# Patient Record
Sex: Female | Born: 1935
Health system: Southern US, Community
[De-identification: ages and names within clinical notes are randomized; demographics above are authoritative.]

## PROBLEM LIST (undated history)

## (undated) DIAGNOSIS — I10 Essential (primary) hypertension: Secondary | ICD-10-CM

## (undated) DIAGNOSIS — R32 Unspecified urinary incontinence: Secondary | ICD-10-CM

## (undated) DIAGNOSIS — C9110 Chronic lymphocytic leukemia of B-cell type not having achieved remission: Secondary | ICD-10-CM

## (undated) DIAGNOSIS — N183 Chronic kidney disease, stage 3 unspecified: Secondary | ICD-10-CM

## (undated) DIAGNOSIS — IMO0002 Reserved for concepts with insufficient information to code with codable children: Secondary | ICD-10-CM

## (undated) DIAGNOSIS — K08109 Complete loss of teeth, unspecified cause, unspecified class: Secondary | ICD-10-CM

## (undated) DIAGNOSIS — Z7901 Long term (current) use of anticoagulants: Secondary | ICD-10-CM

## (undated) DIAGNOSIS — M1990 Unspecified osteoarthritis, unspecified site: Secondary | ICD-10-CM

## (undated) DIAGNOSIS — N816 Rectocele: Secondary | ICD-10-CM

## (undated) DIAGNOSIS — Z8711 Personal history of peptic ulcer disease: Secondary | ICD-10-CM

## (undated) DIAGNOSIS — N952 Postmenopausal atrophic vaginitis: Secondary | ICD-10-CM

## (undated) DIAGNOSIS — Z972 Presence of dental prosthetic device (complete) (partial): Secondary | ICD-10-CM

## (undated) HISTORY — DX: Unspecified osteoarthritis, unspecified site: M19.90

## (undated) HISTORY — DX: Unspecified urinary incontinence: R32

## (undated) HISTORY — DX: Chronic lymphocytic leukemia of B-cell type not having achieved remission: C91.10

## (undated) HISTORY — PX: APPENDECTOMY: SHX54

## (undated) HISTORY — PX: TONSILLECTOMY AND ADENOIDECTOMY: SUR1326

## (undated) HISTORY — DX: Essential (primary) hypertension: I10

## (undated) HISTORY — DX: Reserved for concepts with insufficient information to code with codable children: IMO0002

---

## 2007-09-22 DIAGNOSIS — D75839 Thrombocytosis, unspecified: Secondary | ICD-10-CM

## 2007-09-22 DIAGNOSIS — D72820 Lymphocytosis (symptomatic): Secondary | ICD-10-CM

## 2007-09-22 HISTORY — DX: Lymphocytosis (symptomatic): D72.820

## 2007-09-22 HISTORY — DX: Thrombocytosis, unspecified: D75.839

## 2011-10-12 DIAGNOSIS — C9110 Chronic lymphocytic leukemia of B-cell type not having achieved remission: Secondary | ICD-10-CM | POA: Diagnosis not present

## 2012-02-01 DIAGNOSIS — C9110 Chronic lymphocytic leukemia of B-cell type not having achieved remission: Secondary | ICD-10-CM | POA: Diagnosis not present

## 2012-07-28 DIAGNOSIS — Z23 Encounter for immunization: Secondary | ICD-10-CM | POA: Diagnosis not present

## 2012-10-21 ENCOUNTER — Ambulatory Visit: Payer: Self-pay | Admitting: Family

## 2012-11-07 ENCOUNTER — Ambulatory Visit (INDEPENDENT_AMBULATORY_CARE_PROVIDER_SITE_OTHER): Payer: Medicare Other | Admitting: Family

## 2012-11-07 ENCOUNTER — Encounter: Payer: Self-pay | Admitting: Family

## 2012-11-07 VITALS — BP 140/90 | HR 83 | Ht 62.0 in | Wt 193.0 lb

## 2012-11-07 DIAGNOSIS — I10 Essential (primary) hypertension: Secondary | ICD-10-CM | POA: Diagnosis not present

## 2012-11-07 DIAGNOSIS — E669 Obesity, unspecified: Secondary | ICD-10-CM

## 2012-11-07 LAB — HEPATIC FUNCTION PANEL
Bilirubin, Direct: 0.1 mg/dL (ref 0.0–0.3)
Total Bilirubin: 1 mg/dL (ref 0.3–1.2)
Total Protein: 6.9 g/dL (ref 6.0–8.3)

## 2012-11-07 LAB — BASIC METABOLIC PANEL
Calcium: 10 mg/dL (ref 8.4–10.5)
Creatinine, Ser: 1 mg/dL (ref 0.4–1.2)
GFR: 56.79 mL/min — ABNORMAL LOW (ref >60.00)
Sodium: 142 mEq/L (ref 135–145)

## 2012-11-07 LAB — CBC WITH DIFFERENTIAL/PLATELET
Basophils Absolute: 0 10*3/uL (ref 0.0–0.1)
Eosinophils Absolute: 0.1 10*3/uL (ref 0.0–0.7)
HCT: 42 % (ref 36.0–46.0)
Hemoglobin: 14 g/dL (ref 12.0–15.0)
Lymphs Abs: 20 10*3/uL — ABNORMAL HIGH (ref 0.7–4.0)
MCHC: 33.3 g/dL (ref 30.0–36.0)
Monocytes Absolute: 1 10*3/uL (ref 0.1–1.0)
Neutro Abs: 5.4 10*3/uL (ref 1.4–7.7)
Platelets: 109 10*3/uL — ABNORMAL LOW (ref 150.0–400.0)
RDW: 14.4 % (ref 11.5–14.6)

## 2012-11-07 MED ORDER — ATENOLOL 50 MG PO TABS: 50.0000 mg | ORAL_TABLET | Freq: Every day | ORAL | Status: DC

## 2012-11-07 MED ORDER — HYDROCHLOROTHIAZIDE 25 MG PO TABS: 25.0000 mg | ORAL_TABLET | Freq: Every day | ORAL | Status: DC

## 2012-11-07 MED ORDER — LOSARTAN POTASSIUM 100 MG PO TABS: 100.0000 mg | ORAL_TABLET | Freq: Every day | ORAL | Status: DC

## 2012-11-07 NOTE — Progress Notes (Signed)
  Subjective:    Patient ID: Maria Lowe, female    DOB: 04-Feb-1931, 79 y.o.   MRN: [id number]  HPI 79 year old, female, nonsmoker new patient to the practice is in to be established. She is originally from [place]. She's relocating from [place] to [place] to be with her family. She has a history of hypertension and is currently stable on medications. She has not had a mammogram in about 3 years and is declining a mammogram today. Last colonoscopy was 5 years ago and at this point it's been about 2-1/2 years. She walks daily for exercise. Denies any concerns.   Review of Systems  Constitutional: Negative.   HENT: Negative.   Respiratory: Negative.   Cardiovascular: Negative.   Gastrointestinal: Negative.   Endocrine: Negative.   Genitourinary: Negative.   Musculoskeletal: Negative.   Allergic/Immunologic: Negative.   Neurological: Negative.   Hematological: Negative.   Psychiatric/Behavioral: Negative.    Past Medical History  Diagnosis Date  . Ulcer   . Arthritis   . Hypertension     History   Social History  . Marital Status: Married    Spouse Name: N/A    Number of Children: N/A  . Years of Education: N/A   Occupational History  . Not on file.   Social History Main Topics  . Smoking status: Never Smoker   . Smokeless tobacco: Not on file  . Alcohol Use: Yes  . Drug Use: No  . Sexually Active: Not on file   Other Topics Concern  . Not on file   Social History Narrative  . No narrative on file    Past Surgical History  Procedure Laterality Date  . Tonsillectomy and adenoidectomy    . Appendectomy      No family history on file.  Allergies not on file  No current outpatient prescriptions on file prior to visit.   No current facility-administered medications on file prior to visit.    BP 140/90  Pulse 83  Ht 5\' 2"  (1.575 m)  Wt 193 lb (87.544 kg)  BMI 35.29 kg/m2  SpO2 98%chart    Objective:   Physical Exam  Constitutional: She is  oriented to person, place, and time. She appears well-developed and well-nourished.  HENT:  Right Ear: External ear normal.  Left Ear: External ear normal.  Nose: Nose normal.  Mouth/Throat: Oropharynx is clear and moist.  Neck: Normal range of motion.  Cardiovascular: Normal rate, regular rhythm and normal heart sounds.   Pulmonary/Chest: Effort normal and breath sounds normal.  Abdominal: Soft. Bowel sounds are normal.  Musculoskeletal: Normal range of motion.  Neurological: She is alert and oriented to person, place, and time. She has normal reflexes.  Skin: Skin is warm and dry.  Psychiatric: She has a normal mood and affect.          Assessment & Plan:  Assessment:  1. Hypertension-new problem workup 2. Obesity  Plan: Lab sent to include BMP, LFTs, CBC will notify patient pending results. Encouraged healthy diet and exercise. Low-sodium. Advised patient to call the office with any questions or concerns. Prescriptions renewed. Will recheck patient in 6 months and sooner as needed. Advised mammogram, patient declined.

## 2012-11-07 NOTE — Patient Instructions (Addendum)

## 2013-04-06 ENCOUNTER — Other Ambulatory Visit: Payer: Self-pay

## 2013-04-06 MED ORDER — ATENOLOL 50 MG PO TABS: 50.0000 mg | ORAL_TABLET | Freq: Every day | ORAL | Status: DC

## 2013-05-08 ENCOUNTER — Encounter: Payer: Self-pay | Admitting: Family

## 2013-05-08 ENCOUNTER — Ambulatory Visit (INDEPENDENT_AMBULATORY_CARE_PROVIDER_SITE_OTHER): Payer: Medicare Other | Admitting: Family

## 2013-05-08 ENCOUNTER — Ambulatory Visit: Payer: Medicare Other

## 2013-05-08 VITALS — BP 138/80 | HR 74 | Wt 199.0 lb

## 2013-05-08 DIAGNOSIS — D72829 Elevated white blood cell count, unspecified: Secondary | ICD-10-CM

## 2013-05-08 DIAGNOSIS — I10 Essential (primary) hypertension: Secondary | ICD-10-CM

## 2013-05-08 DIAGNOSIS — R7309 Other abnormal glucose: Secondary | ICD-10-CM | POA: Diagnosis not present

## 2013-05-08 DIAGNOSIS — E669 Obesity, unspecified: Secondary | ICD-10-CM | POA: Diagnosis not present

## 2013-05-08 DIAGNOSIS — R739 Hyperglycemia, unspecified: Secondary | ICD-10-CM | POA: Insufficient documentation

## 2013-05-08 LAB — BASIC METABOLIC PANEL
BUN: 25 mg/dL — ABNORMAL HIGH (ref 6–23)
CO2: 28 mEq/L (ref 19–32)
Chloride: 99 mEq/L (ref 96–112)
Glucose, Bld: 117 mg/dL — ABNORMAL HIGH (ref 70–99)
Potassium: 4.4 mEq/L (ref 3.5–5.1)

## 2013-05-08 LAB — CBC WITH DIFFERENTIAL/PLATELET
Eosinophils Absolute: 0.1 10*3/uL (ref 0.0–0.7)
MCHC: 33.1 g/dL (ref 30.0–36.0)
MCV: 90.7 fl (ref 78.0–100.0)
Monocytes Absolute: 0.7 10*3/uL (ref 0.1–1.0)
Neutrophils Relative %: 25.7 % — ABNORMAL LOW (ref 43.0–77.0)
Platelets: 101 10*3/uL — ABNORMAL LOW (ref 150.0–400.0)

## 2013-05-08 LAB — HEMOGLOBIN A1C: Hgb A1c MFr Bld: 6.1 % (ref 4.6–6.5)

## 2013-05-08 MED ORDER — LOSARTAN POTASSIUM-HCTZ 100-25 MG PO TABS: 1.0000 | ORAL_TABLET | Freq: Every day | ORAL | Status: DC

## 2013-05-08 NOTE — Patient Instructions (Signed)

## 2013-05-08 NOTE — Progress Notes (Signed)
  Subjective:    Patient ID: Maria Lowe, female    DOB: 12/07/1930, 79 y.o.   MRN: [id number]  HPI 79 year old female, nonsmoker is in for recheck of hypertension and obesity. She reports she is doing very well. Denies any concerns. Currently taking hydrochlorothiazide 25 mg once daily and Cozaar 100 mg once daily. She's not routinely exercising.   Review of Systems  Constitutional: Negative.   HENT: Negative.   Respiratory: Negative.   Cardiovascular: Negative.   Gastrointestinal: Negative.   Endocrine: Negative.   Genitourinary: Negative.   Musculoskeletal: Negative.   Skin: Negative.   Neurological: Negative.   Hematological: Negative.   Psychiatric/Behavioral: Negative.    Past Medical History  Diagnosis Date  . Ulcer   . Arthritis   . Hypertension     History   Social History  . Marital Status: Married    Spouse Name: N/A    Number of Children: N/A  . Years of Education: N/A   Occupational History  . Not on file.   Social History Main Topics  . Smoking status: Never Smoker   . Smokeless tobacco: Not on file  . Alcohol Use: Yes  . Drug Use: No  . Sexual Activity: Not on file   Other Topics Concern  . Not on file   Social History Narrative  . No narrative on file    Past Surgical History  Procedure Laterality Date  . Tonsillectomy and adenoidectomy    . Appendectomy      No family history on file.  No Known Allergies  Current Outpatient Prescriptions on File Prior to Visit  Medication Sig Dispense Refill  . atenolol (TENORMIN) 50 MG tablet Take 1 tablet (50 mg total) by mouth daily.  90 tablet  0   No current facility-administered medications on file prior to visit.    BP 138/80  Pulse 74  Wt 199 lb (90.266 kg)  BMI 36.39 kg/m2chart    Objective:   Physical Exam  Constitutional: She is oriented to person, place, and time. She appears well-developed and well-nourished.  HENT:  Right Ear: External ear normal.  Left Ear: External ear  normal.  Nose: Nose normal.  Mouth/Throat: Oropharynx is clear and moist.  Neck: Normal range of motion. Neck supple. No thyromegaly present.  Cardiovascular: Normal rate, regular rhythm and normal heart sounds.   Recheck blood pressure: 130/84  Pulmonary/Chest: Effort normal and breath sounds normal.  Abdominal: Soft. Bowel sounds are normal.  Musculoskeletal: Normal range of motion.  Neurological: She is alert and oriented to person, place, and time.  Skin: Skin is warm and dry.  Psychiatric: She has a normal mood and affect.          Assessment & Plan:  Assessment: 1. Hypertension 2. Obesity 3. Hyperglycemia  Plan: Pap sent to include BMP, LFTs and CBC Will notify patient of results. A1c, sent also bc blood glucose was elevated last OV. Encouraged healthy diet, exercise, monthly self breast exams. We'll follow the patient in 6 months and sooner as needed.

## 2013-05-09 ENCOUNTER — Telehealth: Payer: Self-pay | Admitting: Hematology and Oncology

## 2013-05-09 ENCOUNTER — Other Ambulatory Visit: Payer: Self-pay | Admitting: Family

## 2013-05-09 DIAGNOSIS — D72829 Elevated white blood cell count, unspecified: Secondary | ICD-10-CM

## 2013-05-09 LAB — PATHOLOGIST SMEAR REVIEW

## 2013-05-09 NOTE — Telephone Encounter (Signed)
S/W PT HUSBAND IN RE TO NP APPT 09/05 @ 3 W/DR. VICTOR REFERRING PADONDA CAMPBELL DX- LEUKOCYTOSIS WELCOME PACKET MAILED.

## 2013-05-10 ENCOUNTER — Telehealth: Payer: Self-pay

## 2013-05-10 NOTE — Telephone Encounter (Signed)
C/D 05/10/13 for appt. 05/26/13

## 2013-05-26 ENCOUNTER — Encounter: Payer: Self-pay | Admitting: Hematology and Oncology

## 2013-05-26 ENCOUNTER — Ambulatory Visit (HOSPITAL_BASED_OUTPATIENT_CLINIC_OR_DEPARTMENT_OTHER): Payer: Medicare Other | Admitting: Hematology and Oncology

## 2013-05-26 ENCOUNTER — Ambulatory Visit: Payer: Medicare Other

## 2013-05-26 VITALS — BP 185/71 | HR 71 | Temp 97.5°F | Resp 18 | Ht 62.0 in | Wt 193.8 lb

## 2013-05-26 DIAGNOSIS — D72829 Elevated white blood cell count, unspecified: Secondary | ICD-10-CM

## 2013-05-26 DIAGNOSIS — C9110 Chronic lymphocytic leukemia of B-cell type not having achieved remission: Secondary | ICD-10-CM

## 2013-05-26 NOTE — Progress Notes (Signed)
Checked in new pt with no financial concerns. °

## 2013-05-30 ENCOUNTER — Telehealth: Payer: Self-pay | Admitting: Hematology and Oncology

## 2013-06-04 ENCOUNTER — Other Ambulatory Visit: Payer: Self-pay | Admitting: Family

## 2013-06-06 NOTE — Progress Notes (Signed)
IDNaydeen Speirs Lundy OB: [birth year]  MR#: [id number]  WNU#:[id number]  [facility]  Telephone:([phone] [phone]   INITIAL HEMATOLOGY CONSULTATION  PCP: CAMPBELL, PADONDA BOYD, FNP   Reason for Referral: CLL for 6 years.   PAST THERAPY:  None  CURRENT THERAPY: Observation.  HISTORY OF PRESENT ILLNESS: The patient is a 79 y.o. female with CLL for 6 years was sent to hematology to establish regular follow up with [place].She recently relocated from [facility] to [facility]. She did not have history of treatment for CLL. She is on observation. She is doing well and has no complaints. Her appetite is good and weight is stable. The patient denied fever, chills, night sweats, change in appetite or weight. She denied headaches, double vision, blurry vision, nasal congestion, nasal discharge, hearing problems, odynophagia or dysphagia. No chest pain, palpitations, dyspnea, cough, abdominal pain, nausea, vomiting, diarrhea, constipation, hematochezia. The patient denied dysuria, nocturia, polyuria, hematuria, myalgia, numbness, tingling, psychiatric problems.  Review of Systems  Constitutional: Negative for fever, chills, weight loss, malaise/fatigue and diaphoresis.  HENT: Negative for hearing loss, ear pain, nosebleeds, congestion, sore throat, neck pain and tinnitus.   Eyes: Negative for blurred vision, double vision, photophobia and pain.  Respiratory: Negative for cough, hemoptysis, sputum production, shortness of breath and wheezing.   Cardiovascular: Negative for chest pain, palpitations, orthopnea, claudication, leg swelling and PND.  Gastrointestinal: Negative for heartburn, nausea, vomiting, abdominal pain, diarrhea, constipation, blood in stool and melena.  Genitourinary: Negative for dysuria, urgency, frequency, hematuria and flank pain.  Musculoskeletal: Negative for myalgias, back pain, joint pain and falls.  Skin: Negative for itching and rash.  Neurological: Negative for  dizziness, tingling, tremors, sensory change, speech change, focal weakness, seizures, loss of consciousness, weakness and headaches.  Endo/Heme/Allergies: Does not bruise/bleed easily.  Psychiatric/Behavioral: Negative.     PAST MEDICAL HISTORY: Past Medical History  Diagnosis Date  . Ulcer   . Arthritis   . Hypertension     PAST SURGICAL HISTORY: Past Surgical History  Procedure Laterality Date  . Tonsillectomy and adenoidectomy    . Appendectomy      FAMILY HISTORY No family history on file. HEALTH MAINTENANCE: History  Substance Use Topics  . Smoking status: Never Smoker   . Smokeless tobacco: Not on file  . Alcohol Use: Yes    No Known Allergies  Current Outpatient Prescriptions  Medication Sig Dispense Refill  . atenolol (TENORMIN) 50 MG tablet Take 1 tablet (50 mg total) by mouth daily.  90 tablet  0  . losartan-hydrochlorothiazide (HYZAAR) 100-25 MG per tablet Take 1 tablet by mouth daily.  90 tablet  3  . hydrochlorothiazide (HYDRODIURIL) 25 MG tablet Take one tablet by mouth one time daily  90 tablet  0  . losartan (COZAAR) 100 MG tablet Take one tablet by mouth one time daily  90 tablet  0   No current facility-administered medications for this visit.    OBJECTIVE: Filed Vitals:   05/26/13 1525  BP: 185/71  Pulse: 71  Temp: 97.5 F (36.4 C)  Resp: 18     Body mass index is 35.44 kg/(m^2).    ECOG FS:0  PHYSICAL EXAMINATION:  HEENT: Sclerae anicteric.  Conjunctivae were pink. Pupils round and reactive bilaterally. Oral mucosa is moist without ulceration or thrush. No occipital, submandibular, cervical, supraclavicular or axillar adenopathy. Lungs: clear to auscultation without wheezes. No rales or rhonchi. Heart: regular rate and rhythm. No murmur, gallop or rubs. Abdomen: soft, non tender. No guarding  or rebound tenderness. Bowel sounds are present. No palpable hepatosplenomegaly. MSK: no focal spinal tenderness. Extremities: No clubbing or  cyanosis.No calf tenderness to palpitation, no peripheral edema.  Skin exam was without ecchymosis, petechiae. Neuro: non-focal, alert and oriented to time, person and place, appropriate affect  LAB RESULTS:  CMP     Component Value Date/Time   NA 139 05/08/2013 1000   K 4.4 05/08/2013 1000   CL 99 05/08/2013 1000   CO2 28 05/08/2013 1000   GLUCOSE 117* 05/08/2013 1000   BUN 25* 05/08/2013 1000   CREATININE 1.0 05/08/2013 1000   CALCIUM 9.7 05/08/2013 1000   PROT 6.9 11/07/2012 0954   ALBUMIN 4.6 11/07/2012 0954   AST 22 11/07/2012 0954   ALT 18 11/07/2012 0954   ALKPHOS 65 11/07/2012 0954   BILITOT 1.0 11/07/2012 0954    I No results found for this basename: SPEP, UPEP,  kappa and lambda light chains    Lab Results  Component Value Date   WBC 25.2* 05/08/2013   NEUTROABS 6.5 05/08/2013   HGB 13.6 05/08/2013   HCT 41.3 05/08/2013   MCV 90.7 05/08/2013   PLT 101.0* 05/08/2013      Chemistry      Component Value Date/Time   NA 139 05/08/2013 1000   K 4.4 05/08/2013 1000   CL 99 05/08/2013 1000   CO2 28 05/08/2013 1000   BUN 25* 05/08/2013 1000   CREATININE 1.0 05/08/2013 1000      Component Value Date/Time   CALCIUM 9.7 05/08/2013 1000   ALKPHOS 65 11/07/2012 0954   AST 22 11/07/2012 0954   ALT 18 11/07/2012 0954   BILITOT 1.0 11/07/2012 0954       No results found for this basename: LABCA2    No components found with this basename: LABCA125    No results found for this basename: INR,  in the last 168 hours  Urinalysis No results found for this basename: colorurine, appearanceur, labspec, phurine, glucoseu, hgbur, bilirubinur, ketonesur, proteinur, urobilinogen, nitrite, leukocytesur    STUDIES: No results found.  ASSESSMENT AND PLAN: 1. . CLL  On observation. Patient does not have symptoms. No anemia.. Stable grade 1 Thrombocytopenia  No indications for treatment. We will continue to observe. CBC every 3 months. .Follow up in 6 months.      Myra Rude,  MD   06/06/2013 5:19 AM

## 2013-06-30 DIAGNOSIS — Z23 Encounter for immunization: Secondary | ICD-10-CM | POA: Diagnosis not present

## 2013-07-10 ENCOUNTER — Other Ambulatory Visit: Payer: Self-pay | Admitting: Family

## 2013-07-24 ENCOUNTER — Other Ambulatory Visit: Payer: Medicare Other | Admitting: Lab

## 2013-07-31 ENCOUNTER — Encounter: Payer: Self-pay | Admitting: Hematology and Oncology

## 2013-08-14 ENCOUNTER — Other Ambulatory Visit (INDEPENDENT_AMBULATORY_CARE_PROVIDER_SITE_OTHER): Payer: Medicare Other

## 2013-08-14 DIAGNOSIS — D72829 Elevated white blood cell count, unspecified: Secondary | ICD-10-CM

## 2013-08-14 LAB — CBC WITH DIFFERENTIAL/PLATELET
Basophils Absolute: 0 10*3/uL (ref 0.0–0.1)
Basophils Relative: 0.2 % (ref 0.0–3.0)
Eosinophils Absolute: 0.1 10*3/uL (ref 0.0–0.7)
Eosinophils Relative: 0.2 % (ref 0.0–5.0)
HCT: 39.5 % (ref 36.0–46.0)
Hemoglobin: 13.1 g/dL (ref 12.0–15.0)
Lymphocytes Relative: 72.4 % — ABNORMAL HIGH (ref 12.0–46.0)
Lymphs Abs: 16.2 10*3/uL — ABNORMAL HIGH (ref 0.7–4.0)
MCHC: 33 g/dL (ref 30.0–36.0)
MCV: 89 fl (ref 78.0–100.0)
Monocytes Relative: 2.9 % — ABNORMAL LOW (ref 3.0–12.0)
Platelets: 115 10*3/uL — ABNORMAL LOW (ref 150.0–400.0)
RDW: 15 % — ABNORMAL HIGH (ref 11.5–14.6)
WBC: 22.1 10*3/uL (ref 4.5–10.5)

## 2013-08-14 NOTE — Addendum Note (Signed)
Addended by: Rita Ohara R on: 08/14/2013 01:39 PM   Modules accepted: Orders

## 2013-09-25 ENCOUNTER — Other Ambulatory Visit: Payer: Medicare Other | Admitting: Lab

## 2013-09-30 ENCOUNTER — Other Ambulatory Visit: Payer: Self-pay | Admitting: Family

## 2013-11-27 ENCOUNTER — Other Ambulatory Visit (HOSPITAL_COMMUNITY)
Admission: RE | Admit: 2013-11-27 | Discharge: 2013-11-27 | Disposition: A | Discharge status: Home / Self Care | Payer: Medicare Other | Source: Ambulatory Visit | Attending: Hematology and Oncology | Admitting: Hematology and Oncology

## 2013-11-27 ENCOUNTER — Encounter: Payer: Self-pay | Admitting: Hematology and Oncology

## 2013-11-27 ENCOUNTER — Ambulatory Visit (HOSPITAL_BASED_OUTPATIENT_CLINIC_OR_DEPARTMENT_OTHER): Payer: Medicare Other | Admitting: Hematology and Oncology

## 2013-11-27 ENCOUNTER — Other Ambulatory Visit (HOSPITAL_BASED_OUTPATIENT_CLINIC_OR_DEPARTMENT_OTHER): Payer: Medicare Other

## 2013-11-27 ENCOUNTER — Other Ambulatory Visit: Payer: Self-pay | Admitting: Hematology and Oncology

## 2013-11-27 VITALS — BP 183/84 | HR 96 | Temp 97.6°F | Resp 18 | Ht 62.0 in | Wt 190.6 lb

## 2013-11-27 DIAGNOSIS — C9110 Chronic lymphocytic leukemia of B-cell type not having achieved remission: Secondary | ICD-10-CM | POA: Insufficient documentation

## 2013-11-27 DIAGNOSIS — D696 Thrombocytopenia, unspecified: Secondary | ICD-10-CM

## 2013-11-27 HISTORY — DX: Chronic lymphocytic leukemia of B-cell type not having achieved remission: C91.10

## 2013-11-27 LAB — CBC WITH DIFFERENTIAL/PLATELET
BASO%: 0.1 % (ref 0.0–2.0)
Basophils Absolute: 0 10*3/uL (ref 0.0–0.1)
EOS%: 0.4 % (ref 0.0–7.0)
Eosinophils Absolute: 0.1 10*3/uL (ref 0.0–0.5)
HCT: 41.6 % (ref 34.8–46.6)
HGB: 13.4 g/dL (ref 11.6–15.9)
LYMPH%: 77.3 % — AB (ref 14.0–49.7)
MCH: 29.3 pg (ref 25.1–34.0)
MCHC: 32.2 g/dL (ref 31.5–36.0)
MCV: 90.9 fL (ref 79.5–101.0)
MONO#: 0.9 10*3/uL (ref 0.1–0.9)
MONO%: 3.1 % (ref 0.0–14.0)
NEUT#: 5.6 10*3/uL (ref 1.5–6.5)
NEUT%: 19.1 % — ABNORMAL LOW (ref 38.4–76.8)
PLATELETS: 109 10*3/uL — AB (ref 145–400)
RBC: 4.58 10*6/uL (ref 3.70–5.45)
RDW: 14.8 % — ABNORMAL HIGH (ref 11.2–14.5)
WBC: 29.1 10*3/uL — AB (ref 3.9–10.3)
lymph#: 22.5 10*3/uL — ABNORMAL HIGH (ref 0.9–3.3)

## 2013-11-27 LAB — COMPREHENSIVE METABOLIC PANEL (CC13)
ALBUMIN: 4.2 g/dL (ref 3.5–5.0)
ALK PHOS: 88 U/L (ref 40–150)
ALT: 37 U/L (ref 0–55)
AST: 48 U/L — AB (ref 5–34)
Anion Gap: 10 mEq/L (ref 3–11)
BUN: 22.2 mg/dL (ref 7.0–26.0)
CO2: 30 mEq/L — ABNORMAL HIGH (ref 22–29)
Calcium: 10.1 mg/dL (ref 8.4–10.4)
Chloride: 104 mEq/L (ref 98–109)
Creatinine: 0.8 mg/dL (ref 0.6–1.1)
Glucose: 123 mg/dl (ref 70–140)
POTASSIUM: 3.8 meq/L (ref 3.5–5.1)
Sodium: 144 mEq/L (ref 136–145)
Total Bilirubin: 0.88 mg/dL (ref 0.20–1.20)
Total Protein: 6.6 g/dL (ref 6.4–8.3)

## 2013-11-27 LAB — TECHNOLOGIST REVIEW

## 2013-11-27 LAB — LACTATE DEHYDROGENASE (CC13): LDH: 179 U/L (ref 125–245)

## 2013-11-27 NOTE — Progress Notes (Signed)
[place] OFFICE PROGRESS NOTE  Patient Care Team: Timoteo Gaul, FNP as PCP - General (Family Medicine) Heath Lark, MD as Consulting Physician (Hematology and Oncology)  DIAGNOSIS: CLL, [place] stage 0 with thrombocytopenia  SUMMARY OF ONCOLOGIC HISTORY: This patient was diagnosed with CLL around 2009 from abnormal leukocytosis. She is asymptomatic. She is being observed.  INTERVAL HISTORY: Maria Lowe 80 y.o. female returns for further followup. She denies any recent fever, chills, night sweats or abnormal weight loss From thrombocytopenia, she has no new symptoms. The patient denies any recent signs or symptoms of bleeding such as spontaneous epistaxis, hematuria or hematochezia.  I have reviewed the past medical history, past surgical history, social history and family history with the patient and they are unchanged from previous note.  ALLERGIES:  has No Known Allergies.  MEDICATIONS:  Current Outpatient Prescriptions  Medication Sig Dispense Refill  . atenolol (TENORMIN) 50 MG tablet TAKE ONE TABLET BY MOUTH ONE TIME DAILY   90 tablet  0  . losartan-hydrochlorothiazide (HYZAAR) 100-25 MG per tablet Take 1 tablet by mouth daily.  90 tablet  3   No current facility-administered medications for this visit.    REVIEW OF SYSTEMS:   Constitutional: Denies fevers, chills or abnormal weight loss Eyes: Denies blurriness of vision Ears, nose, mouth, throat, and face: Denies mucositis or sore throat Respiratory: Denies cough, dyspnea or wheezes Cardiovascular: Denies palpitation, chest discomfort or lower extremity swelling Gastrointestinal:  Denies nausea, heartburn or change in bowel habits Skin: Denies abnormal skin rashes Lymphatics: Denies new lymphadenopathy Neurological:Denies numbness, tingling or new weaknesses Behavioral/Psych: Mood is stable, no new changes  All other systems were reviewed with the patient and are negative.  PHYSICAL  EXAMINATION: ECOG PERFORMANCE STATUS: 0 - Asymptomatic  Filed Vitals:   11/27/13 0906  BP: 183/84  Pulse: 96  Temp: 97.6 F (36.4 C)  Resp: 18   Filed Weights   11/27/13 0906  Weight: 190 lb 9.6 oz (86.456 kg)    GENERAL:alert, no distress and comfortable SKIN: skin color, texture, turgor are normal, no rashes or significant lesions EYES: normal, Conjunctiva are pink and non-injected, sclera clear OROPHARYNX:no exudate, no erythema and lips, buccal mucosa, and tongue normal  NECK: supple, thyroid normal size, non-tender, without nodularity LYMPH:  no palpable lymphadenopathy in the cervical, axillary or inguinal LUNGS: clear to auscultation and percussion with normal breathing effort HEART: regular rate & rhythm and no murmurs and no lower extremity edema ABDOMEN:abdomen soft, non-tender and normal bowel sounds Musculoskeletal:no cyanosis of digits and no clubbing  NEURO: alert & oriented x 3 with fluent speech, no focal motor/sensory deficits  LABORATORY DATA:  I have reviewed the data as listed    Component Value Date/Time   NA 144 11/27/2013 0836   NA 139 05/08/2013 1000   K 3.8 11/27/2013 0836   K 4.4 05/08/2013 1000   CL 99 05/08/2013 1000   CO2 30* 11/27/2013 0836   CO2 28 05/08/2013 1000   GLUCOSE 123 11/27/2013 0836   GLUCOSE 117* 05/08/2013 1000   BUN 22.2 11/27/2013 0836   BUN 25* 05/08/2013 1000   CREATININE 0.8 11/27/2013 0836   CREATININE 1.0 05/08/2013 1000   CALCIUM 10.1 11/27/2013 0836   CALCIUM 9.7 05/08/2013 1000   PROT 6.6 11/27/2013 0836   PROT 6.9 11/07/2012 0954   ALBUMIN 4.2 11/27/2013 0836   ALBUMIN 4.6 11/07/2012 0954   AST 48* 11/27/2013 0836   AST 22 11/07/2012 0954   ALT 37  11/27/2013 0836   ALT 18 11/07/2012 0954   ALKPHOS 88 11/27/2013 0836   ALKPHOS 65 11/07/2012 0954   BILITOT 0.88 11/27/2013 0836   BILITOT 1.0 11/07/2012 0954    No results found for this basename: SPEP, UPEP,  kappa and lambda light chains    Lab Results  Component Value Date   WBC 29.1*  11/27/2013   NEUTROABS 5.6 11/27/2013   HGB 13.4 11/27/2013   HCT 41.6 11/27/2013   MCV 90.9 11/27/2013   PLT 109* 11/27/2013      Chemistry      Component Value Date/Time   NA 144 11/27/2013 0836   NA 139 05/08/2013 1000   K 3.8 11/27/2013 0836   K 4.4 05/08/2013 1000   CL 99 05/08/2013 1000   CO2 30* 11/27/2013 0836   CO2 28 05/08/2013 1000   BUN 22.2 11/27/2013 0836   BUN 25* 05/08/2013 1000   CREATININE 0.8 11/27/2013 0836   CREATININE 1.0 05/08/2013 1000      Component Value Date/Time   CALCIUM 10.1 11/27/2013 0836   CALCIUM 9.7 05/08/2013 1000   ALKPHOS 88 11/27/2013 0836   ALKPHOS 65 11/07/2012 0954   AST 48* 11/27/2013 0836   AST 22 11/07/2012 0954   ALT 37 11/27/2013 0836   ALT 18 11/07/2012 0954   BILITOT 0.88 11/27/2013 0836   BILITOT 1.0 11/07/2012 0954     ASSESSMENT & PLAN:  #1 CLL FISH analysis is pending. The patient has no symptoms. Although she has mild thrombocytopenia, she does not have anemia or palpable lymphadenopathy. Clinically she is at [place] stage 0. I recommend yearly followup #2 mild thrombocytopenia This is likely due to ITP like picture. She has no symptoms. I recommend observation only #3 preventive care I recommend yearly influenza vaccination. She need to renew pneumonia vaccination every 5 years.  Orders Placed This Encounter  Procedures  . CBC with Differential    Standing Status: Future     Number of Occurrences:      Standing Expiration Date: 11/27/2014   All questions were answered. The patient knows to call the clinic with any problems, questions or concerns. No barriers to learning was detected. I spent 15 minutes counseling the patient face to face. The total time spent in the appointment was 20 minutes and more than 50% was on counseling and review of test results     [facility], [place], MD 11/27/2013 10:15 AM

## 2013-11-28 LAB — IGG, IGA, IGM
IGA: 36 mg/dL — AB (ref 69–380)
IGG (IMMUNOGLOBIN G), SERUM: 279 mg/dL — AB (ref 690–1700)
IGM, SERUM: 10 mg/dL — AB (ref 52–322)

## 2013-11-29 ENCOUNTER — Telehealth: Payer: Self-pay | Admitting: Hematology and Oncology

## 2013-11-29 NOTE — Telephone Encounter (Signed)
, °

## 2013-12-01 LAB — FLOW CYTOMETRY

## 2013-12-01 LAB — TISSUE HYBRIDIZATION TO NCBH

## 2013-12-05 LAB — FISH, PERIPHERAL BLOOD

## 2014-01-10 ENCOUNTER — Other Ambulatory Visit: Payer: Self-pay | Admitting: Family

## 2014-01-11 ENCOUNTER — Ambulatory Visit (INDEPENDENT_AMBULATORY_CARE_PROVIDER_SITE_OTHER): Payer: Medicare Other | Admitting: Family

## 2014-01-11 ENCOUNTER — Encounter: Payer: Self-pay | Admitting: Family

## 2014-01-11 VITALS — BP 154/90 | HR 102 | Temp 97.7°F | Ht 62.0 in | Wt 188.0 lb

## 2014-01-11 DIAGNOSIS — I10 Essential (primary) hypertension: Secondary | ICD-10-CM | POA: Diagnosis not present

## 2014-01-11 DIAGNOSIS — C9110 Chronic lymphocytic leukemia of B-cell type not having achieved remission: Secondary | ICD-10-CM | POA: Diagnosis not present

## 2014-01-11 DIAGNOSIS — IMO0001 Reserved for inherently not codable concepts without codable children: Secondary | ICD-10-CM

## 2014-01-11 MED ORDER — ATENOLOL 100 MG PO TABS: ORAL_TABLET | ORAL | Status: DC

## 2014-01-11 NOTE — Progress Notes (Signed)
Pre visit review using our clinic review tool, if applicable. No additional management support is needed unless otherwise documented below in the visit note. 

## 2014-01-11 NOTE — Progress Notes (Signed)
   Subjective:    Patient ID: Maria Lowe, female    DOB: 07-Sep-1931, 80 y.o.   MRN: [id number]  HPI 80 year old female with a history of CLL is in for a recheck of Hypertension. She reports her blood pressure has been running higher than normal.    Review of Systems  Constitutional: Negative.   HENT: Negative.   Respiratory: Negative.   Cardiovascular: Negative.   Gastrointestinal: Negative.   Endocrine: Negative.   Musculoskeletal: Negative.   Skin: Negative.   Allergic/Immunologic: Negative.   Neurological: Negative.   Psychiatric/Behavioral: Negative.    Past Medical History  Diagnosis Date  . Ulcer   . Arthritis   . Hypertension   . CLL (chronic lymphocytic leukemia) 11/27/2013    History   Social History  . Marital Status: Married    Spouse Name: N/A    Number of Children: N/A  . Years of Education: N/A   Occupational History  . Not on file.   Social History Main Topics  . Smoking status: Never Smoker   . Smokeless tobacco: Never Used  . Alcohol Use: Yes  . Drug Use: No  . Sexual Activity: Not on file   Other Topics Concern  . Not on file   Social History Narrative  . No narrative on file    Past Surgical History  Procedure Laterality Date  . Tonsillectomy and adenoidectomy    . Appendectomy      No family history on file.  No Known Allergies  Current Outpatient Prescriptions on File Prior to Visit  Medication Sig Dispense Refill  . losartan-hydrochlorothiazide (HYZAAR) 100-25 MG per tablet Take 1 tablet by mouth daily.  90 tablet  3   No current facility-administered medications on file prior to visit.    BP 154/90  Pulse 102  Temp(Src) 97.7 F (36.5 C) (Oral)  Ht 5\' 2"  (1.575 m)  Wt 188 lb (85.276 kg)  BMI 34.38 kg/m2  SpO2 96%chart    Objective:   Physical Exam  Constitutional: She is oriented to person, place, and time. She appears well-developed and well-nourished.  HENT:  Right Ear: External ear normal.  Left Ear:  External ear normal.  Nose: Nose normal.  Mouth/Throat: Oropharynx is clear and moist.  Neck: Normal range of motion. Neck supple. No thyromegaly present.  Cardiovascular: Normal rate, regular rhythm and normal heart sounds.   Pulmonary/Chest: Effort normal.  Abdominal: Soft. Bowel sounds are normal.  Musculoskeletal: Normal range of motion.  Neurological: She is alert and oriented to person, place, and time.  Skin: Skin is warm and dry.  Psychiatric: She has a normal mood and affect.          Assessment & Plan:  Rosy was seen today for follow-up.  Diagnoses and associated orders for this visit:  Unspecified essential hypertension  Chronic lymphoblastic leukemia  Other Orders - atenolol (TENORMIN) 100 MG tablet; TAKE ONE TABLET BY MOUTH ONE TIME DAILY   Recheck in 3 weeks to check BP with increased Atenolol dose.

## 2014-01-11 NOTE — Patient Instructions (Signed)
Sodium-Controlled Diet Sodium is a mineral. It is found in many foods. Sodium may be found naturally or added during the making of a food. The most common form of sodium is salt, which is made up of sodium and chloride. Reducing your sodium intake involves changing your eating habits. The following guidelines will help you reduce the sodium in your diet:  Stop using the salt shaker.  Use salt sparingly in cooking and baking.  Substitute with sodium-free seasonings and spices.  Do not use a salt substitute (potassium chloride) without your caregiver's permission.  Include a variety of fresh, unprocessed foods in your diet.  Limit the use of processed and convenience foods that are high in sodium. USE THE FOLLOWING FOODS SPARINGLY: Breads/Starches  Commercial bread stuffing, commercial pancake or waffle mixes, coating mixes. Waffles. Croutons. Prepared (boxed or frozen) potato, rice, or noodle mixes that contain salt or sodium. Salted French fries or hash browns. Salted popcorn, breads, crackers, chips, or snack foods. Vegetables  Vegetables canned with salt or prepared in cream, butter, or cheese sauces. Sauerkraut. Tomato or vegetable juices canned with salt.  Fresh vegetables are allowed if rinsed thoroughly. Fruit  Fruit is okay to eat. Meat and Meat Substitutes  Salted or smoked meats, such as bacon or Canadian bacon, chipped or corned beef, hot dogs, salt pork, luncheon meats, pastrami, ham, or sausage. Canned or smoked fish, poultry, or meat. Processed cheese or cheese spreads, blue or Roquefort cheese. Battered or frozen fish products. Prepared spaghetti sauce. Baked beans. Reuben sandwiches. Salted nuts. Caviar. Milk  Limit buttermilk to 1 cup per week. Soups and Combination Foods  Bouillon cubes, canned or dried soups, broth, consomm. Convenience (frozen or packaged) dinners with more than 600 mg sodium. Pot pies, pizza, Asian food, fast food cheeseburgers, and specialty  sandwiches. Desserts and Sweets  Regular (salted) desserts, pie, commercial fruit snack pies, commercial snack cakes, canned puddings.  Eat desserts and sweets in moderation. Fats and Oils  Gravy mixes or canned gravy. No more than 1 to 2 tbs of salad dressing. Chip dips.  Eat fats and oils in moderation. Beverages  See those listed under the vegetables and milk groups. Condiments  Ketchup, mustard, meat sauces, salsa, regular (salted) and lite soy sauce or mustard. Dill pickles, olives, meat tenderizer. Prepared horseradish or pickle relish. Dutch-processed cocoa. Baking powder or baking soda used medicinally. Worcestershire sauce. "Light" salt. Salt substitute, unless approved by your caregiver. Document Released: 02/27/2002 Document Revised: 11/30/2011 Document Reviewed: 09/30/2009 [employer] Patient Information 2014 [employer].  

## 2014-01-12 ENCOUNTER — Telehealth: Payer: Self-pay | Admitting: Family

## 2014-01-12 NOTE — Telephone Encounter (Signed)
Relevant patient education assigned to patient using Emmi. ° °

## 2014-04-01 ENCOUNTER — Other Ambulatory Visit: Payer: Self-pay | Admitting: Family

## 2014-04-13 ENCOUNTER — Encounter: Payer: Self-pay | Admitting: Family

## 2014-04-13 ENCOUNTER — Ambulatory Visit (INDEPENDENT_AMBULATORY_CARE_PROVIDER_SITE_OTHER): Payer: Medicare Other | Admitting: Family

## 2014-04-13 VITALS — BP 130/76 | HR 72 | Temp 97.9°F | Ht 62.0 in | Wt 185.0 lb

## 2014-04-13 DIAGNOSIS — E669 Obesity, unspecified: Secondary | ICD-10-CM | POA: Diagnosis not present

## 2014-04-13 DIAGNOSIS — I10 Essential (primary) hypertension: Secondary | ICD-10-CM | POA: Diagnosis not present

## 2014-04-13 LAB — BASIC METABOLIC PANEL
BUN: 22 mg/dL (ref 6–23)
CO2: 31 mEq/L (ref 19–32)
CREATININE: 0.9 mg/dL (ref 0.4–1.2)
Calcium: 9.4 mg/dL (ref 8.4–10.5)
Chloride: 102 mEq/L (ref 96–112)
GFR: 62.3 mL/min (ref >60.00)
GLUCOSE: 110 mg/dL — AB (ref 70–99)
Potassium: 4 mEq/L (ref 3.5–5.1)
Sodium: 140 mEq/L (ref 135–145)

## 2014-04-13 NOTE — Patient Instructions (Signed)

## 2014-04-13 NOTE — Progress Notes (Signed)
   Subjective:    Patient ID: Lilia Argue Stockert, female    DOB: [birth year], 80 y.o.   MRN: [id number]  HPI 80 year old [place] female, nonsmoker is in today for a recheck of Hypertension. Denies any concerns. Tolerates medication well. Does not exercise routinely.    Review of Systems  Constitutional: Negative.   Respiratory: Negative.   Cardiovascular: Negative.   Gastrointestinal: Negative.   Endocrine: Negative.   Genitourinary: Negative.   Musculoskeletal: Negative.   Skin: Negative.   Neurological: Negative.   Psychiatric/Behavioral: Negative.    Past Medical History  Diagnosis Date  . Ulcer   . Arthritis   . Hypertension   . CLL (chronic lymphocytic leukemia) 11/27/2013    History   Social History  . Marital Status: Married    Spouse Name: N/A    Number of Children: N/A  . Years of Education: N/A   Occupational History  . Not on file.   Social History Main Topics  . Smoking status: Never Smoker   . Smokeless tobacco: Never Used  . Alcohol Use: Yes  . Drug Use: No  . Sexual Activity: Not on file   Other Topics Concern  . Not on file   Social History Narrative  . No narrative on file    Past Surgical History  Procedure Laterality Date  . Tonsillectomy and adenoidectomy    . Appendectomy      No family history on file.  No Known Allergies  Current Outpatient Prescriptions on File Prior to Visit  Medication Sig Dispense Refill  . atenolol (TENORMIN) 100 MG tablet TAKE ONE TABLET BY MOUTH ONE TIME DAILY. NEED FOLLOW UP APPT.  30 tablet  1  . losartan-hydrochlorothiazide (HYZAAR) 100-25 MG per tablet Take 1 tablet by mouth daily.  90 tablet  3   No current facility-administered medications on file prior to visit.    BP 130/76  Pulse 72  Temp(Src) 97.9 F (36.6 C) (Oral)  Ht 5\' 2"  (1.575 m)  Wt 185 lb (83.915 kg)  BMI 33.83 kg/m2  SpO2 95%chart     Objective:   Physical Exam  Constitutional: She is oriented to person, place, and time. She  appears well-developed and well-nourished.  Neck: Normal range of motion. Neck supple.  Cardiovascular: Normal rate, regular rhythm and normal heart sounds.   Pulmonary/Chest: Effort normal and breath sounds normal.  Abdominal: Soft. Bowel sounds are normal.  Musculoskeletal: Normal range of motion.  Neurological: She is alert and oriented to person, place, and time.  Skin: Skin is warm and dry.  Psychiatric: She has a normal mood and affect.          Assessment & Plan:  Raygan was seen today for follow-up.  Diagnoses and associated orders for this visit:  Unspecified essential hypertension - Basic Metabolic Panel  Obesity, unspecified   Recheck in 6 months and sooner as needed.

## 2014-04-13 NOTE — Progress Notes (Signed)
Pre visit review using our clinic review tool, if applicable. No additional management support is needed unless otherwise documented below in the visit note. 

## 2014-05-31 ENCOUNTER — Other Ambulatory Visit: Payer: Self-pay | Admitting: Family

## 2014-06-03 ENCOUNTER — Other Ambulatory Visit: Payer: Self-pay | Admitting: Family

## 2014-06-13 ENCOUNTER — Ambulatory Visit (INDEPENDENT_AMBULATORY_CARE_PROVIDER_SITE_OTHER): Payer: Medicare Other

## 2014-06-13 DIAGNOSIS — Z23 Encounter for immunization: Secondary | ICD-10-CM

## 2014-09-30 ENCOUNTER — Other Ambulatory Visit: Payer: Self-pay | Admitting: Family

## 2014-10-17 ENCOUNTER — Ambulatory Visit (INDEPENDENT_AMBULATORY_CARE_PROVIDER_SITE_OTHER): Payer: PPO | Admitting: Family

## 2014-10-17 ENCOUNTER — Encounter: Payer: Self-pay | Admitting: Family

## 2014-10-17 VITALS — BP 152/88 | HR 79 | Temp 97.3°F | Ht 62.0 in | Wt 178.4 lb

## 2014-10-17 DIAGNOSIS — I10 Essential (primary) hypertension: Secondary | ICD-10-CM

## 2014-10-17 DIAGNOSIS — E669 Obesity, unspecified: Secondary | ICD-10-CM

## 2014-10-17 DIAGNOSIS — R739 Hyperglycemia, unspecified: Secondary | ICD-10-CM

## 2014-10-17 MED ORDER — AMLODIPINE BESY-BENAZEPRIL HCL 5-40 MG PO CAPS: 1.0000 | ORAL_CAPSULE | Freq: Every day | ORAL | Status: DC

## 2014-10-17 NOTE — Patient Instructions (Addendum)
1. Check A1C and BMP at the cancer center for hyperglycemia and hypertension.   Low-Sodium Eating Plan Sodium raises blood pressure and causes water to be held in the body. Getting less sodium from food will help lower your blood pressure, reduce any swelling, and protect your heart, liver, and kidneys. We get sodium by adding salt (sodium chloride) to food. Most of our sodium comes from canned, boxed, and frozen foods. Restaurant foods, fast foods, and pizza are also very high in sodium. Even if you take medicine to lower your blood pressure or to reduce fluid in your body, getting less sodium from your food is important. WHAT IS MY PLAN? Most people should limit their sodium intake to 2,300 mg a day. Your health care provider recommends that you limit your sodium intake to __________ a day.  WHAT DO I NEED TO KNOW ABOUT THIS EATING PLAN? For the low-sodium eating plan, you will follow these general guidelines:  Choose foods with a % Daily Value for sodium of less than 5% (as listed on the food label).   Use salt-free seasonings or herbs instead of table salt or sea salt.   Check with your health care provider or pharmacist before using salt substitutes.   Eat fresh foods.  Eat more vegetables and fruits.  Limit canned vegetables. If you do use them, rinse them well to decrease the sodium.   Limit cheese to 1 oz (28 g) per day.   Eat lower-sodium products, often labeled as "lower sodium" or "no salt added."  Avoid foods that contain monosodium glutamate (MSG). MSG is sometimes added to [place] food and some canned foods.  Check food labels (Nutrition Facts labels) on foods to learn how much sodium is in one serving.  Eat more home-cooked food and less restaurant, buffet, and fast food.  When eating at a restaurant, ask that your food be prepared with less salt or none, if possible.  HOW DO I READ FOOD LABELS FOR SODIUM INFORMATION? The Nutrition Facts label lists the  amount of sodium in one serving of the food. If you eat more than one serving, you must multiply the listed amount of sodium by the number of servings. Food labels may also identify foods as:  Sodium free--Less than 5 mg in a serving.  Very low sodium--35 mg or less in a serving.  Low sodium--140 mg or less in a serving.  Light in sodium--50% less sodium in a serving. For example, if a food that usually has 300 mg of sodium is changed to become light in sodium, it will have 150 mg of sodium.  Reduced sodium--25% less sodium in a serving. For example, if a food that usually has 400 mg of sodium is changed to reduced sodium, it will have 300 mg of sodium. WHAT FOODS CAN I EAT? Grains Low-sodium cereals, including oats, puffed wheat and rice, and shredded wheat cereals. Low-sodium crackers. Unsalted rice and pasta. Lower-sodium bread.  Vegetables Frozen or fresh vegetables. Low-sodium or reduced-sodium canned vegetables. Low-sodium or reduced-sodium tomato sauce and paste. Low-sodium or reduced-sodium tomato and vegetable juices.  Fruits Fresh, frozen, and canned fruit. Fruit juice.  Meat and Other Protein Products Low-sodium canned tuna and salmon. Fresh or frozen meat, poultry, seafood, and fish. Lamb. Unsalted nuts. Dried beans, peas, and lentils without added salt. Unsalted canned beans. Homemade soups without salt. Eggs.  Dairy Milk. Soy milk. Ricotta cheese. Low-sodium or reduced-sodium cheeses. Yogurt.  Condiments Fresh and dried herbs and spices. Salt-free seasonings. Onion  and garlic powders. Low-sodium varieties of mustard and ketchup. Lemon juice.  Fats and Oils Reduced-sodium salad dressings. Unsalted butter.  Other Unsalted popcorn and pretzels.  The items listed above may not be a complete list of recommended foods or beverages. Contact your dietitian for more options. WHAT FOODS ARE NOT RECOMMENDED? Grains Instant hot cereals. Bread stuffing, pancake, and  biscuit mixes. Croutons. Seasoned rice or pasta mixes. Noodle soup cups. Boxed or frozen macaroni and cheese. Self-rising flour. Regular salted crackers. Vegetables Regular canned vegetables. Regular canned tomato sauce and paste. Regular tomato and vegetable juices. Frozen vegetables in sauces. Salted french fries. Olives. Angie Fava. Relishes. Sauerkraut. Salsa. Meat and Other Protein Products Salted, canned, smoked, spiced, or pickled meats, seafood, or fish. Bacon, ham, sausage, hot dogs, corned beef, chipped beef, and packaged luncheon meats. Salt pork. Jerky. Pickled herring. Anchovies, regular canned tuna, and sardines. Salted nuts. Dairy Processed cheese and cheese spreads. Cheese curds. Blue cheese and cottage cheese. Buttermilk.  Condiments Onion and garlic salt, seasoned salt, table salt, and sea salt. Canned and packaged gravies. Worcestershire sauce. Tartar sauce. Barbecue sauce. Teriyaki sauce. Soy sauce, including reduced sodium. Steak sauce. Fish sauce. Oyster sauce. Cocktail sauce. Horseradish. Regular ketchup and mustard. Meat flavorings and tenderizers. Bouillon cubes. Hot sauce. Tabasco sauce. Marinades. Taco seasonings. Relishes. Fats and Oils Regular salad dressings. Salted butter. Margarine. Ghee. Bacon fat.  Other Potato and tortilla chips. Corn chips and puffs. Salted popcorn and pretzels. Canned or dried soups. Pizza. Frozen entrees and pot pies.  The items listed above may not be a complete list of foods and beverages to avoid. Contact your dietitian for more information. Document Released: 02/27/2002 Document Revised: 09/12/2013 Document Reviewed: 07/12/2013 [facility] Patient Information 2015 [place], [place]. This information is not intended to replace advice given to you by your health care provider. Make sure you discuss any questions you have with your health care provider.

## 2014-10-17 NOTE — Progress Notes (Signed)
Pre visit review using our clinic review tool, if applicable. No additional management support is needed unless otherwise documented below in the visit note. 

## 2014-10-17 NOTE — Progress Notes (Signed)
   Subjective:    Patient ID: Maria Lowe, female    DOB: 13-Sep-1931, 81 y.o.   MRN: [id number]  HPI 81 year old [place] female, nonsmoker with a history of hypertension and obesity is in today for recheck. Reports doing well. Tolerates blood pressure medication. Has noticed an increase in her blood pressure over the last several months typically in the 150s over 90s. Has been on the same blood pressure medication for many years. Reports a low-sodium diet. And exercises.   Review of Systems  Constitutional: Negative.   HENT: Negative.   Respiratory: Negative.   Cardiovascular: Negative.   Gastrointestinal: Negative.   Endocrine: Negative.   Genitourinary: Negative.   Musculoskeletal: Negative.   Skin: Negative.   Allergic/Immunologic: Negative.   Neurological: Negative.   Hematological: Negative.   Psychiatric/Behavioral: Negative.    Past Medical History  Diagnosis Date  . Ulcer   . Arthritis   . Hypertension   . CLL (chronic lymphocytic leukemia) 11/27/2013    History   Social History  . Marital Status: Married    Spouse Name: N/A    Number of Children: N/A  . Years of Education: N/A   Occupational History  . Not on file.   Social History Main Topics  . Smoking status: Never Smoker   . Smokeless tobacco: Never Used  . Alcohol Use: Yes  . Drug Use: No  . Sexual Activity: Not on file   Other Topics Concern  . Not on file   Social History Narrative    Past Surgical History  Procedure Laterality Date  . Tonsillectomy and adenoidectomy    . Appendectomy      History reviewed. No pertinent family history.  No Known Allergies  Current Outpatient Prescriptions on File Prior to Visit  Medication Sig Dispense Refill  . atenolol (TENORMIN) 100 MG tablet TAKE ONE TABLET BY MOUTH ONE TIME DAILY .Marland Kitchen...need an appointment 30 tablet 2   No current facility-administered medications on file prior to visit.    BP 152/88 mmHg  Pulse 79  Temp(Src) 97.3 F (36.3  C) (Oral)  Ht 5\' 2"  (1.575 m)  Wt 178 lb 6.4 oz (80.922 kg)  BMI 32.62 kg/m2chart    Objective:   Physical Exam  Constitutional: She is oriented to person, place, and time. She appears well-developed and well-nourished.  HENT:  Right Ear: External ear normal.  Left Ear: External ear normal.  Nose: Nose normal.  Mouth/Throat: Oropharynx is clear and moist.  Neck: Normal range of motion. Neck supple.  Cardiovascular: Normal rate, regular rhythm and normal heart sounds.   Pulmonary/Chest: Effort normal and breath sounds normal.  Abdominal: Soft. Bowel sounds are normal.  Musculoskeletal: Normal range of motion.  Neurological: She is alert and oriented to person, place, and time.  Skin: Skin is warm and dry.  Psychiatric: She has a normal mood and affect.          Assessment & Plan:  Maria Lowe was seen today for follow-up.  Diagnoses and associated orders for this visit:  Essential hypertension  Obesity  Hyperglycemia  Other Orders - amLODipine-benazepril (LOTREL) 5-40 MG per capsule; Take 1 capsule by mouth daily.    Discontinue Hyzaar and start Lotrel once daily. Recheck in one month. Encouraged healthy diet and exercise.

## 2014-10-30 ENCOUNTER — Telehealth: Payer: Self-pay

## 2014-10-30 NOTE — Telephone Encounter (Signed)
          MY CHART DOES NOT WORK SO I SEND THIS BY MY HUSBAND: I HAD THE FOLLOWING READINGS FOR MY BP DURING THE PAST DAYS WITH THE HEART RATE AS THE 3rd (THIRD) NUMBER: 139/42 70, 160/80 66, 148/62 72,  157/85 65, 157/85 65, 157/85 65, 139/83 64,  146/80 72,  153/74  62,  153/79 64  I HAVE HEADACHS MOST OF THE DAYS. Maria Lowe.  I called to speak with pt. Advised her that she cannot send messages about herself through her husbands MyChart acct as the messages are permanently attached to the chart. Also advise an appointment for possible adjustment of BP meds. pt is on new meds and having headaches and elevated readings.   Appointment scheduled

## 2014-11-02 ENCOUNTER — Ambulatory Visit (INDEPENDENT_AMBULATORY_CARE_PROVIDER_SITE_OTHER): Payer: PPO | Admitting: Family

## 2014-11-02 ENCOUNTER — Encounter: Payer: Self-pay | Admitting: Family

## 2014-11-02 VITALS — BP 160/80 | HR 78 | Temp 97.7°F | Ht 62.0 in | Wt 180.0 lb

## 2014-11-02 DIAGNOSIS — I10 Essential (primary) hypertension: Secondary | ICD-10-CM

## 2014-11-02 DIAGNOSIS — R51 Headache: Secondary | ICD-10-CM

## 2014-11-02 DIAGNOSIS — R519 Headache, unspecified: Secondary | ICD-10-CM

## 2014-11-02 MED ORDER — LOSARTAN POTASSIUM-HCTZ 100-12.5 MG PO TABS: 1.0000 | ORAL_TABLET | Freq: Every day | ORAL | Status: DC

## 2014-11-02 NOTE — Progress Notes (Signed)
Pre visit review using our clinic review tool, if applicable. No additional management support is needed unless otherwise documented below in the visit note. 

## 2014-11-02 NOTE — Progress Notes (Signed)
   Subjective:    Patient ID: Maria Lowe, female    DOB: 01/17/1931, 81 y.o.   MRN: [id number]  HPI 81 year old female, nonsmoker with a history of hypertension is in today after being switched from Hyzaar to Lotrel for better blood pressure control. Reports having a headache on the medication and had to discontinue it 2 days ago. Resumed taking Hyzaar and is tolerating it well. Blood pressure readings at home 812 to 751Z systolically over 00F. Will like to continue Hyzaar and atenolol that she is currently.   Review of Systems  Constitutional: Negative.   HENT: Negative.   Respiratory: Negative.   Cardiovascular: Negative.   Gastrointestinal: Negative.   Endocrine: Negative.   Genitourinary: Negative.   Musculoskeletal: Negative.   Skin: Negative.   Allergic/Immunologic: Negative.   Neurological: Negative.   Hematological: Negative.   Psychiatric/Behavioral: Negative.    Past Medical History  Diagnosis Date  . Ulcer   . Arthritis   . Hypertension   . CLL (chronic lymphocytic leukemia) 11/27/2013    History   Social History  . Marital Status: Married    Spouse Name: N/A  . Number of Children: N/A  . Years of Education: N/A   Occupational History  . Not on file.   Social History Main Topics  . Smoking status: Never Smoker   . Smokeless tobacco: Never Used  . Alcohol Use: Yes  . Drug Use: No  . Sexual Activity: Not on file   Other Topics Concern  . Not on file   Social History Narrative    Past Surgical History  Procedure Laterality Date  . Tonsillectomy and adenoidectomy    . Appendectomy      No family history on file.  No Known Allergies  Current Outpatient Prescriptions on File Prior to Visit  Medication Sig Dispense Refill  . atenolol (TENORMIN) 100 MG tablet TAKE ONE TABLET BY MOUTH ONE TIME DAILY .Marland Kitchen...need an appointment 30 tablet 2   No current facility-administered medications on file prior to visit.    BP 160/80 mmHg  Pulse 78   Temp(Src) 97.7 F (36.5 C) (Oral)  Ht 5\' 2"  (1.575 m)  Wt 180 lb (81.647 kg)  BMI 32.91 kg/m2chart    Objective:   Physical Exam  Constitutional: She is oriented to person, place, and time. She appears well-developed and well-nourished.  HENT:  Right Ear: External ear normal.  Left Ear: External ear normal.  Nose: Nose normal.  Mouth/Throat: Oropharynx is clear and moist.  Neck: Normal range of motion. Neck supple. No thyromegaly present.  Cardiovascular: Normal rate, regular rhythm and normal heart sounds.   Pulmonary/Chest: Effort normal and breath sounds normal.  Abdominal: Soft. Bowel sounds are normal.  Musculoskeletal: Normal range of motion.  Neurological: She is alert and oriented to person, place, and time.  Skin: Skin is warm and dry.  Psychiatric: She has a normal mood and affect.          Assessment & Plan:  Maria Lowe was seen today for medication problem.  Diagnoses and all orders for this visit:  Hypertension, uncontrolled  Acute nonintractable headache, unspecified headache type  Other orders -     losartan-hydrochlorothiazide (HYZAAR) 100-12.5 MG per tablet; Take 1 tablet by mouth daily.   Encouraged low-sodium diet. Cardiovascular exercise. Continue current medication. Discontinue Lotrel. Call the office with any questions or concerns. Recheck in 2 weeks and sooner as needed.

## 2014-11-02 NOTE — Patient Instructions (Signed)

## 2014-11-26 ENCOUNTER — Other Ambulatory Visit (HOSPITAL_BASED_OUTPATIENT_CLINIC_OR_DEPARTMENT_OTHER): Payer: PPO

## 2014-11-26 ENCOUNTER — Ambulatory Visit (HOSPITAL_BASED_OUTPATIENT_CLINIC_OR_DEPARTMENT_OTHER): Payer: PPO | Admitting: Hematology and Oncology

## 2014-11-26 ENCOUNTER — Encounter: Payer: Self-pay | Admitting: Hematology and Oncology

## 2014-11-26 ENCOUNTER — Telehealth: Payer: Self-pay | Admitting: Hematology and Oncology

## 2014-11-26 VITALS — BP 154/70 | HR 68 | Temp 97.6°F | Resp 18 | Ht 62.0 in | Wt 177.2 lb

## 2014-11-26 DIAGNOSIS — I10 Essential (primary) hypertension: Secondary | ICD-10-CM

## 2014-11-26 DIAGNOSIS — D696 Thrombocytopenia, unspecified: Secondary | ICD-10-CM | POA: Insufficient documentation

## 2014-11-26 DIAGNOSIS — C9110 Chronic lymphocytic leukemia of B-cell type not having achieved remission: Secondary | ICD-10-CM

## 2014-11-26 LAB — CBC WITH DIFFERENTIAL/PLATELET
BASO%: 0.2 % (ref 0.0–2.0)
BASOS ABS: 0.1 10*3/uL (ref 0.0–0.1)
EOS%: 0.3 % (ref 0.0–7.0)
Eosinophils Absolute: 0.1 10*3/uL (ref 0.0–0.5)
HCT: 40.1 % (ref 34.8–46.6)
HGB: 12.6 g/dL (ref 11.6–15.9)
LYMPH%: 74.2 % — ABNORMAL HIGH (ref 14.0–49.7)
MCH: 28.8 pg (ref 25.1–34.0)
MCHC: 31.5 g/dL (ref 31.5–36.0)
MCV: 91.6 fL (ref 79.5–101.0)
MONO#: 0.6 10*3/uL (ref 0.1–0.9)
MONO%: 2.6 % (ref 0.0–14.0)
NEUT#: 5.4 10*3/uL (ref 1.5–6.5)
NEUT%: 22.7 % — AB (ref 38.4–76.8)
Platelets: 115 10*3/uL — ABNORMAL LOW (ref 145–400)
RBC: 4.37 10*6/uL (ref 3.70–5.45)
RDW: 14.6 % — ABNORMAL HIGH (ref 11.2–14.5)
WBC: 23.8 10*3/uL — ABNORMAL HIGH (ref 3.9–10.3)
lymph#: 17.6 10*3/uL — ABNORMAL HIGH (ref 0.9–3.3)

## 2014-11-26 LAB — TECHNOLOGIST REVIEW

## 2014-11-26 NOTE — Assessment & Plan Note (Signed)
Clinically, she has no signs of disease progression. Continue yearly history, physical examination and blood work.   

## 2014-11-26 NOTE — Assessment & Plan Note (Signed)
She has chronic hypertension. Her blood pressure is mildly elevated. She will continue close follow-up with PCP for medication adjustment.

## 2014-11-26 NOTE — Telephone Encounter (Signed)
gv adn pritned appt sched and avs for pt for March 2017

## 2014-11-26 NOTE — Assessment & Plan Note (Signed)
This is related to CLL but she is not symptomatic. Continue observation only.

## 2014-11-26 NOTE — Progress Notes (Signed)
[facility] OFFICE PROGRESS NOTE  Patient Care Team: Timoteo Gaul, FNP as PCP - General (Family Medicine) Heath Lark, MD as Consulting Physician (Hematology and Oncology) DIAGNOSIS: CLL, Rai stage 0 with thrombocytopenia  SUMMARY OF ONCOLOGIC HISTORY: This patient was diagnosed with CLL around 2009 from abnormal leukocytosis. She is asymptomatic. She is being observed.  INTERVAL HISTORY: Please see below for problem oriented charting. She is doing well. Denies new lymphadenopathy. Denies any anorexia, weight loss or night sweats. No recent infection.  REVIEW OF SYSTEMS:   Constitutional: Denies fevers, chills or abnormal weight loss Eyes: Denies blurriness of vision Ears, nose, mouth, throat, and face: Denies mucositis or sore throat Respiratory: Denies cough, dyspnea or wheezes Cardiovascular: Denies palpitation, chest discomfort or lower extremity swelling Gastrointestinal:  Denies nausea, heartburn or change in bowel habits Skin: Denies abnormal skin rashes Lymphatics: Denies new lymphadenopathy or easy bruising Neurological:Denies numbness, tingling or new weaknesses Behavioral/Psych: Mood is stable, no new changes  All other systems were reviewed with the patient and are negative.  I have reviewed the past medical history, past surgical history, social history and family history with the patient and they are unchanged from previous note.  ALLERGIES:  has No Known Allergies.  MEDICATIONS:  Current Outpatient Prescriptions  Medication Sig Dispense Refill  . atenolol (TENORMIN) 100 MG tablet TAKE ONE TABLET BY MOUTH ONE TIME DAILY .Marland Kitchen...need an appointment 30 tablet 2  . losartan-hydrochlorothiazide (HYZAAR) 100-12.5 MG per tablet Take 1 tablet by mouth daily. 90 tablet 3   No current facility-administered medications for this visit.    PHYSICAL EXAMINATION: ECOG PERFORMANCE STATUS: 0 - Asymptomatic  Filed Vitals:   11/26/14 1441  BP: 154/70   Pulse: 68  Temp: 97.6 F (36.4 C)  Resp: 18   Filed Weights   11/26/14 1441  Weight: 177 lb 3.2 oz (80.377 kg)    GENERAL:alert, no distress and comfortable SKIN: skin color, texture, turgor are normal, no rashes or significant lesions EYES: normal, Conjunctiva are pink and non-injected, sclera clear OROPHARYNX:no exudate, no erythema and lips, buccal mucosa, and tongue normal  NECK: supple, thyroid normal size, non-tender, without nodularity LYMPH:  no palpable lymphadenopathy in the cervical, axillary or inguinal LUNGS: clear to auscultation and percussion with normal breathing effort HEART: regular rate & rhythm and no murmurs and no lower extremity edema ABDOMEN:abdomen soft, non-tender and normal bowel sounds Musculoskeletal:no cyanosis of digits and no clubbing  NEURO: alert & oriented x 3 with fluent speech, no focal motor/sensory deficits  LABORATORY DATA:  I have reviewed the data as listed    Component Value Date/Time   NA 140 04/13/2014 1452   NA 144 11/27/2013 0836   K 4.0 04/13/2014 1452   K 3.8 11/27/2013 0836   CL 102 04/13/2014 1452   CO2 31 04/13/2014 1452   CO2 30* 11/27/2013 0836   GLUCOSE 110* 04/13/2014 1452   GLUCOSE 123 11/27/2013 0836   BUN 22 04/13/2014 1452   BUN 22.2 11/27/2013 0836   CREATININE 0.9 04/13/2014 1452   CREATININE 0.8 11/27/2013 0836   CALCIUM 9.4 04/13/2014 1452   CALCIUM 10.1 11/27/2013 0836   PROT 6.6 11/27/2013 0836   PROT 6.9 11/07/2012 0954   ALBUMIN 4.2 11/27/2013 0836   ALBUMIN 4.6 11/07/2012 0954   AST 48* 11/27/2013 0836   AST 22 11/07/2012 0954   ALT 37 11/27/2013 0836   ALT 18 11/07/2012 0954   ALKPHOS 88 11/27/2013 0836   ALKPHOS 65 11/07/2012 0954  BILITOT 0.88 11/27/2013 0836   BILITOT 1.0 11/07/2012 0954    No results found for: SPEP, UPEP  Lab Results  Component Value Date   WBC 23.8* 11/26/2014   NEUTROABS 5.4 11/26/2014   HGB 12.6 11/26/2014   HCT 40.1 11/26/2014   MCV 91.6 11/26/2014   PLT  115* 11/26/2014      Chemistry      Component Value Date/Time   NA 140 04/13/2014 1452   NA 144 11/27/2013 0836   K 4.0 04/13/2014 1452   K 3.8 11/27/2013 0836   CL 102 04/13/2014 1452   CO2 31 04/13/2014 1452   CO2 30* 11/27/2013 0836   BUN 22 04/13/2014 1452   BUN 22.2 11/27/2013 0836   CREATININE 0.9 04/13/2014 1452   CREATININE 0.8 11/27/2013 0836      Component Value Date/Time   CALCIUM 9.4 04/13/2014 1452   CALCIUM 10.1 11/27/2013 0836   ALKPHOS 88 11/27/2013 0836   ALKPHOS 65 11/07/2012 0954   AST 48* 11/27/2013 0836   AST 22 11/07/2012 0954   ALT 37 11/27/2013 0836   ALT 18 11/07/2012 0954   BILITOT 0.88 11/27/2013 0836   BILITOT 1.0 11/07/2012 0954      ASSESSMENT & PLAN:  CLL (chronic lymphocytic leukemia) Clinically, she has no signs of disease progression. Continue yearly history, physical examination and blood work.   Thrombocytopenia This is related to CLL but she is not symptomatic. Continue observation only.   Essential hypertension, benign She has chronic hypertension. Her blood pressure is mildly elevated. She will continue close follow-up with PCP for medication adjustment.    Orders Placed This Encounter  Procedures  . Comprehensive metabolic panel    Standing Status: Future     Number of Occurrences:      Standing Expiration Date: 12/31/2015  . CBC with Differential/Platelet    Standing Status: Future     Number of Occurrences:      Standing Expiration Date: 12/31/2015  . Lactate dehydrogenase    Standing Status: Future     Number of Occurrences:      Standing Expiration Date: 12/31/2015   All questions were answered. The patient knows to call the clinic with any problems, questions or concerns. No barriers to learning was detected. I spent 15 minutes counseling the patient face to face. The total time spent in the appointment was 20 minutes and more than 50% was on counseling and review of test results     [facility], [place], MD 11/26/2014  4:02 PM

## 2014-12-26 ENCOUNTER — Other Ambulatory Visit: Payer: Self-pay | Admitting: Family

## 2015-01-30 ENCOUNTER — Other Ambulatory Visit: Payer: Self-pay | Admitting: Family

## 2015-03-29 ENCOUNTER — Encounter: Payer: Self-pay | Admitting: Family

## 2015-03-29 ENCOUNTER — Ambulatory Visit (INDEPENDENT_AMBULATORY_CARE_PROVIDER_SITE_OTHER): Payer: PPO | Admitting: Family

## 2015-03-29 VITALS — BP 110/80 | HR 68 | Temp 98.1°F | Wt 176.0 lb

## 2015-03-29 DIAGNOSIS — N814 Uterovaginal prolapse, unspecified: Secondary | ICD-10-CM | POA: Diagnosis not present

## 2015-03-29 DIAGNOSIS — N816 Rectocele: Secondary | ICD-10-CM | POA: Insufficient documentation

## 2015-03-29 NOTE — Progress Notes (Signed)
Pre visit review using our clinic review tool, if applicable. No additional management support is needed unless otherwise documented below in the visit note. 

## 2015-03-29 NOTE — Progress Notes (Signed)
   Subjective:    Patient ID: Maria Lowe, female    DOB: Feb 07, 1931, 81 y.o.   MRN: [id number]  HPI 81 year old white female, nonsmoker is in today with concerns of a bulge from her vagina 1 week. Denies any pain. No vaginal discharge or odor. She's not sexually active. Noticed a bulge and became concerned.   Review of Systems  Constitutional: Negative.   Respiratory: Negative.   Cardiovascular: Negative.   Gastrointestinal: Negative.   Genitourinary: Negative for dysuria, frequency, vaginal bleeding, vaginal discharge, vaginal pain and pelvic pain.  Musculoskeletal: Negative.   Skin: Negative.   Allergic/Immunologic: Negative.   Neurological: Negative.   Psychiatric/Behavioral: Negative.    Past Medical History  Diagnosis Date  . Ulcer   . Arthritis   . Hypertension   . CLL (chronic lymphocytic leukemia) 11/27/2013    History   Social History  . Marital Status: Married    Spouse Name: N/A  . Number of Children: N/A  . Years of Education: N/A   Occupational History  . Not on file.   Social History Main Topics  . Smoking status: Never Smoker   . Smokeless tobacco: Never Used  . Alcohol Use: Yes  . Drug Use: No  . Sexual Activity: Not on file   Other Topics Concern  . Not on file   Social History Narrative    Past Surgical History  Procedure Laterality Date  . Tonsillectomy and adenoidectomy    . Appendectomy      History reviewed. No pertinent family history.  No Known Allergies  Current Outpatient Prescriptions on File Prior to Visit  Medication Sig Dispense Refill  . atenolol (TENORMIN) 100 MG tablet TAKE ONE TABLET BY MOUTH ONE TIME DAILY  90 tablet 1  . losartan-hydrochlorothiazide (HYZAAR) 100-25 MG per tablet TAKE ONE TABLET BY MOUTH ONE TIME DAILY 90 tablet 2   No current facility-administered medications on file prior to visit.    BP 110/80 mmHg  Pulse 68  Temp(Src) 98.1 F (36.7 C) (Oral)  Wt 176 lb (79.833 kg)chart    Objective:     Physical Exam  Constitutional: She is oriented to person, place, and time. She appears well-developed and well-nourished.  Cardiovascular: Normal rate, regular rhythm and normal heart sounds.   Pulmonary/Chest: Effort normal.  Abdominal: Soft. Bowel sounds are normal.  Genitourinary:  Uterine prolapse noted. Nontender.  Musculoskeletal: Normal range of motion.  Neurological: She is alert and oriented to person, place, and time.  Skin: Skin is warm and dry.  Psychiatric: She has a normal mood and affect.          Assessment & Plan:  [name] was seen today for foreign body in vagina.  Diagnoses and all orders for this visit:  Uterine prolapse   Call the office with any questions or concerns. Information provided today regarding uterine prolapse. Patient verbalizes understanding.

## 2015-04-18 ENCOUNTER — Other Ambulatory Visit: Payer: Self-pay | Admitting: Family

## 2015-06-23 ENCOUNTER — Other Ambulatory Visit: Payer: Self-pay | Admitting: Family

## 2015-07-15 ENCOUNTER — Other Ambulatory Visit: Payer: Self-pay | Admitting: Family

## 2015-08-26 ENCOUNTER — Telehealth: Payer: Self-pay | Admitting: Family

## 2015-08-26 NOTE — Telephone Encounter (Signed)
ERROR

## 2015-09-30 ENCOUNTER — Encounter: Payer: Self-pay | Admitting: Family Medicine

## 2015-09-30 ENCOUNTER — Ambulatory Visit (INDEPENDENT_AMBULATORY_CARE_PROVIDER_SITE_OTHER): Payer: PPO | Admitting: Family Medicine

## 2015-09-30 VITALS — BP 130/84 | HR 68 | Ht 62.0 in | Wt 184.0 lb

## 2015-09-30 DIAGNOSIS — I10 Essential (primary) hypertension: Secondary | ICD-10-CM

## 2015-09-30 DIAGNOSIS — R739 Hyperglycemia, unspecified: Secondary | ICD-10-CM | POA: Diagnosis not present

## 2015-09-30 DIAGNOSIS — Z87891 Personal history of nicotine dependence: Secondary | ICD-10-CM | POA: Insufficient documentation

## 2015-09-30 NOTE — Assessment & Plan Note (Signed)
S: controlled. On Atenolol 100mg  daily, losartan-hctz 100-25mg   BP Readings from Last 3 Encounters:  09/30/15 130/84  03/29/15 110/80  11/26/14 154/70  A/P:Continue current medications

## 2015-09-30 NOTE — Progress Notes (Signed)
Maria Reddish, MD  Subjective:  Maria Lowe is a 82 y.o. year old very pleasant female patient who presents for/with See problem oriented charting. Establish visit from Dr. Megan Salon today.  ROS- No chest pain or shortness of breath. No headache or blurry vision.   Past Medical History-  Patient Active Problem List   Diagnosis Date Noted  . Thrombocytopenia ([place]) 11/26/2014    Priority: High  . CLL (chronic lymphocytic leukemia) ([place]) 11/27/2013    Priority: High  . Hyperglycemia 05/08/2013    Priority: Medium  . Essential hypertension, benign 11/07/2012    Priority: Medium  . Uterine prolapse 03/29/2015    Priority: Low  . Obesity, unspecified 11/07/2012    Priority: Low  . Former smoker 09/30/2015    Medications- reviewed and updated Current Outpatient Prescriptions  Medication Sig Dispense Refill  . atenolol (TENORMIN) 100 MG tablet TAKE ONE TABLET BY MOUTH ONE TIME DAILY 90 tablet 1  . losartan-hydrochlorothiazide (HYZAAR) 100-25 MG tablet TAKE ONE TABLET BY MOUTH ONE TIME DAILY 90 tablet 0   No current facility-administered medications for this visit.    Objective: BP 130/84 mmHg  Pulse 68  Ht 5\' 2"  (1.575 m)  Wt 184 lb (83.462 kg)  BMI 33.65 kg/m2 Gen: NAD, resting comfortably, appears younger than stated age. Gets onto table with 1 hand assist only CV: RRR no murmurs rubs or gallops Lungs: CTAB no crackles, wheeze, rhonchi Abdomen: soft/nontender/nondistended/normal bowel sounds. No rebound or guarding.  Ext: no edema Skin: warm, dry Neuro: grossly normal, moves all extremities  Assessment/Plan:   Essential hypertension, benign S: controlled. On Atenolol 100mg  daily, losartan-hctz 100-25mg   BP Readings from Last 3 Encounters:  09/30/15 130/84  03/29/15 110/80  11/26/14 154/70  A/P:Continue current medications   Hyperglycemia S: Patient weight formerly  Closer to 200 when a1c was 6.1. She had lost weight but bumped up over holidays Wt Readings  from Last 3 Encounters:  09/30/15 184 lb (83.462 kg)  03/29/15 176 lb (79.833 kg)  11/26/14 177 lb 3.2 oz (80.377 kg)  A/P: We discussed losing the holiday weight and considering a1c at follow up- may get fasting CBG with oncology soon as well though. Counseled on regular exercise, healthy eating- she is doing reasonably with these   Return in about 6 months (around 03/29/2016) for physical (healt[employer] covers one per year). Pneumovax 06/21/2013. 1st one before age 32. Abstract in 2nd immunization and give prevnar at that time.  Sees oncology in march- review note at time of visit  Return precautions advised.    >50% of 20 minute office visit was spent on counseling (healthy eating, regular exercise, risk for diabetes) and coordination of care

## 2015-09-30 NOTE — Patient Instructions (Signed)
Things look great! No changes today

## 2015-09-30 NOTE — Assessment & Plan Note (Signed)
S: Patient weight formerly  Closer to 200 when a1c was 6.1. She had lost weight but bumped up over holidays Wt Readings from Last 3 Encounters:  09/30/15 184 lb (83.462 kg)  03/29/15 176 lb (79.833 kg)  11/26/14 177 lb 3.2 oz (80.377 kg)  A/P: We discussed losing the holiday weight and considering a1c at follow up- may get fasting CBG with oncology soon as well though. Counseled on regular exercise, healthy eating- she is doing reasonably with these

## 2015-09-30 NOTE — Progress Notes (Signed)
Pre visit review using our clinic review tool, if applicable. No additional management support is needed unless otherwise documented below in the visit note. 

## 2015-10-07 ENCOUNTER — Other Ambulatory Visit: Payer: Self-pay | Admitting: Family

## 2015-11-27 ENCOUNTER — Telehealth: Payer: Self-pay | Admitting: Hematology and Oncology

## 2015-11-27 ENCOUNTER — Telehealth: Payer: Self-pay | Admitting: *Deleted

## 2015-11-27 NOTE — Telephone Encounter (Signed)
s.w. pt and advised on new d.t for 3.9 appt...pt ok and aware

## 2015-11-27 NOTE — Telephone Encounter (Signed)
Called pt regarding appt change tomorrow, but scheduler had already taken care of it.Marland Kitchen  Pt aware to come in a little later than originally scheduled.

## 2015-11-28 ENCOUNTER — Other Ambulatory Visit (HOSPITAL_BASED_OUTPATIENT_CLINIC_OR_DEPARTMENT_OTHER): Payer: PPO

## 2015-11-28 ENCOUNTER — Ambulatory Visit: Payer: PPO | Admitting: Hematology and Oncology

## 2015-11-28 ENCOUNTER — Ambulatory Visit (HOSPITAL_BASED_OUTPATIENT_CLINIC_OR_DEPARTMENT_OTHER): Payer: PPO | Admitting: Hematology and Oncology

## 2015-11-28 ENCOUNTER — Other Ambulatory Visit: Payer: PPO

## 2015-11-28 ENCOUNTER — Encounter: Payer: Self-pay | Admitting: Hematology and Oncology

## 2015-11-28 VITALS — BP 146/74 | HR 74 | Temp 98.0°F | Resp 17 | Ht 62.0 in | Wt 181.5 lb

## 2015-11-28 DIAGNOSIS — I10 Essential (primary) hypertension: Secondary | ICD-10-CM | POA: Diagnosis not present

## 2015-11-28 DIAGNOSIS — C9110 Chronic lymphocytic leukemia of B-cell type not having achieved remission: Secondary | ICD-10-CM | POA: Diagnosis not present

## 2015-11-28 DIAGNOSIS — D696 Thrombocytopenia, unspecified: Secondary | ICD-10-CM | POA: Diagnosis not present

## 2015-11-28 DIAGNOSIS — D72829 Elevated white blood cell count, unspecified: Secondary | ICD-10-CM

## 2015-11-28 LAB — CBC WITH DIFFERENTIAL/PLATELET
BASO%: 0.2 % (ref 0.0–2.0)
Basophils Absolute: 0 10*3/uL (ref 0.0–0.1)
EOS%: 0.4 % (ref 0.0–7.0)
Eosinophils Absolute: 0.1 10*3/uL (ref 0.0–0.5)
HCT: 43 % (ref 34.8–46.6)
HGB: 13.9 g/dL (ref 11.6–15.9)
LYMPH%: 71.8 % — AB (ref 14.0–49.7)
MCH: 29.3 pg (ref 25.1–34.0)
MCHC: 32.3 g/dL (ref 31.5–36.0)
MCV: 90.6 fL (ref 79.5–101.0)
MONO#: 0.6 10*3/uL (ref 0.1–0.9)
MONO%: 2.9 % (ref 0.0–14.0)
NEUT%: 24.7 % — AB (ref 38.4–76.8)
NEUTROS ABS: 5.2 10*3/uL (ref 1.5–6.5)
PLATELETS: 101 10*3/uL — AB (ref 145–400)
RBC: 4.74 10*6/uL (ref 3.70–5.45)
RDW: 14.3 % (ref 11.2–14.5)
WBC: 21.2 10*3/uL — AB (ref 3.9–10.3)
lymph#: 15.2 10*3/uL — ABNORMAL HIGH (ref 0.9–3.3)

## 2015-11-28 LAB — COMPREHENSIVE METABOLIC PANEL
ALT: 15 U/L (ref 0–55)
AST: 17 U/L (ref 5–34)
Albumin: 4.2 g/dL (ref 3.5–5.0)
Alkaline Phosphatase: 72 U/L (ref 40–150)
Anion Gap: 9 mEq/L (ref 3–11)
BUN: 23.6 mg/dL (ref 7.0–26.0)
CALCIUM: 9.5 mg/dL (ref 8.4–10.4)
CHLORIDE: 105 meq/L (ref 98–109)
CO2: 27 meq/L (ref 22–29)
CREATININE: 1 mg/dL (ref 0.6–1.1)
EGFR: 54 mL/min/{1.73_m2} — ABNORMAL LOW (ref >90)
GLUCOSE: 113 mg/dL (ref 70–140)
POTASSIUM: 4.3 meq/L (ref 3.5–5.1)
SODIUM: 141 meq/L (ref 136–145)
Total Bilirubin: 0.64 mg/dL (ref 0.20–1.20)
Total Protein: 6.6 g/dL (ref 6.4–8.3)

## 2015-11-28 LAB — TECHNOLOGIST REVIEW

## 2015-11-28 LAB — LACTATE DEHYDROGENASE: LDH: 173 U/L (ref 125–245)

## 2015-11-28 NOTE — Progress Notes (Signed)
[facility] OFFICE PROGRESS NOTE  Patient Care Team: [name], MD as PCP - General (Family Medicine) Heath Lark, MD as Consulting Physician (Hematology and Oncology)  SUMMARY OF ONCOLOGIC HISTORY:  This patient was diagnosed with CLL around 2009 from abnormal leukocytosis. She is asymptomatic. She is being observed.  INTERVAL HISTORY: Please see below for problem oriented charting. She is doing well. Denies new lymphadenopathy. Denies any anorexia, weight loss or night sweats. No recent infection. The patient denies any recent signs or symptoms of bleeding such as spontaneous epistaxis, hematuria or hematochezia.   REVIEW OF SYSTEMS:   Constitutional: Denies fevers, chills or abnormal weight loss Eyes: Denies blurriness of vision Ears, nose, mouth, throat, and face: Denies mucositis or sore throat Respiratory: Denies cough, dyspnea or wheezes Cardiovascular: Denies palpitation, chest discomfort or lower extremity swelling Gastrointestinal:  Denies nausea, heartburn or change in bowel habits Skin: Denies abnormal skin rashes Lymphatics: Denies new lymphadenopathy or easy bruising Neurological:Denies numbness, tingling or new weaknesses Behavioral/Psych: Mood is stable, no new changes  All other systems were reviewed with the patient and are negative.  I have reviewed the past medical history, past surgical history, social history and family history with the patient and they are unchanged from previous note.  ALLERGIES:  has No Known Allergies.  MEDICATIONS:  Current Outpatient Prescriptions  Medication Sig Dispense Refill  . atenolol (TENORMIN) 100 MG tablet TAKE ONE TABLET BY MOUTH ONE TIME DAILY 90 tablet 1  . losartan-hydrochlorothiazide (HYZAAR) 100-25 MG tablet TAKE ONE TABLET BY MOUTH ONE TIME DAILY 90 tablet 0   No current facility-administered medications for this visit.    PHYSICAL EXAMINATION: ECOG PERFORMANCE STATUS: 0 -  Asymptomatic  Filed Vitals:   11/28/15 1010  BP: 146/74  Pulse: 74  Temp: 98 F (36.7 C)  Resp: 17   Filed Weights   11/28/15 1010  Weight: 181 lb 8 oz (82.328 kg)    GENERAL:alert, no distress and comfortable SKIN: skin color, texture, turgor are normal, no rashes or significant lesions EYES: normal, Conjunctiva are pink and non-injected, sclera clear OROPHARYNX:no exudate, no erythema and lips, buccal mucosa, and tongue normal  NECK: supple, thyroid normal size, non-tender, without nodularity LYMPH:  no palpable lymphadenopathy in the cervical, axillary or inguinal LUNGS: clear to auscultation and percussion with normal breathing effort HEART: regular rate & rhythm and no murmurs and no lower extremity edema ABDOMEN:abdomen soft, non-tender and normal bowel sounds Musculoskeletal:no cyanosis of digits and no clubbing  NEURO: alert & oriented x 3 with fluent speech, no focal motor/sensory deficits  LABORATORY DATA:  I have reviewed the data as listed    Component Value Date/Time   NA 141 11/28/2015 0959   NA 140 04/13/2014 1452   K 4.3 11/28/2015 0959   K 4.0 04/13/2014 1452   CL 102 04/13/2014 1452   CO2 27 11/28/2015 0959   CO2 31 04/13/2014 1452   GLUCOSE 113 11/28/2015 0959   GLUCOSE 110* 04/13/2014 1452   BUN 23.6 11/28/2015 0959   BUN 22 04/13/2014 1452   CREATININE 1.0 11/28/2015 0959   CREATININE 0.9 04/13/2014 1452   CALCIUM 9.5 11/28/2015 0959   CALCIUM 9.4 04/13/2014 1452   PROT 6.6 11/28/2015 0959   PROT 6.9 11/07/2012 0954   ALBUMIN 4.2 11/28/2015 0959   ALBUMIN 4.6 11/07/2012 0954   AST 17 11/28/2015 0959   AST 22 11/07/2012 0954   ALT 15 11/28/2015 0959   ALT 18 11/07/2012 0954   ALKPHOS  72 11/28/2015 0959   ALKPHOS 65 11/07/2012 0954   BILITOT 0.64 11/28/2015 0959   BILITOT 1.0 11/07/2012 0954    No results found for: SPEP, UPEP  Lab Results  Component Value Date   WBC 21.2* 11/28/2015   NEUTROABS 5.2 11/28/2015   HGB 13.9 11/28/2015    HCT 43.0 11/28/2015   MCV 90.6 11/28/2015   PLT 101* 11/28/2015      Chemistry      Component Value Date/Time   NA 141 11/28/2015 0959   NA 140 04/13/2014 1452   K 4.3 11/28/2015 0959   K 4.0 04/13/2014 1452   CL 102 04/13/2014 1452   CO2 27 11/28/2015 0959   CO2 31 04/13/2014 1452   BUN 23.6 11/28/2015 0959   BUN 22 04/13/2014 1452   CREATININE 1.0 11/28/2015 0959   CREATININE 0.9 04/13/2014 1452      Component Value Date/Time   CALCIUM 9.5 11/28/2015 0959   CALCIUM 9.4 04/13/2014 1452   ALKPHOS 72 11/28/2015 0959   ALKPHOS 65 11/07/2012 0954   AST 17 11/28/2015 0959   AST 22 11/07/2012 0954   ALT 15 11/28/2015 0959   ALT 18 11/07/2012 0954   BILITOT 0.64 11/28/2015 0959   BILITOT 1.0 11/07/2012 0954      ASSESSMENT & PLAN:  CLL (chronic lymphocytic leukemia) (HCC) Clinically, she has no signs of disease progression. Continue yearly history, physical examination and blood work.    Thrombocytopenia ([place]) This is related to CLL but she is not symptomatic. Continue observation only.    Essential hypertension, benign She has chronic hypertension. Her blood pressure is mildly elevated. She will continue close follow-up with PCP for medication adjustment.     No orders of the defined types were placed in this encounter.   All questions were answered. The patient knows to call the clinic with any problems, questions or concerns. No barriers to learning was detected. I spent 15 minutes counseling the patient face to face. The total time spent in the appointment was 20 minutes and more than 50% was on counseling and review of test results     [facility], [place], MD 11/28/2015 11:52 AM

## 2015-11-28 NOTE — Assessment & Plan Note (Signed)
This is related to CLL but she is not symptomatic. Continue observation only. 

## 2015-11-28 NOTE — Assessment & Plan Note (Signed)
Clinically, she has no signs of disease progression. Continue yearly history, physical examination and blood work.   

## 2015-11-28 NOTE — Assessment & Plan Note (Signed)
She has chronic hypertension. Her blood pressure is mildly elevated. She will continue close follow-up with PCP for medication adjustment. 

## 2015-12-19 ENCOUNTER — Other Ambulatory Visit: Payer: Self-pay | Admitting: Family

## 2016-01-03 ENCOUNTER — Other Ambulatory Visit: Payer: Self-pay | Admitting: Family

## 2016-03-25 ENCOUNTER — Other Ambulatory Visit: Payer: Self-pay | Admitting: Family Medicine

## 2016-04-12 DIAGNOSIS — R21 Rash and other nonspecific skin eruption: Secondary | ICD-10-CM | POA: Diagnosis not present

## 2016-04-17 ENCOUNTER — Ambulatory Visit (INDEPENDENT_AMBULATORY_CARE_PROVIDER_SITE_OTHER): Payer: PPO | Admitting: Family Medicine

## 2016-04-17 ENCOUNTER — Encounter: Payer: Self-pay | Admitting: Family Medicine

## 2016-04-17 VITALS — BP 130/80 | HR 87 | Temp 98.3°F | Ht 62.0 in | Wt 176.1 lb

## 2016-04-17 DIAGNOSIS — R21 Rash and other nonspecific skin eruption: Secondary | ICD-10-CM

## 2016-04-17 MED ORDER — TRIAMCINOLONE ACETONIDE 0.025 % EX OINT: 1.0000 "application " | TOPICAL_OINTMENT | Freq: Two times a day (BID) | CUTANEOUS | 0 refills | Status: AC

## 2016-04-17 NOTE — Progress Notes (Signed)
Pre visit review using our clinic review tool, if applicable. No additional management support is needed unless otherwise documented below in the visit note. 

## 2016-04-17 NOTE — Progress Notes (Signed)
HPI:  ACUTE VISIT:  Chief Complaint  Patient presents with  . Follow-up    left foot, given to shots of anitbiotic and also given an oral abx, Bactrim-still taking. She noticed it on Sunday, never opened up and bled, just stayed red & dry.    [name] is a 82 y.o. female, who is here today with her daughter to follow on recent acute care visit.  At the time of the visit I don't have records from her visit.   According to patient and her daughter, she presented to the acute care facility on 04/12/2016 because worsening erythematosus rash on left lower extremity. She first noted rash about 10 days ago, sudden onset, no pruritic or tender. She denies any numbness, tingling, or burning. No associated arthralgia or history of trauma. No fever, chills, or myalgias. According to patient she received a IM abx and Rx for Bactrim was given. She is reporting improvement of skin lesion, no new associated symptoms. She has tolerating medication well and denies any side effect.  She denies any new medication (prior to onset of rash), detergent, soap, or body product. No known insect bite or outdoor exposures to plants. She has been in [place] recently. No sick contact. No Hx of eczema or similar rash in the past.  She denies oral lesions/edema,cough, wheezing, dyspnea, abdominal pain, nausea, or vomiting.  She has a cast but she doesn't recall any scratching in that area.  Hx of CLL, she follows with oncologist, she denies any unusual bruising, nose or gum bleeding, gross hematuria, or blood in the stool.   Review of Systems  Constitutional: Negative for appetite change, chills, fatigue and fever.  HENT: Negative for congestion, facial swelling, mouth sores, nosebleeds, sore throat and trouble swallowing.   Eyes: Negative for discharge and redness.  Respiratory: Negative for cough, shortness of breath and wheezing.   Cardiovascular: Negative for chest pain, palpitations and leg  swelling.  Gastrointestinal: Negative for abdominal pain, blood in stool, diarrhea, nausea and vomiting.  Genitourinary: Negative for decreased urine volume and hematuria.  Musculoskeletal: Negative for arthralgias, back pain, gait problem, joint swelling and myalgias.  Skin: Positive for rash. Negative for wound.  Neurological: Negative for dizziness, syncope, weakness, numbness and headaches.  Hematological: Negative for adenopathy. Does not bruise/bleed easily.      Current Outpatient Prescriptions on File Prior to Visit  Medication Sig Dispense Refill  . atenolol (TENORMIN) 100 MG tablet TAKE ONE TABLET BY MOUTH ONE TIME DAILY 90 tablet 1  . losartan-hydrochlorothiazide (HYZAAR) 100-25 MG tablet TAKE ONE TABLET BY MOUTH ONE TIME DAILY 90 tablet 3   No current facility-administered medications on file prior to visit.      Past Medical History:  Diagnosis Date  . Arthritis    knee  . CLL (chronic lymphocytic leukemia) ([place]) 11/27/2013  . Hypertension   . Ulcer    stomach ulcer when young   No Known Allergies  Social History   Social History  . Marital status: Married    Spouse name: N/A  . Number of children: N/A  . Years of education: N/A   Social History Main Topics  . Smoking status: Former Smoker    Packs/day: 0.20    Years: 20.00    Types: Cigarettes    Quit date: 09/21/1978  . Smokeless tobacco: Never Used  . Alcohol use 4.2 oz/week    7 Standard drinks or equivalent per week  . Drug use: No  .  Sexual activity: Not Asked   Other Topics Concern  . None   Social History Narrative   Widowed April 2016. 3 children. 7 grandkids. 3 greatgrandkids.   Husband worked as Chief Executive Officer.       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  O2 sat at RA  96%.    Physical Exam  Constitutional: She is oriented to person, place, and time. She appears well-developed. She does not appear ill. No distress.  HENT:  Head: Atraumatic.  Eyes: Conjunctivae are normal.  Cardiovascular: Normal rate and regular rhythm.   Murmur: SEM I-II/VI base--known Hx. Pulses:      Dorsalis pedis pulses are 2+ on the left side.       Posterior tibial pulses are 2+ on the left side.  Respiratory: Effort normal and breath sounds normal. No respiratory distress.  Musculoskeletal: She exhibits no edema.  Lymphadenopathy:    She has no cervical adenopathy.  Neurological: She is alert and oriented to person, place, and time. She has normal strength. Coordination and gait normal.  Skin: Skin is warm. Rash noted. There is erythema.     Medial aspect of left ankle: confluent, erythematous maculopapular rash. No tenderness, no local heat, no induration. I see one or 2 pustular lesions, scaly  Psychiatric: She has a normal mood and affect.  Well groomed, good eye contact.      ASSESSMENT AND PLAN:     Maria Lowe was seen today for follow-up.  Diagnoses and all orders for this visit:  Rash and nonspecific skin eruption -     triamcinolone (KENALOG) 0.025 % ointment; Apply 1 application topically 2 (two) times daily.   I'm not sure about the etiology: viral, bacterial, vasculitis, dermititis among some mentioned.  Improving, so complete antibiotic treatment. Topical steroid ointment may also help, recommended use small amount for up to 2 weeks. Monitor for new symptoms, clearly instructed about warning signs. Follow-up as needed.      -[name] advised to return or notify a doctor immediately if symptoms worsen or persist or new concerns arise.       Martavion Couper G. [place], MD  [facility]. [place] office.

## 2016-04-17 NOTE — Patient Instructions (Signed)
A few things to remember from today's visit:   Rash and nonspecific skin eruption - Plan: triamcinolone (KENALOG) 0.025 % ointment  Since it  seems to be doing better continue current medication.  - topical steroid cream sent to the pharmacy, small amount at the time for up to 2 weeks. I don't think you need to follow unless you notice a new symptom or rash started getting worse.  Please be sure medication list is accurate. If a new problem present, please set up appointment sooner than planned today.

## 2016-06-19 ENCOUNTER — Other Ambulatory Visit: Payer: Self-pay | Admitting: Family Medicine

## 2016-06-25 ENCOUNTER — Encounter: Payer: Self-pay | Admitting: Family Medicine

## 2016-06-25 ENCOUNTER — Ambulatory Visit (INDEPENDENT_AMBULATORY_CARE_PROVIDER_SITE_OTHER): Payer: PPO | Admitting: Family Medicine

## 2016-06-25 VITALS — BP 142/78 | HR 70 | Temp 97.8°F | Wt 177.2 lb

## 2016-06-25 DIAGNOSIS — I10 Essential (primary) hypertension: Secondary | ICD-10-CM

## 2016-06-25 DIAGNOSIS — R739 Hyperglycemia, unspecified: Secondary | ICD-10-CM

## 2016-06-25 DIAGNOSIS — Z23 Encounter for immunization: Secondary | ICD-10-CM | POA: Diagnosis not present

## 2016-06-25 NOTE — Patient Instructions (Addendum)
Final pneumonia shot today (prevnar 13). Thanks for getting flu shot.   Labs before you go  Blood pressure slightly high in office but glad it looks good at home- no changes today  Update blood sugar test (gives [place] an average over 3 months)  Let me know if the hip pain does not continue to improve

## 2016-06-25 NOTE — Assessment & Plan Note (Signed)
S: at risk for diabetes. Limited activity but has lost weight and is down 7 lbs from last visit in january Lab Results  Component Value Date   HGBA1C 6.1 05/08/2013   A/P: update a1c today

## 2016-06-25 NOTE — Assessment & Plan Note (Signed)
S: controlled on atenolol 100mg  daily, losartan-hctz 100-25mg . Home #s regularly 130/80 BP Readings from Last 3 Encounters:  06/25/16 (!) 142/78  04/17/16 130/80  11/28/15 (!) 146/74  A/P:Continue current meds:  Technically at [facility] goal even in office and more tightly controlled at home

## 2016-06-25 NOTE — Progress Notes (Signed)
Subjective:  Maria Lowe is a 82 y.o. year old very pleasant female patient who presents for/with See problem oriented charting ROS- Left hip pain down to calf over several weeks that is slowly improving. Skin cold on feet in sandals. No chest pain or shortness of breath. No headache or blurry vision. see any ROS included in HPI as well.   Past Medical History-  Patient Active Problem List   Diagnosis Date Noted  . Thrombocytopenia ([place]) 11/26/2014    Priority: High  . CLL (chronic lymphocytic leukemia) ([place]) 11/27/2013    Priority: High  . Hyperglycemia 05/08/2013    Priority: Medium  . Essential hypertension, benign 11/07/2012    Priority: Medium  . Uterine prolapse 03/29/2015    Priority: Low  . Obesity, unspecified 11/07/2012    Priority: Low  . Former smoker 09/30/2015    Medications- reviewed and updated Current Outpatient Prescriptions  Medication Sig Dispense Refill  . atenolol (TENORMIN) 100 MG tablet TAKE ONE TABLET BY MOUTH ONE TIME DAILY 90 tablet 1  . losartan-hydrochlorothiazide (HYZAAR) 100-25 MG tablet TAKE ONE TABLET BY MOUTH ONE TIME DAILY 90 tablet 3   No current facility-administered medications for this visit.     Objective: BP (!) 142/78 (BP Location: Left Arm, Patient Position: Sitting, Cuff Size: Large)   Pulse 70   Temp 97.8 F (36.6 C) (Oral)   Wt 177 lb 3.2 oz (80.4 kg)   SpO2 98%   BMI 32.41 kg/m  Gen: NAD, resting comfortably CV: RRR no murmurs rubs or gallops Lungs: CTAB no crackles, wheeze, rhonchi Abdomen: obese Ext: no edema, 2+ DP and PT pulses Skin: warm, dry  Assessment/Plan:  Essential hypertension, benign S: controlled on atenolol 100mg  daily, losartan-hctz 100-25mg . Home #s regularly 130/80 BP Readings from Last 3 Encounters:  06/25/16 (!) 142/78  04/17/16 130/80  11/28/15 (!) 146/74  A/P:Continue current meds:  Technically at [facility] goal even in office and more tightly controlled at home  Hyperglycemia S: at risk for  diabetes. Limited activity but has lost weight and is down 7 lbs from last visit in january Lab Results  Component Value Date   HGBA1C 6.1 05/08/2013   A/P: update a1c today  Return in about 6 months (around 12/24/2016) for physical. 06/21/13 in [place] had pneumonia shot and at that time it is likely she was given pneumovax. We will give prevnar 13 today as final immunization. Discussed CPE as next visit option  Orders Placed This Encounter  Procedures  . Flu vaccine HIGH DOSE PF  . Pneumococcal conjugate vaccine 13-valent IM  . CBC    [place]  . Comprehensive metabolic panel    [place]  . Hemoglobin A1c    [place]    Return precautions advised.  [name], MD

## 2016-06-25 NOTE — Progress Notes (Signed)
Pre visit review using our clinic review tool, if applicable. No additional management support is needed unless otherwise documented below in the visit note. 

## 2016-06-26 ENCOUNTER — Telehealth: Payer: Self-pay

## 2016-06-26 LAB — COMPREHENSIVE METABOLIC PANEL
ALBUMIN: 4.4 g/dL (ref 3.5–5.2)
ALT: 16 U/L (ref 0–35)
AST: 20 U/L (ref 0–37)
Alkaline Phosphatase: 75 U/L (ref 39–117)
BILIRUBIN TOTAL: 0.6 mg/dL (ref 0.2–1.2)
BUN: 29 mg/dL — ABNORMAL HIGH (ref 6–23)
CALCIUM: 9.8 mg/dL (ref 8.4–10.5)
CO2: 31 meq/L (ref 19–32)
CREATININE: 0.94 mg/dL (ref 0.40–1.20)
Chloride: 102 mEq/L (ref 96–112)
GFR: 60.44 mL/min (ref >60.00)
Glucose, Bld: 103 mg/dL — ABNORMAL HIGH (ref 70–99)
Potassium: 4.5 mEq/L (ref 3.5–5.1)
SODIUM: 143 meq/L (ref 135–145)
Total Protein: 6.3 g/dL (ref 6.0–8.3)

## 2016-06-26 LAB — CBC
HEMATOCRIT: 40.5 % (ref 36.0–46.0)
HEMOGLOBIN: 13.7 g/dL (ref 12.0–15.0)
MCHC: 33.8 g/dL (ref 30.0–36.0)
MCV: 86.7 fl (ref 78.0–100.0)
Platelets: 132 10*3/uL — ABNORMAL LOW (ref 150.0–400.0)
RBC: 4.67 Mil/uL (ref 3.87–5.11)
RDW: 15.3 % (ref 11.5–15.5)

## 2016-06-26 LAB — HEMOGLOBIN A1C: Hgb A1c MFr Bld: 6 % (ref 4.6–6.5)

## 2016-06-26 NOTE — Telephone Encounter (Signed)
Critical Lab called at 10:54am.  WBC 21.8  Dr. Yong Channel notified @10 :56am

## 2016-10-29 ENCOUNTER — Encounter: Payer: Self-pay | Admitting: Family Medicine

## 2016-10-29 ENCOUNTER — Ambulatory Visit (INDEPENDENT_AMBULATORY_CARE_PROVIDER_SITE_OTHER): Payer: PPO | Admitting: Family Medicine

## 2016-10-29 VITALS — BP 120/88 | HR 65 | Temp 97.4°F | Ht 62.0 in | Wt 182.2 lb

## 2016-10-29 DIAGNOSIS — R21 Rash and other nonspecific skin eruption: Secondary | ICD-10-CM | POA: Diagnosis not present

## 2016-10-29 MED ORDER — TRIAMCINOLONE ACETONIDE 0.1 % EX CREA: 1.0000 "application " | TOPICAL_CREAM | Freq: Two times a day (BID) | CUTANEOUS | 0 refills | Status: DC

## 2016-10-29 NOTE — Progress Notes (Signed)
Pre visit review using our clinic review tool, if applicable. No additional management support is needed unless otherwise documented below in the visit note. 

## 2016-10-29 NOTE — Progress Notes (Signed)
Subjective:  Maria Lowe is a 83 y.o. year old very pleasant female patient who presents for/with See problem oriented charting ROS-not ill appearing, no fever/chills. No new medications. Not immunocompromised. No mucus membrane involvement.   Past Medical History-  Patient Active Problem List   Diagnosis Date Noted  . Thrombocytopenia ([place]) 11/26/2014    Priority: High  . CLL (chronic lymphocytic leukemia) ([place]) 11/27/2013    Priority: High  . Hyperglycemia 05/08/2013    Priority: Medium  . Essential hypertension, benign 11/07/2012    Priority: Medium  . Uterine prolapse 03/29/2015    Priority: Low  . Obesity, unspecified 11/07/2012    Priority: Low  . Former smoker 09/30/2015    Medications- reviewed and updated Current Outpatient Prescriptions  Medication Sig Dispense Refill  . atenolol (TENORMIN) 100 MG tablet TAKE ONE TABLET BY MOUTH ONE TIME DAILY 90 tablet 1  . losartan-hydrochlorothiazide (HYZAAR) 100-25 MG tablet TAKE ONE TABLET BY MOUTH ONE TIME DAILY 90 tablet 3   No current facility-administered medications for this visit.     Objective: BP 120/88 (BP Location: Left Arm, Patient Position: Sitting, Cuff Size: Large)   Pulse 65   Temp 97.4 F (36.3 C) (Oral)   Ht 5\' 2"  (1.575 m)   Wt 182 lb 3.2 oz (82.6 kg)   SpO2 97%   BMI 33.32 kg/m  Gen: NAD, resting comfortably, needs assist to get on table CV: RRR no murmurs rubs or gallops Lungs: CTAB no crackles, wheeze, rhonchi Abdomen: soft/nontender/nondistended  Ext: no edema pretibially on either leg Skin: warm, dry, erythematous rash on back/side of left lower leg. One area as noted in picture on upper right is more erythematous and scaling> I showed patient picture and she states all areas had been red like that one area a few days ago (improving). Neuro: intact sensation at site of rash, no pain over rash      Assessment/Plan:  Rash S: Patient with similar rash last summer after traveling to [place]  but wrapped around her leg and was thought to be cellulitis- treated with antibiotics at urgent care. Followed up here and was given triamcinolone. Within 2 weeks- complete resolution  Patient states that about a week ago she noted similar rash. Is not epxanding. Had a little bit of left over triamcinolone and put it on there. She was not sure if getting better or not- had looked at it with a mirror prior- we compared today when I showed her picture and she stated much better.   She denies new contacts or fall/injury. Denies pruritis or pain  A/P: rash of unclear etiology. Doubt shingles. Doubt cutaneous herpes simplex. Could be contact dermatitis or eczematous type rash.. Improving with triamcinolone but ran out- will refill. Discussed return precautions also please see avs.   Meds ordered this encounter  Medications  . triamcinolone cream (KENALOG) 0.1 %    Sig: Apply 1 application topically 2 (two) times daily. For 7-10 days maximum    Dispense:  80 g    Refill:  0    Return precautions advised.  [name], MD

## 2016-10-29 NOTE — Patient Instructions (Signed)
I am not sure what caused this (once again)  Does not look like bacterial infection.  If you have fever or worsening or expanding redness would want to see you back.   I want you to trial triamcinolone cream twice a day for a week. As long as slowly getting better that's ok. If still present in 3-4 weeks would like to see you back

## 2016-12-12 ENCOUNTER — Other Ambulatory Visit: Payer: Self-pay | Admitting: Family Medicine

## 2016-12-27 ENCOUNTER — Other Ambulatory Visit: Payer: Self-pay | Admitting: Hematology and Oncology

## 2016-12-27 DIAGNOSIS — C9110 Chronic lymphocytic leukemia of B-cell type not having achieved remission: Secondary | ICD-10-CM

## 2016-12-28 ENCOUNTER — Encounter: Payer: Self-pay | Admitting: Hematology and Oncology

## 2016-12-28 ENCOUNTER — Telehealth: Payer: Self-pay | Admitting: Hematology and Oncology

## 2016-12-28 ENCOUNTER — Other Ambulatory Visit (HOSPITAL_BASED_OUTPATIENT_CLINIC_OR_DEPARTMENT_OTHER): Payer: PPO

## 2016-12-28 ENCOUNTER — Ambulatory Visit (HOSPITAL_BASED_OUTPATIENT_CLINIC_OR_DEPARTMENT_OTHER): Payer: PPO | Admitting: Hematology and Oncology

## 2016-12-28 DIAGNOSIS — I10 Essential (primary) hypertension: Secondary | ICD-10-CM | POA: Diagnosis not present

## 2016-12-28 DIAGNOSIS — D696 Thrombocytopenia, unspecified: Secondary | ICD-10-CM | POA: Diagnosis not present

## 2016-12-28 DIAGNOSIS — C9110 Chronic lymphocytic leukemia of B-cell type not having achieved remission: Secondary | ICD-10-CM

## 2016-12-28 LAB — CBC WITH DIFFERENTIAL/PLATELET
BASO%: 0.1 % (ref 0.0–2.0)
Basophils Absolute: 0 10*3/uL (ref 0.0–0.1)
EOS%: 0.5 % (ref 0.0–7.0)
Eosinophils Absolute: 0.1 10*3/uL (ref 0.0–0.5)
HCT: 41.3 % (ref 34.8–46.6)
HGB: 13.6 g/dL (ref 11.6–15.9)
LYMPH%: 72.6 % — AB (ref 14.0–49.7)
MCH: 29.9 pg (ref 25.1–34.0)
MCHC: 32.9 g/dL (ref 31.5–36.0)
MCV: 90.8 fL (ref 79.5–101.0)
MONO#: 0.8 10*3/uL (ref 0.1–0.9)
MONO%: 3.2 % (ref 0.0–14.0)
NEUT%: 23.6 % — ABNORMAL LOW (ref 38.4–76.8)
NEUTROS ABS: 5.6 10*3/uL (ref 1.5–6.5)
Platelets: 98 10*3/uL — ABNORMAL LOW (ref 145–400)
RBC: 4.55 10*6/uL (ref 3.70–5.45)
RDW: 15 % — ABNORMAL HIGH (ref 11.2–14.5)
WBC: 23.7 10*3/uL — AB (ref 3.9–10.3)
lymph#: 17.2 10*3/uL — ABNORMAL HIGH (ref 0.9–3.3)
nRBC: 0 % (ref 0–0)

## 2016-12-28 LAB — TECHNOLOGIST REVIEW

## 2016-12-28 NOTE — Progress Notes (Signed)
[facility] OFFICE PROGRESS NOTE  Patient Care Team: [name], MD as PCP - General (Family Medicine) Heath Lark, MD as Consulting Physician (Hematology and Oncology)  SUMMARY OF ONCOLOGIC HISTORY:  This patient was diagnosed with CLL around 2009 from abnormal leukocytosis and mild thrombocytopenia. She is asymptomatic. She is being observed. FISH analysis in 2015 showed deletion 13 q.  INTERVAL HISTORY: Please see below for problem oriented charting. She feels well. Denies new lymphadenopathy.  No recent anorexia or weight loss. The patient denies any recent signs or symptoms of bleeding such as spontaneous epistaxis, hematuria or hematochezia. Denies recent infection  REVIEW OF SYSTEMS:   Constitutional: Denies fevers, chills or abnormal weight loss Eyes: Denies blurriness of vision Ears, nose, mouth, throat, and face: Denies mucositis or sore throat Respiratory: Denies cough, dyspnea or wheezes Cardiovascular: Denies palpitation, chest discomfort or lower extremity swelling Gastrointestinal:  Denies nausea, heartburn or change in bowel habits Skin: Denies abnormal skin rashes Lymphatics: Denies new lymphadenopathy or easy bruising Neurological:Denies numbness, tingling or new weaknesses Behavioral/Psych: Mood is stable, no new changes  All other systems were reviewed with the patient and are negative.  I have reviewed the past medical history, past surgical history, social history and family history with the patient and they are unchanged from previous note.  ALLERGIES:  has No Known Allergies.  MEDICATIONS:  Current Outpatient Prescriptions  Medication Sig Dispense Refill  . atenolol (TENORMIN) 100 MG tablet TAKE ONE TABLET BY MOUTH ONE TIME DAILY 90 tablet 1  . losartan-hydrochlorothiazide (HYZAAR) 100-25 MG tablet TAKE ONE TABLET BY MOUTH ONE TIME DAILY 90 tablet 3   No current facility-administered medications for this visit.     PHYSICAL  EXAMINATION: ECOG PERFORMANCE STATUS: 0 - Asymptomatic  Vitals:   12/28/16 1028  BP: (!) 180/67  Pulse: 64  Resp: 18  Temp: 98 F (36.7 C)   Filed Weights   12/28/16 1028  Weight: 178 lb 3.2 oz (80.8 kg)    GENERAL:alert, no distress and comfortable SKIN: skin color, texture, turgor are normal, no rashes or significant lesions EYES: normal, Conjunctiva are pink and non-injected, sclera clear OROPHARYNX:no exudate, no erythema and lips, buccal mucosa, and tongue normal  NECK: supple, thyroid normal size, non-tender, without nodularity LYMPH:  no palpable lymphadenopathy in the cervical, axillary or inguinal LUNGS: clear to auscultation and percussion with normal breathing effort HEART: regular rate & rhythm and no murmurs and no lower extremity edema ABDOMEN:abdomen soft, non-tender and normal bowel sounds Musculoskeletal:no cyanosis of digits and no clubbing  NEURO: alert & oriented x 3 with fluent speech, no focal motor/sensory deficits  LABORATORY DATA:  I have reviewed the data as listed    Component Value Date/Time   NA 143 06/25/2016 1541   NA 141 11/28/2015 0959   K 4.5 06/25/2016 1541   K 4.3 11/28/2015 0959   CL 102 06/25/2016 1541   CO2 31 06/25/2016 1541   CO2 27 11/28/2015 0959   GLUCOSE 103 (H) 06/25/2016 1541   GLUCOSE 113 11/28/2015 0959   BUN 29 (H) 06/25/2016 1541   BUN 23.6 11/28/2015 0959   CREATININE 0.94 06/25/2016 1541   CREATININE 1.0 11/28/2015 0959   CALCIUM 9.8 06/25/2016 1541   CALCIUM 9.5 11/28/2015 0959   PROT 6.3 06/25/2016 1541   PROT 6.6 11/28/2015 0959   ALBUMIN 4.4 06/25/2016 1541   ALBUMIN 4.2 11/28/2015 0959   AST 20 06/25/2016 1541   AST 17 11/28/2015 0959   ALT 16  06/25/2016 1541   ALT 15 11/28/2015 0959   ALKPHOS 75 06/25/2016 1541   ALKPHOS 72 11/28/2015 0959   BILITOT 0.6 06/25/2016 1541   BILITOT 0.64 11/28/2015 0959    No results found for: SPEP, UPEP  Lab Results  Component Value Date   WBC 21.8 Repeated and  verified X2. (HH) 06/25/2016   NEUTROABS 5.2 11/28/2015   HGB 13.7 06/25/2016   HCT 40.5 06/25/2016   MCV 86.7 06/25/2016   PLT 132.0 (L) 06/25/2016      Chemistry      Component Value Date/Time   NA 143 06/25/2016 1541   NA 141 11/28/2015 0959   K 4.5 06/25/2016 1541   K 4.3 11/28/2015 0959   CL 102 06/25/2016 1541   CO2 31 06/25/2016 1541   CO2 27 11/28/2015 0959   BUN 29 (H) 06/25/2016 1541   BUN 23.6 11/28/2015 0959   CREATININE 0.94 06/25/2016 1541   CREATININE 1.0 11/28/2015 0959      Component Value Date/Time   CALCIUM 9.8 06/25/2016 1541   CALCIUM 9.5 11/28/2015 0959   ALKPHOS 75 06/25/2016 1541   ALKPHOS 72 11/28/2015 0959   AST 20 06/25/2016 1541   AST 17 11/28/2015 0959   ALT 16 06/25/2016 1541   ALT 15 11/28/2015 0959   BILITOT 0.6 06/25/2016 1541   BILITOT 0.64 11/28/2015 0959       ASSESSMENT & PLAN:  CLL (chronic lymphocytic leukemia) (HCC) Clinically, she has no signs of disease progression. Continue yearly history, physical examination and blood work.    Thrombocytopenia ([place]) This is related to CLL but she is not symptomatic.  Even though the platelet count is slightly under 100,000, she is not symptomatic.  She does not need treatment unless it for closer to 50,000 Continue observation only.    Essential hypertension, benign She has whitecoat hypertension.  Her blood pressure measured in her primary care doctor's office recently was satisfactory.  She will continue same blood pressure medication without dose adjustment   No orders of the defined types were placed in this encounter.  All questions were answered. The patient knows to call the clinic with any problems, questions or concerns. No barriers to learning was detected. I spent 15 minutes counseling the patient face to face. The total time spent in the appointment was 20 minutes and more than 50% was on counseling and review of test results     Heath Lark, MD 12/28/2016 10:40 AM

## 2016-12-28 NOTE — Assessment & Plan Note (Signed)
She has whitecoat hypertension.  Her blood pressure measured in her primary care doctor's office recently was satisfactory.  She will continue same blood pressure medication without dose adjustment

## 2016-12-28 NOTE — Assessment & Plan Note (Signed)
Clinically, she has no signs of disease progression. Continue yearly history, physical examination and blood work.

## 2016-12-28 NOTE — Assessment & Plan Note (Addendum)
This is related to CLL but she is not symptomatic.  Even though the platelet count is slightly under 100,000, she is not symptomatic.  She does not need treatment unless it for closer to 50,000 Continue observation only. 

## 2016-12-28 NOTE — Telephone Encounter (Signed)
Gave patient AVS and calender per 12/28/2016 los.  

## 2017-01-07 ENCOUNTER — Encounter: Payer: Self-pay | Admitting: Family Medicine

## 2017-01-07 ENCOUNTER — Ambulatory Visit (INDEPENDENT_AMBULATORY_CARE_PROVIDER_SITE_OTHER): Payer: PPO | Admitting: Family Medicine

## 2017-01-07 DIAGNOSIS — I10 Essential (primary) hypertension: Secondary | ICD-10-CM

## 2017-01-07 MED ORDER — AMLODIPINE BESYLATE 5 MG PO TABS: 5.0000 mg | ORAL_TABLET | Freq: Every day | ORAL | 5 refills | Status: DC

## 2017-01-07 NOTE — Progress Notes (Signed)
Subjective:  Maria Lowe is a 83 y.o. year old very pleasant female patient who presents for/with See problem oriented charting ROS- No chest pain or shortness of breath. No headache or blurry vision. Some low back pain   Past Medical History-  Patient Active Problem List   Diagnosis Date Noted  . Thrombocytopenia ([place]) 11/26/2014    Priority: High  . CLL (chronic lymphocytic leukemia) ([place]) 11/27/2013    Priority: High  . Hyperglycemia 05/08/2013    Priority: Medium  . Essential hypertension, benign 11/07/2012    Priority: Medium  . Former smoker 09/30/2015    Priority: Low  . Uterine prolapse 03/29/2015    Priority: Low  . Obesity, unspecified 11/07/2012    Priority: Low  . Seborrheic dermatitis 01/08/2017    Medications- reviewed and updated Current Outpatient Prescriptions  Medication Sig Dispense Refill  . atenolol (TENORMIN) 100 MG tablet TAKE ONE TABLET BY MOUTH ONE TIME DAILY 90 tablet 1  . losartan-hydrochlorothiazide (HYZAAR) 100-25 MG tablet TAKE ONE TABLET BY MOUTH ONE TIME DAILY 90 tablet 3  . amLODipine (NORVASC) 5 MG tablet Take 1 tablet (5 mg total) by mouth daily. 30 tablet 5   No current facility-administered medications for this visit.     Objective: BP (!) 142/84 (BP Location: Left Arm, Patient Position: Sitting, Cuff Size: Large)   Pulse 75   Temp 97.7 F (36.5 C) (Oral)   Ht 5\' 2"  (1.575 m)   Wt 174 lb 12.8 oz (79.3 kg)   SpO2 94%   BMI 31.97 kg/m  Gen: NAD, resting comfortably, appears stated age CV: RRR no murmurs rubs or gallops Lungs: CTAB no crackles, wheeze, rhonchi Abdomen: soft/nontender/nondistended/normal bowel sounds.  Ext: no edema Skin: warm, dry  Assessment/Plan:  Ortho. Some LBP into left leg. 2 weeks in. Worse in AM. We discussed if not better in 6 weeks consider further workup  Essential hypertension, benign S: controlled poorly on atenolol 100mg  daily and losartan 100-25mg . Blood pressure 162/86 on my  Repeat. BP  higher for 2 weeks- seemed to be when pill type of losartan hctz  BP Readings from Last 3 Encounters:  01/07/17 (!) 142/84  12/28/16 (!) 180/67  10/29/16 120/88  A/P:Continue current meds:  But we did add amlodipien 5mg  to regimen with plan for 3 week recheck   Seborrheic dermatitis S: some on forehead down to sides of nose. Some scaling and irritation/flaking A/P: discussed hydrocortisone cream trial 1% over the counter. She asks about a more expensive name brand topical she heartd on tv for eczema- advised against for now  3 weeks  Meds ordered this encounter  Medications  . amLODipine (NORVASC) 5 MG tablet    Sig: Take 1 tablet (5 mg total) by mouth daily.    Dispense:  30 tablet    Refill:  5    Return precautions advised.  [name], MD

## 2017-01-07 NOTE — Patient Instructions (Signed)
Keep taking current medicines. Add amlodipine 5mg .   Check blood pressure daily- bring log with you to next visit as well as the cuff  For the issues on the scalp and beside nose- can try hydrocortisone cream 1% over the counter- can ask pharmacist to help you. Use twice a day for 1 week only. Then take at least a week off.   See me in 3 weeks

## 2017-01-07 NOTE — Progress Notes (Signed)
Pre visit review using our clinic review tool, if applicable. No additional management support is needed unless otherwise documented below in the visit note. 

## 2017-01-08 DIAGNOSIS — L219 Seborrheic dermatitis, unspecified: Secondary | ICD-10-CM | POA: Insufficient documentation

## 2017-01-08 NOTE — Assessment & Plan Note (Signed)
S: some on forehead down to sides of nose. Some scaling and irritation/flaking A/P: discussed hydrocortisone cream trial 1% over the counter. She asks about a more expensive name brand topical she heartd on tv for eczema- advised against for now

## 2017-01-08 NOTE — Assessment & Plan Note (Signed)
S: controlled poorly on atenolol 100mg  daily and losartan 100-25mg . Blood pressure 162/86 on my  Repeat. BP higher for 2 weeks- seemed to be when pill type of losartan hctz  BP Readings from Last 3 Encounters:  01/07/17 (!) 142/84  12/28/16 (!) 180/67  10/29/16 120/88  A/P:Continue current meds:  But we did add amlodipien 5mg  to regimen with plan for 3 week recheck

## 2017-01-29 ENCOUNTER — Encounter: Payer: Self-pay | Admitting: Family Medicine

## 2017-01-29 ENCOUNTER — Ambulatory Visit (INDEPENDENT_AMBULATORY_CARE_PROVIDER_SITE_OTHER): Payer: PPO | Admitting: Family Medicine

## 2017-01-29 VITALS — BP 142/72 | HR 69 | Temp 98.0°F | Ht 62.0 in | Wt 174.6 lb

## 2017-01-29 DIAGNOSIS — I10 Essential (primary) hypertension: Secondary | ICD-10-CM | POA: Diagnosis not present

## 2017-01-29 MED ORDER — AMLODIPINE BESYLATE 5 MG PO TABS: 5.0000 mg | ORAL_TABLET | Freq: Every day | ORAL | 3 refills | Status: DC

## 2017-01-29 NOTE — Patient Instructions (Signed)
Blood pressure is much better. Continue losartan-hctz 100-25mg , atenolol 100mg , amlodipine 5mg .   Follow up in 6 months unless you need [place] sooner

## 2017-01-29 NOTE — Progress Notes (Signed)
Subjective:  Maria Lowe is a 83 y.o. year old very pleasant female patient who presents for/with See problem oriented charting ROS- No chest pain or shortness of breath. No headache or blurry vision.    Past Medical History-  Patient Active Problem List   Diagnosis Date Noted  . Thrombocytopenia ([place]) 11/26/2014    Priority: High  . CLL (chronic lymphocytic leukemia) ([place]) 11/27/2013    Priority: High  . Hyperglycemia 05/08/2013    Priority: Medium  . Essential hypertension, benign 11/07/2012    Priority: Medium  . Former smoker 09/30/2015    Priority: Low  . Uterine prolapse 03/29/2015    Priority: Low  . Obesity, unspecified 11/07/2012    Priority: Low  . Seborrheic dermatitis 01/08/2017    Medications- reviewed and updated Current Outpatient Prescriptions  Medication Sig Dispense Refill  . amLODipine (NORVASC) 5 MG tablet Take 1 tablet (5 mg total) by mouth daily. 90 tablet 3  . atenolol (TENORMIN) 100 MG tablet TAKE ONE TABLET BY MOUTH ONE TIME DAILY 90 tablet 1  . losartan-hydrochlorothiazide (HYZAAR) 100-25 MG tablet TAKE ONE TABLET BY MOUTH ONE TIME DAILY 90 tablet 3   No current facility-administered medications for this visit.     Objective: BP (!) 142/72   Pulse 69   Temp 98 F (36.7 C) (Oral)   Ht 5\' 2"  (1.575 m)   Wt 174 lb 9.6 oz (79.2 kg)   SpO2 94%   BMI 31.93 kg/m  Gen: NAD, resting comfortably CV: RRR no murmurs rubs or gallops Lungs: CTAB no crackles, wheeze, rhonchi Ext: no edema Skin: warm, dry  Assessment/Plan:  Essential hypertension, benign S: controlled on  losartan HCTZ 100-25mg , atenolol 100mg , amlodipine 5 mg.  BP Readings from Last 3 Encounters:  01/29/17 (!) 142/72  01/07/17 (!) 142/84  12/28/16 (!) 180/67  A/P:Continue current meds:  SBP not controlled well in office but she brings log from last week with home cuff and all readyings <140/80. She is at goal per jnc8 in office as well- opted to keep current meds and follow up  in 6 months.    Return in about 6 months (around 08/01/2017) for follow up- or sooner if needed.   Meds ordered this encounter  Medications  . amLODipine (NORVASC) 5 MG tablet    Sig: Take 1 tablet (5 mg total) by mouth daily.    Dispense:  90 tablet    Refill:  3    Return precautions advised.  [name], MD

## 2017-01-29 NOTE — Assessment & Plan Note (Signed)
S: controlled on  losartan HCTZ 100-25mg , atenolol 100mg , amlodipine 5 mg.  BP Readings from Last 3 Encounters:  01/29/17 (!) 142/72  01/07/17 (!) 142/84  12/28/16 (!) 180/67  A/P:Continue current meds:  SBP not controlled well in office but she brings log from last week with home cuff and all readyings <140/80. She is at goal per jnc8 in office as well- opted to keep current meds and follow up in 6 months.

## 2017-03-12 ENCOUNTER — Other Ambulatory Visit: Payer: Self-pay | Admitting: Family Medicine

## 2017-04-19 ENCOUNTER — Ambulatory Visit (INDEPENDENT_AMBULATORY_CARE_PROVIDER_SITE_OTHER): Payer: PPO | Admitting: Internal Medicine

## 2017-04-19 ENCOUNTER — Encounter: Payer: Self-pay | Admitting: Internal Medicine

## 2017-04-19 VITALS — BP 146/72 | HR 72 | Temp 97.6°F | Ht 62.0 in | Wt 180.6 lb

## 2017-04-19 DIAGNOSIS — I10 Essential (primary) hypertension: Secondary | ICD-10-CM

## 2017-04-19 DIAGNOSIS — C9110 Chronic lymphocytic leukemia of B-cell type not having achieved remission: Secondary | ICD-10-CM

## 2017-04-19 DIAGNOSIS — C9190 Lymphoid leukemia, unspecified not having achieved remission: Secondary | ICD-10-CM

## 2017-04-19 DIAGNOSIS — R42 Dizziness and giddiness: Secondary | ICD-10-CM | POA: Diagnosis not present

## 2017-04-19 MED ORDER — LOSARTAN POTASSIUM 100 MG PO TABS: 100.0000 mg | ORAL_TABLET | Freq: Every day | ORAL | 3 refills | Status: DC

## 2017-04-19 NOTE — Patient Instructions (Signed)
Substitute losartan 100  For losartan/hydrochlorothiazide combination  Please check your blood pressure on a regular basis.  If it is consistently greater than 150/90, please make an office appointment.  Return in 3 months for follow-up  Drink as much fluid as you  can tolerate over the next few days  Report any new or worsening symptoms

## 2017-04-19 NOTE — Progress Notes (Signed)
Subjective:    Patient ID: Maria Lowe, female    DOB: 1931-08-25, 83 y.o.   MRN: [id number]  HPI 83 year old patient who has a history of multi-drug-resistant hypertension.  She also has been followed for stage I CLL which has been stable. 2 days ago while walking outdoors, she became quite lightheaded.  Due to dizziness, she stated that she leaned against a car for support and had near syncope.  She states her symptoms cleared in 2 or 3 seconds and she did not fall to the ground.  She had a similar episode one week ago while she was outdoors. No frank syncope. Blood pressure regimen includes diuretic therapy and  Amlodipine.  Today she states that she feels well She is accompanied by her daughter who states that her fluid intake is very modest. Denies any true vertigo   Past Medical History:  Diagnosis Date  . Arthritis    knee  . CLL (chronic lymphocytic leukemia) ([place]) 11/27/2013  . Hypertension   . Ulcer    stomach ulcer when young     Social History   Social History  . Marital status: Married    Spouse name: N/A  . Number of children: N/A  . Years of education: N/A   Occupational History  . Not on file.   Social History Main Topics  . Smoking status: Former Smoker    Packs/day: 0.20    Years: 20.00    Types: Cigarettes    Quit date: 09/21/1978  . Smokeless tobacco: Never Used  . Alcohol use 4.2 oz/week    7 Standard drinks or equivalent per week  . Drug use: No  . Sexual activity: Not on file   Other Topics Concern  . Not on file   Social History Narrative   Widowed April 2016. 3 children. 7 grandkids. 3 greatgrandkids.   Husband worked as Chief Executive Officer.       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 APPENDECTOMY    . TONSILLECTOMY AND ADENOIDECTOMY      Family History  Problem Relation Age of Onset   . Healthy Mother        died age 57 (communist country could not get records)  . Other Father        died young-unclear cause    No Known Allergies  Current Outpatient Prescriptions on File Prior to Visit  Medication Sig Dispense Refill  . amLODipine (NORVASC) 5 MG tablet Take 1 tablet (5 mg total) by mouth daily. 90 tablet 3  . atenolol (TENORMIN) 100 MG tablet TAKE ONE TABLET BY MOUTH ONE TIME DAILY 90 tablet 1   No current facility-administered medications on file prior to visit.     BP (!) 146/72 (BP Location: Left Arm, Patient Position: Sitting, Cuff Size: Normal)   Pulse 72   Temp 97.6 F (36.4 C) (Oral)   Ht 5\' 2"  (1.575 m)   Wt 180 lb 9.6 oz (81.9 kg)   SpO2 98%   BMI 33.03 kg/m      Review of Systems  Constitutional: Positive for fatigue.  Neurological: Positive for dizziness and light-headedness.       Objective:   Physical Exam  Constitutional: She is oriented to person, place, and time. She appears well-developed and well-nourished.  Blood pressure 140/76  No orthostatic change.  Elicited  O2 saturation 98%  HENT:  Head: Normocephalic.  Right Ear: External ear normal.  Left Ear: External ear normal.  Mouth/Throat: Oropharynx is clear and moist.  Eyes: Pupils are equal, round, and reactive to light. Conjunctivae and EOM are normal.  Neck: Normal range of motion. Neck supple. No thyromegaly present.  Cardiovascular: Normal rate, regular rhythm, normal heart sounds and intact distal pulses.   Rhythm is regular without ectopics No murmur  Pulmonary/Chest: Effort normal and breath sounds normal.  Abdominal: Soft. Bowel sounds are normal. She exhibits no mass. There is no tenderness.  Musculoskeletal: Normal range of motion.  Lymphadenopathy:    She has no cervical adenopathy.  Neurological: She is alert and oriented to person, place, and time.  Skin: Skin is warm and dry. No rash noted.  Psychiatric: She has a normal mood and affect. Her behavior  is normal.          Assessment & Plan:   Nonspecific dizziness.  Possible orthostatic hypotension.  We'll discontinue hydrochlorothiazide and observe on triple therapy Home blood pressure monitoring.  Encouraged The patient will liberalize her fluid intake  Check updated lab  Follow-up 3 months or as needed if symptoms recur  Nyoka Cowden

## 2017-04-20 LAB — COMPREHENSIVE METABOLIC PANEL
ALT: 11 U/L (ref 0–35)
AST: 16 U/L (ref 0–37)
Albumin: 4.5 g/dL (ref 3.5–5.2)
Alkaline Phosphatase: 68 U/L (ref 39–117)
BUN: 25 mg/dL — ABNORMAL HIGH (ref 6–23)
CHLORIDE: 103 meq/L (ref 96–112)
CO2: 30 mEq/L (ref 19–32)
CREATININE: 0.95 mg/dL (ref 0.40–1.20)
Calcium: 9.6 mg/dL (ref 8.4–10.5)
GFR: 59.59 mL/min — ABNORMAL LOW (ref >60.00)
Glucose, Bld: 107 mg/dL — ABNORMAL HIGH (ref 70–99)
POTASSIUM: 4.5 meq/L (ref 3.5–5.1)
SODIUM: 142 meq/L (ref 135–145)
Total Bilirubin: 0.8 mg/dL (ref 0.2–1.2)
Total Protein: 6.4 g/dL (ref 6.0–8.3)

## 2017-04-20 LAB — CBC WITH DIFFERENTIAL/PLATELET
BASOS ABS: 0.1 10*3/uL (ref 0.0–0.1)
BASOS PCT: 0.5 % (ref 0.0–3.0)
EOS ABS: 0.1 10*3/uL (ref 0.0–0.7)
Eosinophils Relative: 0.4 % (ref 0.0–5.0)
HEMATOCRIT: 42.5 % (ref 36.0–46.0)
Hemoglobin: 13.8 g/dL (ref 12.0–15.0)
Lymphocytes Relative: 77.1 % — ABNORMAL HIGH (ref 12.0–46.0)
Lymphs Abs: 16.5 10*3/uL — ABNORMAL HIGH (ref 0.7–4.0)
MCHC: 32.4 g/dL (ref 30.0–36.0)
MCV: 90.6 fl (ref 78.0–100.0)
Monocytes Absolute: 0.5 10*3/uL (ref 0.1–1.0)
Monocytes Relative: 2.3 % — ABNORMAL LOW (ref 3.0–12.0)
Neutro Abs: 4.2 10*3/uL (ref 1.4–7.7)
Neutrophils Relative %: 19.7 % — ABNORMAL LOW (ref 43.0–77.0)
Platelets: 119 10*3/uL — ABNORMAL LOW (ref 150.0–400.0)
RBC: 4.69 Mil/uL (ref 3.87–5.11)
RDW: 14.9 % (ref 11.5–15.5)

## 2017-04-20 LAB — TSH: TSH: 1.31 u[IU]/mL (ref 0.35–4.50)

## 2017-04-26 ENCOUNTER — Telehealth: Payer: Self-pay | Admitting: Family Medicine

## 2017-04-26 NOTE — Telephone Encounter (Signed)
° ° ° ° °  Pt saw Dr Raliegh Ip last Monday 04/19/17 for dizzyness and he changed her bp medicine to losartan . She said she is still having dizzyness would like a call back or do she need an appt    [phone]

## 2017-04-27 NOTE — Telephone Encounter (Signed)
Spoke with patient and scheduled her for Monday morning. I offered earlier appointments but she is unable to get here for those.

## 2017-05-03 ENCOUNTER — Encounter: Payer: Self-pay | Admitting: Family Medicine

## 2017-05-03 ENCOUNTER — Ambulatory Visit (INDEPENDENT_AMBULATORY_CARE_PROVIDER_SITE_OTHER): Payer: PPO | Admitting: Family Medicine

## 2017-05-03 VITALS — BP 120/82 | HR 74 | Temp 98.3°F | Ht 62.0 in | Wt 180.4 lb

## 2017-05-03 DIAGNOSIS — R42 Dizziness and giddiness: Secondary | ICD-10-CM | POA: Diagnosis not present

## 2017-05-03 DIAGNOSIS — I10 Essential (primary) hypertension: Secondary | ICD-10-CM | POA: Diagnosis not present

## 2017-05-03 MED ORDER — MECLIZINE HCL 12.5 MG PO TABS: 12.5000 mg | ORAL_TABLET | Freq: Three times a day (TID) | ORAL | 0 refills | Status: DC | PRN

## 2017-05-03 NOTE — Progress Notes (Signed)
Subjective:  Maria Lowe is a 83 y.o. year old very pleasant female patient who presents for/with See problem oriented charting ROS- no fever, chills, nausea, vomiting. No chest pain. No hearing loss above baseline. No tinnitus above baseline.    Past Medical History-  Patient Active Problem List   Diagnosis Date Noted  . Thrombocytopenia ([place]) 11/26/2014    Priority: High  . CLL (chronic lymphocytic leukemia) ([place]) 11/27/2013    Priority: High  . Hyperglycemia 05/08/2013    Priority: Medium  . Essential hypertension, benign 11/07/2012    Priority: Medium  . Former smoker 09/30/2015    Priority: Low  . Uterine prolapse 03/29/2015    Priority: Low  . Obesity, unspecified 11/07/2012    Priority: Low  . Seborrheic dermatitis 01/08/2017    Medications- reviewed and updated Current Outpatient Prescriptions  Medication Sig Dispense Refill  . amLODipine (NORVASC) 5 MG tablet Take 1 tablet (5 mg total) by mouth daily. 90 tablet 3  . atenolol (TENORMIN) 100 MG tablet TAKE ONE TABLET BY MOUTH ONE TIME DAILY 90 tablet 1  . losartan (COZAAR) 100 MG tablet Take 1 tablet (100 mg total) by mouth daily. 90 tablet 3  . meclizine (ANTIVERT) 12.5 MG tablet Take 1 tablet (12.5 mg total) by mouth 3 (three) times daily as needed for dizziness. 30 tablet 0   No current facility-administered medications for this visit.     Objective: BP 120/82 (BP Location: Left Arm, Patient Position: Sitting, Cuff Size: Large)   Pulse 74   Temp 98.3 F (36.8 C) (Oral)   Ht 5\' 2"  (1.575 m)   Wt 180 lb 6.4 oz (81.8 kg)   SpO2 97%   BMI 33.00 kg/m  Gen: NAD,  Well appearing but when standing appears slightly off balance CV: RRR no murmurs rubs or gallops Lungs: CTAB no crackles, wheeze, rhonchi Abdomen: soft/nontender/nondistended/normal bowel sounds.  Ext: no edema Skin: warm, dry Neuro: CN II-XII intact, sensation and reflexes normal throughout, 5/5 muscle strength in bilateral upper and lower  extremities. Normal finger to nose. Normal rapid alternating movements. No pronator drift. Normal romberg. Normal gait.  Dix hallpike with short lived nystagmus to the right  EKG: sinus rhtyhm with rate 69, left axis, LVH noted, no st or t wave changes  Assessment/Plan:  Dizziness/vertigo? - Plan: EKG 12-Lead, Ambulatory referral to Physical Therapy, MR Brain W Wo Contrast, CBC with Differential/Platelet, Comprehensive metabolic panel S:  Dizzy vs. vertigo for at least 3 weeks. Stopping hctz has not helped. (was seen here on 04/19/17 and this was removed to see if it would help). Worse with head or eye movement but seems to be present at all times at low level as well. " Feels like everythign in my head is moving" some language barrier potentially- in regards to not clearly being able to tell if this is vertigo. Has some baseline hearing loss but not worsened. No tinnitus or ear fullness. Slight frontal headache at times.  A/P: Unclear etiology.   EKG done to look for rhythm issus (dizzy at time of exam) with no rhythm issues noted  Orthostatics show drop in BP laying to sitting from 160 to 140 but would not lower BP meds She wants to switch back to hctz from amlodipine but could worsen orthostatic symptoms  Dix hallpike produces dizziness and nystagmus (though slight). Will attempt vestibular rehab. Daughter wanted this at home but not clear they would have set up at home to help with this. Trial meclizine.  With her age and hypertension as well as persistent symptoms (will get brain MR to rule out CVA- given duration would not be TIA). Should seek care immediately for new or worsening symptoms.   Essential hypertension, benign S: controlled poorly on amlodipine 5mg , atenolol 100mg , losartan 100mg . HCTZ stopped last visit. bp at home up to 160  BP Readings from Last 3 Encounters:  05/03/17 120/82  04/19/17 (!) 146/72  01/29/17 (!) 142/72  A/P: We discussed blood pressure goal of <150/90  due to orthostatic vital signs. On some of orthostatic measures and home BP- has been above 150. Hesitant to add HCTZ back because of 20 point drop in systolic- encouraged hydration but she states she has worked hard to remain well hydrated.  We discussed follow up within 2-3 days of MR as well as having at least a session with vestibular rehab under her belt unless worsens.   Orders Placed This Encounter  Procedures  . MR Brain W Wo Contrast    Epic order Labs drawn 7/30 in epic Wt 180/ht 5'2/no claus/no metal in eyes/no bullets/shrapnel/no hx of brain sx/no heart, eyes, ears sx/no diabetes/no liv/kid da/htn/nkda to cm/no lupus or RA/no implants/no needs Ins - health team advantage bl/pt    Standing Status:   Future    Standing Expiration Date:   07/03/2018    Order Specific Question:   If indicated for the ordered procedure, I authorize the administration of contrast media per Radiology protocol    Answer:   Yes    Order Specific Question:   What is the patient's sedation requirement?    Answer:   No Sedation    Order Specific Question:   Does the patient have a pacemaker or implanted devices?    Answer:   No    Order Specific Question:   Radiology Contrast Protocol - do NOT remove file path    Answer:   [email or web address]    Order Specific Question:   Preferred imaging location?    Answer:   [facility] (table limit-350 lbs)  . CBC with Differential/Platelet  . Comprehensive metabolic panel    [place]  . Ambulatory referral to Physical Therapy    Referral Priority:   Routine    Referral Type:   Physical Medicine    Referral Reason:   Specialty Services Required    Requested Specialty:   Physical Therapy    Number of Visits Requested:   1  . EKG 12-Lead    Meds ordered this encounter  Medications  . DISCONTD: losartan-hydrochlorothiazide (HYZAAR) 100-25 MG tablet    Sig: Take 1 tablet by mouth daily.    Refill:  3  . meclizine (ANTIVERT)  12.5 MG tablet    Sig: Take 1 tablet (12.5 mg total) by mouth 3 (three) times daily as needed for dizziness.    Dispense:  30 tablet    Refill:  0    Return precautions advised.  [name], MD

## 2017-05-03 NOTE — Patient Instructions (Addendum)
Please stop by lab before you go  We will call you within a week or two about your referral to vestbular rehab. If you do not hear within 3 weeks, give [place] a call. This could be due to benign vertigo but we are trying rule out stroke as well with mri  No changes in BP meds for now  Trial meclizine for dizziness

## 2017-05-03 NOTE — Assessment & Plan Note (Signed)
S: controlled poorly on amlodipine 5mg , atenolol 100mg , losartan 100mg . HCTZ stopped last visit. bp at home up to 160  BP Readings from Last 3 Encounters:  05/03/17 120/82  04/19/17 (!) 146/72  01/29/17 (!) 142/72  A/P: We discussed blood pressure goal of <150/90 due to orthostatic vital signs. On some of orthostatic measures and home BP- has been above 150. Hesitant to add HCTZ back because of 20 point drop in systolic- encouraged hydration but she states she has worked hard to remain well hydrated.

## 2017-05-09 ENCOUNTER — Ambulatory Visit
Admission: RE | Admit: 2017-05-09 | Discharge: 2017-05-09 | Disposition: A | Discharge status: Home / Self Care | Payer: PPO | Source: Ambulatory Visit | Attending: Family Medicine | Admitting: Family Medicine

## 2017-05-09 DIAGNOSIS — R42 Dizziness and giddiness: Secondary | ICD-10-CM

## 2017-05-09 MED ORDER — GADOBENATE DIMEGLUMINE 529 MG/ML IV SOLN
17.0000 mL | Freq: Once | INTRAVENOUS | Status: AC | PRN
  Administered 2017-05-09: 17 mL via INTRAVENOUS

## 2017-05-11 ENCOUNTER — Telehealth: Payer: Self-pay

## 2017-05-11 LAB — COMPREHENSIVE METABOLIC PANEL
ALK PHOS: 64 U/L (ref 39–117)
ALT: 19 U/L (ref 0–35)
AST: 20 U/L (ref 0–37)
Albumin: 4.1 g/dL (ref 3.5–5.2)
BILIRUBIN TOTAL: 0.6 mg/dL (ref 0.2–1.2)
BUN: 20 mg/dL (ref 6–23)
CALCIUM: 9.1 mg/dL (ref 8.4–10.5)
CO2: 33 meq/L — AB (ref 19–32)
CREATININE: 1.23 mg/dL — AB (ref 0.40–1.20)
Chloride: 107 mEq/L (ref 96–112)
GFR: 44.22 mL/min — ABNORMAL LOW (ref >60.00)
Glucose, Bld: 122 mg/dL — ABNORMAL HIGH (ref 70–99)
Potassium: 4.5 mEq/L (ref 3.5–5.1)
SODIUM: 145 meq/L (ref 135–145)
Total Protein: 6.1 g/dL (ref 6.0–8.3)

## 2017-05-11 LAB — CBC WITH DIFFERENTIAL/PLATELET
BASOS ABS: 0 10*3/uL (ref 0.0–0.1)
Basophils Relative: 0.1 % (ref 0.0–3.0)
EOS ABS: 0.1 10*3/uL (ref 0.0–0.7)
Eosinophils Relative: 0.5 % (ref 0.0–5.0)
HEMATOCRIT: 40.9 % (ref 36.0–46.0)
Hemoglobin: 13.3 g/dL (ref 12.0–15.0)
LYMPHS PCT: 79.3 % — AB (ref 12.0–46.0)
Lymphs Abs: 17.7 10*3/uL — ABNORMAL HIGH (ref 0.7–4.0)
MCHC: 32.6 g/dL (ref 30.0–36.0)
MCV: 90.1 fl (ref 78.0–100.0)
Monocytes Absolute: 0.5 10*3/uL (ref 0.1–1.0)
Monocytes Relative: 2.2 % — ABNORMAL LOW (ref 3.0–12.0)
NEUTROS ABS: 4 10*3/uL (ref 1.4–7.7)
NEUTROS PCT: 17.9 % — AB (ref 43.0–77.0)
PLATELETS: 116 10*3/uL — AB (ref 150.0–400.0)
RBC: 4.54 Mil/uL (ref 3.87–5.11)
RDW: 14.8 % (ref 11.5–15.5)
WBC: 22.3 10*3/uL (ref 4.0–10.5)

## 2017-05-11 NOTE — Telephone Encounter (Signed)
Spoke with patient who is requesting a refill on her meclizine (ANTIVERT) 12.5 MG tablet. It has not been 10 days since it was prescribed. Please advise

## 2017-05-11 NOTE — Telephone Encounter (Signed)
Yes thanks may fill, also has she seen vestibular rehab yet?

## 2017-05-13 ENCOUNTER — Telehealth: Payer: Self-pay | Admitting: Family Medicine

## 2017-05-13 NOTE — Telephone Encounter (Signed)
Called patient to schedule physical therapy. She had asked to wait until after her MRI. She stated that she needs in home physical therapy. She does not drive and she has been experiencing dizziness. I have cancelled the referral to [facility] physical therapy.

## 2017-05-17 ENCOUNTER — Other Ambulatory Visit: Payer: Self-pay

## 2017-05-17 MED ORDER — MECLIZINE HCL 12.5 MG PO TABS: 12.5000 mg | ORAL_TABLET | Freq: Three times a day (TID) | ORAL | 0 refills | Status: DC | PRN

## 2017-05-17 NOTE — Telephone Encounter (Signed)
Refilled prescription as requested. I called patient to let her know. She has not seen Vestibular Therapy yet, they called but she does not have transportation and could not get to their office. She also states her leg is swelling up again and she was wondering about adding the diuretic back into her medication. Please advise

## 2017-05-17 NOTE — Telephone Encounter (Signed)
Yes thanks she can add diuretic back (HCTZ) but have her stop it if worsens symptoms of lightheadedness or BP gets < 110/60

## 2017-05-19 NOTE — Telephone Encounter (Signed)
Spoke to patient and let her know that she can add medication back in. She verbalized understanding

## 2017-06-08 ENCOUNTER — Other Ambulatory Visit: Payer: Self-pay | Admitting: Family Medicine

## 2017-11-19 ENCOUNTER — Other Ambulatory Visit: Payer: Self-pay

## 2017-11-19 MED ORDER — ATENOLOL 100 MG PO TABS: 100.0000 mg | ORAL_TABLET | Freq: Every day | ORAL | 1 refills | Status: DC

## 2017-12-27 ENCOUNTER — Other Ambulatory Visit: Payer: Self-pay | Admitting: Hematology and Oncology

## 2017-12-27 DIAGNOSIS — C9110 Chronic lymphocytic leukemia of B-cell type not having achieved remission: Secondary | ICD-10-CM

## 2017-12-28 ENCOUNTER — Encounter: Payer: Self-pay | Admitting: Hematology and Oncology

## 2017-12-28 ENCOUNTER — Inpatient Hospital Stay: Payer: PPO | Attending: Hematology and Oncology

## 2017-12-28 ENCOUNTER — Inpatient Hospital Stay (HOSPITAL_BASED_OUTPATIENT_CLINIC_OR_DEPARTMENT_OTHER): Payer: PPO | Admitting: Hematology and Oncology

## 2017-12-28 ENCOUNTER — Telehealth: Payer: Self-pay | Admitting: Hematology and Oncology

## 2017-12-28 DIAGNOSIS — D6959 Other secondary thrombocytopenia: Secondary | ICD-10-CM | POA: Diagnosis not present

## 2017-12-28 DIAGNOSIS — C9190 Lymphoid leukemia, unspecified not having achieved remission: Secondary | ICD-10-CM

## 2017-12-28 DIAGNOSIS — I10 Essential (primary) hypertension: Secondary | ICD-10-CM | POA: Insufficient documentation

## 2017-12-28 DIAGNOSIS — C9110 Chronic lymphocytic leukemia of B-cell type not having achieved remission: Secondary | ICD-10-CM

## 2017-12-28 DIAGNOSIS — Z79899 Other long term (current) drug therapy: Secondary | ICD-10-CM | POA: Diagnosis not present

## 2017-12-28 DIAGNOSIS — D696 Thrombocytopenia, unspecified: Secondary | ICD-10-CM | POA: Diagnosis not present

## 2017-12-28 LAB — CBC WITH DIFFERENTIAL/PLATELET
BASOS PCT: 0 %
Basophils Absolute: 0 10*3/uL (ref 0.0–0.1)
EOS ABS: 0.1 10*3/uL (ref 0.0–0.5)
Eosinophils Relative: 1 %
HCT: 42.3 % (ref 34.8–46.6)
HEMOGLOBIN: 13.5 g/dL (ref 11.6–15.9)
Lymphocytes Relative: 76 %
Lymphs Abs: 17.4 10*3/uL — ABNORMAL HIGH (ref 0.9–3.3)
MCH: 29.4 pg (ref 25.1–34.0)
MCHC: 31.9 g/dL (ref 31.5–36.0)
MCV: 92.2 fL (ref 79.5–101.0)
Monocytes Absolute: 0.7 10*3/uL (ref 0.1–0.9)
Monocytes Relative: 3 %
NEUTROS PCT: 20 %
Neutro Abs: 4.5 10*3/uL (ref 1.5–6.5)
Platelets: 96 10*3/uL — ABNORMAL LOW (ref 145–400)
RBC: 4.59 MIL/uL (ref 3.70–5.45)
RDW: 15.1 % — ABNORMAL HIGH (ref 11.2–14.5)
WBC: 22.8 10*3/uL — AB (ref 3.9–10.3)

## 2017-12-28 LAB — COMPREHENSIVE METABOLIC PANEL
ALK PHOS: 62 U/L (ref 40–150)
ALT: 16 U/L (ref 0–55)
ANION GAP: 10 (ref 3–11)
AST: 18 U/L (ref 5–34)
Albumin: 4.1 g/dL (ref 3.5–5.0)
BILIRUBIN TOTAL: 0.5 mg/dL (ref 0.2–1.2)
BUN: 20 mg/dL (ref 7–26)
CALCIUM: 9.6 mg/dL (ref 8.4–10.4)
CO2: 27 mmol/L (ref 22–29)
Chloride: 104 mmol/L (ref 98–109)
Creatinine, Ser: 1.03 mg/dL (ref 0.60–1.10)
GFR, EST AFRICAN AMERICAN: 56 mL/min — AB (ref >60)
GFR, EST NON AFRICAN AMERICAN: 49 mL/min — AB (ref >60)
GLUCOSE: 126 mg/dL (ref 70–140)
Potassium: 4 mmol/L (ref 3.5–5.1)
Sodium: 141 mmol/L (ref 136–145)
TOTAL PROTEIN: 6.1 g/dL — AB (ref 6.4–8.3)

## 2017-12-28 LAB — LACTATE DEHYDROGENASE: LDH: 154 U/L (ref 125–245)

## 2017-12-28 NOTE — Telephone Encounter (Signed)
Gave patient AVS and calendar of upcoming august appointments °

## 2017-12-28 NOTE — Assessment & Plan Note (Signed)
Clinically, she has no signs of disease progression except for mild progression of thrombocytopenia.  She is not symptomatic The patient is educated to watch out for signs and symptoms of progression I plan to see her back in 4 months for further blood count monitoring

## 2017-12-28 NOTE — Assessment & Plan Note (Signed)
This is related to CLL but she is not symptomatic.  Even though the platelet count is slightly under 100,000, she is not symptomatic.  She does not need treatment unless it for closer to 50,000 Continue observation only.

## 2017-12-28 NOTE — Assessment & Plan Note (Signed)
She has whitecoat hypertension.  Her blood pressure measured in her primary care doctor's office recently was satisfactory.  She will continue same blood pressure medication without dose adjustment

## 2017-12-28 NOTE — Progress Notes (Signed)
[facility] OFFICE PROGRESS NOTE  Patient Care Team: [name], MD as PCP - General (Family Medicine) Heath Lark, MD as Consulting Physician (Hematology and Oncology)  ASSESSMENT & PLAN:  CLL (chronic lymphocytic leukemia) ([place]) Clinically, she has no signs of disease progression except for mild progression of thrombocytopenia.  She is not symptomatic The patient is educated to watch out for signs and symptoms of progression I plan to see her back in 4 months for further blood count monitoring  Thrombocytopenia ([place]) This is related to CLL but she is not symptomatic.  Even though the platelet count is slightly under 100,000, she is not symptomatic.  She does not need treatment unless it for closer to 50,000 Continue observation only.  Essential hypertension, benign She has whitecoat hypertension.  Her blood pressure measured in her primary care doctor's office recently was satisfactory.  She will continue same blood pressure medication without dose adjustment   No orders of the defined types were placed in this encounter.   INTERVAL HISTORY: Please see below for problem oriented charting. She returns for further follow-up She denies recent infection No new lymphadenopathy The patient denies any recent signs or symptoms of bleeding such as spontaneous epistaxis, hematuria or hematochezia.  SUMMARY OF ONCOLOGIC HISTORY:  This patient was diagnosed with CLL around 2009 from abnormal leukocytosis and mild thrombocytopenia. She is asymptomatic. She is being observed. FISH analysis in 2015 showed deletion 13 q.  REVIEW OF SYSTEMS:   Constitutional: Denies fevers, chills or abnormal weight loss Eyes: Denies blurriness of vision Ears, nose, mouth, throat, and face: Denies mucositis or sore throat Respiratory: Denies cough, dyspnea or wheezes Cardiovascular: Denies palpitation, chest discomfort or lower extremity swelling Gastrointestinal:  Denies nausea,  heartburn or change in bowel habits Skin: Denies abnormal skin rashes Lymphatics: Denies new lymphadenopathy or easy bruising Neurological:Denies numbness, tingling or new weaknesses Behavioral/Psych: Mood is stable, no new changes  All other systems were reviewed with the patient and are negative.  I have reviewed the past medical history, past surgical history, social history and family history with the patient and they are unchanged from previous note.  ALLERGIES:  has No Known Allergies.  MEDICATIONS:  Current Outpatient Medications  Medication Sig Dispense Refill  . amLODipine (NORVASC) 5 MG tablet Take 1 tablet (5 mg total) by mouth daily. 90 tablet 3  . atenolol (TENORMIN) 100 MG tablet Take 1 tablet (100 mg total) by mouth daily. 90 tablet 1  . losartan (COZAAR) 100 MG tablet Take 1 tablet (100 mg total) by mouth daily. 90 tablet 3  . meclizine (ANTIVERT) 12.5 MG tablet Take 1 tablet (12.5 mg total) by mouth 3 (three) times daily as needed for dizziness. 30 tablet 0   No current facility-administered medications for this visit.     PHYSICAL EXAMINATION: ECOG PERFORMANCE STATUS: 0 - Asymptomatic  Vitals:   12/28/17 0849  BP: (!) 178/58  Pulse: 66  Resp: 18  Temp: 98.4 F (36.9 C)  SpO2: 96%   Filed Weights   12/28/17 0849  Weight: 178 lb 1.6 oz (80.8 kg)    GENERAL:alert, no distress and comfortable SKIN: skin color, texture, turgor are normal, no rashes or significant lesions EYES: normal, Conjunctiva are pink and non-injected, sclera clear OROPHARYNX:no exudate, no erythema and lips, buccal mucosa, and tongue normal  NECK: supple, thyroid normal size, non-tender, without nodularity LYMPH:  no palpable lymphadenopathy in the cervical, axillary or inguinal LUNGS: clear to auscultation and percussion with normal breathing  effort HEART: regular rate & rhythm and no murmurs and no lower extremity edema ABDOMEN:abdomen soft, non-tender and normal bowel  sounds Musculoskeletal:no cyanosis of digits and no clubbing  NEURO: alert & oriented x 3 with fluent speech, no focal motor/sensory deficits  LABORATORY DATA:  I have reviewed the data as listed    Component Value Date/Time   NA 145 05/11/2017 1429   NA 141 11/28/2015 0959   K 4.5 05/11/2017 1429   K 4.3 11/28/2015 0959   CL 107 05/11/2017 1429   CO2 33 (H) 05/11/2017 1429   CO2 27 11/28/2015 0959   GLUCOSE 122 (H) 05/11/2017 1429   GLUCOSE 113 11/28/2015 0959   BUN 20 05/11/2017 1429   BUN 23.6 11/28/2015 0959   CREATININE 1.23 (H) 05/11/2017 1429   CREATININE 1.0 11/28/2015 0959   CALCIUM 9.1 05/11/2017 1429   CALCIUM 9.5 11/28/2015 0959   PROT 6.1 05/11/2017 1429   PROT 6.6 11/28/2015 0959   ALBUMIN 4.1 05/11/2017 1429   ALBUMIN 4.2 11/28/2015 0959   AST 20 05/11/2017 1429   AST 17 11/28/2015 0959   ALT 19 05/11/2017 1429   ALT 15 11/28/2015 0959   ALKPHOS 64 05/11/2017 1429   ALKPHOS 72 11/28/2015 0959   BILITOT 0.6 05/11/2017 1429   BILITOT 0.64 11/28/2015 0959    No results found for: SPEP, UPEP  Lab Results  Component Value Date   WBC 22.8 (H) 12/28/2017   NEUTROABS 4.5 12/28/2017   HGB 13.5 12/28/2017   HCT 42.3 12/28/2017   MCV 92.2 12/28/2017   PLT 96 (L) 12/28/2017      Chemistry      Component Value Date/Time   NA 145 05/11/2017 1429   NA 141 11/28/2015 0959   K 4.5 05/11/2017 1429   K 4.3 11/28/2015 0959   CL 107 05/11/2017 1429   CO2 33 (H) 05/11/2017 1429   CO2 27 11/28/2015 0959   BUN 20 05/11/2017 1429   BUN 23.6 11/28/2015 0959   CREATININE 1.23 (H) 05/11/2017 1429   CREATININE 1.0 11/28/2015 0959      Component Value Date/Time   CALCIUM 9.1 05/11/2017 1429   CALCIUM 9.5 11/28/2015 0959   ALKPHOS 64 05/11/2017 1429   ALKPHOS 72 11/28/2015 0959   AST 20 05/11/2017 1429   AST 17 11/28/2015 0959   ALT 19 05/11/2017 1429   ALT 15 11/28/2015 0959   BILITOT 0.6 05/11/2017 1429   BILITOT 0.64 11/28/2015 0959       All  questions were answered. The patient knows to call the clinic with any problems, questions or concerns. No barriers to learning was detected.  I spent 10 minutes counseling the patient face to face. The total time spent in the appointment was 15 minutes and more than 50% was on counseling and review of test results  Heath Lark, MD 12/28/2017 9:05 AM

## 2018-01-20 ENCOUNTER — Ambulatory Visit: Payer: PPO | Admitting: Family Medicine

## 2018-01-20 ENCOUNTER — Other Ambulatory Visit: Payer: Self-pay | Admitting: Family Medicine

## 2018-01-20 MED ORDER — AMLODIPINE BESYLATE 5 MG PO TABS: 5.0000 mg | ORAL_TABLET | Freq: Every day | ORAL | 3 refills | Status: DC

## 2018-01-20 MED ORDER — ATENOLOL 100 MG PO TABS: 100.0000 mg | ORAL_TABLET | Freq: Every day | ORAL | 3 refills | Status: DC

## 2018-01-20 MED ORDER — LOSARTAN POTASSIUM 100 MG PO TABS: 100.0000 mg | ORAL_TABLET | Freq: Every day | ORAL | 3 refills | Status: DC

## 2018-01-20 NOTE — Progress Notes (Signed)
Refilled meds. Had med refill appt today and I was running an hour behind and she had to leave due to daughter needing to get to an appointment. We have fullow up in july

## 2018-02-03 ENCOUNTER — Ambulatory Visit: Payer: Self-pay

## 2018-02-03 ENCOUNTER — Encounter: Payer: Self-pay | Admitting: Family Medicine

## 2018-02-03 ENCOUNTER — Ambulatory Visit (INDEPENDENT_AMBULATORY_CARE_PROVIDER_SITE_OTHER): Payer: PPO | Admitting: Family Medicine

## 2018-02-03 ENCOUNTER — Telehealth: Payer: Self-pay

## 2018-02-03 VITALS — BP 154/92 | HR 68 | Temp 98.0°F | Ht 62.0 in | Wt 173.8 lb

## 2018-02-03 DIAGNOSIS — I10 Essential (primary) hypertension: Secondary | ICD-10-CM | POA: Diagnosis not present

## 2018-02-03 LAB — CBC WITH DIFFERENTIAL/PLATELET
Basophils Absolute: 0 10*3/uL (ref 0.0–0.1)
Basophils Relative: 0.1 % (ref 0.0–3.0)
EOS ABS: 0 10*3/uL (ref 0.0–0.7)
Eosinophils Relative: 0.2 % (ref 0.0–5.0)
HCT: 44.2 % (ref 36.0–46.0)
HEMOGLOBIN: 14.7 g/dL (ref 12.0–15.0)
LYMPHS ABS: 17.6 10*3/uL — AB (ref 0.7–4.0)
Lymphocytes Relative: 73.3 % — ABNORMAL HIGH (ref 12.0–46.0)
MCHC: 33.1 g/dL (ref 30.0–36.0)
MCV: 88.5 fl (ref 78.0–100.0)
MONO ABS: 0.6 10*3/uL (ref 0.1–1.0)
Monocytes Relative: 2.7 % — ABNORMAL LOW (ref 3.0–12.0)
NEUTROS PCT: 23.7 % — AB (ref 43.0–77.0)
Neutro Abs: 5.7 10*3/uL (ref 1.4–7.7)
Platelets: 115 10*3/uL — ABNORMAL LOW (ref 150.0–400.0)
RBC: 5 Mil/uL (ref 3.87–5.11)
RDW: 15.6 % — AB (ref 11.5–15.5)

## 2018-02-03 LAB — BASIC METABOLIC PANEL
BUN: 24 mg/dL — ABNORMAL HIGH (ref 6–23)
CO2: 30 meq/L (ref 19–32)
Calcium: 9.9 mg/dL (ref 8.4–10.5)
Chloride: 101 mEq/L (ref 96–112)
Creatinine, Ser: 0.99 mg/dL (ref 0.40–1.20)
GFR: 56.71 mL/min — AB (ref >60.00)
GLUCOSE: 128 mg/dL — AB (ref 70–99)
POTASSIUM: 4.3 meq/L (ref 3.5–5.1)
SODIUM: 141 meq/L (ref 135–145)

## 2018-02-03 MED ORDER — LOSARTAN POTASSIUM-HCTZ 100-25 MG PO TABS
1.0000 | ORAL_TABLET | Freq: Every day | ORAL | 3 refills | Status: DC
Start: 2018-02-03 — End: 2018-07-27

## 2018-02-03 MED ORDER — AMLODIPINE BESYLATE 10 MG PO TABS: 10.0000 mg | ORAL_TABLET | Freq: Every day | ORAL | 3 refills | Status: DC

## 2018-02-03 NOTE — Telephone Encounter (Signed)
Called patient to let her know I scheduled her AWV on July 18th after her physical. I got no answer but I left a message to call office back

## 2018-02-03 NOTE — Telephone Encounter (Signed)
Noted. Patient scheduled today at 1:00pm

## 2018-02-03 NOTE — Progress Notes (Signed)
Subjective:  Maria Lowe is a 84 y.o. year old very pleasant female patient who presents for/with See problem oriented charting ROS- No chest pain or shortness of breath. No headache (but noted some headache when BP was up to 190) or blurry vision.    Past Medical History-  Patient Active Problem List   Diagnosis Date Noted  . Thrombocytopenia ([place]) 11/26/2014    Priority: High  . CLL (chronic lymphocytic leukemia) ([place]) 11/27/2013    Priority: High  . Hyperglycemia 05/08/2013    Priority: Medium  . Essential hypertension, benign 11/07/2012    Priority: Medium  . Former smoker 09/30/2015    Priority: Low  . Uterine prolapse 03/29/2015    Priority: Low  . Obesity, unspecified 11/07/2012    Priority: Low  . Seborrheic dermatitis 01/08/2017    Medications- reviewed and updated Current Outpatient Medications  Medication Sig Dispense Refill  . amLODipine (NORVASC) 5 MG tablet Take 1 tablet (5 mg total) by mouth daily. 90 tablet 3  . atenolol (TENORMIN) 100 MG tablet Take 1 tablet (100 mg total) by mouth daily. 90 tablet 3  . losartan (COZAAR) 100 MG tablet Take 1 tablet (100 mg total) by mouth daily. 90 tablet 3   No current facility-administered medications for this visit.     Objective: BP (!) 154/92 (BP Location: Left Arm, Patient Position: Sitting, Cuff Size: Normal)   Pulse 68   Temp 98 F (36.7 C) (Oral)   Ht 5\' 2"  (1.575 m)   Wt 173 lb 12.8 oz (78.8 kg)   SpO2 98%   BMI 31.79 kg/m  Gen: NAD, resting comfortably CV: RRR no murmurs rubs or gallops Lungs: CTAB no crackles, wheeze, rhonchi Abdomen: soft/nontender/nondistended/normal bowel sounds. Ext: no edema Skin: warm, dry Neuro: normal speech, accent noted  Assessment/Plan: Essential hypertension, benign S: controlled on amlodipine 5mg , atenolol 100mg , losartan 100mg  at last visit. She ended up adding HCTZ back into her medication regimen on 05/11/17 due to edema.   Her arm cuff broke- has been using wrist  cuff. Home #s on Sunday were up to 190. Ranging from 120-190  Today 155/119 on first check with wrist down. With arm up 175/95. I actually got 175/100 when I rechecked- some anxiety with me in the room BP Readings from Last 3 Encounters:  02/03/18 (!) 154/92  12/28/17 (!) 178/58  05/03/17 120/82  A/P: We discussed blood pressure goal of <140/90 or at least <150/90. Continue current meds but-->  "Increase amlodipine to 10mg   Continue losartan-hctz 100-25mg   Continue atenolol 100mg   Lets see each other back next week. Have the front desk schedule you for 9 30 AM next week in same day slot. Lets follow this up closely.   I would prefer for you to get a arm cuff if possible. I like omron brand if possible but im really ok with any updated one- ask your pharmacist for what they recommend. "   Future Appointments  Date Time Provider [place]  04/07/2018  9:30 AM [name], MD [facility]  04/26/2018  8:45 AM [facility] LAB 6 [facility] None  04/26/2018  9:15 AM Heath Lark, MD [facility] None   Lab/Order associations: Essential hypertension, benign - Plan: CBC with Differential/Platelet, Basic metabolic panel  Meds ordered this encounter  Medications  . losartan-hydrochlorothiazide (HYZAAR) 100-25 MG tablet    Sig: Take 1 tablet by mouth daily.    Dispense:  90 tablet    Refill:  3    Please disregard  prior prescription for losartan 100mg  alone.  Marland Kitchen amLODipine (NORVASC) 10 MG tablet    Sig: Take 1 tablet (10 mg total) by mouth daily.    Dispense:  90 tablet    Refill:  3   Return precautions advised.  [name], MD

## 2018-02-03 NOTE — Progress Notes (Signed)
Your CBC was stable (blood counts, infection fighting cells, platelets) for you given known CLL Your CMET was stable (kidney, liver, and electrolytes, blood sugar)  with some loss of kidney function but not worsening.  We will continue to monitor every 6 months or so.

## 2018-02-03 NOTE — Telephone Encounter (Signed)
CRITICAL VALUE STICKER  CRITICAL VALUE:WBC  RECEIVER (on-site recipient of call):Eddie Dibbles  DATE & TIME NOTIFIED: 02/03/18 @ 04:15pm  MESSENGER (representative from lab):  MD NOTIFIED:Dr. [name]  TIME OF NOTIFICATION:04:15pm  RESPONSE: Dr. Yong Channel stated patient has CLL and this is not an unusual reading for her

## 2018-02-03 NOTE — Patient Instructions (Addendum)
Increase amlodipine to 10mg   Continue losartan-hctz 100-25mg   Continue atenolol 100mg   Lets see each other back next week. Have the front desk schedule you for 9 30 AM next week in same day slot. Lets follow this up closely.   I would prefer for you to get a arm cuff if possible. I like omron brand if possible but im really ok with any updated one- ask your pharmacist for what they recommend.   At some point, I would also like for you to sign up for an annual wellness visit with oneof our nurses, Cassie or Manuela Schwartz, who both specialize in the annual wellness visit. This is a free benefit under medicare that may help [place] findadditional ways to help you. Some highlights are reviewing medications, lifestyle, and doing a dementia screen.  Please stop by lab before you go

## 2018-02-03 NOTE — Assessment & Plan Note (Signed)
S: controlled on amlodipine 5mg , atenolol 100mg , losartan 100mg  at last visit. She ended up adding HCTZ back into her medication regimen on 05/11/17 due to edema.   Her arm cuff broke- has been using wrist cuff. Home #s on Sunday were up to 190. Ranging from 120-190  Today 155/119 on first check with wrist down. With arm up 175/95. I actually got 175/100 when I rechecked- some anxiety with me in the room BP Readings from Last 3 Encounters:  02/03/18 (!) 154/92  12/28/17 (!) 178/58  05/03/17 120/82  A/P: We discussed blood pressure goal of <140/90 or at least <150/90. Continue current meds but-->  "Increase amlodipine to 10mg   Continue losartan-hctz 100-25mg   Continue atenolol 100mg   Lets see each other back next week. Have the front desk schedule you for 9 30 AM next week in same day slot. Lets follow this up closely.   I would prefer for you to get a arm cuff if possible. I like omron brand if possible but im really ok with any updated one- ask your pharmacist for what they recommend. "

## 2018-02-03 NOTE — Telephone Encounter (Signed)
Pt. Reports she started having elevated BP readings at home Monday. Does have a new monitor. Monday - 190/85   Wednesday  187/85  This morning  177/95. Denies headache or blurred vision. Reports she does have a "noisy feeling" in her ears. Reports she is going to [place] the end of this month and wants to be sure "everything is ok." Appointment made for today with her provider.  Reason for Disposition . Systolic BP  >= 626 OR Diastolic >= 948  Answer Assessment - Initial Assessment Questions 1. BLOOD PRESSURE: "What is the blood pressure?" "Did you take at least two measurements 5 minutes apart?"     177/95 2. ONSET: "When did you take your blood pressure?"      0700 3. HOW: "How did you obtain the blood pressure?" (e.g., visiting nurse, automatic home BP monitor)     Home BP 4. HISTORY: "Do you have a history of high blood pressure?"     Yes 5. MEDICATIONS: "Are you taking any medications for blood pressure?" "Have you missed any doses recently?"     Yes - no missed doses 6. OTHER SYMPTOMS: "Do you have any symptoms?" (e.g., headache, chest pain, blurred vision, difficulty breathing, weakness)     Ears feel "noisy". 7. PREGNANCY: "Is there any chance you are pregnant?" "When was your last menstrual period?"     No  Protocols used: HIGH BLOOD PRESSURE-A-AH

## 2018-02-09 ENCOUNTER — Encounter: Payer: Self-pay | Admitting: Family Medicine

## 2018-02-09 ENCOUNTER — Ambulatory Visit (INDEPENDENT_AMBULATORY_CARE_PROVIDER_SITE_OTHER): Payer: PPO | Admitting: Family Medicine

## 2018-02-09 VITALS — BP 136/68 | HR 69 | Temp 97.8°F | Ht 62.0 in | Wt 176.4 lb

## 2018-02-09 DIAGNOSIS — N183 Chronic kidney disease, stage 3 unspecified: Secondary | ICD-10-CM | POA: Insufficient documentation

## 2018-02-09 DIAGNOSIS — R21 Rash and other nonspecific skin eruption: Secondary | ICD-10-CM | POA: Insufficient documentation

## 2018-02-09 DIAGNOSIS — I10 Essential (primary) hypertension: Secondary | ICD-10-CM

## 2018-02-09 MED ORDER — TRIAMCINOLONE ACETONIDE 0.1 % EX CREA: 1.0000 "application " | TOPICAL_CREAM | Freq: Two times a day (BID) | CUTANEOUS | 0 refills | Status: DC

## 2018-02-09 MED ORDER — DICLOFENAC SODIUM 1 % TD GEL: 2.0000 g | Freq: Four times a day (QID) | TRANSDERMAL | 3 refills | Status: DC

## 2018-02-09 NOTE — Assessment & Plan Note (Signed)
S: controlled on amlodipine 10 mg (increased last week), atenolol 100 mg, losartan-HCTZ 100-25 mg  I encouraged her last week to get a arm cuff to use instead of a wrist cuff. It was 137/75 on her wrist cuff before comin in today- she didn't get arm cuff yet. Her cuff last visit was actually pretty close to our reading BP Readings from Last 3 Encounters:  02/09/18 136/68  02/03/18 (!) 154/92  12/28/17 (!) 178/58  A/P: We discussed blood pressure goal of <140/90. Continue current meds. Cutting aleve may help as well

## 2018-02-09 NOTE — Assessment & Plan Note (Signed)
S: I informed patient of diagnosis.  I asked her about any NSAID use.  for right knee-Using aleve daily. Knee brace neoprene helps  A/P:We discussed GFR being below 60 on several occasions in the last few years and diagnosis of CKD stage III.  We discussed avoiding oral NSAIDs-can try Voltaren gel.  Also can trial Tylenol 8-hour-she seems to not like Tylenol for some reason.

## 2018-02-09 NOTE — Progress Notes (Signed)
Subjective:  Maria Lowe is a 84 y.o. year old very pleasant female patient who presents for/with See problem oriented charting ROS- No chest pain or shortness of breath. No headache or blurry vision. Admits to itchy bug bites at times or bad reactions to poision ivy   Past Medical History-  Patient Active Problem List   Diagnosis Date Noted  . Thrombocytopenia ([place]) 11/26/2014    Priority: High  . CLL (chronic lymphocytic leukemia) ([place]) 11/27/2013    Priority: High  . CKD (chronic kidney disease), stage III ([place]) 02/09/2018    Priority: Medium  . Hyperglycemia 05/08/2013    Priority: Medium  . Essential hypertension, benign 11/07/2012    Priority: Medium  . Rash 02/09/2018    Priority: Low  . Former smoker 09/30/2015    Priority: Low  . Uterine prolapse 03/29/2015    Priority: Low  . Obesity, unspecified 11/07/2012    Priority: Low  . Seborrheic dermatitis 01/08/2017    Medications- reviewed and updated Current Outpatient Medications  Medication Sig Dispense Refill  . amLODipine (NORVASC) 10 MG tablet Take 1 tablet (10 mg total) by mouth daily. 90 tablet 3  . atenolol (TENORMIN) 100 MG tablet Take 1 tablet (100 mg total) by mouth daily. 90 tablet 3  . losartan-hydrochlorothiazide (HYZAAR) 100-25 MG tablet Take 1 tablet by mouth daily. 90 tablet 3  . diclofenac sodium (VOLTAREN) 1 % GEL Apply 2 g topically 4 (four) times daily. 100 g 3  . triamcinolone cream (KENALOG) 0.1 % Apply 1 application topically 2 (two) times daily. For 7-10 days maximum 80 g 0   No current facility-administered medications for this visit.     Objective: BP 136/68   Pulse 69   Temp 97.8 F (36.6 C) (Oral)   Ht 5\' 2"  (1.575 m)   Wt 176 lb 6.4 oz (80 kg)   SpO2 97%   BMI 32.26 kg/m  Gen: NAD, resting comfortably CV: RRR no murmurs rubs or gallops Lungs: Nonlabored, normal respiratory rate Abdomen: soft/nontender/nondistended Ext: no edema Skin: warm, dry Neuro: grossly normal, moves  all extremities  Assessment/Plan:   Essential hypertension, benign S: controlled on amlodipine 10 mg (increased last week), atenolol 100 mg, losartan-HCTZ 100-25 mg  I encouraged her last week to get a arm cuff to use instead of a wrist cuff. It was 137/75 on her wrist cuff before comin in today- she didn't get arm cuff yet. Her cuff last visit was actually pretty close to our reading BP Readings from Last 3 Encounters:  02/09/18 136/68  02/03/18 (!) 154/92  12/28/17 (!) 178/58  A/P: We discussed blood pressure goal of <140/90. Continue current meds. Cutting aleve may help as well  CKD (chronic kidney disease), stage III ([place]) S: I informed patient of diagnosis.  I asked her about any NSAID use.  for right knee-Using aleve daily. Knee brace neoprene helps  A/P:We discussed GFR being below 60 on several occasions in the last few years and diagnosis of CKD stage III.  We discussed avoiding oral NSAIDs-can try Voltaren gel.  Also can trial Tylenol 8-hour-she seems to not like Tylenol for some reason.  Rash S: she occasionally gets in contact with poison ivy or has very itchy bug bite-reports at times can get severe local reactions. Asks for refill of triamcinolone- last a few years ago. No active spots today- [place] and wants to have on hand as will be doing A lot of outdoor activities A/P: I am willing to refill  this today-sent in   Future Appointments  Date Time Provider [place]  04/07/2018  9:30 AM [name], MD [facility]  04/07/2018 10:00 AM Williemae Area, RN [facility]  04/26/2018  8:45 AM [facility] None  04/26/2018  9:15 AM Heath Lark, MD [facility] None   Already has July follow-up planned-we will keep this appointment  Meds ordered this encounter  Medications  . triamcinolone cream (KENALOG) 0.1 %    Sig: Apply 1 application topically 2 (two) times daily. For 7-10 days maximum    Dispense:  80 g    Refill:  0  .  diclofenac sodium (VOLTAREN) 1 % GEL    Sig: Apply 2 g topically 4 (four) times daily.    Dispense:  100 g    Refill:  3    Return precautions advised.  [name], MD

## 2018-02-09 NOTE — Patient Instructions (Signed)
Blood pressure looks much better- continue current medicines- now I feel better about you leaving the country  Stop the aleve if possible. Try tylenol 8 hour instead- although this is not quite as good for arthritis.   im also going to send in a topical aleve basically- try this if not too expensive

## 2018-02-09 NOTE — Assessment & Plan Note (Signed)
S: she occasionally gets in contact with poison ivy or has very itchy bug bite-reports at times can get severe local reactions. Asks for refill of triamcinolone- last a few years ago. No active spots today- traveling [place] and wants to have on hand as will be doing A lot of outdoor activities A/P: I am willing to refill this today-sent in

## 2018-02-11 ENCOUNTER — Telehealth: Payer: Self-pay | Admitting: Hematology and Oncology

## 2018-02-11 NOTE — Telephone Encounter (Signed)
Patient called to reschedule  °

## 2018-04-06 NOTE — Progress Notes (Addendum)
Subjective:   Maria Lowe is a 84 y.o. female who presents for Medicare Annual (Subsequent) preventive examination.  Reports health as Seeing Dr. Yong Channel at 9:30 CLL 2015   Diet She cooks 2 meals a day dtr states she will occasionally fix a frozen meal  Exercise States [place] and [place] there most days unless it is very hot. Still keeps her apt up  One floor   Tobacco 1980 4 pack years  There are no preventive care reminders to display for this patient.  Dexa postponed until 2045 TD taken today due to right arm laceration  Mammogram declines but does self breast exam  Declines shingles vaccine as she has never has shingrix  She does need new glasses and the dtr will schedule an apt    Cardiac Risk Factors include: advanced age (>34men, >43 women);hypertension;obesity (BMI >30kg/m2)     Objective:     Vitals: BP (!) 144/68   Ht 5\' 2"  (1.575 m)   Wt 176 lb (79.8 kg)   BMI 32.19 kg/m   Body mass index is 32.19 kg/m.  Advanced Directives 04/07/2018 11/28/2015 11/26/2014  Does Patient Have a Medical Advance Directive? No No No  Would patient like information on creating a medical advance directive? No - Patient declined No - patient declined information No - patient declined information   Declines but states her dtr understands her wishes  Tobacco Social History   Tobacco Use  Smoking Status Former Smoker  . Packs/day: 0.20  . Years: 20.00  . Pack years: 4.00  . Types: Cigarettes  . Last attempt to quit: 09/21/1978  . Years since quitting: 39.5  Smokeless Tobacco Never Used     Counseling given: Yes   Clinical Intake:    Past Medical History:  Diagnosis Date  . Arthritis    knee  . CLL (chronic lymphocytic leukemia) ([place]) 11/27/2013  . Hypertension   . Ulcer    stomach ulcer when young   Past Surgical History:  Procedure Laterality Date  . APPENDECTOMY    . TONSILLECTOMY AND ADENOIDECTOMY     Family History  Problem Relation  Age of Onset  . Healthy Mother        died age 38 (communist country could not get records)  . Other Father        died young-unclear cause   Social History   Socioeconomic History  . Marital status: Married    Spouse name: Not on file  . Number of children: Not on file  . Years of education: Not on file  . Highest education level: Not on file  Occupational History  . Not on file  Social Needs  . Financial resource strain: Not on file  . Food insecurity:    Worry: Not on file    Inability: Not on file  . Transportation needs:    Medical: Not on file    Non-medical: Not on file  Tobacco Use  . Smoking status: Former Smoker    Packs/day: 0.20    Years: 20.00    Pack years: 4.00    Types: Cigarettes    Last attempt to quit: 09/21/1978    Years since quitting: 39.5  . Smokeless tobacco: Never Used  Substance and Sexual Activity  . Alcohol use: Yes    Alcohol/week: 4.2 oz    Types: 7 Standard drinks or equivalent per week  . Drug use: No  . Sexual activity: Not on file  Lifestyle  .  Physical activity:    Days per week: Not on file    Minutes per session: Not on file  . Stress: Not on file  Relationships  . Social connections:    Talks on phone: Not on file    Gets together: Not on file    Attends religious service: Not on file    Active member of club or organization: Not on file    Attends meetings of clubs or organizations: Not on file    Relationship status: Not on file  Other Topics Concern  . Not on file  Social History Narrative   Widowed April 2016. 3 children. 7 grandkids. 3 greatgrandkids.   Husband worked as Chief Executive Officer.       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 amLODipine (NORVASC) 10 MG tablet Take 1 tablet (10 mg total) by mouth daily.  Marland Kitchen atenolol (TENORMIN) 100 MG tablet Take 1  tablet (100 mg total) by mouth daily.  . diclofenac sodium (VOLTAREN) 1 % GEL Apply 2 g topically 4 (four) times daily.  Marland Kitchen losartan-hydrochlorothiazide (HYZAAR) 100-25 MG tablet Take 1 tablet by mouth daily.  Marland Kitchen triamcinolone cream (KENALOG) 0.1 % Apply 1 application topically 2 (two) times daily. For 7-10 days maximum   No facility-administered encounter medications on file as of 04/07/2018.     Activities of Daily Living In your present state of health, do you have any difficulty performing the following activities: 04/07/2018  Hearing? N  Vision? N  Walking or climbing stairs? N  Dressing or bathing? N  Doing errands, shopping? N  Preparing Food and eating ? N  Using the Toilet? N  In the past six months, have you accidently leaked urine? N  Do you have problems with loss of bowel control? N  Managing your Medications? N  Managing your Finances? N  Housekeeping or managing your Housekeeping? N  Some recent data might be hidden    Patient Care Team: [name], MD as PCP - General (Family Medicine) Heath Lark, MD as Consulting Physician (Hematology and Oncology)    Assessment:   This is a routine wellness examination for Maria Lowe.  Exercise Activities and Dietary recommendations Current Exercise Habits: Home exercise routine, Type of exercise: walking, Time (Minutes): 60, Frequency (Times/Week): 4, Weekly Exercise (Minutes/Week): 240, Intensity: Mild  Goals    . Patient Stated     To continue to stay independent  States she works puzzles        Fall Risk Fall Risk  04/07/2018 02/09/2018 06/25/2016 10/17/2014  Falls in the past year? No No No No     Depression Screen PHQ 2/9 Scores 04/07/2018 02/09/2018 06/25/2016 10/17/2014  PHQ - 2 Score 0 0 0 0     Cognitive Function MMSE - Mini Mental State Exam 04/07/2018  Not completed: (No Data)    Ad8 score reviewed for issues:  Issues making decisions:  Less interest in hobbies / activities:  Repeats questions,  stories (family complaining):  Trouble using ordinary gadgets (microwave, computer, phone):  Forgets the month or year:   Mismanaging finances:   Remembering appts:  Daily problems with thinking and/or memory: Ad8 score is=0 dtr states she is very independent. Maria Lowe states her dtr has more memory issues than she Still speaks her native language as well as [place] Loves to  work Museum/gallery exhibitions officer History  Administered Date(s) Administered  . Influenza Whole 06/30/2013  . Influenza, High Dose Seasonal PF 06/25/2016  . Influenza,inj,Quad PF,6+ Mos 06/13/2014  . Influenza-Unspecified 07/12/2015, 06/09/2017  . Pneumococcal Conjugate-13 06/25/2016  . Pneumococcal Polysaccharide-23 06/21/2013  . Td 04/07/2018      Screening Tests Health Maintenance  Topic Date Due  . DEXA SCAN  10/23/2043 (Originally 06/21/1998)  . TETANUS/TDAP  09/17/2098 (Originally 06/21/1950)  . INFLUENZA VACCINE  04/21/2018  . PNA vac Low Risk Adult  Completed       Plan:      PCP Notes   Health Maintenance Dexa postponed until 2045 TD taken today due to right arm laceration  Mammogram declines but does self breast exam  Declines shingles vaccine as she has never has shingrix /Corrected 04/14/18 the patient declined the vaccine because she has not had chickenpox. She was educated that she may have had a light case and was unaware but she can still take the shingrix, which is not a live virus.  The patient did voice understanding at the assessment. Wynetta Fines RN   She does need new glasses and the dtr will schedule an apt   Keeps her home and walks.  Very active; get up and go is wnl  Agreed to take her TD today due to right arm laceration  Abnormal Screens  None;   Referrals  None  Patient concerns; none  Nurse Concerns; As noted  Next PCP apt Was seen today      I have personally reviewed and noted the following in the patient's chart:    . Medical and social history . Use of alcohol, tobacco or illicit drugs  . Current medications and supplements . Functional ability and status . Nutritional status . Physical activity . Advanced directives . List of other physicians . Hospitalizations, surgeries, and ER visits in previous 12 months . Vitals . Screenings to include cognitive, depression, and falls . Referrals and appointments  In addition, I have reviewed and discussed with patient certain preventive protocols, quality metrics, and best practice recommendations. A written personalized care plan for preventive services as well as general preventive health recommendations were provided to patient.     Wynetta Fines, RN  04/07/2018

## 2018-04-07 ENCOUNTER — Telehealth: Payer: Self-pay

## 2018-04-07 ENCOUNTER — Ambulatory Visit (INDEPENDENT_AMBULATORY_CARE_PROVIDER_SITE_OTHER): Payer: PPO | Admitting: *Deleted

## 2018-04-07 ENCOUNTER — Encounter: Payer: Self-pay | Admitting: Family Medicine

## 2018-04-07 ENCOUNTER — Ambulatory Visit (INDEPENDENT_AMBULATORY_CARE_PROVIDER_SITE_OTHER): Payer: PPO | Admitting: Family Medicine

## 2018-04-07 VITALS — BP 144/68 | HR 65 | Temp 97.9°F | Ht 62.0 in | Wt 176.8 lb

## 2018-04-07 VITALS — BP 144/68 | Ht 62.0 in | Wt 176.0 lb

## 2018-04-07 DIAGNOSIS — I10 Essential (primary) hypertension: Secondary | ICD-10-CM

## 2018-04-07 DIAGNOSIS — S41111A Laceration without foreign body of right upper arm, initial encounter: Secondary | ICD-10-CM

## 2018-04-07 DIAGNOSIS — D696 Thrombocytopenia, unspecified: Secondary | ICD-10-CM | POA: Diagnosis not present

## 2018-04-07 DIAGNOSIS — Z23 Encounter for immunization: Secondary | ICD-10-CM | POA: Diagnosis not present

## 2018-04-07 DIAGNOSIS — N183 Chronic kidney disease, stage 3 unspecified: Secondary | ICD-10-CM

## 2018-04-07 DIAGNOSIS — Z0000 Encounter for general adult medical examination without abnormal findings: Secondary | ICD-10-CM | POA: Diagnosis not present

## 2018-04-07 DIAGNOSIS — E785 Hyperlipidemia, unspecified: Secondary | ICD-10-CM | POA: Insufficient documentation

## 2018-04-07 DIAGNOSIS — R739 Hyperglycemia, unspecified: Secondary | ICD-10-CM | POA: Diagnosis not present

## 2018-04-07 DIAGNOSIS — C9190 Lymphoid leukemia, unspecified not having achieved remission: Secondary | ICD-10-CM

## 2018-04-07 DIAGNOSIS — Z0001 Encounter for general adult medical examination with abnormal findings: Secondary | ICD-10-CM | POA: Diagnosis not present

## 2018-04-07 DIAGNOSIS — C9110 Chronic lymphocytic leukemia of B-cell type not having achieved remission: Secondary | ICD-10-CM

## 2018-04-07 LAB — CBC WITH DIFFERENTIAL/PLATELET
Basophils Absolute: 0.1 10*3/uL (ref 0.0–0.1)
Basophils Relative: 0.3 % (ref 0.0–3.0)
EOS PCT: 0.6 % (ref 0.0–5.0)
Eosinophils Absolute: 0.1 10*3/uL (ref 0.0–0.7)
HCT: 43.9 % (ref 36.0–46.0)
Hemoglobin: 14.6 g/dL (ref 12.0–15.0)
LYMPHS ABS: 16.6 10*3/uL — AB (ref 0.7–4.0)
Lymphocytes Relative: 74.9 % — ABNORMAL HIGH (ref 12.0–46.0)
MCHC: 33.3 g/dL (ref 30.0–36.0)
MCV: 89.7 fl (ref 78.0–100.0)
MONOS PCT: 2.7 % — AB (ref 3.0–12.0)
Monocytes Absolute: 0.6 10*3/uL (ref 0.1–1.0)
NEUTROS ABS: 4.8 10*3/uL (ref 1.4–7.7)
NEUTROS PCT: 21.5 % — AB (ref 43.0–77.0)
PLATELETS: 110 10*3/uL — AB (ref 150.0–400.0)
RBC: 4.89 Mil/uL (ref 3.87–5.11)
RDW: 15.6 % — ABNORMAL HIGH (ref 11.5–15.5)
WBC: 22.2 10*3/uL (ref 4.0–10.5)

## 2018-04-07 LAB — COMPREHENSIVE METABOLIC PANEL
ALBUMIN: 4.7 g/dL (ref 3.5–5.2)
ALK PHOS: 68 U/L (ref 39–117)
ALT: 13 U/L (ref 0–35)
AST: 17 U/L (ref 0–37)
BUN: 27 mg/dL — AB (ref 6–23)
CALCIUM: 10.1 mg/dL (ref 8.4–10.5)
CO2: 30 mEq/L (ref 19–32)
Chloride: 101 mEq/L (ref 96–112)
Creatinine, Ser: 0.97 mg/dL (ref 0.40–1.20)
GFR: 58.04 mL/min — AB (ref >60.00)
Glucose, Bld: 128 mg/dL — ABNORMAL HIGH (ref 70–99)
Potassium: 4.6 mEq/L (ref 3.5–5.1)
Sodium: 141 mEq/L (ref 135–145)
TOTAL PROTEIN: 6.7 g/dL (ref 6.0–8.3)
Total Bilirubin: 0.9 mg/dL (ref 0.2–1.2)

## 2018-04-07 LAB — LIPID PANEL
CHOLESTEROL: 220 mg/dL — AB (ref 0–200)
HDL: 75.8 mg/dL (ref >39.00)
LDL Cholesterol: 117 mg/dL — ABNORMAL HIGH (ref 0–99)
NonHDL: 143.7
Total CHOL/HDL Ratio: 3
Triglycerides: 133 mg/dL (ref 0.0–149.0)
VLDL: 26.6 mg/dL (ref 0.0–40.0)

## 2018-04-07 LAB — HEMOGLOBIN A1C: HEMOGLOBIN A1C: 5.9 % (ref 4.6–6.5)

## 2018-04-07 NOTE — Patient Instructions (Signed)
Maria Lowe , Thank you for taking time to come for your Medicare Wellness Visit. I appreciate your ongoing commitment to your health goals. Please review the following plan we discussed and let me know if I can assist you in the future.   Will make an apt for the eye doctor soon  You had your Tetanus today    These are the goals we discussed: to day independent and healthy!  Goals    None      This is a list of the screening recommended for you and due dates:  Health Maintenance  Topic Date Due  . DEXA scan (bone density measurement)  10/23/2043*  . Tetanus Vaccine  09/17/2098*  . Flu Shot  04/21/2018  . Pneumonia vaccines  Completed  *Topic was postponed. The date shown is not the original due date.     Health Maintenance for Postmenopausal Women Menopause is a normal process in which your reproductive ability comes to an end. This process happens gradually over a span of months to years, usually between the ages of 45 and 58. Menopause is complete when you have missed 12 consecutive menstrual periods. It is important to talk with your health care provider about some of the most common conditions that affect postmenopausal women, such as heart disease, cancer, and bone loss (osteoporosis). Adopting a healthy lifestyle and getting preventive care can help to promote your health and wellness. Those actions can also lower your chances of developing some of these common conditions. What should I know about menopause? During menopause, you may experience a number of symptoms, such as:  Moderate-to-severe hot flashes.  Night sweats.  Decrease in sex drive.  Mood swings.  Headaches.  Tiredness.  Irritability.  Memory problems.  Insomnia.  Choosing to treat or not to treat menopausal changes is an individual decision that you make with your health care provider. What should I know about hormone replacement therapy and supplements? Hormone therapy products are effective  for treating symptoms that are associated with menopause, such as hot flashes and night sweats. Hormone replacement carries certain risks, especially as you become older. If you are thinking about using estrogen or estrogen with progestin treatments, discuss the benefits and risks with your health care provider. What should I know about heart disease and stroke? Heart disease, heart attack, and stroke become more likely as you age. This may be due, in part, to the hormonal changes that your body experiences during menopause. These can affect how your body processes dietary fats, triglycerides, and cholesterol. Heart attack and stroke are both medical emergencies. There are many things that you can do to help prevent heart disease and stroke:  Have your blood pressure checked at least every 1-2 years. High blood pressure causes heart disease and increases the risk of stroke.  If you are 34-49 years old, ask your health care provider if you should take aspirin to prevent a heart attack or a stroke.  Do not use any tobacco products, including cigarettes, chewing tobacco, or electronic cigarettes. If you need help quitting, ask your health care provider.  It is important to eat a healthy diet and maintain a healthy weight. ? Be sure to include plenty of vegetables, fruits, low-fat dairy products, and lean protein. ? Avoid eating foods that are high in solid fats, added sugars, or salt (sodium).  Get regular exercise. This is one of the most important things that you can do for your health. ? Try to exercise for at least  150 minutes each week. The type of exercise that you do should increase your heart rate and make you sweat. This is known as moderate-intensity exercise. ? Try to do strengthening exercises at least twice each week. Do these in addition to the moderate-intensity exercise.  Know your numbers.Ask your health care provider to check your cholesterol and your blood glucose. Continue to  have your blood tested as directed by your health care provider.  What should I know about cancer screening? There are several types of cancer. Take the following steps to reduce your risk and to catch any cancer development as early as possible. Breast Cancer  Practice breast self-awareness. ? This means understanding how your breasts normally appear and feel. ? It also means doing regular breast self-exams. Let your health care provider know about any changes, no matter how small.  If you are 39 or older, have a clinician do a breast exam (clinical breast exam or CBE) every year. Depending on your age, family history, and medical history, it may be recommended that you also have a yearly breast X-ray (mammogram).  If you have a family history of breast cancer, talk with your health care provider about genetic screening.  If you are at high risk for breast cancer, talk with your health care provider about having an MRI and a mammogram every year.  Breast cancer (BRCA) gene test is recommended for women who have family members with BRCA-related cancers. Results of the assessment will determine the need for genetic counseling and BRCA1 and for BRCA2 testing. BRCA-related cancers include these types: ? Breast. This occurs in males or females. ? Ovarian. ? Tubal. This may also be called fallopian tube cancer. ? Cancer of the abdominal or pelvic lining (peritoneal cancer). ? Prostate. ? Pancreatic.  Cervical, Uterine, and Ovarian Cancer Your health care provider may recommend that you be screened regularly for cancer of the pelvic organs. These include your ovaries, uterus, and vagina. This screening involves a pelvic exam, which includes checking for microscopic changes to the surface of your cervix (Pap test).  For women ages 21-65, health care providers may recommend a pelvic exam and a Pap test every three years. For women ages 74-65, they may recommend the Pap test and pelvic exam,  combined with testing for human papilloma virus (HPV), every five years. Some types of HPV increase your risk of cervical cancer. Testing for HPV may also be done on women of any age who have unclear Pap test results.  Other health care providers may not recommend any screening for nonpregnant women who are considered low risk for pelvic cancer and have no symptoms. Ask your health care provider if a screening pelvic exam is right for you.  If you have had past treatment for cervical cancer or a condition that could lead to cancer, you need Pap tests and screening for cancer for at least 20 years after your treatment. If Pap tests have been discontinued for you, your risk factors (such as having a new sexual partner) need to be reassessed to determine if you should start having screenings again. Some women have medical problems that increase the chance of getting cervical cancer. In these cases, your health care provider may recommend that you have screening and Pap tests more often.  If you have a family history of uterine cancer or ovarian cancer, talk with your health care provider about genetic screening.  If you have vaginal bleeding after reaching menopause, tell your health care provider.  There are currently no reliable tests available to screen for ovarian cancer.  Lung Cancer Lung cancer screening is recommended for adults 79-58 years old who are at high risk for lung cancer because of a history of smoking. A yearly low-dose CT scan of the lungs is recommended if you:  Currently smoke.  Have a history of at least 30 pack-years of smoking and you currently smoke or have quit within the past 15 years. A pack-year is smoking an average of one pack of cigarettes per day for one year.  Yearly screening should:  Continue until it has been 15 years since you quit.  Stop if you develop a health problem that would prevent you from having lung cancer treatment.  Colorectal Cancer  This  type of cancer can be detected and can often be prevented.  Routine colorectal cancer screening usually begins at age 45 and continues through age 8.  If you have risk factors for colon cancer, your health care provider may recommend that you be screened at an earlier age.  If you have a family history of colorectal cancer, talk with your health care provider about genetic screening.  Your health care provider may also recommend using home test kits to check for hidden blood in your stool.  A small camera at the end of a tube can be used to examine your colon directly (sigmoidoscopy or colonoscopy). This is done to check for the earliest forms of colorectal cancer.  Direct examination of the colon should be repeated every 5-10 years until age 63. However, if early forms of precancerous polyps or small growths are found or if you have a family history or genetic risk for colorectal cancer, you may need to be screened more often.  Skin Cancer  Check your skin from head to toe regularly.  Monitor any moles. Be sure to tell your health care provider: ? About any new moles or changes in moles, especially if there is a change in a mole's shape or color. ? If you have a mole that is larger than the size of a pencil eraser.  If any of your family members has a history of skin cancer, especially at a young age, talk with your health care provider about genetic screening.  Always use sunscreen. Apply sunscreen liberally and repeatedly throughout the day.  Whenever you are outside, protect yourself by wearing long sleeves, pants, a wide-brimmed hat, and sunglasses.  What should I know about osteoporosis? Osteoporosis is a condition in which bone destruction happens more quickly than new bone creation. After menopause, you may be at an increased risk for osteoporosis. To help prevent osteoporosis or the bone fractures that can happen because of osteoporosis, the following is recommended:  If you  are 23-36 years old, get at least 1,000 mg of calcium and at least 600 mg of vitamin D per day.  If you are older than age 68 but younger than age 13, get at least 1,200 mg of calcium and at least 600 mg of vitamin D per day.  If you are older than age 100, get at least 1,200 mg of calcium and at least 800 mg of vitamin D per day.  Smoking and excessive alcohol intake increase the risk of osteoporosis. Eat foods that are rich in calcium and vitamin D, and do weight-bearing exercises several times each week as directed by your health care provider. What should I know about how menopause affects my mental health? Depression may occur at any  age, but it is more common as you become older. Common symptoms of depression include:  Low or sad mood.  Changes in sleep patterns.  Changes in appetite or eating patterns.  Feeling an overall lack of motivation or enjoyment of activities that you previously enjoyed.  Frequent crying spells.  Talk with your health care provider if you think that you are experiencing depression. What should I know about immunizations? It is important that you get and maintain your immunizations. These include:  Tetanus, diphtheria, and pertussis (Tdap) booster vaccine.  Influenza every year before the flu season begins.  Pneumonia vaccine.  Shingles vaccine.  Your health care provider may also recommend other immunizations. This information is not intended to replace advice given to you by your health care provider. Make sure you discuss any questions you have with your health care provider. Document Released: 10/30/2005 Document Revised: 03/27/2016 Document Reviewed: 06/11/2015 Elsevier Interactive Patient Education  2018 [place] in the Home Falls can cause injuries and can affect people from all age groups. There are many simple things that you can do to make your home safe and to help prevent falls. What can I do on the outside of  my home?  Regularly repair the edges of walkways and driveways and fix any cracks.  Remove high doorway thresholds.  Trim any shrubbery on the main path into your home.  Use bright outdoor lighting.  Clear walkways of debris and clutter, including tools and rocks.  Regularly check that handrails are securely fastened and in good repair. Both sides of any steps should have handrails.  Install guardrails along the edges of any raised decks or porches.  Have leaves, snow, and ice cleared regularly.  Use sand or salt on walkways during winter months.  In the garage, clean up any spills right away, including grease or oil spills. What can I do in the bathroom?  Use night lights.  Install grab bars by the toilet and in the tub and shower. Do not use towel bars as grab bars.  Use non-skid mats or decals on the floor of the tub or shower.  If you need to sit down while you are in the shower, use a plastic, non-slip stool.  Keep the floor dry. Immediately clean up any water that spills on the floor.  Remove soap buildup in the tub or shower on a regular basis.  Attach bath mats securely with double-sided non-slip rug tape.  Remove throw rugs and other tripping hazards from the floor. What can I do in the bedroom?  Use night lights.  Make sure that a bedside light is easy to reach.  Do not use oversized bedding that drapes onto the floor.  Have a firm chair that has side arms to use for getting dressed.  Remove throw rugs and other tripping hazards from the floor. What can I do in the kitchen?  Clean up any spills right away.  Avoid walking on wet floors.  Place frequently used items in easy-to-reach places.  If you need to reach for something above you, use a sturdy step stool that has a grab bar.  Keep electrical cables out of the way.  Do not use floor polish or wax that makes floors slippery. If you have to use wax, make sure that it is non-skid floor  wax.  Remove throw rugs and other tripping hazards from the floor. What can I do in the stairways?  Do not leave any items  on the stairs.  Make sure that there are handrails on both sides of the stairs. Fix handrails that are broken or loose. Make sure that handrails are as long as the stairways.  Check any carpeting to make sure that it is firmly attached to the stairs. Fix any carpet that is loose or worn.  Avoid having throw rugs at the top or bottom of stairways, or secure the rugs with carpet tape to prevent them from moving.  Make sure that you have a light switch at the top of the stairs and the bottom of the stairs. If you do not have them, have them installed. What are some other fall prevention tips?  Wear closed-toe shoes that fit well and support your feet. Wear shoes that have rubber soles or low heels.  When you use a stepladder, make sure that it is completely opened and that the sides are firmly locked. Have someone hold the ladder while you are using it. Do not climb a closed stepladder.  Add color or contrast paint or tape to grab bars and handrails in your home. Place contrasting color strips on the first and last steps.  Use mobility aids as needed, such as canes, walkers, scooters, and crutches.  Turn on lights if it is dark. Replace any light bulbs that burn out.  Set up furniture so that there are clear paths. Keep the furniture in the same spot.  Fix any uneven floor surfaces.  Choose a carpet design that does not hide the edge of steps of a stairway.  Be aware of any and all pets.  Review your medicines with your healthcare provider. Some medicines can cause dizziness or changes in blood pressure, which increase your risk of falling. Talk with your health care provider about other ways that you can decrease your risk of falls. This may include working with a physical therapist or trainer to improve your strength, balance, and endurance. This information is  not intended to replace advice given to you by your health care provider. Make sure you discuss any questions you have with your health care provider. Document Released: 08/28/2002 Document Revised: 02/04/2016 Document Reviewed: 10/12/2014 Elsevier Interactive Patient Education  2018 [employer].  '

## 2018-04-07 NOTE — Assessment & Plan Note (Signed)
CLL- follows with Dr. Alvy Bimler and stable. Stable thrombocytopenia as well. Update cbc

## 2018-04-07 NOTE — Telephone Encounter (Signed)
[facility] called with critical result on patient. WBC of 22.2

## 2018-04-07 NOTE — Progress Notes (Signed)
Phone: [phone]  Subjective:  Patient presents today for their annual physical. Chief complaint-noted.   See problem oriented charting- ROS- full  review of systems was completed and negative except for: some joint pain  The following were reviewed and entered/updated in epic: Past Medical History:  Diagnosis Date  . Arthritis    knee  . CLL (chronic lymphocytic leukemia) ([place]) 11/27/2013  . Hypertension   . Ulcer    stomach ulcer when young   Patient Active Problem List   Diagnosis Date Noted  . Thrombocytopenia ([place]) 11/26/2014    Priority: High  . CLL (chronic lymphocytic leukemia) ([place]) 11/27/2013    Priority: High  . CKD (chronic kidney disease), stage III ([place]) 02/09/2018    Priority: Medium  . Hyperglycemia 05/08/2013    Priority: Medium  . Essential hypertension, benign 11/07/2012    Priority: Medium  . Rash 02/09/2018    Priority: Low  . Seborrheic dermatitis 01/08/2017    Priority: Low  . Former smoker 09/30/2015    Priority: Low  . Uterine prolapse 03/29/2015    Priority: Low  . Obesity, unspecified 11/07/2012    Priority: Low   Past Surgical History:  Procedure Laterality Date  . APPENDECTOMY    . TONSILLECTOMY AND ADENOIDECTOMY      Family History  Problem Relation Age of Onset  . Healthy Mother        died age 104 (communist [place] could not get records)  . Other Father        died young-unclear cause    Medications- reviewed and updated Current Outpatient Medications  Medication Sig Dispense Refill  . amLODipine (NORVASC) 10 MG tablet Take 1 tablet (10 mg total) by mouth daily. 90 tablet 3  . atenolol (TENORMIN) 100 MG tablet Take 1 tablet (100 mg total) by mouth daily. 90 tablet 3  . diclofenac sodium (VOLTAREN) 1 % GEL Apply 2 g topically 4 (four) times daily. 100 g 3  . losartan-hydrochlorothiazide (HYZAAR) 100-25 MG tablet Take 1 tablet by mouth daily. 90 tablet 3  . triamcinolone cream (KENALOG) 0.1 % Apply 1 application topically  2 (two) times daily. For 7-10 days maximum 80 g 0   No current facility-administered medications for this visit.     Allergies-reviewed and updated No Known Allergies  Social History   Social History Narrative   Widowed April 2016. 3 children. 7 grandkids. 3 greatgrandkids.   Husband worked as Chief Executive Officer.       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 Oropharynx normal Neck: no thyromegaly CV: RRR no murmurs rubs or gallops Lungs: CTAB no crackles, wheeze, rhonchi Abdomen: soft/nontender/nondistended/normal bowel sounds. No rebound or guarding.  Ext: 1+ edema, mild edema in feet as well- very mild redness.  Skin: warm, dry, small laceration healing well on right forearm without surrounding erythema Neuro: grossly normal, moves all extremities, PERRLA  Assessment/Plan:  84 y.o. female presenting for annual physical.  Health Maintenance counseling: 1. Anticipatory guidance: Patient counseled regarding regular dental exams -wears dentures, eye exams - looking for eye  doctor, wearing seatbelts.  2. Risk factor reduction:  Advised patient of need for regular exercise and diet rich and fruits and vegetables to reduce risk of heart attack and stroke. Exercise- not exercising, but remains very active in the home. Diet-she is obese- we discussed modest weight loss and improved diet- she thinks being back from [place] will help- her weight is up 3 lbs since 2 months ago. .  Wt Readings from Last 3 Encounters:  04/07/18 176 lb 12.8 oz (80.2 kg)  02/09/18 176 lb 6.4 oz (80 kg)  02/03/18 173 lb 12.8 oz (78.8 kg)  3.  Immunizations/screenings/ancillary studies- declines Td unless cut/scrape. Puncture wound/laceration to right arm occurred at home in last few days Immunization History  Administered Date(s) Administered  . Influenza Whole 06/30/2013  . Influenza, High Dose Seasonal PF 06/25/2016  . Influenza,inj,Quad PF,6+ Mos 06/13/2014  . Influenza-Unspecified 07/12/2015, 06/09/2017  . Pneumococcal Conjugate-13 06/25/2016  . Pneumococcal Polysaccharide-23 06/21/2013  4. Cervical cancer screening-  passed age based screening, never had abnormal 5. Breast cancer screening-  Still does self exams- has not noted any lumps,  passed age based screening. No family history immediate.  6. Colon cancer screening -  passed age based screening. Reports colonoscopy years ago before age 56- reports no polyps 7. Osteoporosis screening at 34- declines bone density, does agree to it if she has a fracture  Status of chronic or acute concerns   Since return from [place] 2 weeks ago-Some bilateral edema in feet- if props legs up for a while goes away. Overall not worsening. No chest pain or shortness of breath.   Right arm laceration from cat's tooth- encouraged patient to be careful. Cat is up to date on immunizations. No signs of spreading infection  Essential hypertension, benign Hypertension- mild poorly controlled amlodipine 10mg , atenolol 100mg  (though HR already at 65 so likely cant titrate up) and losartan hctz 100-25 mg.   Home #s have been 130/70s. Using wrist cuff which has been close to our readings before BP Readings from Last 3 Encounters:  04/07/18 (!) 144/68  02/09/18 136/68  02/03/18 (!) 154/92  Home #s controlled- will not change therapy. She will bring cuff to next visit. Could add hydralazine if needed  CLL (chronic lymphocytic leukemia) ([facility]) CLL- follows with Dr. Alvy Bimler and stable. Stable thrombocytopenia as well. Update cbc   Hyperglycemia Hyperglycemia/insulin resistance- at risk for Dm in  past, update today. Encouraged at least 5 lbs weight loss by follow up Lab Results  Component Value Date   HGBA1C 6.0 06/25/2016    CKD (chronic kidney disease), stage III ([place]) CKD III- dont love hctz with her CKD but need it for BP control at this point. She has done a good job avoiding oral nsaids  Arthritis- has trialed voltaren gel- didn't really help. Has not used aleve thankfully.    Future Appointments  Date Time Provider [place]  05/16/2018 12:00 PM [facility] 1 [facility] None  05/16/2018 12:30 PM Heath Lark, MD [facility] None   No follow-ups on file.  Lab/Order associations: Preventative health care  Arm laceration, right, initial encounter  CKD (chronic kidney disease), stage III ([place]) - Plan: CBC with Differential/Platelet, Comprehensive metabolic panel  Essential hypertension, benign - Plan: CBC with Differential/Platelet, Comprehensive metabolic panel, Lipid panel  Hyperglycemia - Plan: Hemoglobin A1c  CLL (chronic lymphocytic leukemia) ([facility])  Thrombocytopenia ([facility])  Return precautions advised.  [name], MD

## 2018-04-07 NOTE — Patient Instructions (Addendum)
Could try [facility], [facility], [facility]  Td today under right arm laceration. Tell your cat to be nicer to you!   Lets check in 3-6 months from now so we can recheck that blood pressure. Id love to see you trim off 5 lbs by then as well

## 2018-04-07 NOTE — Assessment & Plan Note (Addendum)
CKD III- dont love hctz with her CKD but need it for BP control at this point. She has done a good job avoiding oral nsaids  Arthritis- has trialed voltaren gel- didn't really help. Has not used aleve thankfully.

## 2018-04-07 NOTE — Assessment & Plan Note (Signed)
Hypertension- mild poorly controlled amlodipine 10mg , atenolol 100mg  (though HR already at 65 so likely cant titrate up) and losartan hctz 100-25 mg.   Home #s have been 130/70s. Using wrist cuff which has been close to our readings before BP Readings from Last 3 Encounters:  04/07/18 (!) 144/68  02/09/18 136/68  02/03/18 (!) 154/92  Home #s controlled- will not change therapy. She will bring cuff to next visit. Could add hydralazine if needed

## 2018-04-07 NOTE — Telephone Encounter (Signed)
Noted- known CLL

## 2018-04-07 NOTE — Assessment & Plan Note (Signed)
Hyperglycemia/insulin resistance- at risk for Dm in past, update today. Encouraged at least 5 lbs weight loss by follow up Lab Results  Component Value Date   HGBA1C 6.0 06/25/2016

## 2018-04-07 NOTE — Progress Notes (Signed)
I have reviewed and agree with note, evaluation, plan.   Maria Lowe- please correct statement that "declines shingles vaccine as she has never has shingrix". LIkely should read as she has never had chicken pox. She can still get vaccine if she wants in future In case she had a small case and was unaware- just as an FYI but I understand her reasoning  [name], MD

## 2018-04-26 ENCOUNTER — Ambulatory Visit: Payer: PPO | Admitting: Hematology and Oncology

## 2018-04-26 ENCOUNTER — Other Ambulatory Visit: Payer: PPO

## 2018-05-16 ENCOUNTER — Other Ambulatory Visit: Payer: Self-pay | Admitting: *Deleted

## 2018-05-16 ENCOUNTER — Telehealth: Payer: Self-pay | Admitting: Hematology and Oncology

## 2018-05-16 ENCOUNTER — Inpatient Hospital Stay (HOSPITAL_BASED_OUTPATIENT_CLINIC_OR_DEPARTMENT_OTHER): Payer: PPO | Admitting: Hematology and Oncology

## 2018-05-16 ENCOUNTER — Inpatient Hospital Stay: Payer: PPO | Attending: Hematology and Oncology

## 2018-05-16 DIAGNOSIS — D696 Thrombocytopenia, unspecified: Secondary | ICD-10-CM | POA: Diagnosis not present

## 2018-05-16 DIAGNOSIS — I10 Essential (primary) hypertension: Secondary | ICD-10-CM

## 2018-05-16 DIAGNOSIS — C9110 Chronic lymphocytic leukemia of B-cell type not having achieved remission: Secondary | ICD-10-CM

## 2018-05-16 DIAGNOSIS — C9190 Lymphoid leukemia, unspecified not having achieved remission: Secondary | ICD-10-CM

## 2018-05-16 DIAGNOSIS — Z79899 Other long term (current) drug therapy: Secondary | ICD-10-CM | POA: Diagnosis not present

## 2018-05-16 LAB — CBC WITH DIFFERENTIAL (CANCER CENTER ONLY)
BASOS ABS: 0 10*3/uL (ref 0.0–0.1)
Basophils Relative: 0 %
Eosinophils Absolute: 0.1 10*3/uL (ref 0.0–0.5)
Eosinophils Relative: 0 %
HEMATOCRIT: 42.4 % (ref 34.8–46.6)
Hemoglobin: 13.8 g/dL (ref 11.6–15.9)
LYMPHS PCT: 72 %
Lymphs Abs: 14.8 10*3/uL — ABNORMAL HIGH (ref 0.9–3.3)
MCH: 29.1 pg (ref 25.1–34.0)
MCHC: 32.5 g/dL (ref 31.5–36.0)
MCV: 89.3 fL (ref 79.5–101.0)
Monocytes Absolute: 0.5 10*3/uL (ref 0.1–0.9)
Monocytes Relative: 3 %
NEUTROS ABS: 5.1 10*3/uL (ref 1.5–6.5)
NEUTROS PCT: 25 %
Platelet Count: 106 10*3/uL — ABNORMAL LOW (ref 145–400)
RBC: 4.74 MIL/uL (ref 3.70–5.45)
RDW: 14.5 % (ref 11.2–14.5)
WBC: 20.6 10*3/uL — AB (ref 3.9–10.3)

## 2018-05-16 NOTE — Telephone Encounter (Signed)
Per 8/26 los.  Gave patient avs and calendar.

## 2018-05-17 ENCOUNTER — Encounter: Payer: Self-pay | Admitting: Hematology and Oncology

## 2018-05-17 NOTE — Assessment & Plan Note (Signed)
She has whitecoat hypertension.  Her blood pressure measured in her primary care doctor's office recently was satisfactory.  She will continue same blood pressure medication without dose adjustment

## 2018-05-17 NOTE — Progress Notes (Signed)
[facility] OFFICE PROGRESS NOTE  Patient Care Team: [name], MD as PCP - General (Family Medicine) Heath Lark, MD as Consulting Physician (Hematology and Oncology)  ASSESSMENT & PLAN:  CLL (chronic lymphocytic leukemia) ([place]) Clinically, she has no signs of disease progression except for thrombocytopenia.  She is not symptomatic The patient is educated to watch out for signs and symptoms of progression I plan to see her back in 12 months for further blood count monitoring We discussed the importance of annual influenza vaccination  Essential hypertension, benign She has whitecoat hypertension.  Her blood pressure measured in her primary care doctor's office recently was satisfactory.  She will continue same blood pressure medication without dose adjustment  Thrombocytopenia ([place]) This is related to CLL but she is not symptomatic.  Even though the platelet count is slightly under 100,000, she is not symptomatic.  She does not need treatment unless it for closer to 50,000 Continue observation only. The patient is educated to watch out for signs and symptoms of bleeding   No orders of the defined types were placed in this encounter.   INTERVAL HISTORY: Please see below for problem oriented charting. She returns for further follow-up No new lymphadenopathy Denies any abnormal weight loss or night sweats No recent infection, fever or chills The patient denies any recent signs or symptoms of bleeding such as spontaneous epistaxis, hematuria or hematochezia.   SUMMARY OF ONCOLOGIC HISTORY:  This patient was diagnosed with CLL around 2009 from abnormal leukocytosis and mild thrombocytopenia. She is asymptomatic. She is being observed. FISH analysis in 2015 showed deletion 13 q. She was recommended observation only  REVIEW OF SYSTEMS:   Constitutional: Denies fevers, chills or abnormal weight loss Eyes: Denies blurriness of vision Ears, nose, mouth,  throat, and face: Denies mucositis or sore throat Respiratory: Denies cough, dyspnea or wheezes Cardiovascular: Denies palpitation, chest discomfort or lower extremity swelling Gastrointestinal:  Denies nausea, heartburn or change in bowel habits Skin: Denies abnormal skin rashes Lymphatics: Denies new lymphadenopathy or easy bruising Neurological:Denies numbness, tingling or new weaknesses Behavioral/Psych: Mood is stable, no new changes  All other systems were reviewed with the patient and are negative.  I have reviewed the past medical history, past surgical history, social history and family history with the patient and they are unchanged from previous note.  ALLERGIES:  has No Known Allergies.  MEDICATIONS:  Current Outpatient Medications  Medication Sig Dispense Refill  . amLODipine (NORVASC) 10 MG tablet Take 1 tablet (10 mg total) by mouth daily. 90 tablet 3  . atenolol (TENORMIN) 100 MG tablet Take 1 tablet (100 mg total) by mouth daily. 90 tablet 3  . diclofenac sodium (VOLTAREN) 1 % GEL Apply 2 g topically 4 (four) times daily. 100 g 3  . losartan-hydrochlorothiazide (HYZAAR) 100-25 MG tablet Take 1 tablet by mouth daily. 90 tablet 3  . triamcinolone cream (KENALOG) 0.1 % Apply 1 application topically 2 (two) times daily. For 7-10 days maximum 80 g 0   No current facility-administered medications for this visit.     PHYSICAL EXAMINATION: ECOG PERFORMANCE STATUS: 0 - Asymptomatic  Vitals:   05/16/18 1205  BP: (!) 167/55  Pulse: 62  Resp: 18  Temp: 97.7 F (36.5 C)  SpO2: 98%   Filed Weights   05/16/18 1205  Weight: 179 lb 3.2 oz (81.3 kg)    GENERAL:alert, no distress and comfortable SKIN: skin color, texture, turgor are normal, no rashes or significant lesions EYES: normal, Conjunctiva  are pink and non-injected, sclera clear OROPHARYNX:no exudate, no erythema and lips, buccal mucosa, and tongue normal  NECK: supple, thyroid normal size, non-tender, without  nodularity LYMPH:  no palpable lymphadenopathy in the cervical, axillary or inguinal LUNGS: clear to auscultation and percussion with normal breathing effort HEART: regular rate & rhythm and no murmurs and no lower extremity edema ABDOMEN:abdomen soft, non-tender and normal bowel sounds Musculoskeletal:no cyanosis of digits and no clubbing  NEURO: alert & oriented x 3 with fluent speech, no focal motor/sensory deficits  LABORATORY DATA:  I have reviewed the data as listed    Component Value Date/Time   NA 141 04/07/2018 1022   NA 141 11/28/2015 0959   K 4.6 04/07/2018 1022   K 4.3 11/28/2015 0959   CL 101 04/07/2018 1022   CO2 30 04/07/2018 1022   CO2 27 11/28/2015 0959   GLUCOSE 128 (H) 04/07/2018 1022   GLUCOSE 113 11/28/2015 0959   BUN 27 (H) 04/07/2018 1022   BUN 23.6 11/28/2015 0959   CREATININE 0.97 04/07/2018 1022   CREATININE 1.0 11/28/2015 0959   CALCIUM 10.1 04/07/2018 1022   CALCIUM 9.5 11/28/2015 0959   PROT 6.7 04/07/2018 1022   PROT 6.6 11/28/2015 0959   ALBUMIN 4.7 04/07/2018 1022   ALBUMIN 4.2 11/28/2015 0959   AST 17 04/07/2018 1022   AST 17 11/28/2015 0959   ALT 13 04/07/2018 1022   ALT 15 11/28/2015 0959   ALKPHOS 68 04/07/2018 1022   ALKPHOS 72 11/28/2015 0959   BILITOT 0.9 04/07/2018 1022   BILITOT 0.64 11/28/2015 0959   GFRNONAA 49 (L) 12/28/2017 0829   GFRAA 56 (L) 12/28/2017 0829    No results found for: SPEP, UPEP  Lab Results  Component Value Date   WBC 20.6 (H) 05/16/2018   NEUTROABS 5.1 05/16/2018   HGB 13.8 05/16/2018   HCT 42.4 05/16/2018   MCV 89.3 05/16/2018   PLT 106 (L) 05/16/2018      Chemistry      Component Value Date/Time   NA 141 04/07/2018 1022   NA 141 11/28/2015 0959   K 4.6 04/07/2018 1022   K 4.3 11/28/2015 0959   CL 101 04/07/2018 1022   CO2 30 04/07/2018 1022   CO2 27 11/28/2015 0959   BUN 27 (H) 04/07/2018 1022   BUN 23.6 11/28/2015 0959   CREATININE 0.97 04/07/2018 1022   CREATININE 1.0 11/28/2015 0959       Component Value Date/Time   CALCIUM 10.1 04/07/2018 1022   CALCIUM 9.5 11/28/2015 0959   ALKPHOS 68 04/07/2018 1022   ALKPHOS 72 11/28/2015 0959   AST 17 04/07/2018 1022   AST 17 11/28/2015 0959   ALT 13 04/07/2018 1022   ALT 15 11/28/2015 0959   BILITOT 0.9 04/07/2018 1022   BILITOT 0.64 11/28/2015 0959       All questions were answered. The patient knows to call the clinic with any problems, questions or concerns. No barriers to learning was detected.  I spent 10 minutes counseling the patient face to face. The total time spent in the appointment was 15 minutes and more than 50% was on counseling and review of test results  Heath Lark, MD 05/17/2018 3:41 PM

## 2018-05-17 NOTE — Assessment & Plan Note (Addendum)
Clinically, she has no signs of disease progression except for thrombocytopenia.  She is not symptomatic The patient is educated to watch out for signs and symptoms of progression I plan to see her back in 12 months for further blood count monitoring We discussed the importance of annual influenza vaccination 

## 2018-05-17 NOTE — Assessment & Plan Note (Signed)
This is related to CLL but she is not symptomatic.  Even though the platelet count is slightly under 100,000, she is not symptomatic.  She does not need treatment unless it for closer to 50,000 Continue observation only. The patient is educated to watch out for signs and symptoms of bleeding

## 2018-05-24 ENCOUNTER — Ambulatory Visit: Payer: Self-pay

## 2018-05-24 NOTE — Telephone Encounter (Signed)
Patient called in with c/o "dizziness." She says "I woke up this morning with my head making a swooshing sound and as the day went on I was dizzy. The room is not spinning. When I close my eyes, I'm not dizzy. I am able to walk normal. My blood pressure was 133/87." I asked about other symptoms, she denies. According to protocol, see PCP within 24 hours, appointment scheduled for tomorrow at 1130 with Dr. Yong Channel, care advice given, patient verbalized understanding.   Reason for Disposition . [1] MODERATE dizziness (e.g., interferes with normal activities) AND [2] has NOT been evaluated by physician for this  (Exception: dizziness caused by heat exposure, sudden standing, or poor fluid intake)  Answer Assessment - Initial Assessment Questions 1. DESCRIPTION: "Describe your dizziness."     Dizzy, head making swooshing sound 2. LIGHTHEADED: "Do you feel lightheaded?" (e.g., somewhat faint, woozy, weak upon standing)     Yes 3. VERTIGO: "Do you feel like either you or the room is spinning or tilting?" (i.e. vertigo)     No 4. SEVERITY: "How bad is it?"  "Do you feel like you are going to faint?" "Can you stand and walk?"   - MILD - walking normally   - MODERATE - interferes with normal activities (e.g., work, school)    - SEVERE - unable to stand, requires support to walk, feels like passing out now.      Mild 5. ONSET:  "When did the dizziness begin?"     This morning but got worse as the day went on 6. AGGRAVATING FACTORS: "Does anything make it worse?" (e.g., standing, change in head position)     Changing position of head 7. HEART RATE: "Can you tell me your heart rate?" "How many beats in 15 seconds?"  (Note: not all patients can do this)       No 8. CAUSE: "What do you think is causing the dizziness?"     I don't know 9. RECURRENT SYMPTOM: "Have you had dizziness before?" If so, ask: "When was the last time?" "What happened that time?"     Yes, couldn't figure out what happened last year  in August 10. OTHER SYMPTOMS: "Do you have any other symptoms?" (e.g., fever, chest pain, vomiting, diarrhea, bleeding)      No 11. PREGNANCY: "Is there any chance you are pregnant?" "When was your last menstrual period?"       No  Protocols used: DIZZINESS [facility]

## 2018-05-25 ENCOUNTER — Encounter: Payer: Self-pay | Admitting: Family Medicine

## 2018-05-25 ENCOUNTER — Ambulatory Visit (INDEPENDENT_AMBULATORY_CARE_PROVIDER_SITE_OTHER): Payer: PPO | Admitting: Family Medicine

## 2018-05-25 VITALS — BP 100/52 | HR 69 | Temp 97.7°F | Ht 62.0 in | Wt 179.4 lb

## 2018-05-25 DIAGNOSIS — R42 Dizziness and giddiness: Secondary | ICD-10-CM | POA: Diagnosis not present

## 2018-05-25 NOTE — Patient Instructions (Signed)
Return if recurrence of symptoms   Benign Positional Vertigo Vertigo is the feeling that you or your surroundings are moving when they are not. Benign positional vertigo is the most common form of vertigo. The cause of this condition is not serious (is benign). This condition is triggered by certain movements and positions (is positional). This condition can be dangerous if it occurs while you are doing something that could endanger you or others, such as driving. What are the causes? In many cases, the cause of this condition is not known. It may be caused by a disturbance in an area of the inner ear that helps your brain to sense movement and balance. This disturbance can be caused by a viral infection (labyrinthitis), head injury, or repetitive motion. What increases the risk? This condition is more likely to develop in:  Women.  People who are 82 years of age or older.  What are the signs or symptoms? Symptoms of this condition usually happen when you move your head or your eyes in different directions. Symptoms may start suddenly, and they usually last for less than a minute. Symptoms may include:  Loss of balance and falling.  Feeling like you are spinning or moving.  Feeling like your surroundings are spinning or moving.  Nausea and vomiting.  Blurred vision.  Dizziness.  Involuntary eye movement (nystagmus).  Symptoms can be mild and cause only slight annoyance, or they can be severe and interfere with daily life. Episodes of benign positional vertigo may return (recur) over time, and they may be triggered by certain movements. Symptoms may improve over time. How is this diagnosed? This condition is usually diagnosed by medical history and a physical exam of the head, neck, and ears. You may be referred to a health care provider who specializes in ear, nose, and throat (ENT) problems (otolaryngologist) or a provider who specializes in disorders of the nervous system  (neurologist). You may have additional testing, including:  MRI.  A CT scan.  Eye movement tests. Your health care provider may ask you to change positions quickly while he or she watches you for symptoms of benign positional vertigo, such as nystagmus. Eye movement may be tested with an electronystagmogram (ENG), caloric stimulation, the Dix-Hallpike test, or the roll test.  An electroencephalogram (EEG). This records electrical activity in your brain.  Hearing tests.  How is this treated? Usually, your health care provider will treat this by moving your head in specific positions to adjust your inner ear back to normal. Surgery may be needed in severe cases, but this is rare. In some cases, benign positional vertigo may resolve on its own in 2-4 weeks. Follow these instructions at home: Safety  Move slowly.Avoid sudden body or head movements.  Avoid driving.  Avoid operating heavy machinery.  Avoid doing any tasks that would be dangerous to you or others if a vertigo episode would occur.  If you have trouble walking or keeping your balance, try using a cane for stability. If you feel dizzy or unstable, sit down right away.  Return to your normal activities as told by your health care provider. Ask your health care provider what activities are safe for you. General instructions  Take over-the-counter and prescription medicines only as told by your health care provider.  Avoid certain positions or movements as told by your health care provider.  Drink enough fluid to keep your urine clear or pale yellow.  Keep all follow-up visits as told by your health care provider.  This is important. Contact a health care provider if:  You have a fever.  Your condition gets worse or you develop new symptoms.  Your family or friends notice any behavioral changes.  Your nausea or vomiting gets worse.  You have numbness or a "pins and needles" sensation. Get help right away if:  You  have difficulty speaking or moving.  You are always dizzy.  You faint.  You develop severe headaches.  You have weakness in your legs or arms.  You have changes in your hearing or vision.  You develop a stiff neck.  You develop sensitivity to light. This information is not intended to replace advice given to you by your health care provider. Make sure you discuss any questions you have with your health care provider. Document Released: 06/15/2006 Document Revised: 02/13/2016 Document Reviewed: 12/31/2014 Elsevier Interactive Patient Education  [employer].

## 2018-05-25 NOTE — Progress Notes (Signed)
Subjective:  Maria Lowe is a 84 y.o. year old very pleasant female patient who presents for/with See problem oriented charting ROS- no chest pain or shortness of breath. Did have some neck pain, dizziness. No palpitations or increased edema    Past Medical History-  Patient Active Problem List   Diagnosis Date Noted  . Thrombocytopenia ([place]) 11/26/2014    Priority: High  . CLL (chronic lymphocytic leukemia) ([place]) 11/27/2013    Priority: High  . CKD (chronic kidney disease), stage III ([place]) 02/09/2018    Priority: Medium  . Hyperglycemia 05/08/2013    Priority: Medium  . Essential hypertension, benign 11/07/2012    Priority: Medium  . Rash 02/09/2018    Priority: Low  . Seborrheic dermatitis 01/08/2017    Priority: Low  . Former smoker 09/30/2015    Priority: Low  . Uterine prolapse 03/29/2015    Priority: Low  . Obesity, unspecified 11/07/2012    Priority: Low  . Hyperlipidemia, unspecified 04/07/2018    Medications- reviewed and updated Current Outpatient Medications  Medication Sig Dispense Refill  . amLODipine (NORVASC) 10 MG tablet Take 1 tablet (10 mg total) by mouth daily. 90 tablet 3  . atenolol (TENORMIN) 100 MG tablet Take 1 tablet (100 mg total) by mouth daily. 90 tablet 3  . diclofenac sodium (VOLTAREN) 1 % GEL Apply 2 g topically 4 (four) times daily. 100 g 3  . losartan-hydrochlorothiazide (HYZAAR) 100-25 MG tablet Take 1 tablet by mouth daily. 90 tablet 3  . triamcinolone cream (KENALOG) 0.1 % Apply 1 application topically 2 (two) times daily. For 7-10 days maximum 80 g 0   No current facility-administered medications for this visit.     Objective: BP (!) 100/52 (BP Location: Left Arm, Patient Position: Sitting, Cuff Size: Large)   Pulse 69   Temp 97.7 F (36.5 C) (Oral)   Ht 5\' 2"  (1.575 m)   Wt 179 lb 6.4 oz (81.4 kg)   SpO2 95%   BMI 32.81 kg/m  Gen: NAD, resting comfortably CV: RRR no murmurs rubs or gallops Lungs: CTAB no crackles, wheeze,  rhonchi Abdomen: soft/nontender/nondistended/normal bowel sounds. No rebound or guarding.  Ext: trace edema Skin: warm, dry Neuro: CN II-XII intact, sensation and reflexes normal throughout, 5/5 muscle strength in bilateral upper and lower extremities. Normal finger to nose. Normal rapid alternating movements. No pronator drift. Normal romberg. Normal gait.   EKG: sinus rhythm with rate 74, left axis, normal intervals except QRS elongated at 122 msec, possible LV hypertrophy- , no st or t wave changes. I do not see true Q waves in v1 and v2- looks to be Rs complex- unchanged from prior EKG 2018.    Assessment/Plan:  Dizziness - Plan: EKG 12-Lead S: left ear was making a loud noise after she had a headache. Then she started to "feel dizzy in her eye". She states it felt like something was moving in her head- denies room movement. Sat down in chair closed eyes and everything stopped- when opened eyes back then would recur. . 1st sign started around 1 PM and then called family 3- 4 PM. By 8 PM symptoms resolved. When she woke up this AM she was fine.   She also had some chills with it- felt like she was shaking inside. Hurt into left neck. Checked BP and was 133/91. Denies clear trigger. Symptoms were worse with head movement. Symptoms didn't worsen with position change.  No chest pain or shortness of breath with episode.  No hearing loss today   Vestibular rehab at home did help.  A/P: positive diastolic orthostatics but no symptoms. Dix Hallpike negative but symptoms have resolved. MRI last year with similar symptoms but more prolonged reassuring and had positive Marye Round at that time. We opted to monitor and see each other back if recurrence given reassuring neuro exam and resolution of symptoms. Suspect recurrence of BPPV.   BP controlled but has had higher values in past- considered reducing dose but will maintain for now unless recurrence of symptoms and symptomatic with orthostatic BPs  or movement  Future Appointments  Date Time Provider [place]  07/04/2019  9:30 AM [facility] 6 [facility] None  07/04/2019 10:00 AM Heath Lark, MD [facility] None   Lab/Order associations: Dizziness - Plan: EKG 12-Lead  Return precautions advised.  [name], MD

## 2018-06-02 ENCOUNTER — Other Ambulatory Visit: Payer: Self-pay | Admitting: Family Medicine

## 2018-07-26 ENCOUNTER — Telehealth: Payer: Self-pay | Admitting: Family Medicine

## 2018-07-26 NOTE — Telephone Encounter (Signed)
Copied from [place] [id number]. Topic: Quick Communication - Rx Refill/Question >> Jul 26, 2018 10:38 AM Scherrie Gerlach wrote: Medication: losartan-hydrochlorothiazide (HYZAAR) 100-25 MG tablet Pharmacy calling to advise this med is on manufacturer backorder and they need 2 separate meds called in for the pt. Losartan 100 mg Hydrochlorothiazide 25 mg Pharmacist also states pt is out of medicine [facility], [place] [phone] (Phone) [phone] (Fax)

## 2018-07-27 MED ORDER — HYDROCHLOROTHIAZIDE 25 MG PO TABS: 25.0000 mg | ORAL_TABLET | Freq: Every day | ORAL | 1 refills | Status: DC

## 2018-07-27 MED ORDER — LOSARTAN POTASSIUM 100 MG PO TABS: 100.0000 mg | ORAL_TABLET | Freq: Every day | ORAL | 1 refills | Status: DC

## 2018-07-27 NOTE — Telephone Encounter (Signed)
New Rxs have been sent in, pt is aware.

## 2018-07-27 NOTE — Telephone Encounter (Signed)
Dr. Yong Channel, [place] to send in as 2 separate prescriptions?

## 2018-07-27 NOTE — Telephone Encounter (Signed)
Yes thanks-you may send in losartan 100 mg for daily use as well as hydrochlorothiazide 25 mg for daily use

## 2018-09-21 HISTORY — PX: CATARACT EXTRACTION W/ INTRAOCULAR LENS IMPLANT: SHX1309

## 2019-01-16 ENCOUNTER — Other Ambulatory Visit: Payer: Self-pay | Admitting: Family Medicine

## 2019-01-17 ENCOUNTER — Other Ambulatory Visit: Payer: Self-pay | Admitting: Family Medicine

## 2019-01-17 NOTE — Telephone Encounter (Signed)
Last OV 05/25/18 Last refill 07/27/18 #90/1 Next OV not scheduled

## 2019-01-18 NOTE — Telephone Encounter (Signed)
Spoke to pt and advise her to make a f/u virtual visit for HTN. Pt stated she does not have a smart phone. I advised pt that we could do a telephone visit. Pt stated she will wait for a while. I advised pt that we will refill the Rx for 30 days and she will have to make an appt. For further refills. Pt verbalized understanding.

## 2019-02-23 ENCOUNTER — Other Ambulatory Visit: Payer: Self-pay | Admitting: Family Medicine

## 2019-02-23 NOTE — Telephone Encounter (Signed)
Patient need to schedule an ov for more refills. 

## 2019-02-23 NOTE — Telephone Encounter (Signed)
Left message to return phone call.

## 2019-04-05 ENCOUNTER — Other Ambulatory Visit: Payer: Self-pay | Admitting: Family Medicine

## 2019-04-06 NOTE — Telephone Encounter (Signed)
Called pt and advised that rx has been refilled.

## 2019-04-06 NOTE — Telephone Encounter (Signed)
Patient was checking on the status of the refill.  She stated she only has four tablets left.

## 2019-04-06 NOTE — Telephone Encounter (Signed)
See note

## 2019-05-01 ENCOUNTER — Other Ambulatory Visit: Payer: Self-pay | Admitting: Family Medicine

## 2019-05-18 ENCOUNTER — Ambulatory Visit (INDEPENDENT_AMBULATORY_CARE_PROVIDER_SITE_OTHER): Payer: PPO

## 2019-05-18 ENCOUNTER — Other Ambulatory Visit: Payer: Self-pay

## 2019-05-18 ENCOUNTER — Encounter: Payer: Self-pay | Admitting: Family Medicine

## 2019-05-18 ENCOUNTER — Ambulatory Visit (INDEPENDENT_AMBULATORY_CARE_PROVIDER_SITE_OTHER): Payer: PPO | Admitting: Family Medicine

## 2019-05-18 VITALS — BP 126/84 | HR 71 | Temp 98.0°F | Ht 62.0 in | Wt 174.2 lb

## 2019-05-18 VITALS — BP 126/84 | Ht 62.0 in | Wt 174.2 lb

## 2019-05-18 DIAGNOSIS — R739 Hyperglycemia, unspecified: Secondary | ICD-10-CM | POA: Diagnosis not present

## 2019-05-18 DIAGNOSIS — E785 Hyperlipidemia, unspecified: Secondary | ICD-10-CM

## 2019-05-18 DIAGNOSIS — Z0000 Encounter for general adult medical examination without abnormal findings: Secondary | ICD-10-CM

## 2019-05-18 DIAGNOSIS — D696 Thrombocytopenia, unspecified: Secondary | ICD-10-CM | POA: Diagnosis not present

## 2019-05-18 DIAGNOSIS — C9110 Chronic lymphocytic leukemia of B-cell type not having achieved remission: Secondary | ICD-10-CM | POA: Diagnosis not present

## 2019-05-18 DIAGNOSIS — H538 Other visual disturbances: Secondary | ICD-10-CM

## 2019-05-18 DIAGNOSIS — E669 Obesity, unspecified: Secondary | ICD-10-CM

## 2019-05-18 DIAGNOSIS — I10 Essential (primary) hypertension: Secondary | ICD-10-CM

## 2019-05-18 DIAGNOSIS — N183 Chronic kidney disease, stage 3 unspecified: Secondary | ICD-10-CM

## 2019-05-18 LAB — LIPID PANEL
Cholesterol: 202 mg/dL — ABNORMAL HIGH (ref 0–200)
HDL: 68.5 mg/dL (ref >39.00)
LDL Cholesterol: 105 mg/dL — ABNORMAL HIGH (ref 0–99)
NonHDL: 133.08
Total CHOL/HDL Ratio: 3
Triglycerides: 142 mg/dL (ref 0.0–149.0)
VLDL: 28.4 mg/dL (ref 0.0–40.0)

## 2019-05-18 LAB — COMPREHENSIVE METABOLIC PANEL
ALT: 12 U/L (ref 0–35)
AST: 20 U/L (ref 0–37)
Albumin: 4.6 g/dL (ref 3.5–5.2)
Alkaline Phosphatase: 69 U/L (ref 39–117)
BUN: 29 mg/dL — ABNORMAL HIGH (ref 6–23)
CO2: 29 mEq/L (ref 19–32)
Calcium: 9.5 mg/dL (ref 8.4–10.5)
Chloride: 103 mEq/L (ref 96–112)
Creatinine, Ser: 0.99 mg/dL (ref 0.40–1.20)
GFR: 53.2 mL/min — ABNORMAL LOW (ref >60.00)
Glucose, Bld: 178 mg/dL — ABNORMAL HIGH (ref 70–99)
Potassium: 4.1 mEq/L (ref 3.5–5.1)
Sodium: 142 mEq/L (ref 135–145)
Total Bilirubin: 0.5 mg/dL (ref 0.2–1.2)
Total Protein: 6.4 g/dL (ref 6.0–8.3)

## 2019-05-18 LAB — CBC WITH DIFFERENTIAL/PLATELET
Basophils Absolute: 0.1 10*3/uL (ref 0.0–0.1)
Basophils Relative: 0.3 % (ref 0.0–3.0)
Eosinophils Absolute: 0.1 10*3/uL (ref 0.0–0.7)
Eosinophils Relative: 0.4 % (ref 0.0–5.0)
HCT: 43.9 % (ref 36.0–46.0)
Hemoglobin: 14.3 g/dL (ref 12.0–15.0)
Lymphocytes Relative: 77 % — ABNORMAL HIGH (ref 12.0–46.0)
Lymphs Abs: 18.6 10*3/uL — ABNORMAL HIGH (ref 0.7–4.0)
MCHC: 32.5 g/dL (ref 30.0–36.0)
MCV: 91.6 fl (ref 78.0–100.0)
Monocytes Absolute: 0.6 10*3/uL (ref 0.1–1.0)
Monocytes Relative: 2.4 % — ABNORMAL LOW (ref 3.0–12.0)
Neutro Abs: 4.8 10*3/uL (ref 1.4–7.7)
Neutrophils Relative %: 19.9 % — ABNORMAL LOW (ref 43.0–77.0)
Platelets: 110 10*3/uL — ABNORMAL LOW (ref 150.0–400.0)
RBC: 4.79 Mil/uL (ref 3.87–5.11)
RDW: 15.4 % (ref 11.5–15.5)
WBC: 24.1 10*3/uL (ref 4.0–10.5)

## 2019-05-18 LAB — HEMOGLOBIN A1C: Hgb A1c MFr Bld: 6 % (ref 4.6–6.5)

## 2019-05-18 MED ORDER — LOSARTAN POTASSIUM 100 MG PO TABS: 100.0000 mg | ORAL_TABLET | Freq: Every day | ORAL | 3 refills | Status: DC

## 2019-05-18 MED ORDER — HYDROCHLOROTHIAZIDE 25 MG PO TABS: 25.0000 mg | ORAL_TABLET | Freq: Every day | ORAL | 3 refills | Status: DC

## 2019-05-18 MED ORDER — ATENOLOL 100 MG PO TABS: 100.0000 mg | ORAL_TABLET | Freq: Every day | ORAL | 3 refills | Status: DC

## 2019-05-18 MED ORDER — AMLODIPINE BESYLATE 10 MG PO TABS: 10.0000 mg | ORAL_TABLET | Freq: Every day | ORAL | 3 refills | Status: DC

## 2019-05-18 NOTE — Progress Notes (Signed)
Subjective:   Maria Lowe is a 85 y.o. female who presents for Medicare Annual (Subsequent) preventive examination.  Review of Systems:   Cardiac Risk Factors include: advanced age (>83men, >84 women);dyslipidemia     Objective:     Vitals: BP 126/84   Ht 5\' 2"  (1.575 m)   Wt 174 lb 2.6 oz (79 kg)   BMI 31.85 kg/m   Body mass index is 31.85 kg/m.  Advanced Directives 05/18/2019 04/07/2018 11/28/2015 11/26/2014  Does Patient Have a Medical Advance Directive? Yes No No No  Type of Advance Directive Living will;Healthcare Power of Attorney - - -  Does patient want to make changes to medical advance directive? No - Patient declined - - -  Copy of [place] in Chart? No - copy requested - - -  Would patient like information on creating a medical advance directive? - No - Patient declined No - patient declined information No - patient declined information    Tobacco Social History   Tobacco Use  Smoking Status Former Smoker  . Packs/day: 0.20  . Years: 20.00  . Pack years: 4.00  . Types: Cigarettes  . Quit date: 09/21/1978  . Years since quitting: 40.6  Smokeless Tobacco Never Used       Clinical Intake:  Pre-visit preparation completed: Yes  Diabetes: No  How often do you need to have someone help you when you read instructions, pamphlets, or other written materials from your doctor or pharmacy?: 1 - Never  Interpreter Needed?: No  Information entered by :: Denman George  Past Medical History:  Diagnosis Date  . Arthritis    knee  . CLL (chronic lymphocytic leukemia) ([place]) 11/27/2013  . Hypertension   . Ulcer    stomach ulcer when young   Past Surgical History:  Procedure Laterality Date  . APPENDECTOMY    . TONSILLECTOMY AND ADENOIDECTOMY     Family History  Problem Relation Age of Onset  . Healthy Mother        died age 60 (communist country could not get records)  . Other Father        died young-unclear cause   Social  History   Socioeconomic History  . Marital status: Married    Spouse name: Not on file  . Number of children: Not on file  . Years of education: Not on file  . Highest education level: Not on file  Occupational History  . Not on file  Social Needs  . Financial resource strain: Not on file  . Food insecurity    Worry: Not on file    Inability: Not on file  . Transportation needs    Medical: Not on file    Non-medical: Not on file  Tobacco Use  . Smoking status: Former Smoker    Packs/day: 0.20    Years: 20.00    Pack years: 4.00    Types: Cigarettes    Quit date: 09/21/1978    Years since quitting: 40.6  . Smokeless tobacco: Never Used  Substance and Sexual Activity  . Alcohol use: Yes    Alcohol/week: 7.0 standard drinks    Types: 7 Standard drinks or equivalent per week  . Drug use: No  . Sexual activity: Not on file  Lifestyle  . Physical activity    Days per week: Not on file    Minutes per session: Not on file  . Stress: Not on file  Relationships  . Social connections  Talks on phone: Not on file    Gets together: Not on file    Attends religious service: Not on file    Active member of club or organization: Not on file    Attends meetings of clubs or organizations: Not on file    Relationship status: Not on file  Other Topics Concern  . Not on file  Social History Narrative   Widowed April 2016. 3 children. 7 grandkids. 3 greatgrandkids.   Husband worked as Chief Executive Officer.       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 amLODipine (NORVASC) 10 MG tablet Take 1 tablet (10 mg total) by mouth daily.  Marland Kitchen atenolol (TENORMIN) 100 MG tablet Take 1 tablet (100 mg total) by mouth daily.  . diclofenac sodium (VOLTAREN) 1 % GEL Apply 2 g topically 4 (four) times daily.  . hydrochlorothiazide  (HYDRODIURIL) 25 MG tablet Take 1 tablet (25 mg total) by mouth daily.  Marland Kitchen losartan (COZAAR) 100 MG tablet Take 1 tablet (100 mg total) by mouth daily.  Marland Kitchen triamcinolone cream (KENALOG) 0.1 % Apply 1 application topically 2 (two) times daily. For 7-10 days maximum (Patient not taking: Reported on 05/18/2019)   No facility-administered encounter medications on file as of 05/18/2019.     Activities of Daily Living In your present state of health, do you have any difficulty performing the following activities: 05/18/2019  Hearing? N  Vision? N  Difficulty concentrating or making decisions? N  Walking or climbing stairs? N  Dressing or bathing? N  Doing errands, shopping? N  Comment does not drive; transported by family  Preparing Food and eating ? N  Using the Toilet? N  In the past six months, have you accidently leaked urine? N  Do you have problems with loss of bowel control? N  Managing your Medications? N  Managing your Finances? N  Housekeeping or managing your Housekeeping? N  Some recent data might be hidden    Patient Care Team: [name], MD as PCP - General (Family Medicine) Heath Lark, MD as Consulting Physician (Hematology and Oncology)    Assessment:   This is a routine wellness examination for Maria Lowe.  Exercise Activities and Dietary recommendations Current Exercise Habits: The patient does not participate in regular exercise at present  Goals    . 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 0 0 No No No  Number falls in past yr: 0 0 - - -  Injury with Fall? 0 0 - - -  Follow up Education provided - - - -   Is the patient's home free of loose throw rugs in walkways, pet beds, electrical cords, etc?   Yes       Grab bars in the bathroom? Yes       Handrails on the stairs?   Yes      Adequate lighting?   Yes    Timed Get Up and Go performed: completed and within normal timeframe   Depression Screen PHQ 2/9 Scores 05/18/2019 05/18/2019 04/07/2018 02/09/2018  PHQ - 2 Score 0 0 0 0     Cognitive Function- no cognitive concerns  at this time  MMSE - Mini Mental State Exam 04/07/2018  Not completed: (No Data)     6CIT Screen 05/18/2019  What Year? 0 points  What month? 0 points  What time? 0 points  Count back from 20 0 points  Months in reverse 0 points  Repeat phrase 0 points  Total Score 0    Immunization History  Administered Date(s) Administered  . Influenza Whole 06/30/2013  . Influenza, High Dose Seasonal PF 06/25/2016  . Influenza,inj,Quad PF,6+ Mos 06/13/2014  . Influenza-Unspecified 07/12/2015, 06/09/2017  . Pneumococcal Conjugate-13 06/25/2016  . Pneumococcal Polysaccharide-23 06/21/2013  . Td 04/07/2018    Qualifies for Shingles Vaccine? Discussed and patient will check with pharmacy for coverage.  Patient education handout provided    Screening Tests Health Maintenance  Topic Date Due  . INFLUENZA VACCINE  04/22/2019  . DEXA SCAN  10/23/2043 (Originally 06/21/1998)  . TETANUS/TDAP  04/07/2028  . PNA vac Low Risk Adult  Completed    Cancer Screenings: Lung: Low Dose CT Chest recommended if Age 38-80 years, 30 pack-year currently smoking OR have quit w/in 15years. Patient does not qualify. Breast:  Up to date on Mammogram? Not indicated    Up to date of Bone Density/Dexa? Not indicated  Colorectal: Not indicated         Plan:    I have personally reviewed and addressed the Medicare Annual Wellness questionnaire and have noted the following in the patient's chart:  A. Medical and social history B. Use of alcohol, tobacco or illicit drugs  C. Current medications and supplements D. Functional ability and status E.  Nutritional status F.  Physical activity G. Advance directives H. List of other physicians I.  Hospitalizations, surgeries, and ER visits in previous  12 months J.  [place] such as hearing and vision if needed, cognitive and depression L. Referrals, records requested, and appointments- none   In addition, I have reviewed and discussed with patient certain preventive protocols, quality metrics, and best practice recommendations. A written personalized care plan for preventive services as well as general preventive health recommendations were provided to patient.   Signed,  Denman George, LPN  Nurse Health Advisor   Nurse Notes: no additions

## 2019-05-18 NOTE — Progress Notes (Signed)
I have reviewed and agree with note, evaluation, plan.  Please see my separate note from today- patient seems to be doing very well.  [name], MD

## 2019-05-18 NOTE — Patient Instructions (Addendum)
Health Maintenance Due  Topic Date Due  . INFLUENZA VACCINE - please let us know when you get your flu shot so we can log it in the computer (if you dont get it here) 04/22/2019   We will call you within two weeks about your referral to the eye doctor. If you do not hear within 3 weeks, give [place] a call.   Please stop by lab before you go If you do not have mychart- we will call you about results within 5 business days of [place] receiving them.  If you have mychart- we will send your results within 3 business days of [place] receiving them.  If abnormal or we want to clarify a result, we will call or mychart you to make sure you receive the message.  If you have questions or concerns or don't hear within 5-7 days, please send [place] a message or call us.

## 2019-05-18 NOTE — Patient Instructions (Signed)
[name] , Thank you for taking time to come for your Medicare Wellness Visit. I appreciate your ongoing commitment to your health goals. Please review the following plan we discussed and let me know if I can assist you in the future.   Screening recommendations/referrals: Colorectal Screening: not indicated  Mammogram: not indicated  Bone Density: not indicated   Vision and Dental Exams: Recommended annual ophthalmology exams for early detection of glaucoma and other disorders of the eye Recommended annual dental exams for proper oral hygiene   Vaccinations: Influenza vaccine:  recommended this fall either at PCP office or through your local pharmacy  Pneumococcal vaccine: completed 06/25/16 Tdap vaccine: up to date last 04/07/18   Shingles vaccine: Please call your insurance company to determine your out of pocket expense for the Shingrix vaccine. You may receive this vaccine at your local pharmacy.  Advanced directives: Please bring a copy of your POA (Power of Attorney) and/or Living Will to your next appointment.  Goals: Recommend to drink at least 6-8 8oz glasses of water per day.  Next appointment: Please schedule your Annual Wellness Visit with your Nurse Health Advisor in one year.  Preventive Care 19 Years and Older, Female Preventive care refers to lifestyle choices and visits with your health care provider that can promote health and wellness. What does preventive care include?  A yearly physical exam. This is also called an annual well check.  Dental exams once or twice a year.  Routine eye exams. Ask your health care provider how often you should have your eyes checked.  Personal lifestyle choices, including:  Daily care of your teeth and gums.  Regular physical activity.  Eating a healthy diet.  Avoiding tobacco and drug use.  Limiting alcohol use.  Practicing safe sex.  Taking low-dose aspirin every day if recommended by your health care provider.   Taking vitamin and mineral supplements as recommended by your health care provider. What happens during an annual well check? The services and screenings done by your health care provider during your annual well check will depend on your age, overall health, lifestyle risk factors, and family history of disease. Counseling  Your health care provider may ask you questions about your:  Alcohol use.  Tobacco use.  Drug use.  Emotional well-being.  Home and relationship well-being.  Sexual activity.  Eating habits.  History of falls.  Memory and ability to understand (cognition).  Work and work Statistician.  Reproductive health. Screening  You may have the following tests or measurements:  Height, weight, and BMI.  Blood pressure.  Lipid and cholesterol levels. These may be checked every 5 years, or more frequently if you are over 75 years old.  Skin check.  Lung cancer screening. You may have this screening every year starting at age 75 if you have a 30-pack-year history of smoking and currently smoke or have quit within the past 15 years.  Fecal occult blood test (FOBT) of the stool. You may have this test every year starting at age 35.  Flexible sigmoidoscopy or colonoscopy. You may have a sigmoidoscopy every 5 years or a colonoscopy every 10 years starting at age 30.  Hepatitis C blood test.  Hepatitis B blood test.  Sexually transmitted disease (STD) testing.  Diabetes screening. This is done by checking your blood sugar (glucose) after you have not eaten for a while (fasting). You may have this done every 1-3 years.  Bone density scan. This is done to screen for osteoporosis. You  may have this done starting at age 45.  Mammogram. This may be done every 1-2 years. Talk to your health care provider about how often you should have regular mammograms. Talk with your health care provider about your test results, treatment options, and if necessary, the need for  more tests. Vaccines  Your health care provider may recommend certain vaccines, such as:  Influenza vaccine. This is recommended every year.  Tetanus, diphtheria, and acellular pertussis (Tdap, Td) vaccine. You may need a Td booster every 10 years.  Zoster vaccine. You may need this after age 12.  Pneumococcal 13-valent conjugate (PCV13) vaccine. One dose is recommended after age 86.  Pneumococcal polysaccharide (PPSV23) vaccine. One dose is recommended after age 75. Talk to your health care provider about which screenings and vaccines you need and how often you need them. This information is not intended to replace advice given to you by your health care provider. Make sure you discuss any questions you have with your health care provider. Document Released: 10/04/2015 Document Revised: 05/27/2016 Document Reviewed: 07/09/2015 Elsevier Interactive Patient Education  2017 [place] Prevention in the Home Falls can cause injuries. They can happen to people of all ages. There are many things you can do to make your home safe and to help prevent falls. What can I do on the outside of my home?  Regularly fix the edges of walkways and driveways and fix any cracks.  Remove anything that might make you trip as you walk through a door, such as a raised step or threshold.  Trim any bushes or trees on the path to your home.  Use bright outdoor lighting.  Clear any walking paths of anything that might make someone trip, such as rocks or tools.  Regularly check to see if handrails are loose or broken. Make sure that both sides of any steps have handrails.  Any raised decks and porches should have guardrails on the edges.  Have any leaves, snow, or ice cleared regularly.  Use sand or salt on walking paths during winter.  Clean up any spills in your garage right away. This includes oil or grease spills. What can I do in the bathroom?  Use night lights.  Install grab bars by  the toilet and in the tub and shower. Do not use towel bars as grab bars.  Use non-skid mats or decals in the tub or shower.  If you need to sit down in the shower, use a plastic, non-slip stool.  Keep the floor dry. Clean up any water that spills on the floor as soon as it happens.  Remove soap buildup in the tub or shower regularly.  Attach bath mats securely with double-sided non-slip rug tape.  Do not have throw rugs and other things on the floor that can make you trip. What can I do in the bedroom?  Use night lights.  Make sure that you have a light by your bed that is easy to reach.  Do not use any sheets or blankets that are too big for your bed. They should not hang down onto the floor.  Have a firm chair that has side arms. You can use this for support while you get dressed.  Do not have throw rugs and other things on the floor that can make you trip. What can I do in the kitchen?  Clean up any spills right away.  Avoid walking on wet floors.  Keep items that you use a lot  in easy-to-reach places.  If you need to reach something above you, use a strong step stool that has a grab bar.  Keep electrical cords out of the way.  Do not use floor polish or wax that makes floors slippery. If you must use wax, use non-skid floor wax.  Do not have throw rugs and other things on the floor that can make you trip. What can I do with my stairs?  Do not leave any items on the stairs.  Make sure that there are handrails on both sides of the stairs and use them. Fix handrails that are broken or loose. Make sure that handrails are as long as the stairways.  Check any carpeting to make sure that it is firmly attached to the stairs. Fix any carpet that is loose or worn.  Avoid having throw rugs at the top or bottom of the stairs. If you do have throw rugs, attach them to the floor with carpet tape.  Make sure that you have a light switch at the top of the stairs and the bottom  of the stairs. If you do not have them, ask someone to add them for you. What else can I do to help prevent falls?  Wear shoes that:  Do not have high heels.  Have rubber bottoms.  Are comfortable and fit you well.  Are closed at the toe. Do not wear sandals.  If you use a stepladder:  Make sure that it is fully opened. Do not climb a closed stepladder.  Make sure that both sides of the stepladder are locked into place.  Ask someone to hold it for you, if possible.  Clearly mark and make sure that you can see:  Any grab bars or handrails.  First and last steps.  Where the edge of each step is.  Use tools that help you move around (mobility aids) if they are needed. These include:  Canes.  Walkers.  Scooters.  Crutches.  Turn on the lights when you go into a dark area. Replace any light bulbs as soon as they burn out.  Set up your furniture so you have a clear path. Avoid moving your furniture around.  If any of your floors are uneven, fix them.  If there are any pets around you, be aware of where they are.  Review your medicines with your doctor. Some medicines can make you feel dizzy. This can increase your chance of falling. Ask your doctor what other things that you can do to help prevent falls. This information is not intended to replace advice given to you by your health care provider. Make sure you discuss any questions you have with your health care provider. Document Released: 07/04/2009 Document Revised: 02/13/2016 Document Reviewed: 10/12/2014 Elsevier Interactive Patient Education  2017 [employer].

## 2019-05-18 NOTE — Progress Notes (Signed)
Phone: [phone]   Subjective:  Patient presents today for their annual physical. Chief complaint-noted.   See problem oriented charting- ROS- full  review of systems was completed and negative except for: visual problems, dizziness at times (history of BPPV and vestibular rehab)  The following were reviewed and entered/updated in epic: Past Medical History:  Diagnosis Date  . Arthritis    knee  . CLL (chronic lymphocytic leukemia) ([place]) 11/27/2013  . Hypertension   . Ulcer    stomach ulcer when young   Patient Active Problem List   Diagnosis Date Noted  . Thrombocytopenia ([place]) 11/26/2014    Priority: High  . CLL (chronic lymphocytic leukemia) ([place]) 11/27/2013    Priority: High  . CKD (chronic kidney disease), stage III ([place]) 02/09/2018    Priority: Medium  . Hyperglycemia 05/08/2013    Priority: Medium  . Essential hypertension, benign 11/07/2012    Priority: Medium  . Rash 02/09/2018    Priority: Low  . Seborrheic dermatitis 01/08/2017    Priority: Low  . Former smoker 09/30/2015    Priority: Low  . Uterine prolapse 03/29/2015    Priority: Low  . Obesity, unspecified 11/07/2012    Priority: Low  . Hyperlipidemia, unspecified 04/07/2018   Past Surgical History:  Procedure Laterality Date  . APPENDECTOMY    . TONSILLECTOMY AND ADENOIDECTOMY      Family History  Problem Relation Age of Onset  . Healthy Mother        died age 83 (communist country could not get records)  . Other Father        died young-unclear cause    Medications- reviewed and updated Current Outpatient Medications  Medication Sig Dispense Refill  . amLODipine (NORVASC) 10 MG tablet Take 1 tablet (10 mg total) by mouth daily. 90 tablet 3  . atenolol (TENORMIN) 100 MG tablet Take 1 tablet (100 mg total) by mouth daily. 90 tablet 3  . diclofenac sodium (VOLTAREN) 1 % GEL Apply 2 g topically 4 (four) times daily. 100 g 3  . hydrochlorothiazide (HYDRODIURIL) 25 MG tablet Take 1 tablet (25  mg total) by mouth daily. 90 tablet 3  . losartan (COZAAR) 100 MG tablet Take 1 tablet (100 mg total) by mouth daily. 90 tablet 3  . triamcinolone cream (KENALOG) 0.1 % Apply 1 application topically 2 (two) times daily. For 7-10 days maximum 80 g 0   No current facility-administered medications for this visit.     Allergies-reviewed and updated No Known Allergies  Social History   Social History Narrative   Widowed April 2016. 3 children. 7 grandkids. 3 greatgrandkids.   Husband worked as Chief Executive Officer.       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 Oropharynx normal Neck: no thyromegaly CV: RRR no murmurs rubs or gallops Lungs: CTAB no crackles, wheeze, rhonchi Abdomen: soft/nontender/nondistended/normal bowel sounds. No rebound or guarding.  Ext: no edema Skin: warm, dry Neuro: grossly normal, moves all extremities, PERRLA    Assessment and Plan    85 y.o.  female presenting for annual physical.  Health Maintenance counseling: 1. Anticipatory guidance: Patient counseled regarding regular dental exams - dentures, eye exams - thinks may be getting cataract- would like referral to optho (done today),  avoiding smoking and second hand smoke , limiting alcohol to 1 beverage per day- wine usually .   2. Risk factor reduction:  Advised patient of need for regular exercise and diet rich and fruits and vegetables to reduce risk of heart attack and stroke. Exercise- still walking when heat allows. Diet-reasonably healthy- could work on portion size.  Wt Readings from Last 3 Encounters:  05/18/19 174 lb  3.2 oz (79 kg)  05/25/18 179 lb 6.4 oz (81.4 kg)  05/16/18 179 lb 3.2 oz (81.3 kg)  3. Immunizations/screenings/ancillary studies-wants to do influenza vaccine later in the season.  Discussed Shingrix-she declines for now-she strongly believes she never had chickenpox but I did tell her she still technically could give vaccine  Immunization History  Administered Date(s) Administered  . Influenza Whole 06/30/2013  . Influenza, High Dose Seasonal PF 06/25/2016  . Influenza,inj,Quad PF,6+ Mos 06/13/2014  . Influenza-Unspecified 07/12/2015, 06/09/2017  . Pneumococcal Conjugate-13 06/25/2016  . Pneumococcal Polysaccharide-23 06/21/2013  . Td 04/07/2018  4. Cervical cancer screening- past age based screening- never had a normal exam 5. Breast cancer screening-  breast exam-prefers to do self exams and has no lumps-and mammogram - past age based screening recommendations per [place] PTF. 6. Colon cancer screening - past age based screening recommendations.  Reports never had polyps.  No blood in stool.  No melena. 7. Skin cancer screening- no dermatologist. advised regular sunscreen use. Denies worrisome, changing, or new skin lesions.  8. Birth control/STD check-widowed.  Not sexually active 9. Osteoporosis screening at 74- declines bone density- prefers to avoid anything that may lead to meds if possible -Former smoker but quit in 1980s  Status of chronic or acute concerns  Hypertension - Taking Atenolol 100 mg daily, Amlodipine 10 mg daily, Losartan 100 mg daily, and HCTZ 25 mg daily. Checking BP at home, ranges from 120s/70s to 150s/80s but averaging less than 140/90. Denies HA, dizziness, CP, SOB. Reports visual changes. Salt in moderation.   CKD III -hopefully stable.  Knows to avoid NSAIDs.  Update CMP   Hyperglycemia -elevated A1c in the past-will repeat today.  Weight stable within 3 pounds of last year Lab Results  Component Value Date   HGBA1C 5.9 04/07/2018    Chronic Lymphocytic  Leukemia -follows with Dr. Alvy Bimler and has been stable.  Get CBC with differential today  Thrombocytopenia -related to CLL-continue to monitor.  Suspect stable  Continue Voltaren gel as needed for arthritis- not very helpful   Hyperlipidemia- mild high cholesterol but no history of heart attack or stroke- continue off meds for primary prevention above age 86 Lab Results  Component Value Date   CHOL 220 (H) 04/07/2018   HDL 75.80 04/07/2018   LDLCALC 117 (H) 04/07/2018   TRIG 133.0 04/07/2018   CHOLHDL 3 04/07/2018    Recommended follow up: 89-month follow-up or sooner if needed Future Appointments  Date Time Provider [place]  05/18/2019  1:45 PM Bernita Raisin, LPN [facility]  [id number]  9:30 AM [facility] LAB 6 [facility] None  07/04/2019 10:00 AM Heath Lark, MD [facility] None   Lab/Order associations: NOT fasting    ICD-10-CM   1. Preventative health care  Z00.00   2. Essential hypertension, benign  I10 CBC with Differential/Platelet    Comprehensive metabolic panel  Lipid panel  3. CKD (chronic kidney disease), stage III (HCC)  N18.3   4. CLL (chronic lymphocytic leukemia) (HCC)  C91.10   5. Thrombocytopenia ([place])  D69.6   6. Obesity (BMI 30-39.9)  E66.9   7. Hyperlipidemia, unspecified hyperlipidemia type  E78.5   8. Hyperglycemia  R73.9 Hemoglobin A1c  9. Blurry vision  H53.8 Ambulatory referral to Ophthalmology    Meds ordered this encounter  Medications  . amLODipine (NORVASC) 10 MG tablet    Sig: Take 1 tablet (10 mg total) by mouth daily.    Dispense:  90 tablet    Refill:  3  . atenolol (TENORMIN) 100 MG tablet    Sig: Take 1 tablet (100 mg total) by mouth daily.    Dispense:  90 tablet    Refill:  3  . hydrochlorothiazide (HYDRODIURIL) 25 MG tablet    Sig: Take 1 tablet (25 mg total) by mouth daily.    Dispense:  90 tablet    Refill:  3  . losartan (COZAAR) 100 MG tablet    Sig: Take 1 tablet (100 mg total) by mouth daily.     Dispense:  90 tablet    Refill:  3   Return precautions advised.  [name], MD

## 2019-05-23 ENCOUNTER — Other Ambulatory Visit: Payer: Self-pay | Admitting: Family Medicine

## 2019-05-26 ENCOUNTER — Other Ambulatory Visit: Payer: Self-pay | Admitting: Family Medicine

## 2019-06-15 DIAGNOSIS — H40023 Open angle with borderline findings, high risk, bilateral: Secondary | ICD-10-CM | POA: Diagnosis not present

## 2019-06-15 DIAGNOSIS — H2513 Age-related nuclear cataract, bilateral: Secondary | ICD-10-CM | POA: Diagnosis not present

## 2019-06-26 ENCOUNTER — Telehealth: Payer: Self-pay | Admitting: Hematology and Oncology

## 2019-06-26 NOTE — Telephone Encounter (Signed)
Returned patient's phone call regarding cancelling 10/13 appointment, per patient's request appointment has been cancelled. Patient will call back when ready to reschedule.

## 2019-07-04 ENCOUNTER — Other Ambulatory Visit: Payer: PPO

## 2019-07-04 ENCOUNTER — Ambulatory Visit: Payer: PPO | Admitting: Hematology and Oncology

## 2019-07-21 DIAGNOSIS — H25811 Combined forms of age-related cataract, right eye: Secondary | ICD-10-CM | POA: Diagnosis not present

## 2019-07-23 HISTORY — PX: OTHER SURGICAL HISTORY: SHX169

## 2019-08-30 DIAGNOSIS — H2512 Age-related nuclear cataract, left eye: Secondary | ICD-10-CM | POA: Diagnosis not present

## 2019-08-31 HISTORY — PX: OTHER SURGICAL HISTORY: SHX169

## 2019-09-01 DIAGNOSIS — H25812 Combined forms of age-related cataract, left eye: Secondary | ICD-10-CM | POA: Diagnosis not present

## 2019-09-01 DIAGNOSIS — H268 Other specified cataract: Secondary | ICD-10-CM | POA: Diagnosis not present

## 2019-11-15 DIAGNOSIS — Z20822 Contact with and (suspected) exposure to covid-19: Secondary | ICD-10-CM | POA: Diagnosis not present

## 2019-11-15 DIAGNOSIS — Z20828 Contact with and (suspected) exposure to other viral communicable diseases: Secondary | ICD-10-CM | POA: Diagnosis not present

## 2019-11-22 ENCOUNTER — Other Ambulatory Visit: Payer: Self-pay

## 2019-11-23 ENCOUNTER — Encounter: Payer: Self-pay | Admitting: Family Medicine

## 2019-11-23 ENCOUNTER — Ambulatory Visit (INDEPENDENT_AMBULATORY_CARE_PROVIDER_SITE_OTHER): Payer: PPO | Admitting: Family Medicine

## 2019-11-23 VITALS — BP 122/78 | HR 69 | Temp 98.3°F | Ht 62.0 in | Wt 174.0 lb

## 2019-11-23 DIAGNOSIS — D696 Thrombocytopenia, unspecified: Secondary | ICD-10-CM

## 2019-11-23 DIAGNOSIS — R739 Hyperglycemia, unspecified: Secondary | ICD-10-CM

## 2019-11-23 DIAGNOSIS — C9110 Chronic lymphocytic leukemia of B-cell type not having achieved remission: Secondary | ICD-10-CM

## 2019-11-23 DIAGNOSIS — N183 Chronic kidney disease, stage 3 unspecified: Secondary | ICD-10-CM

## 2019-11-23 DIAGNOSIS — I10 Essential (primary) hypertension: Secondary | ICD-10-CM | POA: Diagnosis not present

## 2019-11-23 DIAGNOSIS — E785 Hyperlipidemia, unspecified: Secondary | ICD-10-CM | POA: Diagnosis not present

## 2019-11-23 LAB — CBC WITH DIFFERENTIAL/PLATELET
Basophils Absolute: 0 10*3/uL (ref 0.0–0.1)
Basophils Relative: 0.2 % (ref 0.0–3.0)
Eosinophils Absolute: 0.1 10*3/uL (ref 0.0–0.7)
Eosinophils Relative: 0.3 % (ref 0.0–5.0)
HCT: 44 % (ref 36.0–46.0)
Hemoglobin: 14.5 g/dL (ref 12.0–15.0)
Lymphocytes Relative: 75 % — ABNORMAL HIGH (ref 12.0–46.0)
Lymphs Abs: 17.4 10*3/uL — ABNORMAL HIGH (ref 0.7–4.0)
MCHC: 32.8 g/dL (ref 30.0–36.0)
MCV: 91.2 fl (ref 78.0–100.0)
Monocytes Absolute: 0.6 10*3/uL (ref 0.1–1.0)
Monocytes Relative: 2.5 % — ABNORMAL LOW (ref 3.0–12.0)
Neutro Abs: 5.1 10*3/uL (ref 1.4–7.7)
Neutrophils Relative %: 22 % — ABNORMAL LOW (ref 43.0–77.0)
Platelets: 109 10*3/uL — ABNORMAL LOW (ref 150.0–400.0)
RBC: 4.83 Mil/uL (ref 3.87–5.11)
RDW: 14.7 % (ref 11.5–15.5)
WBC: 23.2 10*3/uL (ref 4.0–10.5)

## 2019-11-23 LAB — BASIC METABOLIC PANEL
BUN: 23 mg/dL (ref 6–23)
CO2: 31 mEq/L (ref 19–32)
Calcium: 9.9 mg/dL (ref 8.4–10.5)
Chloride: 101 mEq/L (ref 96–112)
Creatinine, Ser: 0.93 mg/dL (ref 0.40–1.20)
GFR: 57.11 mL/min — ABNORMAL LOW (ref >60.00)
Glucose, Bld: 129 mg/dL — ABNORMAL HIGH (ref 70–99)
Potassium: 4.4 mEq/L (ref 3.5–5.1)
Sodium: 141 mEq/L (ref 135–145)

## 2019-11-23 LAB — HEMOGLOBIN A1C: Hgb A1c MFr Bld: 5.8 % (ref 4.6–6.5)

## 2019-11-23 NOTE — Patient Instructions (Addendum)
Please stop by lab before you go If you do not have mychart- we will call you about results within 5 business days of [place] receiving them.  If you have mychart- we will send your results within 3 business days of [place] receiving them.  If abnormal or we want to clarify a result, we will call or mychart you to make sure you receive the message.  If you have questions or concerns or don't hear within 5 business days, please send [place] a message or call us.   Great job taking good care of yourself. Hope you get the vaccine soon    Recommended follow up: Return in about 6 months (around 05/25/2020) for physical or sooner if needed.

## 2019-11-23 NOTE — Progress Notes (Signed)
Phone [phone] In person visit   Subjective:   Maria Lowe is a 86 y.o. year old very pleasant female patient who presents for/with See problem oriented charting Chief Complaint  Patient presents with  . Hypertension  . Hyperlipidemia    This visit occurred during the SARS-CoV-2 public health emergency.  Safety protocols were in place, including screening questions prior to the visit, additional usage of staff PPE, and extensive cleaning of exam room while observing appropriate contact time as indicated for disinfecting solutions.   Past Medical History-  Patient Active Problem List   Diagnosis Date Noted  . Thrombocytopenia ([place]) 11/26/2014    Priority: High  . CLL (chronic lymphocytic leukemia) ([place]) 11/27/2013    Priority: High  . CKD (chronic kidney disease), stage III 02/09/2018    Priority: Medium  . Hyperglycemia 05/08/2013    Priority: Medium  . Essential hypertension, benign 11/07/2012    Priority: Medium  . Rash 02/09/2018    Priority: Low  . Seborrheic dermatitis 01/08/2017    Priority: Low  . Former smoker 09/30/2015    Priority: Low  . Uterine prolapse 03/29/2015    Priority: Low  . Obesity, unspecified 11/07/2012    Priority: Low  . Hyperlipidemia, unspecified 04/07/2018    Medications- reviewed and updated Current Outpatient Medications  Medication Sig Dispense Refill  . amLODipine (NORVASC) 10 MG tablet Take 1 tablet (10 mg total) by mouth daily. 90 tablet 3  . atenolol (TENORMIN) 100 MG tablet TAKE 1 TABLET BY MOUTH EVERY DAY 90 tablet 1  . diclofenac sodium (VOLTAREN) 1 % GEL Apply 2 g topically 4 (four) times daily. 100 g 3  . hydrochlorothiazide (HYDRODIURIL) 25 MG tablet Take 1 tablet (25 mg total) by mouth daily. 90 tablet 3  . losartan (COZAAR) 100 MG tablet TAKE 1 TABLET BY MOUTH EVERY DAY 30 tablet 0  . triamcinolone cream (KENALOG) 0.1 % Apply 1 application topically 2 (two) times daily. For 7-10 days maximum 80 g 0   No current  facility-administered medications for this visit.     Objective:  BP 122/78   Pulse 69   Temp 98.3 F (36.8 C) (Temporal)   Ht 5\' 2"  (1.575 m)   Wt 174 lb (78.9 kg)   SpO2 94%   BMI 31.83 kg/m  Gen: NAD, resting comfortably CV: RRR no murmurs rubs or gallops Lungs: CTAB no crackles, wheeze, rhonchi  Ext: no edema Skin: warm, dry Neuro: grossly normal, moves all extremities    Assessment and Plan   # Hypertension S:Taking Atenolol 100 mg daily, Amlodipine 10 mg daily, Losartan 100 mg daily, and HCTZ 25 mg daily. A/P:  Stable. Continue current medications.     # CKD III  S:  Knows to avoid NSAIDs.  GFR has been stable in the high 50s.  Does have a high BUN to creatinine ratio so likely dehydration contributes A/P: Hopefully stable-update BMP today.  Encouraged patient to stay well-hydrated and continue to avoid NSAIDs  # Hyperglycemia  S:last A1c increased slightly. Not checking glucose at home. Keeping  a good diet no sugar in her diet.  Exercise-tries to do some exercises with body movements at home- twice a dya for 10 minutes   Lab Results  Component Value Date   HGBA1C 6.0 05/18/2019   HGBA1C 5.9 04/07/2018   HGBA1C 6.0 06/25/2016  A/P: Hopefully controlled-we discussed healthy eating/regular exercise to help prevent progression of diabetes today  #CLL/thrombocytopenia-patient canceled visit in October with oncology-we will update  CBC today and encouraged follow-up with oncology after she has been vaccinated for COVID-19. She prefers to wait for johnson and johnson vaccine. Son did get covid 34 recently- luckily even though she was around him tested negative and has not had symptoms - check cbc today and continue to monitor  # wanting to go to [place] this year  Recommended follow up: Return in about 6 months (around 05/25/2020) for physical or sooner if needed.  Lab/Order associations:   ICD-10-CM   1. Essential hypertension, benign  I10   2. Stage 3 chronic kidney  disease, unspecified whether stage 3a or 3b CKD  [id number] Basic metabolic panel  3. CLL (chronic lymphocytic leukemia) (HCC)  C91.10 CBC with Differential/Platelet  4. Thrombocytopenia (HCC)  D69.6 CBC with Differential/Platelet  5. Hyperglycemia  R73.9 Hemoglobin A1c  6. Hyperlipidemia, unspecified hyperlipidemia type  E78.5    Return precautions advised.  [name], MD

## 2020-02-07 ENCOUNTER — Telehealth: Payer: Self-pay | Admitting: Family Medicine

## 2020-02-07 NOTE — Telephone Encounter (Signed)
Chief Complaint Headache Reason for Call Symptomatic / Request for Health Information Initial Comment Caller states she is nauseas and has a headache and her skin hurts. Translation No Nurse Assessment Nurse: Thad Ranger, RN, Denise Date/Time (Eastern Time): 02/07/2020 8:34:59 AM Confirm and document reason for call. If symptomatic, describe symptoms. ---Caller states she has nausea, headache, and her skin hurts. Has the patient had close contact with a person known or suspected to have the novel coronavirus illness OR traveled / lives in area with major community spread (including international travel) in the last 14 days from the onset of symptoms? * If Asymptomatic, screen for exposure and travel within the last 14 days. ---No Does the patient have any new or worsening symptoms? ---Yes Will a triage be completed? ---Yes Related visit to physician within the last 2 weeks? ---No Does the PT have any chronic conditions? (i.e. diabetes, asthma, this includes High risk factors for pregnancy, etc.) ---No Is this a behavioral health or substance abuse call? ---No Guidelines Guideline Title Affirmed Question Affirmed Notes Nurse Date/Time (Eastern Time) Headache [1] New headache AND [2] age > 22 Carmon, RN, Langley Gauss 02/07/2020 8:39:27 AM Disp. Time Eilene Ghazi Time) Disposition Final User 02/07/2020 8:43:43 AM See PCP within 24 Hours Yes Carmon, RN, DenisePLEASE NOTE: All timestamps contained within this report are represented as [place] Standard Time. CONFIDENTIALTY NOTICE: This fax transmission is intended only for the addressee. It contains information that is legally privileged, confidential or otherwise protected from use or disclosure. If you are not the intended recipient, you are strictly prohibited from reviewing, disclosing, copying using or disseminating any of this information or taking any action in reliance on or regarding this information. If you have received this fax in error, please  notify us immediately by telephone so that we can arrange for its return to [place]. Phone: [phone], Toll-Free: [phone], Fax: [phone] Page: 2 of 2 Call Id: [id number] [place] Disagree/Comply Comply Caller Understands Yes PreDisposition Call Doctor Care Advice Given Per Guideline SEE PCP WITHIN 24 HOURS: PAIN MEDICINES: * ACETAMINOPHEN - EXTRA STRENGTH TYLENOL: Take 1,000 mg (two 500 mg pills) every 8 hours as needed. Each Extra Strength Tylenol pill has 500 mg of acetaminophen. The most you should take each day is 3,000 mg (6 pills a day). REST: * Lie down in a dark quiet place and relax until feeling better. CALL BACK IF: CARE ADVICE given per Headache (Adult) guideline. * You become worse. Referrals REFERRED TO PCP OFFICE

## 2020-02-07 NOTE — Telephone Encounter (Signed)
thanks

## 2020-02-07 NOTE — Patient Instructions (Addendum)
I am concerned you may have a nerve root in your neck that is irritated.  I think this is causing your pain and weakness in the left arm.  We will get x-rays today  I want you to start prednisone today and continue for 7 days-if not making any improvement by Monday or Tuesday please let me know as I would like to refer you to sports medicine.  If you have worsening symptoms over the weekend I want you to seek care-particularly if the weakness in the arm worsens or if you have other new or worsening symptoms  My hope is that you are at least 50% better within 7 to 14 days  Please stop by x-ray before you go If you have mychart- we will send your results within 3 business days of [place] receiving them.  If you do not have mychart- we will call you about results within 5 business days of [place] receiving them.

## 2020-02-07 NOTE — Progress Notes (Signed)
Phone [phone] In person visit   Subjective:   Maria Lowe is a 86 y.o. year old very pleasant female patient who presents for/with See problem oriented charting Chief Complaint  Patient presents with  . Neck Pain    left side x 5 days    This visit occurred during the SARS-CoV-2 public health emergency.  Safety protocols were in place, including screening questions prior to the visit, additional usage of staff PPE, and extensive cleaning of exam room while observing appropriate contact time as indicated for disinfecting solutions.   Past Medical History-  Patient Active Problem List   Diagnosis Date Noted  . Thrombocytopenia ([place]) 11/26/2014    Priority: High  . CLL (chronic lymphocytic leukemia) ([place]) 11/27/2013    Priority: High  . Hyperlipidemia, unspecified 04/07/2018    Priority: Medium  . CKD (chronic kidney disease), stage III 02/09/2018    Priority: Medium  . Hyperglycemia 05/08/2013    Priority: Medium  . Essential hypertension, benign 11/07/2012    Priority: Medium  . Rash 02/09/2018    Priority: Low  . Seborrheic dermatitis 01/08/2017    Priority: Low  . Former smoker 09/30/2015    Priority: Low  . Uterine prolapse 03/29/2015    Priority: Low  . Obesity, unspecified 11/07/2012    Priority: Low    Medications- reviewed and updated Current Outpatient Medications  Medication Sig Dispense Refill  . amLODipine (NORVASC) 10 MG tablet Take 1 tablet (10 mg total) by mouth daily. 90 tablet 3  . atenolol (TENORMIN) 100 MG tablet TAKE 1 TABLET BY MOUTH EVERY DAY 90 tablet 1  . hydrochlorothiazide (HYDRODIURIL) 25 MG tablet Take 1 tablet (25 mg total) by mouth daily. 90 tablet 3  . losartan (COZAAR) 100 MG tablet TAKE 1 TABLET BY MOUTH EVERY DAY 30 tablet 0  . triamcinolone cream (KENALOG) 0.1 % Apply 1 application topically 2 (two) times daily. For 7-10 days maximum 80 g 0  . predniSONE (DELTASONE) 50 MG tablet Take 1 tablet (50 mg total) by mouth daily with  breakfast. 7 tablet 0   No current facility-administered medications for this visit.     Objective:  BP 128/74   Pulse 71   Temp 98.7 F (37.1 C) (Temporal)   Ht 5\' 2"  (1.575 m)   Wt 172 lb (78 kg)   SpO2 96%   BMI 31.46 kg/m  Gen: NAD, resting comfortably CV: RRR no murmurs rubs or gallops Lungs: CTAB no crackles, wheeze, rhonchi Abdomen: soft/nontender/nondistended/normal bowel sounds.   Ext: no edema Skin: warm, dry Neuro: Patient with normal strength at biceps and triceps-4- out of 5 strength with abduction of the shoulder or forward flexion. MSK: Spurling test positive for pain and paresthesias on the left    Assessment and Plan  # Neck pain  S: Left side neck pain that radiates into shoulder (feels like started at the shoulder and moved up to her neck around c1). Started about five days ago. 8/10 pain even moved her to tears at times.  She has tried tylenol but has not had any improvement. Sensation of burning skin sensation on back of her neck and tingling area and down into her shoulder area.   Did have temporary improvement last night but is increasing today and thinks will reach 8/10 later this afternoon  Feels like harder to lift her left arm.   covid vaccine over a month ago- unlikely to be related.   A/P: Concern for left cervical radiculopathy.  Patient without  pain with palpation and without muscle spasm but with significant pain as well as weakness in the left arm.  Positive Spurling test to the left  From AVS "  Patient Instructions  I am concerned you may have a nerve root in your neck that is irritated.  I think this is causing your pain and weakness in the left arm.  We will get x-rays today  I want you to start prednisone today and continue for 7 days-if not making any improvement by Monday or Tuesday please let me know as I would like to refer you to sports medicine.  If you have worsening symptoms over the weekend I want you to seek  care-particularly if the weakness in the arm worsens or if you have other new or worsening symptoms  My hope is that you are at least 50% better within 7 to 14 days "    # Hyperglycemia/insulin resistance/prediabetes S:  Medication: None Lab Results  Component Value Date   HGBA1C 5.8 11/23/2019   HGBA1C 6.0 05/18/2019   HGBA1C 5.9 04/07/2018   A/P: Thankfully last A1c was well controlled-we discussed prednisone may raise blood sugar short-term   #hypertension S: medication: Amlodipine 10 mg, and atenolol 100 mg, hydrochlorothiazide 25 mg, losartan 100 mg BP Readings from Last 3 Encounters:  02/09/20 128/74  11/23/19 122/78  05/18/19 126/84  A/P: Good control of blood pressure-I believe blood pressure can tolerate prednisone.  #CLL/thrombocytopenia -she is overdue for yearly visit.  Needs referral back to oncology-she has had a hard time through the phone system.  Referral was placed today   Recommended follow up: Return for as needed for new, worsening, persistent symptoms. Future Appointments  Date Time Provider [place]  05/30/2020  1:20 PM [name], MD [facility]   Lab/Order associations:   ICD-10-CM   1. Left cervical radiculopathy  M54.12 DG Cervical Spine Complete  2. Essential hypertension, benign  I10   3. Hyperglycemia  R73.9   4. CLL (chronic lymphocytic leukemia) ([place])  C91.10 Ambulatory referral to Oncology  5. Thrombocytopenia ([place])  D69.6 Ambulatory referral to Oncology  6. Neck pain on left side  M54.2 DG Cervical Spine Complete    Meds ordered this encounter  Medications  . predniSONE (DELTASONE) 50 MG tablet    Sig: Take 1 tablet (50 mg total) by mouth daily with breakfast.    Dispense:  7 tablet    Refill:  0    Return precautions advised.  [name], MD

## 2020-02-07 NOTE — Telephone Encounter (Signed)
Called patient wanted to only see you. Made app for Friday Reviewed red words with her and when she should go to ED or urgent care.

## 2020-02-09 ENCOUNTER — Ambulatory Visit (INDEPENDENT_AMBULATORY_CARE_PROVIDER_SITE_OTHER): Payer: PPO

## 2020-02-09 ENCOUNTER — Other Ambulatory Visit: Payer: Self-pay

## 2020-02-09 ENCOUNTER — Encounter: Payer: Self-pay | Admitting: Family Medicine

## 2020-02-09 ENCOUNTER — Ambulatory Visit (INDEPENDENT_AMBULATORY_CARE_PROVIDER_SITE_OTHER): Payer: PPO | Admitting: Family Medicine

## 2020-02-09 VITALS — BP 128/74 | HR 71 | Temp 98.7°F | Ht 62.0 in | Wt 172.0 lb

## 2020-02-09 DIAGNOSIS — D696 Thrombocytopenia, unspecified: Secondary | ICD-10-CM | POA: Diagnosis not present

## 2020-02-09 DIAGNOSIS — I10 Essential (primary) hypertension: Secondary | ICD-10-CM | POA: Diagnosis not present

## 2020-02-09 DIAGNOSIS — M542 Cervicalgia: Secondary | ICD-10-CM

## 2020-02-09 DIAGNOSIS — C9110 Chronic lymphocytic leukemia of B-cell type not having achieved remission: Secondary | ICD-10-CM | POA: Diagnosis not present

## 2020-02-09 DIAGNOSIS — M5412 Radiculopathy, cervical region: Secondary | ICD-10-CM

## 2020-02-09 DIAGNOSIS — R739 Hyperglycemia, unspecified: Secondary | ICD-10-CM | POA: Diagnosis not present

## 2020-02-09 DIAGNOSIS — M47812 Spondylosis without myelopathy or radiculopathy, cervical region: Secondary | ICD-10-CM | POA: Diagnosis not present

## 2020-02-09 MED ORDER — PREDNISONE 50 MG PO TABS: 50.0000 mg | ORAL_TABLET | Freq: Every day | ORAL | 0 refills | Status: DC

## 2020-02-13 ENCOUNTER — Telehealth: Payer: Self-pay | Admitting: Hematology and Oncology

## 2020-02-13 NOTE — Telephone Encounter (Signed)
Scheduled per 5/25 sch message. Pt aware of appts 6/8.

## 2020-02-26 ENCOUNTER — Other Ambulatory Visit: Payer: Self-pay | Admitting: Hematology and Oncology

## 2020-02-26 DIAGNOSIS — C9110 Chronic lymphocytic leukemia of B-cell type not having achieved remission: Secondary | ICD-10-CM

## 2020-02-27 ENCOUNTER — Telehealth: Payer: Self-pay | Admitting: Hematology and Oncology

## 2020-02-27 ENCOUNTER — Inpatient Hospital Stay: Payer: PPO | Attending: Hematology and Oncology | Admitting: Hematology and Oncology

## 2020-02-27 ENCOUNTER — Other Ambulatory Visit: Payer: Self-pay

## 2020-02-27 ENCOUNTER — Inpatient Hospital Stay: Payer: PPO

## 2020-02-27 ENCOUNTER — Encounter: Payer: Self-pay | Admitting: Hematology and Oncology

## 2020-02-27 DIAGNOSIS — C9110 Chronic lymphocytic leukemia of B-cell type not having achieved remission: Secondary | ICD-10-CM | POA: Insufficient documentation

## 2020-02-27 DIAGNOSIS — Z7952 Long term (current) use of systemic steroids: Secondary | ICD-10-CM | POA: Diagnosis not present

## 2020-02-27 DIAGNOSIS — D696 Thrombocytopenia, unspecified: Secondary | ICD-10-CM

## 2020-02-27 DIAGNOSIS — Z79899 Other long term (current) drug therapy: Secondary | ICD-10-CM | POA: Insufficient documentation

## 2020-02-27 LAB — CBC WITH DIFFERENTIAL/PLATELET
Abs Immature Granulocytes: 0.06 10*3/uL (ref 0.00–0.07)
Basophils Absolute: 0.1 10*3/uL (ref 0.0–0.1)
Basophils Relative: 0 %
Eosinophils Absolute: 0.1 10*3/uL (ref 0.0–0.5)
Eosinophils Relative: 0 %
HCT: 43.1 % (ref 36.0–46.0)
Hemoglobin: 14.1 g/dL (ref 12.0–15.0)
Immature Granulocytes: 0 %
Lymphocytes Relative: 77 %
Lymphs Abs: 20.1 10*3/uL — ABNORMAL HIGH (ref 0.7–4.0)
MCH: 29.3 pg (ref 26.0–34.0)
MCHC: 32.7 g/dL (ref 30.0–36.0)
MCV: 89.6 fL (ref 80.0–100.0)
Monocytes Absolute: 1 10*3/uL (ref 0.1–1.0)
Monocytes Relative: 4 %
Neutro Abs: 4.9 10*3/uL (ref 1.7–7.7)
Neutrophils Relative %: 19 %
Platelets: 107 10*3/uL — ABNORMAL LOW (ref 150–400)
RBC: 4.81 MIL/uL (ref 3.87–5.11)
RDW: 14 % (ref 11.5–15.5)
WBC: 26.1 10*3/uL — ABNORMAL HIGH (ref 4.0–10.5)
nRBC: 0 % (ref 0.0–0.2)

## 2020-02-27 NOTE — Assessment & Plan Note (Signed)
Clinically, she has no signs of disease progression except for thrombocytopenia.  She is not symptomatic The patient is educated to watch out for signs and symptoms of progression I plan to see her back in 12 months for further blood count monitoring We discussed the importance of annual influenza vaccination

## 2020-02-27 NOTE — Assessment & Plan Note (Signed)
This is related to CLL but she is not symptomatic.  Continue observation only. The patient is educated to watch out for signs and symptoms of bleeding

## 2020-02-27 NOTE — Progress Notes (Signed)
[place] OFFICE PROGRESS NOTE  Patient Care Team: [name], MD as PCP - General (Family Medicine) Heath Lark, MD as Consulting Physician (Hematology and Oncology)  ASSESSMENT & PLAN:  CLL (chronic lymphocytic leukemia) ([place]) Clinically, she has no signs of disease progression except for thrombocytopenia.  She is not symptomatic The patient is educated to watch out for signs and symptoms of progression I plan to see her back in 12 months for further blood count monitoring We discussed the importance of annual influenza vaccination  Thrombocytopenia ([place]) This is related to CLL but she is not symptomatic.  Continue observation only. The patient is educated to watch out for signs and symptoms of bleeding   Orders Placed This Encounter  Procedures  . CBC with Differential/Platelet    Standing Status:   Future    Standing Expiration Date:   02/26/2021    All questions were answered. The patient knows to call the clinic with any problems, questions or concerns. The total time spent in the appointment was 15 minutes encounter with patients including review of chart and various tests results, discussions about plan of care and coordination of care plan   Heath Lark, MD 02/27/2020 10:05 AM  INTERVAL HISTORY: Please see below for problem oriented charting. She returns for further follow-up She is doing well No new lymphadenopathy She is doing well Appetite is fair, without any recent weight loss No recent infection The patient denies any recent signs or symptoms of bleeding such as spontaneous epistaxis, hematuria or hematochezia.  SUMMARY OF ONCOLOGIC HISTORY:  This patient was diagnosed with CLL around 2009 from abnormal leukocytosis and mild thrombocytopenia. She is asymptomatic. She is being observed. FISH analysis in 2015 showed deletion 13 q. She was recommended observation only  REVIEW OF SYSTEMS:   Constitutional: Denies fevers, chills or  abnormal weight loss Eyes: Denies blurriness of vision Ears, nose, mouth, throat, and face: Denies mucositis or sore throat Respiratory: Denies cough, dyspnea or wheezes Cardiovascular: Denies palpitation, chest discomfort or lower extremity swelling Gastrointestinal:  Denies nausea, heartburn or change in bowel habits Skin: Denies abnormal skin rashes Lymphatics: Denies new lymphadenopathy or easy bruising Neurological:Denies numbness, tingling or new weaknesses Behavioral/Psych: Mood is stable, no new changes  All other systems were reviewed with the patient and are negative.  I have reviewed the past medical history, past surgical history, social history and family history with the patient and they are unchanged from previous note.  ALLERGIES:  has No Known Allergies.  MEDICATIONS:  Current Outpatient Medications  Medication Sig Dispense Refill  . amLODipine (NORVASC) 10 MG tablet Take 1 tablet (10 mg total) by mouth daily. 90 tablet 3  . atenolol (TENORMIN) 100 MG tablet TAKE 1 TABLET BY MOUTH EVERY DAY 90 tablet 1  . hydrochlorothiazide (HYDRODIURIL) 25 MG tablet Take 1 tablet (25 mg total) by mouth daily. 90 tablet 3  . losartan (COZAAR) 100 MG tablet TAKE 1 TABLET BY MOUTH EVERY DAY 30 tablet 0  . predniSONE (DELTASONE) 50 MG tablet Take 1 tablet (50 mg total) by mouth daily with breakfast. 7 tablet 0  . triamcinolone cream (KENALOG) 0.1 % Apply 1 application topically 2 (two) times daily. For 7-10 days maximum 80 g 0   No current facility-administered medications for this visit.    PHYSICAL EXAMINATION: ECOG PERFORMANCE STATUS: 1 - Symptomatic but completely ambulatory  Vitals:   02/27/20 0953  BP: 140/61  Pulse: 66  Resp: 18  Temp: 98.2 F (36.8 C)  SpO2: 98%   Filed Weights   02/27/20 0953  Weight: 175 lb 12.8 oz (79.7 kg)    GENERAL:alert, no distress and comfortable SKIN: skin color, texture, turgor are normal, no rashes or significant lesions EYES: normal,  Conjunctiva are pink and non-injected, sclera clear OROPHARYNX:no exudate, no erythema and lips, buccal mucosa, and tongue normal  NECK: supple, thyroid normal size, non-tender, without nodularity LYMPH:  no palpable lymphadenopathy in the cervical, axillary or inguinal LUNGS: clear to auscultation and percussion with normal breathing effort HEART: regular rate & rhythm and no murmurs and no lower extremity edema ABDOMEN:abdomen soft, non-tender and normal bowel sounds Musculoskeletal:no cyanosis of digits and no clubbing  NEURO: alert & oriented x 3 with fluent speech, no focal motor/sensory deficits  LABORATORY DATA:  I have reviewed the data as listed    Component Value Date/Time   NA 141 11/23/2019 1311   NA 141 11/28/2015 0959   K 4.4 11/23/2019 1311   K 4.3 11/28/2015 0959   CL 101 11/23/2019 1311   CO2 31 11/23/2019 1311   CO2 27 11/28/2015 0959   GLUCOSE 129 (H) 11/23/2019 1311   GLUCOSE 113 11/28/2015 0959   BUN 23 11/23/2019 1311   BUN 23.6 11/28/2015 0959   CREATININE 0.93 11/23/2019 1311   CREATININE 1.0 11/28/2015 0959   CALCIUM 9.9 11/23/2019 1311   CALCIUM 9.5 11/28/2015 0959   PROT 6.4 05/18/2019 1329   PROT 6.6 11/28/2015 0959   ALBUMIN 4.6 05/18/2019 1329   ALBUMIN 4.2 11/28/2015 0959   AST 20 05/18/2019 1329   AST 17 11/28/2015 0959   ALT 12 05/18/2019 1329   ALT 15 11/28/2015 0959   ALKPHOS 69 05/18/2019 1329   ALKPHOS 72 11/28/2015 0959   BILITOT 0.5 05/18/2019 1329   BILITOT 0.64 11/28/2015 0959   GFRNONAA 49 (L) 12/28/2017 0829   GFRAA 56 (L) 12/28/2017 0829    No results found for: SPEP, UPEP  Lab Results  Component Value Date   WBC 26.1 (H) 02/27/2020   NEUTROABS 4.9 02/27/2020   HGB 14.1 02/27/2020   HCT 43.1 02/27/2020   MCV 89.6 02/27/2020   PLT 107 (L) 02/27/2020      Chemistry      Component Value Date/Time   NA 141 11/23/2019 1311   NA 141 11/28/2015 0959   K 4.4 11/23/2019 1311   K 4.3 11/28/2015 0959   CL 101 11/23/2019  1311   CO2 31 11/23/2019 1311   CO2 27 11/28/2015 0959   BUN 23 11/23/2019 1311   BUN 23.6 11/28/2015 0959   CREATININE 0.93 11/23/2019 1311   CREATININE 1.0 11/28/2015 0959      Component Value Date/Time   CALCIUM 9.9 11/23/2019 1311   CALCIUM 9.5 11/28/2015 0959   ALKPHOS 69 05/18/2019 1329   ALKPHOS 72 11/28/2015 0959   AST 20 05/18/2019 1329   AST 17 11/28/2015 0959   ALT 12 05/18/2019 1329   ALT 15 11/28/2015 0959   BILITOT 0.5 05/18/2019 1329   BILITOT 0.64 11/28/2015 0959       RADIOGRAPHIC STUDIES: I have personally reviewed the radiological images as listed and agreed with the findings in the report. DG Cervical Spine Complete  Result Date: 02/09/2020 CLINICAL DATA:  Left arm weakness EXAM: CERVICAL SPINE - COMPLETE 4+ VIEW COMPARISON:  None. FINDINGS: Seven cervical segments are well visualized. Vertebral body height is well maintained. Disc space narrowing is noted at C5-6 and C6-7 with associated osteophytic changes. Facet hypertrophic changes are  noted with mild degenerative anterolisthesis of C3 on C4 and C4 on C5. Mild neural foraminal narrowing is noted bilaterally. No acute fracture or acute facet abnormality is seen. The odontoid is within normal limits. No soft tissue abnormality is seen. IMPRESSION: Degenerative change without acute abnormality. Electronically Signed   By: Inez Catalina M.D.   On: 02/09/2020 15:36

## 2020-02-27 NOTE — Telephone Encounter (Signed)
Scheduled per 6/8 sch message. Mailing calendar to pt.

## 2020-04-04 DIAGNOSIS — H40023 Open angle with borderline findings, high risk, bilateral: Secondary | ICD-10-CM | POA: Diagnosis not present

## 2020-04-04 DIAGNOSIS — Z961 Presence of intraocular lens: Secondary | ICD-10-CM | POA: Diagnosis not present

## 2020-05-01 ENCOUNTER — Telehealth: Payer: Self-pay | Admitting: Family Medicine

## 2020-05-01 NOTE — Progress Notes (Signed)
°  Chronic Care Management   Note  05/01/2020 Name: Maria Lowe MRN: [id number] DOB: 27-Mar-1931  Maria Lowe is a 86 y.o. year old female who is a primary care patient of Hunter, Brayton Mars, MD. I reached out to [place] by phone today in response to a referral sent by Maria Lowe's PCP, [name], MD.   Maria Lowe was given information about Chronic Care Management services today including:  1. CCM service includes personalized support from designated clinical staff supervised by her physician, including individualized plan of care and coordination with other care providers 2. 24/7 contact phone numbers for assistance for urgent and routine care needs. 3. Service will only be billed when office clinical staff spend 20 minutes or more in a month to coordinate care. 4. Only one practitioner may furnish and bill the service in a calendar month. 5. The patient may stop CCM services at any time (effective at the end of the month) by phone call to the office staff.   Patient agreed to services and verbal consent obtained.   Follow up plan:   Earney [place] [facility] Scheduler

## 2020-05-28 NOTE — Progress Notes (Deleted)
Duplicate note

## 2020-05-28 NOTE — Patient Instructions (Addendum)
Health Maintenance Due  Topic Date Due  . INFLUENZA VACCINE - declined will get at pharmacy later date- please let us know the date so we can input 04/21/2020   Schedule your bone density test at check out desk. You may also call directly to X-ray at [phone] to schedule an appointment that is convenient for you.  - located [address] across the street from [place] - in the basement - you do need an appointment for the bone density tests.   We will call you within two weeks about your referral to dermatology and home health physical therapy. If you do not hear within 3 weeks, give [place] a call.   Please stop by lab before you go If you have mychart- we will send your results within 3 business days of [place] receiving them.  If you do not have mychart- we will call you about results within 5 business days of [place] receiving them.  *please note we are currently using [facility] which has a longer processing time than [facility] typically so labs may not come back as quickly as in the past *please also note that you will see labs on mychart as soon as they post. I will later go in and write notes on them- will say "notes from Dr. Yong Channel"

## 2020-05-30 ENCOUNTER — Ambulatory Visit (INDEPENDENT_AMBULATORY_CARE_PROVIDER_SITE_OTHER): Payer: PPO | Admitting: Family Medicine

## 2020-05-30 ENCOUNTER — Other Ambulatory Visit: Payer: Self-pay

## 2020-05-30 ENCOUNTER — Encounter: Payer: Self-pay | Admitting: Family Medicine

## 2020-05-30 VITALS — BP 126/62 | HR 68 | Temp 98.6°F | Ht 62.0 in | Wt 171.2 lb

## 2020-05-30 DIAGNOSIS — R269 Unspecified abnormalities of gait and mobility: Secondary | ICD-10-CM | POA: Diagnosis not present

## 2020-05-30 DIAGNOSIS — R2689 Other abnormalities of gait and mobility: Secondary | ICD-10-CM | POA: Diagnosis not present

## 2020-05-30 DIAGNOSIS — R739 Hyperglycemia, unspecified: Secondary | ICD-10-CM | POA: Diagnosis not present

## 2020-05-30 DIAGNOSIS — E785 Hyperlipidemia, unspecified: Secondary | ICD-10-CM

## 2020-05-30 DIAGNOSIS — Z0000 Encounter for general adult medical examination without abnormal findings: Secondary | ICD-10-CM | POA: Diagnosis not present

## 2020-05-30 DIAGNOSIS — L989 Disorder of the skin and subcutaneous tissue, unspecified: Secondary | ICD-10-CM

## 2020-05-30 DIAGNOSIS — I10 Essential (primary) hypertension: Secondary | ICD-10-CM

## 2020-05-30 DIAGNOSIS — Z780 Asymptomatic menopausal state: Secondary | ICD-10-CM | POA: Diagnosis not present

## 2020-05-30 MED ORDER — HYDROCHLOROTHIAZIDE 25 MG PO TABS
25.0000 mg | ORAL_TABLET | Freq: Every day | ORAL | 3 refills | Status: DC
Start: 2020-05-30 — End: 2021-05-20

## 2020-05-30 MED ORDER — AMLODIPINE BESYLATE 10 MG PO TABS
10.0000 mg | ORAL_TABLET | Freq: Every day | ORAL | 3 refills | Status: DC
Start: 2020-05-30 — End: 2021-05-12

## 2020-05-30 MED ORDER — AMLODIPINE BESYLATE 10 MG PO TABS: 10.0000 mg | ORAL_TABLET | Freq: Every day | ORAL | 3 refills | Status: DC

## 2020-05-30 MED ORDER — HYDROCHLOROTHIAZIDE 25 MG PO TABS
25.0000 mg | ORAL_TABLET | Freq: Every day | ORAL | 3 refills | Status: DC
Start: 2020-05-30 — End: 2020-05-30

## 2020-05-30 MED ORDER — ATENOLOL 100 MG PO TABS
100.0000 mg | ORAL_TABLET | Freq: Every day | ORAL | 3 refills | Status: DC
Start: 2020-05-30 — End: 2021-06-24

## 2020-05-30 MED ORDER — LOSARTAN POTASSIUM 100 MG PO TABS
100.0000 mg | ORAL_TABLET | Freq: Every day | ORAL | 3 refills | Status: DC
Start: 2020-05-30 — End: 2021-05-27

## 2020-05-30 NOTE — Addendum Note (Signed)
Addended by: Francella Solian on: 05/30/2020 02:21 PM   Modules accepted: Orders

## 2020-05-30 NOTE — Progress Notes (Signed)
Phone [phone]   Subjective:  Patient presents today for their annual physical. Chief complaint-noted.   See problem oriented charting- Review of Systems  Constitutional: Negative for chills and fever.  HENT: Negative for hearing loss and tinnitus.   Eyes: Negative for blurred vision and double vision.  Respiratory: Negative for cough, shortness of breath and wheezing.   Cardiovascular: Positive for leg swelling. Negative for chest pain and palpitations.       B/l leg swelling. Doing well today   Gastrointestinal: Negative for heartburn, nausea and vomiting.  Genitourinary: Negative for dysuria, frequency and urgency.  Musculoskeletal: Positive for joint pain. Negative for back pain and neck pain.       Knee pain   Skin: Negative for rash.  Neurological: Negative for dizziness, seizures, weakness and headaches.  Endo/Heme/Allergies: Does not bruise/bleed easily.  Psychiatric/Behavioral: Negative for depression, memory loss and suicidal ideas. The patient does not have insomnia.    The following were reviewed and entered/updated in epic: Past Medical History:  Diagnosis Date  . Arthritis    knee  . CLL (chronic lymphocytic leukemia) ([place]) 11/27/2013  . Hypertension   . Ulcer    stomach ulcer when young   Patient Active Problem List   Diagnosis Date Noted  . Thrombocytopenia ([place]) 11/26/2014    Priority: High  . CLL (chronic lymphocytic leukemia) ([place]) 11/27/2013    Priority: High  . Hyperlipidemia, unspecified 04/07/2018    Priority: Medium  . CKD (chronic kidney disease), stage III 02/09/2018    Priority: Medium  . Hyperglycemia 05/08/2013    Priority: Medium  . Essential hypertension, benign 11/07/2012    Priority: Medium  . Rash 02/09/2018    Priority: Low  . Seborrheic dermatitis 01/08/2017    Priority: Low  . Former smoker 09/30/2015    Priority: Low  . Uterine prolapse 03/29/2015    Priority: Low  . Obesity, unspecified 11/07/2012    Priority: Low    Past Surgical History:  Procedure Laterality Date  . APPENDECTOMY    . catarat Right 07/23/2019  . catarat Left 08/31/2019  . TONSILLECTOMY AND ADENOIDECTOMY      Family History  Problem Relation Age of Onset  . Healthy Mother        died age 26 (communist [place] could not get records)  . Other Father        died young-unclear cause    Medications- reviewed and updated Current Outpatient Medications  Medication Sig Dispense Refill  . amLODipine (NORVASC) 10 MG tablet Take 1 tablet (10 mg total) by mouth daily. 90 tablet 3  . atenolol (TENORMIN) 100 MG tablet TAKE 1 TABLET BY MOUTH EVERY DAY 90 tablet 1  . hydrochlorothiazide (HYDRODIURIL) 25 MG tablet Take 1 tablet (25 mg total) by mouth daily. 90 tablet 3  . losartan (COZAAR) 100 MG tablet TAKE 1 TABLET BY MOUTH EVERY DAY 30 tablet 0  . triamcinolone cream (KENALOG) 0.1 % Apply 1 application topically 2 (two) times daily. For 7-10 days maximum 80 g 0   No current facility-administered medications for this visit.    Allergies-reviewed and updated No Known Allergies  Social History   Social History Narrative   Widowed April 2016. 3 children. 7 grandkids. 3 greatgrandkids.   Husband worked as Chief Executive Officer.       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 Oropharynx normal Neck: no thyromegaly CV: RRR no murmurs rubs or gallops Lungs: CTAB no crackles, wheeze, rhonchi Abdomen: soft/nontender/nondistended/normal bowel sounds. No rebound or guarding.  Ext: no edema Skin: warm, dry Neuro: grossly normal, moves all extremities, PERRLA   Assessment and Plan   86 y.o. female presenting for annual  physical.  Health Maintenance counseling: 1. Anticipatory guidance: Patient counseled regarding regular dental exams - does not go q6 months as wears dentures, eye exams yearly- removed,  avoiding smoking and second hand smoke , limiting alcohol to 1 beverage per day- wine 4 oz 1-2 times a day .   2. Risk factor reduction:  Advised patient of need for regular exercise and diet rich and fruits and vegetables to reduce risk of heart attack and stroke. Exercise- does not exercise- interested in PT for gait and balance training. Diet-  Weight down 3 pounds from last year physical Wt Readings from Last 3 Encounters:  05/30/20 171 lb 3.2 oz (77.7 kg)  02/27/20 175 lb 12.8 oz (79.7 kg)  02/09/20 172 lb (78 kg)  3. Immunizations/screenings/ancillary studies-flu shot planned in october.  Has declined Shingrix in the past-strongly believes she never had chickenpox but we have discussed still can get vaccination Immunization History  Administered Date(s) Administered  . Influenza Whole 06/30/2013  . Influenza, High Dose Seasonal PF 06/25/2016, 06/21/2019  . Influenza,inj,Quad PF,6+ Mos 06/13/2014  . Influenza-Unspecified 07/12/2015, 06/09/2017  . PFIZER SARS-COV-2 Vaccination 12/13/2019, 12/30/2019  . Pneumococcal Conjugate-13 06/25/2016  . Pneumococcal Polysaccharide-23 06/21/2013  . Td 04/07/2018  4. Cervical cancer screening- never with history of abnormal Pap smear.  Plus past age based screening recommendations 5. Breast cancer screening-  breast exam prefers to do self exams and mammogram-past age based screening recommendations per [place] PTF 6. Colon cancer screening - past age for screening recommendations, no blood in the stool or melena 7. Skin cancer screening-no dermatologist.referrral today for cheek lesion. advised regular sunscreen use.  8. Birth control/STD check-widowed and not sexually active 9. Osteoporosis screening at 70- dexa agreed to today for first time.  -Former smoker-quit in  1980s but no regular screening required  Status of chronic or acute concerns   Hypertension-taking atenolol 100 mg daily, amlodipine 10 mg daily, losartan 100 mg daily, hydrochlorothiazide 25 mg daily.  Blood pressure well controlled today-continue current medication. Ran high for a few days- may have been higher on salt.   CKD stage III-hopefully stable.  She knows to avoid NSAIDs.  Update CMP with labs today  Prediabetes/hyperglycemia-elevated A1c in the past-down to 5.8 last year.  Hopefully stable-update A1c with labs today   Lab Results  Component Value Date   HGBA1C 5.8 11/23/2019   HGBA1C 6.0 05/18/2019   HGBA1C 5.9 04/07/2018   CLL-follows with Dr. Alvy Bimler and has been stable.  We will update CBC with differential today.  Also has thrombocytopenia related to CLL-we will continue to monitor.  Voltaren gel not super helpful for her arthritis  Hyperlipidemia-mild high cholesterol but no history of heart attack or stroke-continue off medications for primary prevention above age 89 Lab Results  Component Value Date   CHOL 202 (H) 05/18/2019   HDL 68.50 05/18/2019   LDLCALC 105 (H) 05/18/2019   TRIG 142.0 05/18/2019  CHOLHDL 3 05/18/2019    Gait issues- feels not as stable- wants to look into home health PT. This was ordered today  Recommended follow up: Return in about 6 months (around 11/27/2020) for follow up- or sooner if needed. Future Appointments  Date Time Provider [place]  07/02/2020 11:00 AM [facility] CCM PHARMACIST [facility]  02/25/2021  9:00 AM [facility] LAB [facility] None  02/25/2021  9:30 AM Alvy Bimler, Ni, MD [facility] None   Lab/Order associations: not fasting   ICD-10-CM   1. Preventative health care  Z00.00   2. Essential hypertension, benign  I10   3. Hyperglycemia  R73.9   4. Hyperlipidemia, unspecified hyperlipidemia type  E78.5   5. Gait difficulty  R26.9 Ambulatory referral to Home Health  6. Balance disorder  R26.89 Ambulatory referral  to Home Health  7. Skin lesion  L98.9 Ambulatory referral to Dermatology  8. Postmenopausal  Z78.0     Meds ordered this encounter  Medications  . amLODipine (NORVASC) 10 MG tablet    Sig: Take 1 tablet (10 mg total) by mouth daily.    Dispense:  90 tablet    Refill:  3  . atenolol (TENORMIN) 100 MG tablet    Sig: Take 1 tablet (100 mg total) by mouth daily.    Dispense:  90 tablet    Refill:  3  . hydrochlorothiazide (HYDRODIURIL) 25 MG tablet    Sig: Take 1 tablet (25 mg total) by mouth daily.    Dispense:  90 tablet    Refill:  3  . losartan (COZAAR) 100 MG tablet    Sig: Take 1 tablet (100 mg total) by mouth daily.    Dispense:  90 tablet    Refill:  3    Return precautions advised.  [name], MD

## 2020-06-02 DIAGNOSIS — D696 Thrombocytopenia, unspecified: Secondary | ICD-10-CM | POA: Diagnosis not present

## 2020-06-02 DIAGNOSIS — M1710 Unilateral primary osteoarthritis, unspecified knee: Secondary | ICD-10-CM | POA: Diagnosis not present

## 2020-06-02 DIAGNOSIS — N183 Chronic kidney disease, stage 3 unspecified: Secondary | ICD-10-CM | POA: Diagnosis not present

## 2020-06-02 DIAGNOSIS — I129 Hypertensive chronic kidney disease with stage 1 through stage 4 chronic kidney disease, or unspecified chronic kidney disease: Secondary | ICD-10-CM | POA: Diagnosis not present

## 2020-06-02 DIAGNOSIS — Z87891 Personal history of nicotine dependence: Secondary | ICD-10-CM | POA: Diagnosis not present

## 2020-06-02 DIAGNOSIS — E669 Obesity, unspecified: Secondary | ICD-10-CM | POA: Diagnosis not present

## 2020-06-02 DIAGNOSIS — Z9181 History of falling: Secondary | ICD-10-CM | POA: Diagnosis not present

## 2020-06-02 DIAGNOSIS — E785 Hyperlipidemia, unspecified: Secondary | ICD-10-CM | POA: Diagnosis not present

## 2020-06-02 DIAGNOSIS — C9110 Chronic lymphocytic leukemia of B-cell type not having achieved remission: Secondary | ICD-10-CM | POA: Diagnosis not present

## 2020-06-03 LAB — CBC WITH DIFFERENTIAL/PLATELET
Absolute Monocytes: 721 cells/uL (ref 200–950)
Basophils Absolute: 80 cells/uL (ref 0–200)
Basophils Relative: 0.3 %
Eosinophils Absolute: 80 cells/uL (ref 15–500)
Eosinophils Relative: 0.3 %
HCT: 46.2 % — ABNORMAL HIGH (ref 35.0–45.0)
Hemoglobin: 15 g/dL (ref 11.7–15.5)
Lymphs Abs: 20372 cells/uL — ABNORMAL HIGH (ref 850–3900)
MCH: 30.2 pg (ref 27.0–33.0)
MCHC: 32.5 g/dL (ref 32.0–36.0)
MCV: 93 fL (ref 80.0–100.0)
MPV: 13.5 fL — ABNORMAL HIGH (ref 7.5–12.5)
Monocytes Relative: 2.7 %
Neutro Abs: 5447 cells/uL (ref 1500–7800)
Neutrophils Relative %: 20.4 %
Platelets: 125 10*3/uL — ABNORMAL LOW (ref 140–400)
RBC: 4.97 10*6/uL (ref 3.80–5.10)
RDW: 13.4 % (ref 11.0–15.0)
Total Lymphocyte: 76.3 %
WBC: 26.7 10*3/uL — ABNORMAL HIGH (ref 3.8–10.8)

## 2020-06-03 LAB — COMPLETE METABOLIC PANEL WITH GFR
AG Ratio: 3.2 (calc) — ABNORMAL HIGH (ref 1.0–2.5)
ALT: 13 U/L (ref 6–29)
AST: 19 U/L (ref 10–35)
Albumin: 4.8 g/dL (ref 3.6–5.1)
Alkaline phosphatase (APISO): 66 U/L (ref 37–153)
BUN/Creatinine Ratio: 20 (calc) (ref 6–22)
BUN: 18 mg/dL (ref 7–25)
CO2: 29 mmol/L (ref 20–32)
Calcium: 9.8 mg/dL (ref 8.6–10.4)
Chloride: 100 mmol/L (ref 98–110)
Creat: 0.89 mg/dL — ABNORMAL HIGH (ref 0.60–0.88)
GFR, Est African American: 68 mL/min/{1.73_m2} (ref >=60)
GFR, Est Non African American: 59 mL/min/{1.73_m2} — ABNORMAL LOW (ref >=60)
Globulin: 1.5 g/dL (calc) — ABNORMAL LOW (ref 1.9–3.7)
Glucose, Bld: 105 mg/dL — ABNORMAL HIGH (ref 65–99)
Potassium: 4.4 mmol/L (ref 3.5–5.3)
Sodium: 141 mmol/L (ref 135–146)
Total Bilirubin: 0.6 mg/dL (ref 0.2–1.2)
Total Protein: 6.3 g/dL (ref 6.1–8.1)

## 2020-06-03 LAB — HEMOGLOBIN A1C
Hgb A1c MFr Bld: 5.5 % of total Hgb (ref <5.7)
Mean Plasma Glucose: 111 (calc)
eAG (mmol/L): 6.2 (calc)

## 2020-06-03 LAB — LIPID PANEL (REFL)
Cholesterol: 212 mg/dL — ABNORMAL HIGH (ref <200)
HDL: 69 mg/dL (ref >=50)
LDL Cholesterol (Calc): 113 mg/dL (calc) — ABNORMAL HIGH
Non-HDL Cholesterol (Calc): 143 mg/dL (calc) — ABNORMAL HIGH (ref <130)
Total CHOL/HDL Ratio: 3.1 (calc) (ref <5.0)
Triglycerides: 189 mg/dL — ABNORMAL HIGH (ref <150)

## 2020-06-06 ENCOUNTER — Inpatient Hospital Stay: Admission: RE | Admit: 2020-06-06 | Payer: PPO | Source: Ambulatory Visit

## 2020-06-11 DIAGNOSIS — N183 Chronic kidney disease, stage 3 unspecified: Secondary | ICD-10-CM | POA: Diagnosis not present

## 2020-06-11 DIAGNOSIS — E785 Hyperlipidemia, unspecified: Secondary | ICD-10-CM | POA: Diagnosis not present

## 2020-06-11 DIAGNOSIS — E669 Obesity, unspecified: Secondary | ICD-10-CM | POA: Diagnosis not present

## 2020-06-11 DIAGNOSIS — M1710 Unilateral primary osteoarthritis, unspecified knee: Secondary | ICD-10-CM | POA: Diagnosis not present

## 2020-06-11 DIAGNOSIS — Z9181 History of falling: Secondary | ICD-10-CM | POA: Diagnosis not present

## 2020-06-11 DIAGNOSIS — Z87891 Personal history of nicotine dependence: Secondary | ICD-10-CM | POA: Diagnosis not present

## 2020-06-11 DIAGNOSIS — C9110 Chronic lymphocytic leukemia of B-cell type not having achieved remission: Secondary | ICD-10-CM | POA: Diagnosis not present

## 2020-06-11 DIAGNOSIS — D696 Thrombocytopenia, unspecified: Secondary | ICD-10-CM | POA: Diagnosis not present

## 2020-06-11 DIAGNOSIS — I129 Hypertensive chronic kidney disease with stage 1 through stage 4 chronic kidney disease, or unspecified chronic kidney disease: Secondary | ICD-10-CM | POA: Diagnosis not present

## 2020-06-17 ENCOUNTER — Ambulatory Visit (INDEPENDENT_AMBULATORY_CARE_PROVIDER_SITE_OTHER): Payer: PPO

## 2020-06-17 DIAGNOSIS — Z0000 Encounter for general adult medical examination without abnormal findings: Secondary | ICD-10-CM

## 2020-06-17 NOTE — Progress Notes (Signed)
Virtual Visit via Telephone Note  I connected with  Maria Lowe on 06/17/20 at  9:30 AM EDT by telephone and verified that I am speaking with the correct person using two identifiers.  Medicare Annual Wellness visit completed telephonically due to Covid-19 pandemic.   Persons participating in this call: This Health Coach and this patient.   Location: Patient: Home Provider: Office   I discussed the limitations, risks, security and privacy concerns of performing an evaluation and management service by telephone and the availability of in person appointments. The patient expressed understanding and agreed to proceed.  Unable to perform video visit due to video visit attempted and failed and/or patient does not have video capability.   Some vital signs may be absent or patient reported.   Willette Brace, LPN    Subjective:   Maria Lowe is a 86 y.o. female who presents for Medicare Annual (Subsequent) preventive examination.  Review of Systems     Cardiac Risk Factors include: advanced age (>66men, >2 women);dyslipidemia;hypertension;obesity (BMI >30kg/m2)     Objective:    There were no vitals filed for this visit. There is no height or weight on file to calculate BMI.  Advanced Directives 06/17/2020 05/18/2019 04/07/2018 11/28/2015 11/26/2014  Does Patient Have a Medical Advance Directive? No Yes No No No  Type of Advance Directive - Living will;Healthcare Power of Attorney - - -  Does patient want to make changes to medical advance directive? - No - Patient declined - - -  Copy of [place] in Chart? - No - copy requested - - -  Would patient like information on creating a medical advance directive? No - Patient declined - No - Patient declined No - patient declined information No - patient declined information    Current Medications (verified) Outpatient Encounter Medications as of 06/17/2020  Medication Sig  . amLODipine (NORVASC) 10 MG tablet  Take 1 tablet (10 mg total) by mouth daily.  Marland Kitchen atenolol (TENORMIN) 100 MG tablet Take 1 tablet (100 mg total) by mouth daily.  . hydrochlorothiazide (HYDRODIURIL) 25 MG tablet Take 1 tablet (25 mg total) by mouth daily.  Marland Kitchen losartan (COZAAR) 100 MG tablet Take 1 tablet (100 mg total) by mouth daily.   No facility-administered encounter medications on file as of 06/17/2020.    Allergies (verified) Patient has no known allergies.   History: Past Medical History:  Diagnosis Date  . Arthritis    knee  . CLL (chronic lymphocytic leukemia) ([place]) 11/27/2013  . Hypertension   . Ulcer    stomach ulcer when young   Past Surgical History:  Procedure Laterality Date  . APPENDECTOMY    . catarat Right 07/23/2019  . catarat Left 08/31/2019  . TONSILLECTOMY AND ADENOIDECTOMY     Family History  Problem Relation Age of Onset  . Healthy Mother        died age 60 (communist country could not get records)  . Other Father        died young-unclear cause   Social History   Socioeconomic History  . Marital status: Married    Spouse name: Not on file  . Number of children: Not on file  . Years of education: Not on file  . Highest education level: Not on file  Occupational History  . Occupation: retired  Tobacco Use  . Smoking status: Former Smoker    Packs/day: 0.20    Years: 20.00    Pack years: 4.00  Types: Cigarettes    Quit date: 09/21/1978    Years since quitting: 41.7  . Smokeless tobacco: Never Used  Substance and Sexual Activity  . Alcohol use: Yes    Alcohol/week: 7.0 standard drinks    Types: 7 Standard drinks or equivalent per week  . Drug use: No  . Sexual activity: Not on file  Other Topics Concern  . Not on file  Social History Narrative   Widowed April 2016. 3 children. 7 grandkids. 3 greatgrandkids.   Husband worked as Chief Executive Officer.       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 Difficulty of Paying Living Expenses: Not hard at all  Food Insecurity: No Food Insecurity  . Worried About Charity fundraiser in the Last Year: Never true  . Ran Out of Food in the Last Year: Never true  Transportation Needs: No Transportation Needs  . Lack of Transportation (Medical): No  . Lack of Transportation (Non-Medical): No  Physical Activity: Insufficiently Active  . Days of Exercise per Week: 2 days  . Minutes of Exercise per Session: 20 min  Stress: No Stress Concern Present  . Feeling of Stress : Not at all  Social Connections: Socially Isolated  . Frequency of Communication with Friends and Family: More than three times a week  . Frequency of Social Gatherings with Friends and Family: Never  . Attends Religious Services: Never  . Active Member of Clubs or Organizations: No  . Attends Archivist Meetings: Never  . Marital Status: Widowed    Tobacco Counseling Counseling given: Not Answered   Clinical Intake:  Pre-visit preparation completed: Yes  Pain : No/denies pain     BMI - recorded: 31.31 Nutritional Status: BMI > 30  Obese Nutritional Risks: None Diabetes: No  How often do you need to have someone help you when you read instructions, pamphlets, or other written materials from your doctor or pharmacy?: 1 - Never  Diabetic?No  Interpreter Needed?: No  Information entered by :: Charlott Rakes, LPN   Activities of Daily Living In your present state of health, do you have any difficulty performing the following activities: 06/17/2020 05/30/2020  Hearing? N N  Vision? N N  Difficulty concentrating or making decisions? N N  Walking or climbing stairs? N Y  Comment - knee pain  Dressing or bathing? N N  Doing errands, shopping? N Y  Comment - does not Physiological scientist and eating ? N -  Using the Toilet? N -  In the past six months, have you  accidently leaked urine? N -  Do you have problems with loss of bowel control? N -  Managing your Medications? N -  Managing your Finances? N -  Housekeeping or managing your Housekeeping? N -  Some recent data might be hidden    Patient Care Team: [name], MD as PCP - General (Family Medicine) Heath Lark, MD as Consulting Physician (Hematology and Oncology) Madelin Rear, [facility] as Pharmacist (Pharmacist)  Indicate any recent Medical Services you may have received from other than [facility] providers in the past year (date may be approximate).     Assessment:   This is a routine wellness examination for Thea.  Hearing/Vision screen  Hearing Screening   125Hz  250Hz  500Hz  1000Hz  2000Hz  3000Hz   4000Hz  6000Hz  8000Hz   Right ear:           Left ear:           Comments: Pt denies any difficulty at this time  Vision Screening Comments: Follow up with Dr Katy Fitch annually for eye exams  Dietary issues and exercise activities discussed: Current Exercise Habits: The patient does not participate in regular exercise at present (will starty with PT this week)  Goals    . Patient Stated     To continue to stay independent  States she works puzzles     . Patient Stated     None at this time      Depression Screen PHQ 2/9 Scores 06/17/2020 05/30/2020 05/18/2019 05/18/2019 04/07/2018 02/09/2018 06/25/2016  PHQ - 2 Score 0 0 0 0 0 0 0  PHQ- 9 Score - 0 - - - - -    Fall Risk Fall Risk  06/17/2020 05/30/2020 05/18/2019 05/18/2019 04/07/2018  Falls in the past year? 1 1 0 0 No  Number falls in past yr: 1 0 0 0 -  Injury with Fall? 0 0 0 0 -  Risk for fall due to : Impaired balance/gait Impaired balance/gait - - -  Follow up Falls prevention discussed - Education provided - -    Any stairs in or around the home? No  If so, are there any without handrails? No  Home free of loose throw rugs in walkways, pet beds, electrical cords, etc? Yes  Adequate lighting in your home to reduce risk of falls?  Yes   ASSISTIVE DEVICES UTILIZED TO PREVENT FALLS:  Life alert? No  Use of a cane, walker or w/c? No  Grab bars in the bathroom? No  Shower chair or bench in shower? No  Elevated toilet seat or a handicapped toilet? No   TIMED UP AND GO:  Was the test performed? No .   Cognitive Function: MMSE - Mini Mental State Exam 04/07/2018  Not completed: (No Data)     6CIT Screen 06/17/2020 11/23/2019 05/18/2019  What Year? 0 points 0 points 0 points  What month? 0 points - 0 points  What time? - - 0 points  Count back from 20 0 points - 0 points  Months in reverse 0 points - 0 points  Repeat phrase 6 points - 0 points  Total Score - - 0    Immunizations Immunization History  Administered Date(s) Administered  . Influenza Whole 06/30/2013  . Influenza, High Dose Seasonal PF 06/25/2016, 06/21/2019  . Influenza,inj,Quad PF,6+ Mos 06/13/2014  . Influenza-Unspecified 07/12/2015, 06/09/2017  . PFIZER SARS-COV-2 Vaccination 12/13/2019, 12/30/2019  . Pneumococcal Conjugate-13 06/25/2016  . Pneumococcal Polysaccharide-23 06/21/2013  . Td 04/07/2018    TDAP status: Up to date Flu Vaccine status: Up to date Pneumococcal vaccine status: Up to date Covid-19 vaccine status: Completed vaccines  Qualifies for Shingles Vaccine? Yes   Zostavax completed No   Shingrix Completed?: No.    Education has been provided regarding the importance of this vaccine. Patient has been advised to call insurance company to determine out of pocket expense if they have not yet received this vaccine. Advised may also receive vaccine at local pharmacy or Health Dept. Verbalized acceptance and understanding.  Screening Tests Health Maintenance  Topic Date Due  . INFLUENZA VACCINE  12/19/2020 (Originally 04/21/2020)  . DEXA SCAN  10/23/2043 (Originally 06/21/1998)  . TETANUS/TDAP  04/07/2028  . COVID-19 Vaccine  Completed  . PNA vac Low Risk Adult  Completed    Health Maintenance  There are no preventive care  reminders to display for this patient.  Colorectal cancer screening: No longer required.  Mammogram status: No longer required.  Bone Density status: Ordered 05/30/20 pt cancelled and will reschedule. Pt provided with contact info and advised to call to schedule appt.    Additional Screening:  Vision Screening: Recommended annual ophthalmology exams for early detection of glaucoma and other disorders of the eye. Is the patient up to date with their annual eye exam?  Yes  Who is the provider or what is the name of the office in which the patient attends annual eye exams? Dr Katy Fitch   Dental Screening: Recommended annual dental exams for proper oral hygiene  Community Resource Referral / Chronic Care Management: CRR required this visit?  No   CCM required this visit?  No      Plan:     I have personally reviewed and noted the following in the patient's chart:   . Medical and social history . Use of alcohol, tobacco or illicit drugs  . Current medications and supplements . Functional ability and status . Nutritional status . Physical activity . Advanced directives . List of other physicians . Hospitalizations, surgeries, and ER visits in previous 12 months . Vitals . Screenings to include cognitive, depression, and falls . Referrals and appointments  In addition, I have reviewed and discussed with patient certain preventive protocols, quality metrics, and best practice recommendations. A written personalized care plan for preventive services as well as general preventive health recommendations were provided to patient.     Willette Brace, LPN   [id number]   Nurse Notes: None

## 2020-06-17 NOTE — Patient Instructions (Addendum)
[name] , Thank you for taking time to come for your Medicare Wellness Visit. I appreciate your ongoing commitment to your health goals. Please review the following plan we discussed and let me know if I can assist you in the future.   Screening recommendations/referrals: Colonoscopy: No longer required Mammogram: No longer required Bone Density: Pt cancelled and stated will reschedule Recommended yearly ophthalmology/optometry visit for glaucoma screening and checkup Recommended yearly dental visit for hygiene and checkup  Vaccinations: Influenza vaccine: done 06/21/19 Pt made aware that flu shot is Due and discussed  Pneumococcal vaccine: Up to date Tdap vaccine: Up to date Shingles vaccine: Shingrix discussed. Please contact your pharmacy for coverage information.   Covid-19:Completed 3/24 & 12/30/19  Advanced directives: Advance directive discussed with you today. Even though you declined this today please call our office should you change your mind and we can give you the proper paperwork for you to fill out.  Conditions/risks identified: None at this time  Next appointment: Follow up in one year for your annual wellness visit    Preventive Care 65 Years and Older, Female Preventive care refers to lifestyle choices and visits with your health care provider that can promote health and wellness. What does preventive care include?  A yearly physical exam. This is also called an annual well check.  Dental exams once or twice a year.  Routine eye exams. Ask your health care provider how often you should have your eyes checked.  Personal lifestyle choices, including:  Daily care of your teeth and gums.  Regular physical activity.  Eating a healthy diet.  Avoiding tobacco and drug use.  Limiting alcohol use.  Practicing safe sex.  Taking low-dose aspirin every day.  Taking vitamin and mineral supplements as recommended by your health care provider. What happens  during an annual well check? The services and screenings done by your health care provider during your annual well check will depend on your age, overall health, lifestyle risk factors, and family history of disease. Counseling  Your health care provider may ask you questions about your:  Alcohol use.  Tobacco use.  Drug use.  Emotional well-being.  Home and relationship well-being.  Sexual activity.  Eating habits.  History of falls.  Memory and ability to understand (cognition).  Work and work Statistician.  Reproductive health. Screening  You may have the following tests or measurements:  Height, weight, and BMI.  Blood pressure.  Lipid and cholesterol levels. These may be checked every 5 years, or more frequently if you are over 30 years old.  Skin check.  Lung cancer screening. You may have this screening every year starting at age 76 if you have a 30-pack-year history of smoking and currently smoke or have quit within the past 15 years.  Fecal occult blood test (FOBT) of the stool. You may have this test every year starting at age 30.  Flexible sigmoidoscopy or colonoscopy. You may have a sigmoidoscopy every 5 years or a colonoscopy every 10 years starting at age 33.  Hepatitis C blood test.  Hepatitis B blood test.  Sexually transmitted disease (STD) testing.  Diabetes screening. This is done by checking your blood sugar (glucose) after you have not eaten for a while (fasting). You may have this done every 1-3 years.  Bone density scan. This is done to screen for osteoporosis. You may have this done starting at age 70.  Mammogram. This may be done every 1-2 years. Talk to your health care provider  about how often you should have regular mammograms. Talk with your health care provider about your test results, treatment options, and if necessary, the need for more tests. Vaccines  Your health care provider may recommend certain vaccines, such  as:  Influenza vaccine. This is recommended every year.  Tetanus, diphtheria, and acellular pertussis (Tdap, Td) vaccine. You may need a Td booster every 10 years.  Zoster vaccine. You may need this after age 69.  Pneumococcal 13-valent conjugate (PCV13) vaccine. One dose is recommended after age 55.  Pneumococcal polysaccharide (PPSV23) vaccine. One dose is recommended after age 55. Talk to your health care provider about which screenings and vaccines you need and how often you need them. This information is not intended to replace advice given to you by your health care provider. Make sure you discuss any questions you have with your health care provider. Document Released: 10/04/2015 Document Revised: 05/27/2016 Document Reviewed: 07/09/2015 Elsevier Interactive Patient Education  2017 [place] Prevention in the Home Falls can cause injuries. They can happen to people of all ages. There are many things you can do to make your home safe and to help prevent falls. What can I do on the outside of my home?  Regularly fix the edges of walkways and driveways and fix any cracks.  Remove anything that might make you trip as you walk through a door, such as a raised step or threshold.  Trim any bushes or trees on the path to your home.  Use bright outdoor lighting.  Clear any walking paths of anything that might make someone trip, such as rocks or tools.  Regularly check to see if handrails are loose or broken. Make sure that both sides of any steps have handrails.  Any raised decks and porches should have guardrails on the edges.  Have any leaves, snow, or ice cleared regularly.  Use sand or salt on walking paths during winter.  Clean up any spills in your garage right away. This includes oil or grease spills. What can I do in the bathroom?  Use night lights.  Install grab bars by the toilet and in the tub and shower. Do not use towel bars as grab bars.  Use  non-skid mats or decals in the tub or shower.  If you need to sit down in the shower, use a plastic, non-slip stool.  Keep the floor dry. Clean up any water that spills on the floor as soon as it happens.  Remove soap buildup in the tub or shower regularly.  Attach bath mats securely with double-sided non-slip rug tape.  Do not have throw rugs and other things on the floor that can make you trip. What can I do in the bedroom?  Use night lights.  Make sure that you have a light by your bed that is easy to reach.  Do not use any sheets or blankets that are too big for your bed. They should not hang down onto the floor.  Have a firm chair that has side arms. You can use this for support while you get dressed.  Do not have throw rugs and other things on the floor that can make you trip. What can I do in the kitchen?  Clean up any spills right away.  Avoid walking on wet floors.  Keep items that you use a lot in easy-to-reach places.  If you need to reach something above you, use a strong step stool that has a grab bar.  Keep electrical cords out of the way.  Do not use floor polish or wax that makes floors slippery. If you must use wax, use non-skid floor wax.  Do not have throw rugs and other things on the floor that can make you trip. What can I do with my stairs?  Do not leave any items on the stairs.  Make sure that there are handrails on both sides of the stairs and use them. Fix handrails that are broken or loose. Make sure that handrails are as long as the stairways.  Check any carpeting to make sure that it is firmly attached to the stairs. Fix any carpet that is loose or worn.  Avoid having throw rugs at the top or bottom of the stairs. If you do have throw rugs, attach them to the floor with carpet tape.  Make sure that you have a light switch at the top of the stairs and the bottom of the stairs. If you do not have them, ask someone to add them for you. What  else can I do to help prevent falls?  Wear shoes that:  Do not have high heels.  Have rubber bottoms.  Are comfortable and fit you well.  Are closed at the toe. Do not wear sandals.  If you use a stepladder:  Make sure that it is fully opened. Do not climb a closed stepladder.  Make sure that both sides of the stepladder are locked into place.  Ask someone to hold it for you, if possible.  Clearly mark and make sure that you can see:  Any grab bars or handrails.  First and last steps.  Where the edge of each step is.  Use tools that help you move around (mobility aids) if they are needed. These include:  Canes.  Walkers.  Scooters.  Crutches.  Turn on the lights when you go into a dark area. Replace any light bulbs as soon as they burn out.  Set up your furniture so you have a clear path. Avoid moving your furniture around.  If any of your floors are uneven, fix them.  If there are any pets around you, be aware of where they are.  Review your medicines with your doctor. Some medicines can make you feel dizzy. This can increase your chance of falling. Ask your doctor what other things that you can do to help prevent falls. This information is not intended to replace advice given to you by your health care provider. Make sure you discuss any questions you have with your health care provider. Document Released: 07/04/2009 Document Revised: 02/13/2016 Document Reviewed: 10/12/2014 Elsevier Interactive Patient Education  2017 [employer].

## 2020-06-21 DIAGNOSIS — I129 Hypertensive chronic kidney disease with stage 1 through stage 4 chronic kidney disease, or unspecified chronic kidney disease: Secondary | ICD-10-CM | POA: Diagnosis not present

## 2020-06-21 DIAGNOSIS — E669 Obesity, unspecified: Secondary | ICD-10-CM | POA: Diagnosis not present

## 2020-06-21 DIAGNOSIS — D696 Thrombocytopenia, unspecified: Secondary | ICD-10-CM | POA: Diagnosis not present

## 2020-06-21 DIAGNOSIS — N183 Chronic kidney disease, stage 3 unspecified: Secondary | ICD-10-CM | POA: Diagnosis not present

## 2020-06-21 DIAGNOSIS — E785 Hyperlipidemia, unspecified: Secondary | ICD-10-CM | POA: Diagnosis not present

## 2020-06-21 DIAGNOSIS — Z87891 Personal history of nicotine dependence: Secondary | ICD-10-CM | POA: Diagnosis not present

## 2020-06-21 DIAGNOSIS — C9110 Chronic lymphocytic leukemia of B-cell type not having achieved remission: Secondary | ICD-10-CM | POA: Diagnosis not present

## 2020-06-21 DIAGNOSIS — M1710 Unilateral primary osteoarthritis, unspecified knee: Secondary | ICD-10-CM | POA: Diagnosis not present

## 2020-06-21 DIAGNOSIS — Z9181 History of falling: Secondary | ICD-10-CM | POA: Diagnosis not present

## 2020-06-27 ENCOUNTER — Telehealth: Payer: Self-pay

## 2020-06-27 NOTE — Progress Notes (Signed)
° ° °  Chronic Care Management Pharmacy Assistant   Name: Maria Lowe  MRN: [id number] DOB: 01-Aug-1931  Reason for Encounter:Medication Review / Initial Visit  Patient Questions:  1.  Have you seen any other providers since your last visit? No  2.  Any changes in your medicines or health? No   Maria Lowe,  86 y.o. , female presents for their Initial CCM visit with the clinical pharmacist via telephone.  PCP : [name], MD  Allergies:  No Known Allergies  Medications: Outpatient Encounter Medications as of 06/27/2020  Medication Sig   amLODipine (NORVASC) 10 MG tablet Take 1 tablet (10 mg total) by mouth daily.   atenolol (TENORMIN) 100 MG tablet Take 1 tablet (100 mg total) by mouth daily.   hydrochlorothiazide (HYDRODIURIL) 25 MG tablet Take 1 tablet (25 mg total) by mouth daily.   losartan (COZAAR) 100 MG tablet Take 1 tablet (100 mg total) by mouth daily.   No facility-administered encounter medications on file as of 06/27/2020.    Current Diagnosis: Patient Active Problem List   Diagnosis Date Noted   Hyperlipidemia, unspecified 04/07/2018   CKD (chronic kidney disease), stage III ([place]) 02/09/2018   Rash 02/09/2018   Seborrheic dermatitis 01/08/2017   Former smoker 09/30/2015   Uterine prolapse 03/29/2015   Thrombocytopenia ([place]) 11/26/2014   CLL (chronic lymphocytic leukemia) ([place]) 11/27/2013   Hyperglycemia 05/08/2013   Essential hypertension, benign 11/07/2012   Obesity, unspecified 11/07/2012     Have you seen any other providers since your last visit? Patient states she has not seen any other providers. Any changes in your medications or health? Patient states she has no changes in medications or health.  Any side effects from any medications? Patient states she is not having any side effects from any medications. Do you have an symptoms or problems not managed by your medications? Patient states she has no symptoms or problems  managed by medications at this time. Any concerns about your health right now? Patient states she has no concerns at this moment Has your provider asked that you check blood pressure, blood sugar, or follow special diet at home? Yes , Patient states she checks her BP twice a week . Patient states she does not follow a special diet. Do you get any type of exercise on a regular basis? Yes, Patient states she exercises twice a week. Can you think of a goal you would like to reach for your health? Patient states not at this moment. Do you have any problems getting your medications? Patient states she has no problems getting medications. Is there anything that you would like to discuss during the appointment? Patient states  Not that she can think of at this moment.  Please bring medications and supplements to appointment   Georgiana Shore ,[facility] Clinical Pharmacist Assistant [phone]   Follow-Up:  Pharmacist Review

## 2020-07-02 ENCOUNTER — Other Ambulatory Visit: Payer: Self-pay

## 2020-07-02 ENCOUNTER — Ambulatory Visit: Payer: PPO

## 2020-07-02 ENCOUNTER — Telehealth: Payer: Self-pay

## 2020-07-02 DIAGNOSIS — I10 Essential (primary) hypertension: Secondary | ICD-10-CM

## 2020-07-02 DIAGNOSIS — R739 Hyperglycemia, unspecified: Secondary | ICD-10-CM

## 2020-07-02 NOTE — Progress Notes (Signed)
error 

## 2020-07-02 NOTE — Progress Notes (Deleted)
Chronic Care Management Pharmacy  Name: Maria Lowe  MRN: [id number] DOB: 1931/04/10  Chief Complaint/ HPI  Maria Lowe,  86 y.o., female presents for their Initial CCM visit with the clinical pharmacist via telephone due to COVID-19 Pandemic.  PCP : [name], MD  Chronic conditions include:  No diagnosis found.  Office Visits:  05/30/2020 (PCP): gait/balance issues, referred to home health PT.   Consult Visit: 02/27/2020 (Dr Elson Areas): CLL - no disease progression noted. Plan to see back in 12 months. Recommend annual flu vaccine.   Patient Active Problem List   Diagnosis Date Noted  . Hyperlipidemia, unspecified 04/07/2018  . CKD (chronic kidney disease), stage III ([place]) 02/09/2018  . Rash 02/09/2018  . Seborrheic dermatitis 01/08/2017  . Former smoker 09/30/2015  . Uterine prolapse 03/29/2015  . Thrombocytopenia ([place]) 11/26/2014  . CLL (chronic lymphocytic leukemia) ([place]) 11/27/2013  . Hyperglycemia 05/08/2013  . Essential hypertension, benign 11/07/2012  . Obesity, unspecified 11/07/2012   Past Surgical History:  Procedure Laterality Date  . APPENDECTOMY    . catarat Right 07/23/2019  . catarat Left 08/31/2019  . TONSILLECTOMY AND ADENOIDECTOMY     Family History  Problem Relation Age of Onset  . Healthy Mother        died age 86 (communist [place] could not get records)  . Other Father        died young-unclear cause   No Known Allergies Outpatient Encounter Medications as of 07/03/2020  Medication Sig  . amLODipine (NORVASC) 10 MG tablet Take 1 tablet (10 mg total) by mouth daily.  Marland Kitchen atenolol (TENORMIN) 100 MG tablet Take 1 tablet (100 mg total) by mouth daily.  . hydrochlorothiazide (HYDRODIURIL) 25 MG tablet Take 1 tablet (25 mg total) by mouth daily.  Marland Kitchen losartan (COZAAR) 100 MG tablet Take 1 tablet (100 mg total) by mouth daily.   No facility-administered encounter medications on file as of 07/03/2020.   Patient Care Team     Relationship Specialty Notifications Start End  [name], MD PCP - General Family Medicine  09/30/15   Heath Lark, MD Consulting Physician Hematology and Oncology  11/27/13   Madelin Rear, [facility] Pharmacist Pharmacist  05/01/20    Comment: [phone]   Current Diagnosis/Assessment: Goals Addressed   None    Hypertension   BP goal <130/80  BP Readings from Last 3 Encounters:  05/30/20 126/62  02/27/20 140/61  02/09/20 128/74   Patient has failed these meds in the past: n/a. Patient checks BP at home {CHL HP BP Monitoring [phone]} Patient home BP readings are ranging: ***  Patient is currently {CHL Controlled/Uncontrolled:[phone]} on the following medications:  . Amlodipine 10 mg once daily . Atenolol 100 mg once daily  . Hydrochlorothiazide 25 mg once daily . Losartan 100 mg once daily  We discussed {CHL HP Upstream Pharmacy discussion:[phone]}.  Plan  Continue {CHL HP Upstream Pharmacy Plans:[id number]}.   Hyperglycemia    A1c goal < ***%  Lab Results  Component Value Date/Time   HGBA1C 5.5 05/30/2020 02:19 PM   HGBA1C 5.8 11/23/2019 01:11 PM   EGFR 54 (L) 11/28/2015 09:59 AM    Checking BG: {CHL HP Blood Glucose Monitoring Frequency:[phone]}. Recent FBG readings ***  Previous medications: Patient is currently controlled on the following medications:  . n/a  We discussed: {CHL HP Upstream Pharmacy discussion:[phone]}.  Plan  Continue {CHL HP Upstream Pharmacy Plans:6676029698}.  Hyperlipidemia   Lipid Panel     Component Value Date/Time   CHOL  212 (H) 05/30/2020 1419   TRIG 189 (H) 05/30/2020 1419   HDL 69 05/30/2020 1419   LDLCALC 113 (H) 05/30/2020 1419    Hepatic Function Latest Ref Rng & Units 05/30/2020 05/18/2019 04/07/2018  Total Protein 6.1 - 8.1 g/dL 6.3 6.4 6.7  Albumin 3.5 - 5.2 g/dL - 4.6 4.7  AST 10 - 35 U/L '19 20 17  ' ALT 6 - 29 U/L '13 12 13  ' Alk Phosphatase 39 - 117 U/L - 69 68  Total Bilirubin 0.2 - 1.2  mg/dL 0.6 0.5 0.9  Bilirubin, Direct 0.0 - 0.3 mg/dL - - -    The ASCVD Risk score ([place]., et al., 2013) failed to calculate for the following reasons:   The 2013 ASCVD risk score is only valid for ages 49 to 54   Primary prevention. LDL 113 on 05/2020, 86 y.o., no DM. Patient has failed these meds in past:  No medications at present.   We discussed:  diet and exercise extensively - see htn.  Plan  Continue control with diet and exercise.  Osteopenia / Osteoporosis   Last DEXA Scan: ***   No results found for: VD25OH   Patient {is;is not an osteoporosis candidate:23886}  Patient has failed these meds in past: *** Patient is currently {CHL Controlled/Uncontrolled:[phone]} on the following medications:  . ***  We discussed:  {Osteoporosis Counseling:23892}.  Plan  Continue {CHL HP Upstream Pharmacy Plans:[phone]}.  ***   Patient has failed these meds in past: *** Patient is currently {CHL Controlled/Uncontrolled:[phone]} on the following medications:  . ***  We discussed:  ***  Plan  Continue {CHL HP Upstream Pharmacy Plans:[phone]}.  Vaccines   Immunization History  Administered Date(s) Administered  . Influenza Whole 06/30/2013  . Influenza, High Dose Seasonal PF 06/25/2016, 06/21/2019  . Influenza,inj,Quad PF,6+ Mos 06/13/2014  . Influenza-Unspecified 07/12/2015, 06/09/2017  . PFIZER SARS-COV-2 Vaccination 12/13/2019, 12/30/2019  . Pneumococcal Conjugate-13 06/25/2016  . Pneumococcal Polysaccharide-23 06/21/2013  . Td 04/07/2018    Reviewed and discussed patient's vaccination history.  Due for pfizer covid vaccine booster.   Plan  Recommended patient receive *** vaccine in ***.   Medication Management Coordination   Re[facility] medications from:  [facility] [place] [place] [facility] [address] [place] [address] Phone: [phone] Fax: [phone]  [id number] [place]  [place] [address] [place]Phone: [phone]: [phone]  ***  Plan  {US Pharmacy PPTWS:56812} ___________________________ SDOH (Social Determinants of Health) assessments performed: Yes.  Future Appointments  Date Time Provider [place] 07/03/2020  1:00 PM [facility] CCM PHARMACIST [facility]  11/29/2020  9:40 AM HMarin Olp MD [facility] PEC  02/25/2021  9:00 AM [facility] LAB [facility] None  02/25/2021  9:30 AM GHeath Lark MD [facility] None  06/20/2021  9:30 AM [facility] HEALTH COACH [facility]   Visit follow-up:  . CPA follow-up: ***. .Marland KitchenRPH follow-up: *** month *** visit.  JMadelin Rear Pharm.D., BCGP Clinical Pharmacist [facility] [phone]

## 2020-07-02 NOTE — Chronic Care Management (AMB) (Signed)
°  Chronic Care Management   Outreach Note  07/02/2020 Name: Suella Cogar Mader MRN: [id number] DOB: 23-Dec-1930  Referred by: [name], MD   Reason for referral: telephone visit [facility] clinical pharmacist, Madelin Rear.   Spoke with patient, unable to make appointment time of 07/02/2020 at 11am - rescheduled for 10/13 at [place], Pharm.D., BCGP Clinical Pharmacist [facility] [phone]

## 2020-07-02 NOTE — Progress Notes (Deleted)
Chronic Care Management Pharmacy  Name: Maria Lowe  MRN: [id number] DOB: [birth year]  Chief Complaint/ HPI  Maria Lowe,  86 y.o., female presents for their Initial CCM visit with the clinical pharmacist via telephone due to COVID-19 Pandemic.  PCP : [name], MD  Chronic conditions include:  No diagnosis found.  Office Visits:  05/30/2020 (PCP): gait/balance issues, referred to home health PT.   Consult Visit: 02/27/2020 (Dr Elson Areas): CLL - no disease progression noted. Plan to see back in 12 months. Recommend annual flu vaccine.   Patient Active Problem List   Diagnosis Date Noted  . Hyperlipidemia, unspecified 04/07/2018  . CKD (chronic kidney disease), stage III ([place]) 02/09/2018  . Rash 02/09/2018  . Seborrheic dermatitis 01/08/2017  . Former smoker 09/30/2015  . Uterine prolapse 03/29/2015  . Thrombocytopenia ([place]) 11/26/2014  . CLL (chronic lymphocytic leukemia) ([place]) 11/27/2013  . Hyperglycemia 05/08/2013  . Essential hypertension, benign 11/07/2012  . Obesity, unspecified 11/07/2012   Past Surgical History:  Procedure Laterality Date  . APPENDECTOMY    . catarat Right 07/23/2019  . catarat Left 08/31/2019  . TONSILLECTOMY AND ADENOIDECTOMY     Family History  Problem Relation Age of Onset  . Healthy Mother        died age 37 (communist country could not get records)  . Other Father        died young-unclear cause   No Known Allergies Outpatient Encounter Medications as of 07/02/2020  Medication Sig  . amLODipine (NORVASC) 10 MG tablet Take 1 tablet (10 mg total) by mouth daily.  Marland Kitchen atenolol (TENORMIN) 100 MG tablet Take 1 tablet (100 mg total) by mouth daily.  . hydrochlorothiazide (HYDRODIURIL) 25 MG tablet Take 1 tablet (25 mg total) by mouth daily.  Marland Kitchen losartan (COZAAR) 100 MG tablet Take 1 tablet (100 mg total) by mouth daily.   No facility-administered encounter medications on file as of 07/02/2020.   Patient Care Team     Relationship Specialty Notifications Start End  [name], MD PCP - General Family Medicine  09/30/15   Heath Lark, MD Consulting Physician Hematology and Oncology  11/27/13   Madelin Rear, [facility] Pharmacist Pharmacist  05/01/20    Comment: [phone]   Current Diagnosis/Assessment: Goals Addressed   None    Hypertension   BP goal <130/80  BP Readings from Last 3 Encounters:  05/30/20 126/62  02/27/20 140/61  02/09/20 128/74   Patient has failed these meds in the past: n/a. Patient checks BP at home {CHL HP BP Monitoring Frequency:[phone]} Patient home BP readings are ranging: ***  Patient is currently {CHL Controlled/Uncontrolled:7026505951} on the following medications:  . Amlodipine 10 mg once daily . Atenolol 100 mg once daily  . Hydrochlorothiazide 25 mg once daily . Losartan 100 mg once daily  We discussed {CHL HP Upstream Pharmacy discussion:[phone]}.  Plan  Continue {CHL HP Upstream Pharmacy Plans:[phone]}.   Hyperglycemia    A1c goal < ***%  Lab Results  Component Value Date/Time   HGBA1C 5.5 05/30/2020 02:19 PM   HGBA1C 5.8 11/23/2019 01:11 PM   EGFR 54 (L) 11/28/2015 09:59 AM    Checking BG: {CHL HP Blood Glucose Monitoring [phone]}. Recent FBG readings ***  Previous medications: Patient is currently controlled on the following medications:  . n/a  We discussed: {CHL HP Upstream Pharmacy discussion:[phone]}.  Plan  Continue {CHL HP Upstream Pharmacy Plans:[phone]}.  Hyperlipidemia   Lipid Panel     Component Value Date/Time   CHOL  212 (H) 05/30/2020 1419   TRIG 189 (H) 05/30/2020 1419   HDL 69 05/30/2020 1419   LDLCALC 113 (H) 05/30/2020 1419    Hepatic Function Latest Ref Rng & Units 05/30/2020 05/18/2019 04/07/2018  Total Protein 6.1 - 8.1 g/dL 6.3 6.4 6.7  Albumin 3.5 - 5.2 g/dL - 4.6 4.7  AST 10 - 35 U/L _0 ALT 6 - 29 U/L _1 Alk Phosphatase 39 - 117 U/L - 69 68  Total Bilirubin 0.2 - 1.2  mg/dL 0.6 0.5 0.9  Bilirubin, Direct 0.0 - 0.3 mg/dL - - -    The ASCVD Risk score ([place]., et al., 2013) failed to calculate for the following reasons:   The 2013 ASCVD risk score is only valid for ages 86 to 12   Primary prevention. LDL 113 on 05/2020, 86 y.o., no DM. Patient has failed these meds in past:  No medications at present.   We discussed:  diet and exercise extensively - see htn.  Plan  Continue control with diet and exercise.  Osteopenia / Osteoporosis   Last DEXA Scan: ***   No results found for: VD25OH   Patient {is;is not an osteoporosis candidate:23886}  Patient has failed these meds in past: *** Patient is currently {CHL Controlled/Uncontrolled:3066393310} on the following medications:  . ***  We discussed:  {Osteoporosis Counseling:23892}.  Plan  Continue {CHL HP Upstream Pharmacy Plans:[phone]}.  ***   Patient has failed these meds in past: *** Patient is currently {CHL Controlled/Uncontrolled:3066393310} on the following medications:  . ***  We discussed:  ***  Plan  Continue {CHL HP Upstream Pharmacy Plans:[phone]}.  Vaccines   Immunization History  Administered Date(s) Administered  . Influenza Whole 06/30/2013  . Influenza, High Dose Seasonal PF 06/25/2016, 06/21/2019  . Influenza,inj,Quad PF,6+ Mos 06/13/2014  . Influenza-Unspecified 07/12/2015, 06/09/2017  . PFIZER SARS-COV-2 Vaccination 12/13/2019, 12/30/2019  . Pneumococcal Conjugate-13 06/25/2016  . Pneumococcal Polysaccharide-23 06/21/2013  . Td 04/07/2018    Reviewed and discussed patient's vaccination history.  Due for pfizer covid vaccine booster.   Plan  Recommended patient receive *** vaccine in ***.   Medication Management Coordination   Re[facility]ives [facility] medications from:  [facility] [place], [place] - [address] [place] [address] Phone: [phone] Fax: [phone]  [id number] [place],  [place] [address] [place]Phone: [phone]: [phone]  ***  Plan  {US Pharmacy PIRCV:89381} ___________________________ SDOH (Social Determinants of Health) assessments performed: Yes.  Future Appointments  Date Time Provider [place] 07/02/2020 11:00 AM [facility] CCM PHARMACIST [facility]  11/29/2020  9:40 AM HMarin Olp MD [facility] PEC  02/25/2021  9:00 AM [facility] LAB [facility] None  02/25/2021  9:30 AM GHeath Lark MD [facility] None  06/20/2021  9:30 AM [facility] HEALTH COACH [facility]   Visit follow-up:  . CPA follow-up: ***. .Marland KitchenRPH follow-up: *** month *** visit.  JMadelin Rear Pharm.D., BCGP Clinical Pharmacist [facility] [phone]

## 2020-07-03 ENCOUNTER — Ambulatory Visit: Payer: PPO

## 2020-07-03 NOTE — Chronic Care Management (AMB) (Addendum)
°  Chronic Care Management   Outreach Note   Name: Maria Lowe MRN: [id number] DOB: 04/20/1931  Referred by: [name], MD Reason for referral: Telephone Appointment with [facility] Pharmacist, Madelin Rear.   An unsuccessful telephone outreach was attempted today. The patient was referred to the pharmacist for assistance with care management and care coordination.   Telephone appointment with clinical pharmacist today (07/03/2020) at 1pm. If patient immediately returns call, transfer to [phone]. Otherwise, please provide this number so patient can reschedule visit.   Madelin Rear, Pharm.D., BCGP Clinical Pharmacist [facility] [phone]

## 2020-07-05 ENCOUNTER — Telehealth: Payer: Self-pay | Admitting: Family Medicine

## 2020-07-05 NOTE — Progress Notes (Signed)
  Chronic Care Management   Note  07/05/2020 Name: Maria Lowe MRN: [id number] DOB: April 16, 1931  Maria Lowe is a 86 y.o. year old female who is a primary care patient of Hunter, Brayton Mars, MD. I reached out to [place] by phone today in response to a referral sent by Maria Lowe's PCP, [name], MD.   Maria Lowe was given information about Chronic Care Management services today including:  1. CCM service includes personalized support from designated clinical staff supervised by her physician, including individualized plan of care and coordination with other care providers 2. 24/7 contact phone numbers for assistance for urgent and routine care needs. 3. Service will only be billed when office clinical staff spend 20 minutes or more in a month to coordinate care. 4. Only one practitioner may furnish and bill the service in a calendar month. 5. The patient may stop CCM services at any time (effective at the end of the month) by phone call to the office staff.   Patient agreed to services and verbal consent obtained.   Follow up plan:   Lauretta Grill Upstream Scheduler

## 2020-07-31 ENCOUNTER — Other Ambulatory Visit: Payer: Self-pay

## 2020-07-31 NOTE — Telephone Encounter (Signed)
Pt states son went to [facility] to medication and the pharmacy knew nothing about it. Has this been sent in? I do not see anything in her chart.

## 2020-07-31 NOTE — Telephone Encounter (Signed)
Great Tax adviser work! I am fine with a prednisone refill from May-she fails to improve we need to get her into sports medicine or if she has recurrent issues

## 2020-07-31 NOTE — Telephone Encounter (Signed)
Pt is requesting a medication refill on the medicine that Dr. Yong Channel prescribed for pain in her neck at the beginning of summer. Pt does not remember name of medication.

## 2020-07-31 NOTE — Telephone Encounter (Signed)
From the visit on 05/21 it looks like only prednisone was sent in but nothing for pain,pt does not know the name of the pain medication.

## 2020-07-31 NOTE — Telephone Encounter (Signed)
Called to make pt aware.

## 2020-07-31 NOTE — Telephone Encounter (Signed)
Patient is calling requesting this to be sent over to the pharmacy as soon as possible predisone is what she is asking for she thinks  [facility] [place], [place] - [address]

## 2020-08-01 MED ORDER — PREDNISONE 50 MG PO TABS: 50.0000 mg | ORAL_TABLET | Freq: Every day | ORAL | 0 refills | Status: DC

## 2020-08-01 NOTE — Addendum Note (Signed)
Addended by: Thomes Cake on: 08/01/2020 07:53 AM   Modules accepted: Orders

## 2020-09-02 ENCOUNTER — Telehealth: Payer: Self-pay

## 2020-09-02 NOTE — Progress Notes (Signed)
    Chronic Care Management Pharmacy Assistant   Name: Maria Lowe  MRN: [id number] DOB: 06-15-1931  Reason for Encounter: Medication Review-Initial Visit  [place],  86 y.o. , female presents for their Initial CCM visit with the clinical pharmacist via telephone.  PCP : Maria Olp, MD  Allergies:  No Known Allergies  Medications: Outpatient Encounter Medications as of 09/02/2020  Medication Sig  . amLODipine (NORVASC) 10 MG tablet Take 1 tablet (10 mg total) by mouth daily.  Marland Kitchen atenolol (TENORMIN) 100 MG tablet Take 1 tablet (100 mg total) by mouth daily.  . hydrochlorothiazide (HYDRODIURIL) 25 MG tablet Take 1 tablet (25 mg total) by mouth daily.  Marland Kitchen losartan (COZAAR) 100 MG tablet Take 1 tablet (100 mg total) by mouth daily.  . predniSONE (DELTASONE) 50 MG tablet Take 1 tablet (50 mg total) by mouth daily with breakfast.   No facility-administered encounter medications on file as of 09/02/2020.    Current Diagnosis: Patient Active Problem List   Diagnosis Date Noted  . Hyperlipidemia, unspecified 04/07/2018  . CKD (chronic kidney disease), stage III ([place]) 02/09/2018  . Rash 02/09/2018  . Seborrheic dermatitis 01/08/2017  . Former smoker 09/30/2015  . Uterine prolapse 03/29/2015  . Thrombocytopenia ([place]) 11/26/2014  . CLL (chronic lymphocytic leukemia) ([place]) 11/27/2013  . Hyperglycemia 05/08/2013  . Essential hypertension, benign 11/07/2012  . Obesity, unspecified 11/07/2012   Have you seen any other providers since your last visit?            Patient stated she has not had any appointments.  Any changes in your medications or health?            Patient stated she has had no changes.  Any side effects from any medications?             Patient states she has not side effects from medications.  Do you have an symptoms or problems not managed by your medications?             Patient stated she as no current  problems or symptoms that are not  managed.  Any concerns about your health right now?         Patient stated she has not concerns at this time.  Has your provider asked that you check blood pressure, blood sugar, or follow special diet at home?         Yes, she is currently checking her blood pressure, her  current readings are 128/70 and 119/70. Patient stated tries to eat right.  Do you get any type of exercise on a regular basis?        Patient stated she follows exercise from PT.   Can you think of a goal you would like to reach for your health?         Patient stated she did not have any goals at this moment.  Do you have any problems getting your medications?           Patient stated she does not have issues getting her medications.  Is there anything that you would like to discuss during the appointment?            Patient stated not at this moment.  Please bring medications and supplements to appointment  Maria Lowe ,[facility] Clinical Pharmacist Assistant [phone]   Follow-Up:  Pharmacist Review

## 2020-09-02 NOTE — Progress Notes (Signed)
Chronic Care Management Pharmacy Name: [facility]     MRN: [id number]     DOB: 07-11-1931  Chief Complaint/ HPI Maria Lowe, 86 y.o., female, presents for their initial CCM visit with the clinical pharmacist via telephone due to COVID-19 pandemic.  PCP : [name], MD Encounter Diagnoses  Name Primary?  . Hyperglycemia Yes  . Essential hypertension, benign   . Hyperlipidemia, unspecified hyperlipidemia type     Office Visits:  05/30/2020 (PCP): gait/balance issues, referred to home health PT. Continue f/u with hematology. Cholesterol high but no med due age, focus on healthy eating/regular exercise.   Consult Visit: 02/27/2020 (Dr Elson Areas): CLL - no disease progression noted. Plan to see back in 12 months. Recommend annual flu vaccine.   Patient Active Problem List   Diagnosis Date Noted  . Hyperlipidemia, unspecified 04/07/2018  . CKD (chronic kidney disease), stage III ([place]) 02/09/2018  . Rash 02/09/2018  . Seborrheic dermatitis 01/08/2017  . Former smoker 09/30/2015  . Uterine prolapse 03/29/2015  . Thrombocytopenia ([place]) 11/26/2014  . CLL (chronic lymphocytic leukemia) ([place]) 11/27/2013  . Hyperglycemia 05/08/2013  . Essential hypertension, benign 11/07/2012  . Obesity, unspecified 11/07/2012   Past Surgical History:  Procedure Laterality Date  . APPENDECTOMY    . catarat Right 07/23/2019  . catarat Left 08/31/2019  . TONSILLECTOMY AND ADENOIDECTOMY     Family History  Problem Relation Age of Onset  . Healthy Mother        died age 88 (communist [place] could not get records)  . Other Father        died young-unclear cause   No Known Allergies Outpatient Encounter Medications as of 09/03/2020  Medication Sig  . amLODipine (NORVASC) 10 MG tablet Take 1 tablet (10 mg total) by mouth daily.  Marland Kitchen atenolol (TENORMIN) 100 MG tablet Take 1 tablet (100 mg total) by mouth daily.  . hydrochlorothiazide (HYDRODIURIL) 25 MG tablet Take 1 tablet (25 mg total) by  mouth daily.  Marland Kitchen losartan (COZAAR) 100 MG tablet Take 1 tablet (100 mg total) by mouth daily.  . [DISCONTINUED] predniSONE (DELTASONE) 50 MG tablet Take 1 tablet (50 mg total) by mouth daily with breakfast.   No facility-administered encounter medications on file as of 09/03/2020.   Patient Care Team    Relationship Specialty Notifications Start End  [name], MD PCP - General Family Medicine  09/30/15   Heath Lark, MD Consulting Physician Hematology and Oncology  11/27/13   Madelin Rear, [facility] Pharmacist Pharmacist  05/01/20    Comment: [phone]   Current Diagnosis/Assessment: Goals Addressed            This Visit's Progress   . PharmD Care Plan       CARE PLAN ENTRY (see longitudinal plan of care for additional care plan information)  Current Barriers:  . Chronic Disease Management support, education, and care coordination needs related to Hypertension, Hyperlipidemia, and hyperglycemia.   Hypertension BP Readings from Last 3 Encounters:  05/30/20 126/62  02/27/20 140/61  02/09/20 128/74   . Pharmacist Clinical Goal(s): o Over the next 365 days, patient will work with PharmD and providers to maintain BP goal <140/90 . Current regimen:   Amlodipine 10 mg once daily  Atenolol 100 mg once daily   Hydrochlorothiazide 25 mg once daily  Losartan 100 mg once daily . Interventions: o Reviewed home monitoring recommendations o Reviewed diet and exercise - Maintain a healthy weight and exercise regularly, as directed by your health care  provider. Eat healthy foods, such as: Lean proteins, complex carbohydrates, fresh fruits and vegetables, low-fat dairy products, healthy fats. o Reviewed recommendations to maintain reduced salt intake.  . Patient self care activities - Over the next 365 days, patient will: o Check BP at least once every 1-2 weeks, document, and provide at future appointments o Ensure daily salt intake < 2300 mg/day  Hyperlipidemia Lab Results   Component Value Date/Time   LDLCALC 113 (H) 05/30/2020 02:19 PM   . Pharmacist Clinical Goal(s): o Over the next 365 days, patient will work with PharmD and providers to maintain LDL goal < 100 . Current regimen:  o N/a . Interventions: o Reviewed side effects/tolerability - none noted at this time . Patient self care activities - Over the next 365 days, patient will: o Continue current management  Hyperglycemia Lab Results  Component Value Date/Time   HGBA1C 5.5 05/30/2020 02:19 PM   HGBA1C 5.8 11/23/2019 01:11 PM .  Pharmacist Clinical Goal(s): o Over the next 365 days, patient will work with PharmD and providers to maintain A1c goal <6.5% . Current regimen:  . N/a . Interventions: o Reviewed diet - Maintain a healthy weight and exercise regularly, as directed by your health care provider. Eat healthy foods, such as: Lean proteins, complex carbohydrates, fresh fruits and vegetables, low-fat dairy products, healthy fats. . Patient self care activities - Over the next 365 days, patient will: o Continue current management o Continue to limit intake of white breads  Medication management . Pharmacist Clinical Goal(s): o Over the next 365 days, patient will work with PharmD and providers to maintain optimal medication adherence . Current pharmacy: [facility] . Interventions o Comprehensive medication review performed. o Continue current medication management strategy. . Patient self care activities - Over the next 365 days, patient will: o Take medications as prescribed o Report any questions or concerns to PharmD and/or provider(s) Initial goal documentation.       Hypertension   BP goal <140/90  BP Readings from Last 3 Encounters:  05/30/20 126/62  02/27/20 140/61  02/09/20 128/74    BMP Latest Ref Rng & Units 05/30/2020 11/23/2019 05/18/2019  Glucose 65 - 99 mg/dL 105(H) 129(H) 178(H)  BUN 7 - 25 mg/dL 18 23 29(H)  Creatinine 0.60 - 0.88 mg/dL 0.89(H) 0.93 0.99   BUN/Creat Ratio 6 - 22 (calc) 20 - -  Sodium 135 - 146 mmol/L 141 141 142  Potassium 3.5 - 5.3 mmol/L 4.4 4.4 4.1  Chloride 98 - 110 mmol/L 100 101 103  CO2 20 - 32 mmol/L '29 31 29  ' Calcium 8.6 - 10.4 mg/dL 9.8 9.9 9.5   Tries not to eat white bread, diet full of vegetables fruit.  Continues to do home exercises provided by PT 2x/day ~10 min.  Feels this has improved. CKD III. Patient checks BP at home daily. Recent home readings: 128/70 and 119/70. Typically 120s-130s/70s. Denies any dizziness past several months. Patient is currently at goal on the following medications:   Amlodipine 10 mg once daily  Atenolol 100 mg once daily   Hydrochlorothiazide 25 mg once daily  Losartan 100 mg once daily  Diet and exercise discussed - Maintain a healthy weight and exercise regularly, as directed by your health care provider. Eat healthy foods, such as: Lean proteins, complex carbohydrates, fresh fruits and vegetables, low-fat dairy products, healthy fats. Reviewed recommendations to maintain reduced salt intake.   Plan  Continue current medications.  Hyperglycemia   A1c goal < 6.5%  Lab Results  Component Value Date/Time   HGBA1C 5.5 05/30/2020 02:19 PM   HGBA1C 5.8 11/23/2019 01:11 PM   EGFR 54 (L) 11/28/2015 09:59 AM   GFR 57.11 (L) 11/23/2019 01:11 PM   GFR 53.20 (L) 05/18/2019 01:29 PM   GFR 58.04 (L) 04/07/2018 10:22 AM    Previous medications: n/a. Diet discussed in HTN Patient is currently at goal on the following medications: n/a. Controlled with diet/exercise alone.  Plan  Continue current management.  Hyperlipidemia   LDL goal < 100  Lipid Panel     Component Value Date/Time   CHOL 212 (H) 05/30/2020 1419   TRIG 189 (H) 05/30/2020 1419   HDL 69 05/30/2020 1419   LDLCALC 113 (H) 05/30/2020 1419    Hepatic Function Latest Ref Rng & Units 05/30/2020 05/18/2019 04/07/2018  Total Protein 6.1 - 8.1 g/dL 6.3 6.4 6.7  Albumin 3.5 - 5.2 g/dL - 4.6 4.7  AST 10 - 35  U/L '19 20 17  ' ALT 6 - 29 U/L '13 12 13  ' Alk Phosphatase 39 - 117 U/L - 69 68  Total Bilirubin 0.2 - 1.2 mg/dL 0.6 0.5 0.9  Bilirubin, Direct 0.0 - 0.3 mg/dL - - -    The ASCVD Risk score ([place]., et al., 2013) failed to calculate for the following reasons:   The 2013 ASCVD risk score is only valid for ages 85 to 30   Previous medications: n/a. Diet/exercise discussed in HTN.  Primary prevention. LDL above goal but will not be adding medications due to age.  Patient is currently at goal the following medications:  . n/a  Plan  Continue current medications.  Vaccines   Immunization History  Administered Date(s) Administered  . Influenza Whole 06/30/2013  . Influenza, High Dose Seasonal PF 06/25/2016, 06/21/2019  . Influenza,inj,Quad PF,6+ Mos 06/13/2014  . Influenza-Unspecified 07/12/2015, 06/09/2017, 06/21/2020  . PFIZER SARS-COV-2 Vaccination 12/13/2019, 12/30/2019, 07/12/2020  . Pneumococcal Conjugate-13 06/25/2016  . Pneumococcal Polysaccharide-23 06/21/2013  . Td 04/07/2018   Reviewed and discussed patient's vaccination history.   Updated immunization with October flu and pfizer covid bosoter.    Plan  Recommended patient receive .   Medication Management / Care Coordination   [facility] medications [facility]:  [facility] [place] [place] [facility] [address] [place] [address] Phone: [phone] Fax: [phone]  [id number] [place] [address] [address]Phone: [phone]: [phone]  Denies any issues with current medication management. Family currently helps with medications.  Plan  Continue current medication management strategy. ___________________________ SDOH (Social Determinants of Health) assessments performed: Yes.  Future Appointments  Date Time Provider [place] 11/29/2020  9:40 AM  HMarin Olp MD [facility]  02/25/2021  9:00 AM [facility] LAB [facility] None  02/25/2021  9:30 AM GHeath Lark MD [facility] None  05/12/2021  1:30 PM [facility] CCM PHARMACIST [facility]  06/20/2021  9:30 AM [facility] HEALTH COACH [facility]   Visit follow-up:  . RPH follow-up: ~8 month telephone follow-up visit . CPA follow-up: may BP call  JMadelin Rear Pharm.D., BCGP Clinical Pharmacist [facility] [phone]

## 2020-09-03 ENCOUNTER — Ambulatory Visit: Payer: PPO

## 2020-09-03 DIAGNOSIS — E785 Hyperlipidemia, unspecified: Secondary | ICD-10-CM

## 2020-09-03 DIAGNOSIS — I10 Essential (primary) hypertension: Secondary | ICD-10-CM

## 2020-09-03 DIAGNOSIS — R739 Hyperglycemia, unspecified: Secondary | ICD-10-CM

## 2020-09-03 NOTE — Patient Instructions (Signed)
Ms. Widjaja,  Thank you for taking the time to review your medications with me today.  I have included our care plan/goals in the following pages. Please review and call me at [phone] with any questions!  Thanks! Ellin Mayhew, Pharm.D., BCGP Clinical Pharmacist [facility] [phone]  Goals Addressed            This [place] (see longitudinal plan of care for additional care plan information)  Current Barriers:   Chronic Disease Management support, education, and care coordination needs related to Hypertension, Hyperlipidemia, and hyperglycemia.   Hypertension BP Readings from Last 3 Encounters:  05/30/20 126/62  02/27/20 140/61  02/09/20 128/74    Pharmacist Clinical Goal(s): o Over the next 365 days, patient will work with PharmD and providers to maintain BP goal <140/90  Current regimen:   Amlodipine 10 mg once daily  Atenolol 100 mg once daily   Hydrochlorothiazide 25 mg once daily  Losartan 100 mg once daily  Interventions: o Reviewed home monitoring recommendations o Reviewed diet and exercise - Maintain a healthy weight and exercise regularly, as directed by your health care provider. Eat healthy foods, such as: Lean proteins, complex carbohydrates, fresh fruits and vegetables, low-fat dairy products, healthy fats. o Reviewed recommendations to maintain reduced salt intake.   Patient self care activities - Over the next 365 days, patient will: o Check BP at least once every 1-2 weeks, document, and provide at future appointments o Ensure daily salt intake < 2300 mg/day  Hyperlipidemia Lab Results  Component Value Date/Time   LDLCALC 113 (H) 05/30/2020 02:19 PM    Pharmacist Clinical Goal(s): o Over the next 365 days, patient will work with PharmD and providers to maintain LDL goal < 100  Current regimen:  o N/a  Interventions: o Reviewed side  effects/tolerability - none noted at this time  Patient self care activities - Over the next 365 days, patient will: o Continue current management  Hyperglycemia Lab Results  Component Value Date/Time   HGBA1C 5.5 05/30/2020 02:19 PM   HGBA1C 5.8 11/23/2019 01:11 PM   Pharmacist Clinical Goal(s): o Over the next 365 days, patient will work with PharmD and providers to maintain A1c goal <6.5%  Current regimen:   N/a  Interventions: o Reviewed diet - Maintain a healthy weight and exercise regularly, as directed by your health care provider. Eat healthy foods, such as: Lean proteins, complex carbohydrates, fresh fruits and vegetables, low-fat dairy products, healthy fats.  Patient self care activities - Over the next 365 days, patient will: o Continue current management o Continue to limit intake of white breads  Medication management  Pharmacist Clinical Goal(s): o Over the next 365 days, patient will work with PharmD and providers to maintain optimal medication adherence  [facility]  Interventions o Comprehensive medication review performed. o Continue current medication management strategy.  Patient self care activities - Over the next 365 days, patient will: o Take medications as prescribed o Report any questions or concerns to PharmD and/or provider(s) Initial goal documentation.       The patient verbalized understanding of instructions provided today and agreed to receive a mailed copy of patient instruction and/or educational materials. Telephone follow up appointment with pharmacy team member scheduled for: See next appointment with "Care Management Staff" under "What's Next" below.   Hypertension, Adult High  blood pressure (hypertension) is when the force of blood pumping through the arteries is too strong. The arteries are the blood vessels that carry blood from the heart throughout the body. Hypertension forces the heart to work harder to  pump blood and may cause arteries to become narrow or stiff. Untreated or uncontrolled hypertension can cause a heart attack, heart failure, a stroke, kidney disease, and other problems. A blood pressure reading consists of a higher number over a lower number. Ideally, your blood pressure should be below 120/80. The first ("top") number is called the systolic pressure. It is a measure of the pressure in your arteries as your heart beats. The second ("bottom") number is called the diastolic pressure. It is a measure of the pressure in your arteries as the heart relaxes. What are the causes? The exact cause of this condition is not known. There are some conditions that result in or are related to high blood pressure. What increases the risk? Some risk factors for high blood pressure are under your control. The following factors may make you more likely to develop this condition:  Smoking.  Having type 2 diabetes mellitus, high cholesterol, or both.  Not getting enough exercise or physical activity.  Being overweight.  Having too much fat, sugar, calories, or salt (sodium) in your diet.  Drinking too much alcohol. Some risk factors for high blood pressure may be difficult or impossible to change. Some of these factors include:  Having chronic kidney disease.  Having a family history of high blood pressure.  Age. Risk increases with age.  Race. You may be at higher risk if you are African American.  Gender. Men are at higher risk than women before age 64. After age 35, women are at higher risk than men.  Having obstructive sleep apnea.  Stress. What are the signs or symptoms? High blood pressure may not cause symptoms. Very high blood pressure (hypertensive crisis) may cause:  Headache.  Anxiety.  Shortness of breath.  Nosebleed.  Nausea and vomiting.  Vision changes.  Severe chest pain.  Seizures. How is this diagnosed? This condition is diagnosed by measuring your  blood pressure while you are seated, with your arm resting on a flat surface, your legs uncrossed, and your feet flat on the floor. The cuff of the blood pressure monitor will be placed directly against the skin of your upper arm at the level of your heart. It should be measured at least twice using the same arm. Certain conditions can cause a difference in blood pressure between your right and left arms. Certain factors can cause blood pressure readings to be lower or higher than normal for a short period of time:  When your blood pressure is higher when you are in a health care provider's office than when you are at home, this is called white coat hypertension. Most people with this condition do not need medicines.  When your blood pressure is higher at home than when you are in a health care provider's office, this is called masked hypertension. Most people with this condition may need medicines to control blood pressure. If you have a high blood pressure reading during one visit or you have normal blood pressure with other risk factors, you may be asked to:  Return on a different day to have your blood pressure checked again.  Monitor your blood pressure at home for 1 week or longer. If you are diagnosed with hypertension, you may have other blood or imaging tests  to help your health care provider understand your overall risk for other conditions. How is this treated? This condition is treated by making healthy lifestyle changes, such as eating healthy foods, exercising more, and reducing your alcohol intake. Your health care provider may prescribe medicine if lifestyle changes are not enough to get your blood pressure under control, and if:  Your systolic blood pressure is above 130.  Your diastolic blood pressure is above 80. Your personal target blood pressure may vary depending on your medical conditions, your age, and other factors. Follow these instructions at home: Eating and  drinking   Eat a diet that is high in fiber and potassium, and low in sodium, added sugar, and fat. An example eating plan is called the DASH (Dietary Approaches to Stop Hypertension) diet. To eat this way: ? Eat plenty of fresh fruits and vegetables. Try to fill one half of your plate at each meal with fruits and vegetables. ? Eat whole grains, such as whole-wheat pasta, brown rice, or whole-grain bread. Fill about one fourth of your plate with whole grains. ? Eat or drink low-fat dairy products, such as skim milk or low-fat yogurt. ? Avoid fatty cuts of meat, processed or cured meats, and poultry with skin. Fill about one fourth of your plate with lean proteins, such as fish, chicken without skin, beans, eggs, or tofu. ? Avoid pre-made and processed foods. These tend to be higher in sodium, added sugar, and fat.  Reduce your daily sodium intake. Most people with hypertension should eat less than 1,500 mg of sodium a day.  Do not drink alcohol if: ? Your health care provider tells you not to drink. ? You are pregnant, may be pregnant, or are planning to become pregnant.  If you drink alcohol: ? Limit how much you use to:  0-1 drink a day for women.  0-2 drinks a day for men. ? Be aware of how much alcohol is in your drink. In the [place] one drink equals one 12 oz bottle of beer (355 mL), one 5 oz glass of wine (148 mL), or one 1 oz glass of hard liquor (44 mL). Lifestyle   Work with your health care provider to maintain a healthy body weight or to lose weight. Ask what an ideal weight is for you.  Get at least 30 minutes of exercise most days of the week. Activities may include walking, swimming, or biking.  Include exercise to strengthen your muscles (resistance exercise), such as Pilates or lifting weights, as part of your weekly exercise routine. Try to do these types of exercises for 30 minutes at least 3 days a week.  Do not use any products that contain nicotine or tobacco,  such as cigarettes, e-cigarettes, and chewing tobacco. If you need help quitting, ask your health care provider.  Monitor your blood pressure at home as told by your health care provider.  Keep all follow-up visits as told by your health care provider. This is important. Medicines  Take over-the-counter and prescription medicines only as told by your health care provider. Follow directions carefully. Blood pressure medicines must be taken as prescribed.  Do not skip doses of blood pressure medicine. Doing this puts you at risk for problems and can make the medicine less effective.  Ask your health care provider about side effects or reactions to medicines that you should watch for. Contact a health care provider if you:  Think you are having a reaction to a medicine you are taking.  Have headaches that keep coming back (recurring).  Feel dizzy.  Have swelling in your ankles.  Have trouble with your vision. Get help right away if you:  Develop a severe headache or confusion.  Have unusual weakness or numbness.  Feel faint.  Have severe pain in your chest or abdomen.  Vomit repeatedly.  Have trouble breathing. Summary  Hypertension is when the force of blood pumping through your arteries is too strong. If this condition is not controlled, it may put you at risk for serious complications.  Your personal target blood pressure may vary depending on your medical conditions, your age, and other factors. For most people, a normal blood pressure is less than 120/80.  Hypertension is treated with lifestyle changes, medicines, or a combination of both. Lifestyle changes include losing weight, eating a healthy, low-sodium diet, exercising more, and limiting alcohol. This information is not intended to replace advice given to you by your health care provider. Make sure you discuss any questions you have with your health care provider. Document Revised: 05/18/2018 Document Reviewed:  05/18/2018 Elsevier Patient Education  2020 [employer].

## 2020-11-19 DIAGNOSIS — I4821 Permanent atrial fibrillation: Secondary | ICD-10-CM

## 2020-11-19 HISTORY — DX: Permanent atrial fibrillation: I48.21

## 2020-11-28 NOTE — Patient Instructions (Addendum)
Please stop by lab before you go If you have mychart- we will send your results within 3 business days of [place] receiving them.  If you do not have mychart- we will call you about results within 5 business days of [place] receiving them.  *please also note that you will see labs on mychart as soon as they post. I will later go in and write notes on them- will say "notes from Dr. Yong Channel"  No changes today unless labs lead [place] to make changes  Team please complete EKG- I want to make sure no atrial fibrillation  Recommended follow up: Return in about 6 months (around 06/01/2021) for physical or sooner if needed.

## 2020-11-28 NOTE — Progress Notes (Signed)
Phone [phone] In person visit   Subjective:   Maria Lowe is a 87 y.o. year old very pleasant female patient who presents for/with See problem oriented charting Chief Complaint  Patient presents with  . Hypertension  . Hyperlipidemia  . Hyperglycemia    This visit occurred during the SARS-CoV-2 public health emergency.  Safety protocols were in place, including screening questions prior to the visit, additional usage of staff PPE, and extensive cleaning of exam room while observing appropriate contact time as indicated for disinfecting solutions.   Past Medical History-  Patient Active Problem List   Diagnosis Date Noted  . Thrombocytopenia ([place]) 11/26/2014    Priority: High  . CLL (chronic lymphocytic leukemia) ([place]) 11/27/2013    Priority: High  . Hyperlipidemia, unspecified 04/07/2018    Priority: Medium  . CKD (chronic kidney disease), stage III ([place]) 02/09/2018    Priority: Medium  . Hyperglycemia 05/08/2013    Priority: Medium  . Essential hypertension, benign 11/07/2012    Priority: Medium  . Rash 02/09/2018    Priority: Low  . Seborrheic dermatitis 01/08/2017    Priority: Low  . Former smoker 09/30/2015    Priority: Low  . Uterine prolapse 03/29/2015    Priority: Low  . Obesity, unspecified 11/07/2012    Priority: Low    Medications- reviewed and updated Current Outpatient Medications  Medication Sig Dispense Refill  . amLODipine (NORVASC) 10 MG tablet Take 1 tablet (10 mg total) by mouth daily. 90 tablet 3  . atenolol (TENORMIN) 100 MG tablet Take 1 tablet (100 mg total) by mouth daily. 90 tablet 3  . hydrochlorothiazide (HYDRODIURIL) 25 MG tablet Take 1 tablet (25 mg total) by mouth daily. 90 tablet 3  . losartan (COZAAR) 100 MG tablet Take 1 tablet (100 mg total) by mouth daily. 90 tablet 3   No current facility-administered medications for this visit.     Objective:  BP 138/86   Pulse 84   Temp 98.2 F (36.8 C) (Temporal)   Ht 5\' 2"   (1.575 m)   Wt 168 lb 6.4 oz (76.4 kg)   SpO2 99%   BMI 30.80 kg/m  Gen: NAD, resting comfortably CV: irregularly irregular (ekg to rule out a fib) no murmurs rubs or gallops Lungs: CTAB no crackles, wheeze, rhonchi Abdomen: soft/nontender/nondistended/normal bowel sounds. No rebound or guarding.  Ext: 1+ edema Skin: warm, dry  EKG: Atrial fibrillation with rate of 82, left axis, normal intervals other than PR not present, no hypertrophy, poor R wave progression and QRS and T waves in opposite directions and V1 to V5.  Atrial fibrillation is new from last visit     Assessment and Plan   #social update- [place] get back to [place] but with war not able to go   #New onset atrial fibrillation S: Patient was asymptomatic today but on exam had a regular heart rate. A/P: On EKG patient with atrial fibrillation.  We will start Eliquis 5 mg twice daily for anticoagulation due to CHA2DS2-VASc score of 4 (has CKD but creatinine is less than 1.5 and greater than 60 kg).  She is already on atenolol and is rate controlled.  We will place a referral to cardiology also due to poor R wave progression   From result note "I had wanted patient to come back to the room after lab but apparently left the office.   Please tell patient she has a rhythm called atrial fibrillation.  This can cause increased risk of stroke but  we can reduce this by starting Eliquis 5 mg twice daily-I sent this in for her.  It can be expensive but I am hoping her insurance will cover it well.  I also placed a referral to cardiology for their expert opinion on this-specifically to the atrial fibrillation clinic and I am hoping she can be seen within 2 weeks.  The other thing that could be dangerous about atrial fibrillation as the heart rate can get too high but likely she is already on atenolol and her heart rate is well controlled-continue current medicine"  #hypertension S: medication: Amlodipine 10Mg , Atenolol 100Mg , HCTZ 25Mg ,  losartan 100MG  Home readings #s: 128/79 but some ankle swelling BP Readings from Last 3 Encounters:  11/29/20 138/86  05/30/20 126/62  02/27/20 140/61  A/P:  Stable. Continue current medications.   -some swelling recommended trial amlodipine 5 mg- she declines. Recommended elevating legs or compression stockings  #Chronic kidney disease stage III  S: GFR is typically in the 50s range -Patient knows to avoid NSAIDs  A/P: hopefully stable- update cmp   #CLL-follows closely with Dr. Alvy Bimler and has been stable.  We will follow CBC with differential today.  Also has thrombocytopenia related to CLL which we will also monitor -You with thrombocytopenia I think with level over 100 she can tolerate Eliquis  #hyperlipidemia S: Medication:None. Trying to eat a healthy diet- down 3 lbs from last visit Lab Results  Component Value Date   CHOL 212 (H) 05/30/2020   HDL 69 05/30/2020   LDLCALC 113 (H) 05/30/2020   TRIG 189 (H) 05/30/2020   CHOLHDL 3.1 05/30/2020   A/P: Patient remains off medication given over age 50 without history of heart attack stroke-discussed healthy eating as main treatment at this point  # Hyperglycemia/insulin resistance/prediabetes S:  Medication: None Exercise and diet- trying to eat a healthier diet. Doing exercise for balance Lab Results  Component Value Date   HGBA1C 5.5 05/30/2020   HGBA1C 5.8 11/23/2019   HGBA1C 6.0 05/18/2019   A/P: A1c improved at last visit-we will continue to trend-if still out of prediabetes range today likely change to once yearly  Recommended follow up: Return in about 6 months (around 06/01/2021) for physical or sooner if needed. Future Appointments  Date Time Provider [place]  02/25/2021  9:00 AM [facility] None  02/25/2021  9:30 AM Heath Lark, MD [facility] None  05/12/2021  1:30 PM [facility] CCM PHARMACIST [facility] PEC  06/20/2021  9:30 AM [facility] HEALTH COACH [facility]    Lab/Order associations:    ICD-10-CM   1. Essential hypertension, benign  I10 Comprehensive metabolic panel  2. Hyperlipidemia, unspecified hyperlipidemia type  E78.5 Comprehensive metabolic panel  3. Hyperglycemia  R73.9 Hemoglobin A1c  4. CLL (chronic lymphocytic leukemia) (HCC) Chronic C91.10 CBC with Differential/Platelet  5. Thrombocytopenia ([place]) Chronic D69.6 CBC with Differential/Platelet      Return precautions advised.  [name], MD

## 2020-11-29 ENCOUNTER — Encounter: Payer: Self-pay | Admitting: Family Medicine

## 2020-11-29 ENCOUNTER — Other Ambulatory Visit: Payer: Self-pay

## 2020-11-29 ENCOUNTER — Ambulatory Visit (INDEPENDENT_AMBULATORY_CARE_PROVIDER_SITE_OTHER): Payer: PPO | Admitting: Family Medicine

## 2020-11-29 VITALS — BP 138/86 | HR 84 | Temp 98.2°F | Ht 62.0 in | Wt 168.4 lb

## 2020-11-29 DIAGNOSIS — I499 Cardiac arrhythmia, unspecified: Secondary | ICD-10-CM

## 2020-11-29 DIAGNOSIS — D696 Thrombocytopenia, unspecified: Secondary | ICD-10-CM | POA: Diagnosis not present

## 2020-11-29 DIAGNOSIS — E785 Hyperlipidemia, unspecified: Secondary | ICD-10-CM

## 2020-11-29 DIAGNOSIS — Z87891 Personal history of nicotine dependence: Secondary | ICD-10-CM

## 2020-11-29 DIAGNOSIS — I10 Essential (primary) hypertension: Secondary | ICD-10-CM

## 2020-11-29 DIAGNOSIS — I4819 Other persistent atrial fibrillation: Secondary | ICD-10-CM | POA: Insufficient documentation

## 2020-11-29 DIAGNOSIS — C9110 Chronic lymphocytic leukemia of B-cell type not having achieved remission: Secondary | ICD-10-CM | POA: Diagnosis not present

## 2020-11-29 DIAGNOSIS — R739 Hyperglycemia, unspecified: Secondary | ICD-10-CM | POA: Diagnosis not present

## 2020-11-29 DIAGNOSIS — I4891 Unspecified atrial fibrillation: Secondary | ICD-10-CM | POA: Diagnosis not present

## 2020-11-29 LAB — CBC WITH DIFFERENTIAL/PLATELET
Basophils Absolute: 0 10*3/uL (ref 0.0–0.1)
Basophils Relative: 0.2 % (ref 0.0–3.0)
Eosinophils Absolute: 0.1 10*3/uL (ref 0.0–0.7)
Eosinophils Relative: 0.5 % (ref 0.0–5.0)
HCT: 42.4 % (ref 36.0–46.0)
Hemoglobin: 14 g/dL (ref 12.0–15.0)
Lymphocytes Relative: 66.8 % — ABNORMAL HIGH (ref 12.0–46.0)
Lymphs Abs: 11.8 10*3/uL — ABNORMAL HIGH (ref 0.7–4.0)
MCHC: 33 g/dL (ref 30.0–36.0)
MCV: 87.7 fl (ref 78.0–100.0)
Monocytes Absolute: 0.6 10*3/uL (ref 0.1–1.0)
Monocytes Relative: 3.6 % (ref 3.0–12.0)
Neutro Abs: 5.1 10*3/uL (ref 1.4–7.7)
Neutrophils Relative %: 28.9 % — ABNORMAL LOW (ref 43.0–77.0)
Platelets: 118 10*3/uL — ABNORMAL LOW (ref 150.0–400.0)
RBC: 4.84 Mil/uL (ref 3.87–5.11)
RDW: 14.9 % (ref 11.5–15.5)
WBC: 17.7 10*3/uL — ABNORMAL HIGH (ref 4.0–10.5)

## 2020-11-29 LAB — COMPREHENSIVE METABOLIC PANEL
ALT: 41 U/L — ABNORMAL HIGH (ref 0–35)
AST: 43 U/L — ABNORMAL HIGH (ref 0–37)
Albumin: 4.5 g/dL (ref 3.5–5.2)
Alkaline Phosphatase: 78 U/L (ref 39–117)
BUN: 23 mg/dL (ref 6–23)
CO2: 26 mEq/L (ref 19–32)
Calcium: 9.5 mg/dL (ref 8.4–10.5)
Chloride: 99 mEq/L (ref 96–112)
Creatinine, Ser: 1.07 mg/dL (ref 0.40–1.20)
GFR: 46.73 mL/min — ABNORMAL LOW (ref >60.00)
Glucose, Bld: 129 mg/dL — ABNORMAL HIGH (ref 70–99)
Potassium: 4.1 mEq/L (ref 3.5–5.1)
Sodium: 137 mEq/L (ref 135–145)
Total Bilirubin: 1 mg/dL (ref 0.2–1.2)
Total Protein: 6.4 g/dL (ref 6.0–8.3)

## 2020-11-29 LAB — HEMOGLOBIN A1C: Hgb A1c MFr Bld: 5.9 % (ref 4.6–6.5)

## 2020-11-29 MED ORDER — APIXABAN 5 MG PO TABS
5.0000 mg | ORAL_TABLET | Freq: Two times a day (BID) | ORAL | 5 refills | Status: DC
Start: 2020-11-29 — End: 2021-05-21

## 2020-12-02 ENCOUNTER — Other Ambulatory Visit: Payer: Self-pay

## 2020-12-02 DIAGNOSIS — R7989 Other specified abnormal findings of blood chemistry: Secondary | ICD-10-CM

## 2020-12-10 ENCOUNTER — Encounter (HOSPITAL_COMMUNITY): Payer: Self-pay | Admitting: Physician Assistant

## 2020-12-10 ENCOUNTER — Ambulatory Visit (HOSPITAL_COMMUNITY)
Admission: RE | Admit: 2020-12-10 | Discharge: 2020-12-10 | Disposition: A | Discharge status: Home / Self Care | Payer: PPO | Source: Ambulatory Visit | Attending: Physician Assistant | Admitting: Physician Assistant

## 2020-12-10 ENCOUNTER — Other Ambulatory Visit: Payer: Self-pay

## 2020-12-10 VITALS — BP 112/64 | HR 94 | Ht 62.0 in | Wt 159.2 lb

## 2020-12-10 DIAGNOSIS — I4891 Unspecified atrial fibrillation: Secondary | ICD-10-CM | POA: Diagnosis present

## 2020-12-10 DIAGNOSIS — I10 Essential (primary) hypertension: Secondary | ICD-10-CM | POA: Insufficient documentation

## 2020-12-10 DIAGNOSIS — E785 Hyperlipidemia, unspecified: Secondary | ICD-10-CM | POA: Diagnosis not present

## 2020-12-10 DIAGNOSIS — D6869 Other thrombophilia: Secondary | ICD-10-CM | POA: Diagnosis not present

## 2020-12-10 DIAGNOSIS — I4819 Other persistent atrial fibrillation: Secondary | ICD-10-CM | POA: Insufficient documentation

## 2020-12-10 NOTE — Progress Notes (Signed)
Primary Care Physician: [name], MD Primary Cardiologist: none Primary Electrophysiologist: none Referring Physician: Dr Rhunette Croft Maria Lowe is a 87 y.o. female with a history of CLL, HTN, CKD, HLD, and atrial fibrillation who presents for consultation in the [facility].  The patient was initially diagnosed with atrial fibrillation 11/29/20 after presenting to her PCP for follow up. Her heart rate was irregular on auscultation and ECG showed rate controlled afib. Patient was started on Eliquis for a CHADS2VASC score of 4. She reports no symptoms with her arrhythmia. She denies any bleeding issues on anticoagulation.   Today, she denies symptoms of palpitations, chest pain, shortness of breath, orthopnea, PND, lower extremity edema, dizziness, presyncope, syncope, snoring, daytime somnolence, bleeding, or neurologic sequela. The patient is tolerating medications without difficulties and is otherwise without complaint today.    Atrial Fibrillation Risk Factors:  she does not have symptoms or diagnosis of sleep apnea. she does not have a history of rheumatic fever. she does not have a history of alcohol use.   she has a BMI of Body mass index is 29.12 kg/m.Marland Kitchen Filed Weights   12/10/20 1431  Weight: 72.2 kg    Family History  Problem Relation Age of Onset  . Healthy Mother        died age 45 (communist [place] could not get records)  . Other Father        died young-unclear cause     Atrial Fibrillation Management history:  Previous antiarrhythmic drugs: none Previous cardioversions: none Previous ablations: none CHADS2VASC score: 4 Anticoagulation history: Eliquis   Past Medical History:  Diagnosis Date  . Arthritis    knee  . CLL (chronic lymphocytic leukemia) ([place]) 11/27/2013  . Hypertension   . Ulcer    stomach ulcer when young   Past Surgical History:  Procedure Laterality Date  . APPENDECTOMY    . catarat Right  07/23/2019  . catarat Left 08/31/2019  . TONSILLECTOMY AND ADENOIDECTOMY      Current Outpatient Medications  Medication Sig Dispense Refill  . amLODipine (NORVASC) 10 MG tablet Take 1 tablet (10 mg total) by mouth daily. 90 tablet 3  . apixaban (ELIQUIS) 5 MG TABS tablet Take 1 tablet (5 mg total) by mouth 2 (two) times daily. 60 tablet 5  . atenolol (TENORMIN) 100 MG tablet Take 1 tablet (100 mg total) by mouth daily. 90 tablet 3  . hydrochlorothiazide (HYDRODIURIL) 25 MG tablet Take 1 tablet (25 mg total) by mouth daily. 90 tablet 3  . losartan (COZAAR) 100 MG tablet Take 1 tablet (100 mg total) by mouth daily. 90 tablet 3   No current facility-administered medications for this encounter.    No Known Allergies  Social History   Socioeconomic History  . Marital status: Married    Spouse name: Not on file  . Number of children: Not on file  . Years of education: Not on file  . Highest education level: Not on file  Occupational History  . Occupation: retired  Tobacco Use  . Smoking status: Former Smoker    Packs/day: 0.20    Years: 20.00    Pack years: 4.00    Types: Cigarettes    Quit date: 09/21/1978    Years since quitting: 42.2  . Smokeless tobacco: Never Used  Substance and Sexual Activity  . Alcohol use: Yes    Alcohol/week: 7.0 standard drinks    Types: 7 Standard drinks or equivalent per week  .  Drug use: No  . Sexual activity: Not on file  Other Topics Concern  . Not on file  Social History Narrative   Widowed April 2016. 3 children. 7 grandkids. 3 greatgrandkids.   Husband worked as Chief Executive Officer.       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 Difficulty of Paying Living Expenses: Not hard at all  Food Insecurity: No Food Insecurity  . Worried About Charity fundraiser in the Last Year:  Never true  . Ran Out of Food in the Last Year: Never true  Transportation Needs: No Transportation Needs  . Lack of Transportation (Medical): No  . Lack of Transportation (Non-Medical): No  Physical Activity: Insufficiently Active  . Days of Exercise per Week: 2 days  . Minutes of Exercise per Session: 20 min  Stress: No Stress Concern Present  . Feeling of Stress : Not at all  Social Connections: Socially Isolated  . Frequency of Communication with Friends and Family: More than three times a week  . Frequency of Social Gatherings with Friends and Family: Never  . Attends Religious Services: Never  . Active Member of Clubs or Organizations: No  . Attends Archivist Meetings: Never  . Marital Status: Widowed  Intimate Partner Violence: Not At Risk  . Fear of Current or Ex-Partner: No  . Emotionally Abused: No  . Physically Abused: No  . Sexually Abused: No     ROS- All systems are reviewed and negative except as per the HPI above.  Physical Exam: Vitals:   12/10/20 1431  BP: 112/64  Pulse: 94  Weight: 72.2 kg  Height: 5\' 2"  (1.575 m)    GEN- The patient is a well appearing elderly female, alert and oriented x 3 today.   Head- normocephalic, atraumatic Eyes-  Sclera clear, conjunctiva pink Ears- hearing intact Oropharynx- clear Neck- supple  Lungs- Clear to ausculation bilaterally, normal work of breathing Heart- irregular rate and rhythm, no murmurs, rubs or gallops  GI- soft, NT, ND, + BS Extremities- no clubbing, cyanosis, or edema MS- no significant deformity or atrophy Skin- no rash or lesion Psych- euthymic mood, full affect Neuro- strength and sensation are intact  Wt Readings from Last 3 Encounters:  12/10/20 72.2 kg  11/29/20 76.4 kg  05/30/20 77.7 kg    EKG today demonstrates  Afib, LAFB Vent. rate 94 BPM PR interval * ms QRS duration 120 ms QT/QTc 360/450 ms  Epic records are reviewed at length today  CHA2DS2-VASc Score = 4  The  patient's score is based upon: CHF History: No HTN History: Yes Diabetes History: No Stroke History: No Vascular Disease History: No Age Score: 2 Gender Score: 1      ASSESSMENT AND PLAN: 1. Persistent Atrial Fibrillation (ICD10:  I48.19) The patient's CHA2DS2-VASc score is 4, indicating a 4.8% annual risk of stroke.   General education about afib provided and questions answered. We also discussed her stroke risk and the risks and benefits of anticoagulation. We discussed therapeutic options today including rate vs rhythm control. Given her age and paucity of symptoms, patient would like to pursue a rate control strategy. She does not want a DCCV.  Check echocardiogram Continue Eliquis 5 mg BID (weight >60kg Cr <1.5) Continue atenolol 100 mg daily  2. Secondary Hypercoagulable State (ICD10:  D68.69) The patient is at significant risk for stroke/thromboembolism based upon her CHA2DS2-VASc Score of 4.  Continue Apixaban (Eliquis).   3. HTN Stable, no changes today.   Follow up in the AF clinic in one month.    [facility] [address] [place], [place] [address] [phone] 12/10/2020 2:50 PM

## 2021-01-06 ENCOUNTER — Other Ambulatory Visit: Payer: PPO

## 2021-01-09 ENCOUNTER — Other Ambulatory Visit: Payer: Self-pay

## 2021-01-09 ENCOUNTER — Other Ambulatory Visit (INDEPENDENT_AMBULATORY_CARE_PROVIDER_SITE_OTHER): Payer: PPO

## 2021-01-09 DIAGNOSIS — R7989 Other specified abnormal findings of blood chemistry: Secondary | ICD-10-CM | POA: Diagnosis not present

## 2021-01-09 LAB — COMPREHENSIVE METABOLIC PANEL WITH GFR
ALT: 28 U/L (ref 0–35)
AST: 30 U/L (ref 0–37)
Albumin: 4.3 g/dL (ref 3.5–5.2)
Alkaline Phosphatase: 61 U/L (ref 39–117)
BUN: 27 mg/dL — ABNORMAL HIGH (ref 6–23)
CO2: 29 meq/L (ref 19–32)
Calcium: 9.7 mg/dL (ref 8.4–10.5)
Chloride: 100 meq/L (ref 96–112)
Creatinine, Ser: 1 mg/dL (ref 0.40–1.20)
GFR: 50.64 mL/min — ABNORMAL LOW
Glucose, Bld: 131 mg/dL — ABNORMAL HIGH (ref 70–99)
Potassium: 4.5 meq/L (ref 3.5–5.1)
Sodium: 138 meq/L (ref 135–145)
Total Bilirubin: 0.8 mg/dL (ref 0.2–1.2)
Total Protein: 6.4 g/dL (ref 6.0–8.3)

## 2021-01-22 NOTE — Progress Notes (Signed)
Primary Care Physician: [name], MD Primary Cardiologist: none Primary Electrophysiologist: none Referring Physician: Dr Rhunette Croft Maria Lowe is a 87 y.o. female with a history of CLL, HTN, CKD, HLD, and atrial fibrillation who presents for follow up in the [facility].  The patient was initially diagnosed with atrial fibrillation 11/29/20 after presenting to her PCP for follow up. Her heart rate was irregular on auscultation and ECG showed rate controlled afib. Patient was started on Eliquis for a CHADS2VASC score of 4. She reports no symptoms with her arrhythmia.   On follow up today, patient reports she has done well since her last visit. She remains in asymptomatic rate controlled afib. She denies any bleeding issues on anticoagulation. She had an echo earlier today and the results are pending.   Today, she denies symptoms of palpitations, chest pain, shortness of breath, orthopnea, PND, lower extremity edema, dizziness, presyncope, syncope, snoring, daytime somnolence, bleeding, or neurologic sequela. The patient is tolerating medications without difficulties and is otherwise without complaint today.    Atrial Fibrillation Risk Factors:  she does not have symptoms or diagnosis of sleep apnea. she does not have a history of rheumatic fever. she does not have a history of alcohol use.   she has a BMI of Body mass index is 28.68 kg/m.Marland Kitchen Filed Weights   01/23/21 1017  Weight: 71.1 kg    Family History  Problem Relation Age of Onset  . Healthy Mother        died age 1 (communist country could not get records)  . Other Father        died young-unclear cause     Atrial Fibrillation Management history:  Previous antiarrhythmic drugs: none Previous cardioversions: none Previous ablations: none CHADS2VASC score: 4 Anticoagulation history: Eliquis   Past Medical History:  Diagnosis Date  . Arthritis    knee  . CLL (chronic  lymphocytic leukemia) ([place]) 11/27/2013  . Hypertension   . Ulcer    stomach ulcer when young   Past Surgical History:  Procedure Laterality Date  . APPENDECTOMY    . catarat Right 07/23/2019  . catarat Left 08/31/2019  . TONSILLECTOMY AND ADENOIDECTOMY      Current Outpatient Medications  Medication Sig Dispense Refill  . amLODipine (NORVASC) 10 MG tablet Take 1 tablet (10 mg total) by mouth daily. 90 tablet 3  . apixaban (ELIQUIS) 5 MG TABS tablet Take 1 tablet (5 mg total) by mouth 2 (two) times daily. 60 tablet 5  . atenolol (TENORMIN) 100 MG tablet Take 1 tablet (100 mg total) by mouth daily. 90 tablet 3  . hydrochlorothiazide (HYDRODIURIL) 25 MG tablet Take 1 tablet (25 mg total) by mouth daily. 90 tablet 3  . losartan (COZAAR) 100 MG tablet Take 1 tablet (100 mg total) by mouth daily. 90 tablet 3  . naproxen sodium (ALEVE) 220 MG tablet Take 220 mg by mouth daily as needed (pain).     No current facility-administered medications for this encounter.    No Known Allergies  Social History   Socioeconomic History  . Marital status: Married    Spouse name: Not on file  . Number of children: Not on file  . Years of education: Not on file  . Highest education level: Not on file  Occupational History  . Occupation: retired  Tobacco Use  . Smoking status: Former Smoker    Packs/day: 0.20    Years: 20.00    Pack years:  4.00    Types: Cigarettes    Quit date: 09/21/1978    Years since quitting: 42.3  . Smokeless tobacco: Never Used  Substance and Sexual Activity  . Alcohol use: Not Currently    Alcohol/week: 7.0 standard drinks    Types: 7 Standard drinks or equivalent per week  . Drug use: No  . Sexual activity: Not on file  Other Topics Concern  . Not on file  Social History Narrative   Widowed April 2016. 3 children. 7 grandkids. 3 greatgrandkids.   Husband worked as Chief Executive Officer.       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 Difficulty of Paying Living Expenses: Not hard at all  Food Insecurity: No Food Insecurity  . Worried About Charity fundraiser in the Last Year: Never true  . Ran Out of Food in the Last Year: Never true  Transportation Needs: No Transportation Needs  . Lack of Transportation (Medical): No  . Lack of Transportation (Non-Medical): No  Physical Activity: Insufficiently Active  . Days of Exercise per Week: 2 days  . Minutes of Exercise per Session: 20 min  Stress: No Stress Concern Present  . Feeling of Stress : Not at all  Social Connections: Socially Isolated  . Frequency of Communication with Friends and Family: More than three times a week  . Frequency of Social Gatherings with Friends and Family: Never  . Attends Religious Services: Never  . Active Member of Clubs or Organizations: No  . Attends Archivist Meetings: Never  . Marital Status: Widowed  Intimate Partner Violence: Not At Risk  . Fear of Current or Ex-Partner: No  . Emotionally Abused: No  . Physically Abused: No  . Sexually Abused: No     ROS- All systems are reviewed and negative except as per the HPI above.  Physical Exam: Vitals:   01/23/21 1017  BP: 126/72  Pulse: 89  Weight: 71.1 kg  Height: 5\' 2"  (1.575 m)    GEN- The patient is a well appearing elderly female, alert and oriented x 3 today.   HEENT-head normocephalic, atraumatic, sclera clear, conjunctiva pink, hearing intact, trachea midline. Lungs- Clear to ausculation bilaterally, normal work of breathing Heart- irregular rate and rhythm, no murmurs, rubs or gallops  GI- soft, NT, ND, + BS Extremities- no clubbing, cyanosis, or edema MS- no significant deformity or atrophy Skin- no rash or lesion Psych- euthymic mood, full affect Neuro- strength and sensation are intact   Wt Readings from Last 3  Encounters:  01/23/21 71.1 kg  12/10/20 72.2 kg  11/29/20 76.4 kg    EKG today demonstrates  Afib  Vent. rate 89 BPM PR interval * ms QRS duration 114 ms QT/QTcB 368/447 ms  Epic records are reviewed at length today  CHA2DS2-VASc Score = 4  The patient's score is based upon: CHF History: No HTN History: Yes Diabetes History: No Stroke History: No Vascular Disease History: No Age Score: 2 Gender Score: 1      ASSESSMENT AND PLAN: 1. Persistent Atrial Fibrillation (ICD10:  I48.19) The patient's CHA2DS2-VASc score is 4, indicating a 4.8% annual risk of stroke.   Patient asymptomatic in rate controlled afib.  Recall patient would like to pursue a rate control strategy. She does not want a DCCV.  Echocardiogram results  pending.  Continue Eliquis 5 mg BID (weight >60kg Cr <1.5) Check cbc today. Continue atenolol 100 mg daily  2. Secondary Hypercoagulable State (ICD10:  D68.69) The patient is at significant risk for stroke/thromboembolism based upon her CHA2DS2-VASc Score of 4.  Continue Apixaban (Eliquis).   3. HTN Stable, no changes today.   Follow up in the AF clinic in 3 months.    [facility] [address] [place],  [address] [phone] 01/23/2021 10:40 AM

## 2021-01-23 ENCOUNTER — Ambulatory Visit (HOSPITAL_COMMUNITY)
Admission: RE | Admit: 2021-01-23 | Discharge: 2021-01-23 | Disposition: A | Discharge status: Home / Self Care | Payer: PPO | Source: Ambulatory Visit | Attending: Family Medicine | Admitting: Family Medicine

## 2021-01-23 ENCOUNTER — Ambulatory Visit (HOSPITAL_BASED_OUTPATIENT_CLINIC_OR_DEPARTMENT_OTHER)
Admission: RE | Admit: 2021-01-23 | Discharge: 2021-01-23 | Disposition: A | Discharge status: Home / Self Care | Payer: PPO | Source: Ambulatory Visit | Attending: Physician Assistant | Admitting: Physician Assistant

## 2021-01-23 ENCOUNTER — Encounter (HOSPITAL_COMMUNITY): Payer: Self-pay | Admitting: Physician Assistant

## 2021-01-23 ENCOUNTER — Other Ambulatory Visit: Payer: Self-pay

## 2021-01-23 VITALS — BP 126/72 | HR 89 | Ht 62.0 in | Wt 156.8 lb

## 2021-01-23 DIAGNOSIS — N189 Chronic kidney disease, unspecified: Secondary | ICD-10-CM | POA: Diagnosis not present

## 2021-01-23 DIAGNOSIS — Z7901 Long term (current) use of anticoagulants: Secondary | ICD-10-CM | POA: Diagnosis not present

## 2021-01-23 DIAGNOSIS — Z87891 Personal history of nicotine dependence: Secondary | ICD-10-CM | POA: Insufficient documentation

## 2021-01-23 DIAGNOSIS — D6869 Other thrombophilia: Secondary | ICD-10-CM | POA: Diagnosis not present

## 2021-01-23 DIAGNOSIS — I129 Hypertensive chronic kidney disease with stage 1 through stage 4 chronic kidney disease, or unspecified chronic kidney disease: Secondary | ICD-10-CM | POA: Insufficient documentation

## 2021-01-23 DIAGNOSIS — I4819 Other persistent atrial fibrillation: Secondary | ICD-10-CM | POA: Diagnosis not present

## 2021-01-23 DIAGNOSIS — Z79899 Other long term (current) drug therapy: Secondary | ICD-10-CM | POA: Diagnosis not present

## 2021-01-23 DIAGNOSIS — E785 Hyperlipidemia, unspecified: Secondary | ICD-10-CM | POA: Diagnosis not present

## 2021-01-23 DIAGNOSIS — I081 Rheumatic disorders of both mitral and tricuspid valves: Secondary | ICD-10-CM | POA: Insufficient documentation

## 2021-01-23 LAB — CBC
HCT: 46.2 % — ABNORMAL HIGH (ref 36.0–46.0)
Hemoglobin: 14.7 g/dL (ref 12.0–15.0)
MCH: 28.1 pg (ref 26.0–34.0)
MCHC: 31.8 g/dL (ref 30.0–36.0)
MCV: 88.2 fL (ref 80.0–100.0)
Platelets: 115 10*3/uL — ABNORMAL LOW (ref 150–400)
RBC: 5.24 MIL/uL — ABNORMAL HIGH (ref 3.87–5.11)
RDW: 14.9 % (ref 11.5–15.5)
WBC: 21.4 10*3/uL — ABNORMAL HIGH (ref 4.0–10.5)
nRBC: 0.2 % (ref 0.0–0.2)

## 2021-01-23 LAB — ECHOCARDIOGRAM COMPLETE
Area-P 1/2: 3.45 cm2
S' Lateral: 2.6 cm

## 2021-01-23 NOTE — Progress Notes (Signed)
  Echocardiogram 2D Echocardiogram has been performed.  Maria Lowe [name] Newborn 01/23/2021, 10:02 AM

## 2021-01-24 ENCOUNTER — Telehealth: Payer: Self-pay

## 2021-01-24 NOTE — Chronic Care Management (AMB) (Signed)
Chronic Care Management Pharmacy Assistant   Name: Maria Lowe  MRN: [id number] DOB: 07-16-1931  Reason for Encounter:Hypertension Disease state call   Recent office visits:  11/29/20- [name] MD (PCP)- Office visit for chronic conditions and new onset Atrial Fibrillation. Started Apixaban 5 mg 1 tab bid. Labs and EKG performed. Referral to cardiology.  Follow up in 6 months.   Recent consult visits:  01/23/21- Malka So PA (Cardiology)- Office visit for atrial fibrillation.  Labs performed, EKG ordered. Follow up in 3 months.  12/10/20-Clint Fenton PA (Cardiology)- Seen for consultation for Atrial Fibrillation.  No changes, follow up in 1 month  Hospital visits:  None in previous 6 months  Medications: Outpatient Encounter Medications as of 01/24/2021  Medication Sig  . amLODipine (NORVASC) 10 MG tablet Take 1 tablet (10 mg total) by mouth daily.  Marland Kitchen apixaban (ELIQUIS) 5 MG TABS tablet Take 1 tablet (5 mg total) by mouth 2 (two) times daily.  Marland Kitchen atenolol (TENORMIN) 100 MG tablet Take 1 tablet (100 mg total) by mouth daily.  . hydrochlorothiazide (HYDRODIURIL) 25 MG tablet Take 1 tablet (25 mg total) by mouth daily.  Marland Kitchen losartan (COZAAR) 100 MG tablet Take 1 tablet (100 mg total) by mouth daily.  . naproxen sodium (ALEVE) 220 MG tablet Take 220 mg by mouth daily as needed (pain).   No facility-administered encounter medications on file as of 01/24/2021.    Reviewed chart prior to disease state call. Spoke with patient regarding BP  Recent Office Vitals: BP Readings from Last 3 Encounters:  01/23/21 126/72  12/10/20 112/64  11/29/20 138/86   Pulse Readings from Last 3 Encounters:  01/23/21 89  12/10/20 94  11/29/20 84    Wt Readings from Last 3 Encounters:  01/23/21 156 lb 12.8 oz (71.1 kg)  12/10/20 159 lb 3.2 oz (72.2 kg)  11/29/20 168 lb 6.4 oz (76.4 kg)     Kidney Function Lab Results  Component Value Date/Time   CREATININE 1.00 01/09/2021 09:33 AM    CREATININE 1.07 11/29/2020 10:23 AM   CREATININE 0.89 (H) 05/30/2020 02:19 PM   CREATININE 1.0 11/28/2015 09:59 AM   CREATININE 0.8 11/27/2013 08:36 AM   GFR 50.64 (L) 01/09/2021 09:33 AM   GFRNONAA 59 (L) 05/30/2020 02:19 PM   GFRAA 68 05/30/2020 02:19 PM    BMP Latest Ref Rng & Units 01/09/2021 11/29/2020 05/30/2020  Glucose 70 - 99 mg/dL 131(H) 129(H) 105(H)  BUN 6 - 23 mg/dL 27(H) 23 18  Creatinine 0.40 - 1.20 mg/dL 1.00 1.07 0.89(H)  BUN/Creat Ratio 6 - 22 (calc) - - 20  Sodium 135 - 145 mEq/L 138 137 141  Potassium 3.5 - 5.1 mEq/L 4.5 4.1 4.4  Chloride 96 - 112 mEq/L 100 99 100  CO2 19 - 32 mEq/L 29 26 29   Calcium 8.4 - 10.5 mg/dL 9.7 9.5 9.8   Current antihypertensive regimen:  Amlodipine 10 mg once daily  Atenolol 100 mg once daily  Hydrochlorothiazide 25 mg once daily Losartan 100 mg once daily  How often are you checking your Blood Pressure? Patient did not offer information for at home readings. She referred to her OV with cardiology as the recent b/p readings  Current home BP readings:  None given  What recent interventions/DTPs have been made by any provider to improve Blood Pressure control since last CPP Visit: No recent interventions  Any recent hospitalizations or ED visits since last visit with CPP? No  What diet changes have been  made to improve Blood Pressure Control?  Patient  Reported No changes  What exercise is being done to improve your Blood Pressure Control?  No exercise reported  Adherence Review: Is the patient currently on ACE/ARB medication? Yes Does the patient have >5 day gap between last estimated fill dates? No  Amlodipine 10 mg last filled 11/13/20 90 DS Atenolol 100 mg  Last filled 12/23/20  90DS Hydrochlorothiazide 25 mg last filled 11/20/20 90DS  Star Rating Drugs: Losartan 100mg   Last filled 11/23/20 90 DS  Wilford Sports CPA, CMA

## 2021-01-28 ENCOUNTER — Encounter (HOSPITAL_COMMUNITY): Payer: Self-pay | Admitting: *Deleted

## 2021-02-18 NOTE — Progress Notes (Signed)
    Chronic Care Management Pharmacy Assistant   Name: Ariabella Brien Glockner  MRN: [id number] DOB: Dec 21, 1930  Reason for Encounter: Chart Review   Medications: Outpatient Encounter Medications as of 01/24/2021  Medication Sig  . amLODipine (NORVASC) 10 MG tablet Take 1 tablet (10 mg total) by mouth daily.  Marland Kitchen apixaban (ELIQUIS) 5 MG TABS tablet Take 1 tablet (5 mg total) by mouth 2 (two) times daily.  Marland Kitchen atenolol (TENORMIN) 100 MG tablet Take 1 tablet (100 mg total) by mouth daily.  . hydrochlorothiazide (HYDRODIURIL) 25 MG tablet Take 1 tablet (25 mg total) by mouth daily.  Marland Kitchen losartan (COZAAR) 100 MG tablet Take 1 tablet (100 mg total) by mouth daily.  . naproxen sodium (ALEVE) 220 MG tablet Take 220 mg by mouth daily as needed (pain).   No facility-administered encounter medications on file as of 01/24/2021.   Reviewed chart for medication changes and adherence.  No OVs, Consults, or hospital visits since last care coordination call / Pharmacist visit. No medication changes indicated  No gaps in adherence identified. Patient has follow up scheduled with pharmacy team. No further action required.  Wilford Sports CPA, CMA

## 2021-02-25 ENCOUNTER — Other Ambulatory Visit: Payer: PPO

## 2021-02-25 ENCOUNTER — Ambulatory Visit: Payer: PPO | Admitting: Hematology and Oncology

## 2021-04-24 ENCOUNTER — Other Ambulatory Visit: Payer: Self-pay

## 2021-04-24 ENCOUNTER — Encounter (HOSPITAL_COMMUNITY): Payer: Self-pay | Admitting: Physician Assistant

## 2021-04-24 ENCOUNTER — Ambulatory Visit (HOSPITAL_COMMUNITY)
Admission: RE | Admit: 2021-04-24 | Discharge: 2021-04-24 | Disposition: A | Discharge status: Home / Self Care | Payer: PPO | Source: Ambulatory Visit | Attending: Physician Assistant | Admitting: Physician Assistant

## 2021-04-24 VITALS — BP 140/70 | HR 73 | Ht 62.0 in | Wt 154.8 lb

## 2021-04-24 DIAGNOSIS — Z87891 Personal history of nicotine dependence: Secondary | ICD-10-CM | POA: Insufficient documentation

## 2021-04-24 DIAGNOSIS — Z79899 Other long term (current) drug therapy: Secondary | ICD-10-CM | POA: Diagnosis not present

## 2021-04-24 DIAGNOSIS — N189 Chronic kidney disease, unspecified: Secondary | ICD-10-CM | POA: Diagnosis not present

## 2021-04-24 DIAGNOSIS — D6869 Other thrombophilia: Secondary | ICD-10-CM | POA: Insufficient documentation

## 2021-04-24 DIAGNOSIS — Z09 Encounter for follow-up examination after completed treatment for conditions other than malignant neoplasm: Secondary | ICD-10-CM | POA: Insufficient documentation

## 2021-04-24 DIAGNOSIS — Z7901 Long term (current) use of anticoagulants: Secondary | ICD-10-CM | POA: Diagnosis not present

## 2021-04-24 DIAGNOSIS — I129 Hypertensive chronic kidney disease with stage 1 through stage 4 chronic kidney disease, or unspecified chronic kidney disease: Secondary | ICD-10-CM | POA: Diagnosis not present

## 2021-04-24 DIAGNOSIS — I4821 Permanent atrial fibrillation: Secondary | ICD-10-CM | POA: Diagnosis not present

## 2021-04-24 NOTE — Progress Notes (Signed)
Primary Care Physician: [name], MD Primary Cardiologist: none Primary Electrophysiologist: none Referring Physician: Dr Maria Lowe is a 87 y.o. female with a history of CLL, HTN, CKD, HLD, and atrial fibrillation who presents for follow up in the [facility].  The patient was initially diagnosed with atrial fibrillation 11/29/20 after presenting to her PCP for follow up. Her heart rate was irregular on auscultation and ECG showed rate controlled afib. Patient was started on Eliquis for a CHADS2VASC score of 4. She reports no symptoms with her arrhythmia.   On follow up today, patient reports that she has done well since her last visit. She very occasionally will have palpitations but she states these are not bothersome. She denies any bleeding issues on anticoagulation. Echo showed preserved EF 60-65%, mod MR.  Today, she denies symptoms of chest pain, shortness of breath, orthopnea, PND, lower extremity edema, dizziness, presyncope, syncope, snoring, daytime somnolence, bleeding, or neurologic sequela. The patient is tolerating medications without difficulties and is otherwise without complaint today.    Atrial Fibrillation Risk Factors:  she does not have symptoms or diagnosis of sleep apnea. she does not have a history of rheumatic fever. she does not have a history of alcohol use.   she has a BMI of Body mass index is 28.31 kg/m.Marland Kitchen Filed Weights   04/24/21 0955  Weight: 70.2 kg    Family History  Problem Relation Age of Onset   Healthy Mother        died age 38 (communist country could not get records)   Other Father        died young-unclear cause     Atrial Fibrillation Management history:  Previous antiarrhythmic drugs: none Previous cardioversions: none Previous ablations: none CHADS2VASC score: 4 Anticoagulation history: Eliquis   Past Medical History:  Diagnosis Date   Arthritis    knee   CLL (chronic  lymphocytic leukemia) ([place]) 11/27/2013   Hypertension    Ulcer    stomach ulcer when young   Past Surgical History:  Procedure Laterality Date   APPENDECTOMY     catarat Right 07/23/2019   catarat Left 08/31/2019   TONSILLECTOMY AND ADENOIDECTOMY      Current Outpatient Medications  Medication Sig Dispense Refill   amLODipine (NORVASC) 10 MG tablet Take 1 tablet (10 mg total) by mouth daily. 90 tablet 3   apixaban (ELIQUIS) 5 MG TABS tablet Take 1 tablet (5 mg total) by mouth 2 (two) times daily. 60 tablet 5   atenolol (TENORMIN) 100 MG tablet Take 1 tablet (100 mg total) by mouth daily. 90 tablet 3   hydrochlorothiazide (HYDRODIURIL) 25 MG tablet Take 1 tablet (25 mg total) by mouth daily. 90 tablet 3   losartan (COZAAR) 100 MG tablet Take 1 tablet (100 mg total) by mouth daily. 90 tablet 3   naproxen sodium (ALEVE) 220 MG tablet Take 220 mg by mouth daily as needed (pain).     No current facility-administered medications for this encounter.    No Known Allergies  Social History   Socioeconomic History   Marital status: Married    Spouse name: Not on file   Number of children: Not on file   Years of education: Not on file   Highest education level: Not on file  Occupational History   Occupation: retired  Tobacco Use   Smoking status: Former    Packs/day: 0.20    Years: 20.00    Pack years:  4.00    Types: Cigarettes    Quit date: 09/21/1978    Years since quitting: 42.6   Smokeless tobacco: Never  Substance and Sexual Activity   Alcohol use: Not Currently    Alcohol/week: 7.0 standard drinks    Types: 7 Standard drinks or equivalent per week   Drug use: No   Sexual activity: Not on file  Other Topics Concern   Not on file  Social History Narrative   Widowed April 2016. 3 children. 7 grandkids. 3 greatgrandkids.   Husband worked as Chief Executive Officer.       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      Hobbies: travel- husband was  buried at home ([place])   Social Determinants of Radio broadcast assistant Strain: Low Risk    Difficulty of Paying Living Expenses: Not hard at all  Food Insecurity: No Food Insecurity   Worried About Charity fundraiser in the Last Year: Never true   Arboriculturist in the Last Year: Never true  Transportation Needs: No Transportation Needs   Lack of Transportation (Medical): No   Lack of Transportation (Non-Medical): No  Physical Activity: Insufficiently Active   Days of Exercise per Week: 2 days   Minutes of Exercise per Session: 20 min  Stress: No Stress Concern Present   Feeling of Stress : Not at all  Social Connections: Socially Isolated   Frequency of Communication with Friends and Family: More than three times a week   Frequency of Social Gatherings with Friends and Family: Never   Attends Religious Services: Never   Marine scientist or Organizations: No   Attends Archivist Meetings: Never   Marital Status: Widowed  Human resources officer Violence: Not At Risk   Fear of Current or Ex-Partner: No   Emotionally Abused: No   Physically Abused: No   Sexually Abused: No     ROS- All systems are reviewed and negative except as per the HPI above.  Physical Exam: Vitals:   04/24/21 0955  BP: 140/70  Pulse: 73  Weight: 70.2 kg  Height: '5\' 2"'$  (1.575 m)   GEN- The patient is a well appearing elderly female, alert and oriented x 3 today.   HEENT-head normocephalic, atraumatic, sclera clear, conjunctiva pink, hearing intact, trachea midline. Lungs- Clear to ausculation bilaterally, normal work of breathing Heart- irregular rate and rhythm, no murmurs, rubs or gallops  GI- soft, NT, ND, + BS Extremities- no clubbing, cyanosis, or edema MS- no significant deformity or atrophy Skin- no rash or lesion Psych- euthymic mood, full affect Neuro- strength and sensation are intact   Wt Readings from Last 3 Encounters:  04/24/21 70.2 kg  01/23/21 71.1 kg   12/10/20 72.2 kg    EKG today demonstrates  Afib Vent. rate 73 BPM PR interval * ms QRS duration 114 ms QT/QTcB 372/409 ms  Echo 01/23/21 1. Left ventricular ejection fraction, by estimation, is 60 to 65%. The  left ventricle has normal function. The left ventricle has no regional  wall motion abnormalities. Left ventricular diastolic parameters are  indeterminate.   2. Right ventricular systolic function is normal. The right ventricular  size is normal. There is normal pulmonary artery systolic pressure.   3. Left atrial size was mildly dilated.   4. The mitral valve is normal in structure. Moderate mitral valve  regurgitation. No evidence of mitral stenosis.   5. The aortic valve is normal in structure. Aortic  valve regurgitation is  trivial. No aortic stenosis is present.   6. The inferior vena cava is normal in size with greater than 50%  respiratory variability, suggesting right atrial pressure of 3 mmHg.   Epic records are reviewed at length today  CHA2DS2-VASc Score = 4  The patient's score is based upon: CHF History: No HTN History: Yes Diabetes History: No Stroke History: No Vascular Disease History: No Age Score: 2 Gender Score: 1      ASSESSMENT AND PLAN: 1. Permanent atrial fibrillation  The patient's CHA2DS2-VASc score is 4, indicating a 4.8% annual risk of stroke.   Patient remains rate controlled and asymptomatic.  Recall patient would like to pursue a rate control strategy. She does not want a DCCV.  Continue Eliquis 5 mg BID (weight >60kg Cr <1.5) Continue atenolol 100 mg daily  2. Secondary Hypercoagulable State (ICD10:  D68.69) The patient is at significant risk for stroke/thromboembolism based upon her CHA2DS2-VASc Score of 4.  Continue Apixaban (Eliquis).   3. HTN Stable, no changes today.  4. MR Moderate on echo. Patient states she has a history of this >15 years.    Follow up in the AF clinic as needed. Patient to f/u with PCP.     [facility] [address] [place], [place] [address] [phone] 04/24/2021 10:04 AM

## 2021-05-12 ENCOUNTER — Ambulatory Visit (INDEPENDENT_AMBULATORY_CARE_PROVIDER_SITE_OTHER): Payer: PPO | Admitting: Pharmacist

## 2021-05-12 ENCOUNTER — Other Ambulatory Visit: Payer: Self-pay | Admitting: Family Medicine

## 2021-05-12 DIAGNOSIS — E785 Hyperlipidemia, unspecified: Secondary | ICD-10-CM

## 2021-05-12 DIAGNOSIS — I10 Essential (primary) hypertension: Secondary | ICD-10-CM

## 2021-05-12 DIAGNOSIS — I4819 Other persistent atrial fibrillation: Secondary | ICD-10-CM

## 2021-05-12 NOTE — Patient Instructions (Addendum)
Visit Information   Goals Addressed             This Visit's Progress    Track and Manage My Blood Pressure-Hypertension       Timeframe:  Long-Range Goal Priority:  High Start Date: 05/12/21                            Expected End Date:  11/12/21                     Follow Up Date 08/20/21    - check blood pressure weekly - choose a place to take my blood pressure (home, clinic or office, retail store) - write blood pressure results in a log or diary    Why is this important?   You won't feel high blood pressure, but it can still hurt your blood vessels.  High blood pressure can cause heart or kidney problems. It can also cause a stroke.  Making lifestyle changes like losing a little weight or eating less salt will help.  Checking your blood pressure at home and at different times of the day can help to control blood pressure.  If the doctor prescribes medicine remember to take it the way the doctor ordered.  Call the office if you cannot afford the medicine or if there are questions about it.     Notes:        Patient Care Plan: General Pharmacy (Adult)     Problem Identified: HTN, HLD, Afib   Priority: High  Onset Date: 05/12/2021     Long-Range Goal: Patient-Specific Goal   Start Date: 05/12/2021  Expected End Date: 11/12/2021  This Visit's Progress: On track  Priority: High  Note:   Current Barriers:  None at this time  Pharmacist Clinical Goal(s):  Patient will maintain control of BP and Afib as evidenced by symptom level  contact provider office for questions/concerns as evidenced notation of same in electronic health record through collaboration with PharmD and provider.   Interventions: 1:1 collaboration with [name], MD regarding development and update of comprehensive plan of care as evidenced by provider attestation and co-signature Inter-disciplinary care team collaboration (see longitudinal plan of care) Comprehensive medication review  performed; medication list updated in electronic medical record  Hypertension (BP goal <130/80) -Controlled -Current treatment: Amlodipine '10mg'$  daily Atenolol '100mg'$  daily HCTZ '25mg'$  daily Losartan '100mg'$  daily -Medications previously tried: none noted  -Current home readings: 120/70s normally  -Denies hypotensive/hypertensive symptoms -Educated on BP goals and benefits of medications for prevention of heart attack, stroke and kidney damage; Importance of home blood pressure monitoring; Symptoms of hypotension and importance of maintaining adequate hydration; -Counseled to monitor BP at home weekly, document, and provide log at future appointments -Recommended to continue current medication  Hyperlipidemia: (LDL goal < 100) -Not ideally controlled -Current treatment: None -Medications previously tried: none noted  -LDL is above goal, however due to age she is not on statin -Educated on Cholesterol goals;  Importance of limiting foods high in cholesterol; -Recommended continue current management strategies.  Atrial Fibrillation (Goal: prevent stroke and major bleeding) -Controlled -CHADSVASC: 4 -Current treatment: Rate control: none Anticoagulation: Eliquis '5mg'$  BID -Medications previously tried: none noted -Home BP and HR readings: well controlled  -Counseled on increased risk of stroke due to Afib and benefits of anticoagulation for stroke prevention; bleeding risk associated with Eliquis and importance of self-monitoring for signs/symptoms of bleeding; avoidance of NSAIDs  due to increased bleeding risk with anticoagulants; -Recommended to continue current medication Assessed patient finances. Her copay is currently $40 which she states is manageable at this time for her.    Patient Goals/Self-Care Activities Patient will:  - take medications as prescribed check blood pressure periodically, document, and provide at future appointments  Follow Up Plan: The care  management team will reach out to the patient again over the next 180 days.         Patient verbalizes understanding of instructions provided today and agrees to view in [place].  Telephone follow up appointment with pharmacy team member scheduled for: 6 months  Edythe Clarity, [place]

## 2021-05-12 NOTE — Progress Notes (Signed)
Chronic Care Management Pharmacy Note  05/12/2021 Name:  Maria Lowe MRN:  [id number] DOB:  [birth year]  Summary: PharmD follow up.  Patient doing very well overall for her age.  Recently started Eliquis and having no concerns at this time.  All meds reviewed and updated.    Recommendations/Changes made from today's visit: None at this time  Plan: FU 6 months   Subjective: Maria Lowe is an 87 y.o. year old female who is a primary patient of Hunter, Brayton Mars, MD.  The CCM team was consulted for assistance with disease management and care coordination needs.    Engaged with patient by telephone for follow up visit in response to provider referral for pharmacy case management and/or care coordination services.   Consent to Services:  The patient was given the following information about Chronic Care Management services today, agreed to services, and gave verbal consent: 1. CCM service includes personalized support from designated clinical staff supervised by the primary care provider, including individualized plan of care and coordination with other care providers 2. 24/7 contact phone numbers for assistance for urgent and routine care needs. 3. Service will only be billed when office clinical staff spend 20 minutes or more in a month to coordinate care. 4. Only one practitioner may furnish and bill the service in a calendar month. 5.The patient may stop CCM services at any time (effective at the end of the month) by phone call to the office staff. 6. The patient will be responsible for cost sharing (co-pay) of up to 20% of the service fee (after annual deductible is met). Patient agreed to services and consent obtained.  Patient Care Team: [name], MD as PCP - General (Family Medicine) Heath Lark, MD as Consulting Physician (Hematology and Oncology) Edythe Clarity, [facility] (Pharmacist)  Recent office visits: None since last Ccm call  Recent consult visits: 04/24/21  Marlene Lard, Cardiology) - no changes to medication, refular Afib follow up  Hospital visits: None in previous 6 months   Objective:  Lab Results  Component Value Date   CREATININE 1.00 01/09/2021   BUN 27 (H) 01/09/2021   GFR 50.64 (L) 01/09/2021   GFRNONAA 59 (L) 05/30/2020   GFRAA 68 05/30/2020   NA 138 01/09/2021   K 4.5 01/09/2021   CALCIUM 9.7 01/09/2021   CO2 29 01/09/2021   GLUCOSE 131 (H) 01/09/2021    Lab Results  Component Value Date/Time   HGBA1C 5.9 11/29/2020 10:23 AM   HGBA1C 5.5 05/30/2020 02:19 PM   GFR 50.64 (L) 01/09/2021 09:33 AM   GFR 46.73 (L) 11/29/2020 10:23 AM    Last diabetic Eye exam: No results found for: HMDIABEYEEXA  Last diabetic Foot exam: No results found for: HMDIABFOOTEX   Lab Results  Component Value Date   CHOL 212 (H) 05/30/2020   HDL 69 05/30/2020   LDLCALC 113 (H) 05/30/2020   TRIG 189 (H) 05/30/2020   CHOLHDL 3.1 05/30/2020    Hepatic Function Latest Ref Rng & Units 01/09/2021 11/29/2020 05/30/2020  Total Protein 6.0 - 8.3 g/dL 6.4 6.4 6.3  Albumin 3.5 - 5.2 g/dL 4.3 4.5 -  AST 0 - 37 U/L 30 43(H) 19  ALT 0 - 35 U/L 28 41(H) 13  Alk Phosphatase 39 - 117 U/L 61 78 -  Total Bilirubin 0.2 - 1.2 mg/dL 0.8 1.0 0.6  Bilirubin, Direct 0.0 - 0.3 mg/dL - - -    Lab Results  Component Value Date/Time   TSH 1.31  04/19/2017 04:25 PM    CBC Latest Ref Rng & Units 01/23/2021 11/29/2020 05/30/2020  WBC 4.0 - 10.5 K/uL 21.4(H) 17.7(H) 26.7(H)  Hemoglobin 12.0 - 15.0 g/dL 14.7 14.0 15.0  Hematocrit 36.0 - 46.0 % 46.2(H) 42.4 46.2(H)  Platelets 150 - 400 K/uL 115(L) 118.0(L) 125(L)    No results found for: VD25OH  Clinical ASCVD: No  The ASCVD Risk score Mikey Bussing DC Jr., et al., 2013) failed to calculate for the following reasons:   The 2013 ASCVD risk score is only valid for ages 82 to 72    Depression screen PHQ 2/9 11/29/2020 06/17/2020 05/30/2020  Decreased Interest 0 0 0  Down, Depressed, Hopeless 0 0 0  PHQ - 2 Score 0 0 0  Altered  sleeping - - 0  Tired, decreased energy - - 0  Change in appetite - - 0  Feeling bad or failure about yourself  - - 0  Trouble concentrating - - 0  Moving slowly or fidgety/restless - - 0  Suicidal thoughts - - 0  PHQ-9 Score - - 0  Difficult doing work/chores - - Not difficult at all     Social History   Tobacco Use  Smoking Status Former   Packs/day: 0.20   Years: 20.00   Pack years: 4.00   Types: Cigarettes   Quit date: 09/21/1978   Years since quitting: 42.6  Smokeless Tobacco Never   BP Readings from Last 3 Encounters:  04/24/21 140/70  01/23/21 126/72  12/10/20 112/64   Pulse Readings from Last 3 Encounters:  04/24/21 73  01/23/21 89  12/10/20 94   Wt Readings from Last 3 Encounters:  04/24/21 154 lb 12.8 oz (70.2 kg)  01/23/21 156 lb 12.8 oz (71.1 kg)  12/10/20 159 lb 3.2 oz (72.2 kg)   BMI Readings from Last 3 Encounters:  04/24/21 28.31 kg/m  01/23/21 28.68 kg/m  12/10/20 29.12 kg/m    Assessment/Interventions: Review of patient past medical history, allergies, medications, health status, including review of consultants reports, laboratory and other test data, was performed as part of comprehensive evaluation and provision of chronic care management services.   SDOH:  (Social Determinants of Health) assessments and interventions performed: Yes  Financial Resource Strain: Low Risk    Difficulty of Paying Living Expenses: Not hard at all    SDOH Screenings   Alcohol Screen: Not on file  Depression (PHQ2-9): Low Risk    PHQ-2 Score: 0  Financial Resource Strain: Low Risk    Difficulty of Paying Living Expenses: Not hard at all  Food Insecurity: No Food Insecurity   Worried About Charity fundraiser in the Last Year: Never true   Arboriculturist in the Last Year: Never true  Housing: Low Risk    Last Housing Risk Score: 0  Physical Activity: Insufficiently Active   Days of Exercise per Week: 2 days   Minutes of Exercise per Session: 20 min   Social Connections: Socially Isolated   Frequency of Communication with Friends and Family: More than three times a week   Frequency of Social Gatherings with Friends and Family: Never   Attends Religious Services: Never   Marine scientist or Organizations: No   Attends Archivist Meetings: Never   Marital Status: Widowed  Stress: No Stress Concern Present   Feeling of Stress : Not at all  Tobacco Use: Medium Risk   Smoking Tobacco Use: Former   Smokeless Tobacco Use: Never  Transportation Needs: No  Transportation Needs   Lack of Transportation (Medical): No   Lack of Transportation (Non-Medical): No    CCM Care Plan  No Known Allergies  Medications Reviewed Today     Reviewed by Edythe Clarity, [facility] (Pharmacist) on 05/12/21 at 1352  Med List Status: <None>   Medication Order Taking? Sig Documenting Provider Last Dose Status Informant  amLODipine (NORVASC) 10 MG tablet [id number]  TAKE 1 TABLET BY MOUTH EVERY DAY [name], MD  Active   apixaban (ELIQUIS) 5 MG TABS tablet [id number] No Take 1 tablet (5 mg total) by mouth 2 (two) times daily. [name], MD Taking Active   atenolol (TENORMIN) 100 MG tablet [id number] No Take 1 tablet (100 mg total) by mouth daily. [name], MD Taking Active   hydrochlorothiazide (HYDRODIURIL) 25 MG tablet [id number] No Take 1 tablet (25 mg total) by mouth daily. [name], MD Taking Active   losartan (COZAAR) 100 MG tablet [id number] No Take 1 tablet (100 mg total) by mouth daily. [name], MD Taking Active   naproxen sodium (ALEVE) 220 MG tablet [id number] No Take 220 mg by mouth daily as needed (pain). [provider] Taking Active             Patient Active Problem List   Diagnosis Date Noted   Permanent atrial fibrillation ([place]) 04/24/2021   Secondary hypercoagulable state ([place]) 12/10/2020   Persistent atrial fibrillation ([place]) 11/29/2020   Hyperlipidemia, unspecified  04/07/2018   CKD (chronic kidney disease), stage III ([place]) 02/09/2018   Rash 02/09/2018   Seborrheic dermatitis 01/08/2017   Former smoker 09/30/2015   Uterine prolapse 03/29/2015   Thrombocytopenia ([place]) 11/26/2014   CLL (chronic lymphocytic leukemia) ([place]) 11/27/2013   Hyperglycemia 05/08/2013   Essential hypertension, benign 11/07/2012   Obesity, unspecified 11/07/2012    Immunization History  Administered Date(s) Administered   Influenza Whole 06/30/2013   Influenza, High Dose Seasonal PF 06/25/2016, 06/21/2019   Influenza,inj,Quad PF,6+ Mos 06/13/2014   Influenza-Unspecified 07/12/2015, 06/09/2017, 06/21/2020   PFIZER Comirnaty(Gray Top)Covid-19 Tri-Sucrose Vaccine 01/30/2021   PFIZER(Purple Top)SARS-COV-2 Vaccination 12/13/2019, 12/30/2019, 07/12/2020   Pneumococcal Conjugate-13 06/25/2016   Pneumococcal Polysaccharide-23 06/21/2013   Td 04/07/2018    Conditions to be addressed/monitored:  HTN, HLD, Hyperglycemia  Care Plan : General Pharmacy (Adult)  Updates made by Edythe Clarity, RPH since 05/12/2021 12:00 AM     Problem: HTN, HLD, Afib   Priority: High  Onset Date: 05/12/2021     Long-Range Goal: Patient-Specific Goal   Start Date: 05/12/2021  Expected End Date: 11/12/2021  This Visit's Progress: On track  Priority: High  Note:   Current Barriers:  None at this time  Pharmacist Clinical Goal(s):  Patient will maintain control of BP and Afib as evidenced by symptom level  contact provider office for questions/concerns as evidenced notation of same in electronic health record through collaboration with PharmD and provider.   Interventions: 1:1 collaboration with [name], MD regarding development and update of comprehensive plan of care as evidenced by provider attestation and co-signature Inter-disciplinary care team collaboration (see longitudinal plan of care) Comprehensive medication review performed; medication list updated in electronic  medical record  Hypertension (BP goal <130/80) -Controlled -Current treatment: Amlodipine 69m daily Atenolol 1030mdaily HCTZ 2541maily Losartan 100m10mily -Medications previously tried: none noted  -Current home readings: 120/70s normally  -Denies hypotensive/hypertensive symptoms -Educated on BP goals and benefits of medications for prevention of heart attack, stroke and kidney damage; Importance  of home blood pressure monitoring; Symptoms of hypotension and importance of maintaining adequate hydration; -Counseled to monitor BP at home weekly, document, and provide log at future appointments -Recommended to continue current medication  Hyperlipidemia: (LDL goal < 100) -Not ideally controlled -Current treatment: None -Medications previously tried: none noted  -LDL is above goal, however due to age she is not on statin -Educated on Cholesterol goals;  Importance of limiting foods high in cholesterol; -Recommended continue current management strategies.  Atrial Fibrillation (Goal: prevent stroke and major bleeding) -Controlled -CHADSVASC: 4 -Current treatment: Rate control: none Anticoagulation: Eliquis 8m BID -Medications previously tried: none noted -Home BP and HR readings: well controlled  -Counseled on increased risk of stroke due to Afib and benefits of anticoagulation for stroke prevention; bleeding risk associated with Eliquis and importance of self-monitoring for signs/symptoms of bleeding; avoidance of NSAIDs due to increased bleeding risk with anticoagulants; -Recommended to continue current medication Assessed patient finances. Her copay is currently $40 which she states is manageable at this time for her.    Patient Goals/Self-Care Activities Patient will:  - take medications as prescribed check blood pressure periodically, document, and provide at future appointments  Follow Up Plan: The care management team will reach out to the patient again over  the next 180 days.         Medication Assistance: None required.  Patient affirms current coverage meets needs.  Compliance/Adherence/Medication fill history: Care Gaps: None at this time  Star-Rating Drugs: Losartan 1040m06/06/22 90ds  Patient's preferred pharmacy is:   We discussed: Benefits of medication synchronization, packaging and delivery as well as enhanced pharmacist oversight with [facility]. Patient decided to: Continue current medication management strategy  Care Plan and Follow Up Patient Decision:  Patient agrees to Care Plan and Follow-up.  Plan: The care management team will reach out to the patient again over the next 180 days.  ChBeverly MilchPharmD Clinical Pharmacist ([phone]

## 2021-05-20 ENCOUNTER — Other Ambulatory Visit: Payer: Self-pay | Admitting: Family Medicine

## 2021-05-21 ENCOUNTER — Other Ambulatory Visit: Payer: Self-pay | Admitting: Family Medicine

## 2021-05-21 DIAGNOSIS — E785 Hyperlipidemia, unspecified: Secondary | ICD-10-CM | POA: Diagnosis not present

## 2021-05-21 DIAGNOSIS — I10 Essential (primary) hypertension: Secondary | ICD-10-CM | POA: Diagnosis not present

## 2021-05-21 DIAGNOSIS — I4819 Other persistent atrial fibrillation: Secondary | ICD-10-CM

## 2021-05-27 ENCOUNTER — Other Ambulatory Visit: Payer: Self-pay | Admitting: Family Medicine

## 2021-05-29 ENCOUNTER — Telehealth: Payer: Self-pay

## 2021-05-29 NOTE — Telephone Encounter (Signed)
Patient Name: Maria Lowe Gender: Female DOB: [birth year] Age: 87 Y 11 M 7 D Return Phone Number: [phone] (Primary) Address: City/ State/ Zip: [place]  [address] [facility] [place] [facility] [place] [facility] Phys[facility] [name]- MD Contact Type Call Who Is Calling Patient / Member / Family / Caregiver Call Type Triage / Clinical Relationship To Patient Self Return Phone Number [phone] (Primary) Chief Complaint Headache Reason for Call Symptomatic / Request for [place] states, patient needs triage. has a headache, body ache, No COVID. Translation No Nurse Assessment Nurse: Raphael Gibney, RN, [place] Date/Time (Eastern Time): 05/29/2021 9:16:00 AM Confirm and document reason for call. If symptomatic, describe symptoms. ---caller states she has a headache. she feels shaky. ears are ringing. she feels lightheaded. Her hands feels cold. can not find her thermometer. Not exposed to [place]. BP 119/80. Does the patient have any new or worsening symptoms? ---Yes Will a triage be completed? ---Yes Related visit to physician within the last 2 weeks? ---No Does the PT have any chronic conditions? (i.e. diabetes, asthma, this includes High risk factors for pregnancy, etc.) ---Yes List chronic conditions. ---HTN Is this a behavioral health or substance abuse call? ---No Guidelines Guideline Title Affirmed Question Affirmed Notes Nurse Date/Time (Eastern Time) Headache [1] New headache AND [2] age > 53 Raphael Gibney, RN, Vera 05/29/2021 9:19:25 AM Disp. Time Eilene Ghazi Time) Disposition Final User 05/29/2021 9:24:00 AM See PCP within 24 Hours Yes Raphael Gibney, RN, Vanita Ingles PLEASE NOTE: All timestamps contained within this report are represented as [place] Standard Time. CONFIDENTIALTY NOTICE: This fax transmission is intended only for the addressee. It contains information that is  legally privileged, confidential or otherwise protected from use or disclosure. If you are not the intended recipient, you are strictly prohibited from reviewing, disclosing, copying using or disseminating any of this information or taking any action in reliance on or regarding this information. If you have received this fax in error, please notify us immediately by telephone so that we can arrange for its return to [place]. Phone: [phone], Toll-Free: [phone], Fax: [phone] Page: 2 of 2 Call Id: [id number] Caller Disagree/Comply Comply Caller Understands Yes PreDisposition Call Doctor Care Advice Given Per Guideline SEE PCP WITHIN 24 HOURS: * IF OFFICE WILL BE OPEN: You need to be examined within the next 24 hours. Call your doctor (or NP/PA) when the office opens and make an appointment. CALL BACK IF: * Blurred vision or double vision occurs * Numbness or weakness of the face, arm or leg * You become worse * Difficulty with speaking Comments User: Dannielle Burn, RN Date/Time Eilene Ghazi Time): 05/29/2021 9:26:41 AM called office and gave report that pt has a headache and is a little lightheaded. Triage outcome see physician within 24 hrs. warm transferred pt to the office Referrals REFERRED TO PCP OFFICE

## 2021-05-29 NOTE — Telephone Encounter (Signed)
Pt seeing Alyssa tomorrow.

## 2021-05-30 ENCOUNTER — Encounter (HOSPITAL_BASED_OUTPATIENT_CLINIC_OR_DEPARTMENT_OTHER): Payer: Self-pay | Admitting: Emergency Medicine

## 2021-05-30 ENCOUNTER — Other Ambulatory Visit: Payer: Self-pay

## 2021-05-30 ENCOUNTER — Emergency Department (HOSPITAL_BASED_OUTPATIENT_CLINIC_OR_DEPARTMENT_OTHER): Payer: PPO | Admitting: Radiology

## 2021-05-30 ENCOUNTER — Encounter: Payer: Self-pay | Admitting: Physician Assistant

## 2021-05-30 ENCOUNTER — Ambulatory Visit (INDEPENDENT_AMBULATORY_CARE_PROVIDER_SITE_OTHER): Payer: PPO | Admitting: Physician Assistant

## 2021-05-30 ENCOUNTER — Emergency Department (HOSPITAL_BASED_OUTPATIENT_CLINIC_OR_DEPARTMENT_OTHER): Payer: PPO

## 2021-05-30 ENCOUNTER — Emergency Department (HOSPITAL_BASED_OUTPATIENT_CLINIC_OR_DEPARTMENT_OTHER)
Admission: EM | Admit: 2021-05-30 | Discharge: 2021-05-30 | Disposition: A | Discharge status: Home / Self Care | Payer: PPO | Attending: Emergency Medicine | Admitting: Emergency Medicine

## 2021-05-30 VITALS — BP 139/72 | HR 84 | Temp 98.3°F | Ht 62.0 in | Wt 150.5 lb

## 2021-05-30 DIAGNOSIS — I129 Hypertensive chronic kidney disease with stage 1 through stage 4 chronic kidney disease, or unspecified chronic kidney disease: Secondary | ICD-10-CM | POA: Insufficient documentation

## 2021-05-30 DIAGNOSIS — R42 Dizziness and giddiness: Secondary | ICD-10-CM

## 2021-05-30 DIAGNOSIS — Z87891 Personal history of nicotine dependence: Secondary | ICD-10-CM | POA: Diagnosis not present

## 2021-05-30 DIAGNOSIS — Z20822 Contact with and (suspected) exposure to covid-19: Secondary | ICD-10-CM | POA: Diagnosis not present

## 2021-05-30 DIAGNOSIS — R531 Weakness: Secondary | ICD-10-CM | POA: Insufficient documentation

## 2021-05-30 DIAGNOSIS — Z7901 Long term (current) use of anticoagulants: Secondary | ICD-10-CM

## 2021-05-30 DIAGNOSIS — R519 Headache, unspecified: Secondary | ICD-10-CM

## 2021-05-30 DIAGNOSIS — R251 Tremor, unspecified: Secondary | ICD-10-CM | POA: Diagnosis not present

## 2021-05-30 DIAGNOSIS — N183 Chronic kidney disease, stage 3 unspecified: Secondary | ICD-10-CM | POA: Diagnosis not present

## 2021-05-30 DIAGNOSIS — R4182 Altered mental status, unspecified: Secondary | ICD-10-CM | POA: Diagnosis not present

## 2021-05-30 DIAGNOSIS — I4891 Unspecified atrial fibrillation: Secondary | ICD-10-CM | POA: Diagnosis not present

## 2021-05-30 DIAGNOSIS — Z79899 Other long term (current) drug therapy: Secondary | ICD-10-CM | POA: Insufficient documentation

## 2021-05-30 LAB — URINALYSIS, ROUTINE W REFLEX MICROSCOPIC
Bilirubin Urine: NEGATIVE
Glucose, UA: NEGATIVE mg/dL
Hgb urine dipstick: NEGATIVE
Ketones, ur: NEGATIVE mg/dL
Leukocytes,Ua: NEGATIVE
Nitrite: NEGATIVE
Protein, ur: NEGATIVE mg/dL
Specific Gravity, Urine: 1.009 (ref 1.005–1.030)
pH: 5.5 (ref 5.0–8.0)

## 2021-05-30 LAB — CBC WITH DIFFERENTIAL/PLATELET
Abs Immature Granulocytes: 0.03 10*3/uL (ref 0.00–0.07)
Basophils Absolute: 0.1 10*3/uL (ref 0.0–0.1)
Basophils Relative: 0 %
Eosinophils Absolute: 0.1 10*3/uL (ref 0.0–0.5)
Eosinophils Relative: 0 %
HCT: 43.4 % (ref 36.0–46.0)
Hemoglobin: 14.3 g/dL (ref 12.0–15.0)
Immature Granulocytes: 0 %
Lymphocytes Relative: 65 %
Lymphs Abs: 11 10*3/uL — ABNORMAL HIGH (ref 0.7–4.0)
MCH: 28.7 pg (ref 26.0–34.0)
MCHC: 32.9 g/dL (ref 30.0–36.0)
MCV: 87.1 fL (ref 80.0–100.0)
Monocytes Absolute: 0.5 10*3/uL (ref 0.1–1.0)
Monocytes Relative: 3 %
Neutro Abs: 5.4 10*3/uL (ref 1.7–7.7)
Neutrophils Relative %: 32 %
Platelets: 124 10*3/uL — ABNORMAL LOW (ref 150–400)
RBC: 4.98 MIL/uL (ref 3.87–5.11)
RDW: 14.1 % (ref 11.5–15.5)
WBC: 17 10*3/uL — ABNORMAL HIGH (ref 4.0–10.5)
nRBC: 0 % (ref 0.0–0.2)

## 2021-05-30 LAB — COMPREHENSIVE METABOLIC PANEL
ALT: 18 U/L (ref 0–44)
AST: 20 U/L (ref 15–41)
Albumin: 4.7 g/dL (ref 3.5–5.0)
Alkaline Phosphatase: 61 U/L (ref 38–126)
Anion gap: 11 (ref 5–15)
BUN: 23 mg/dL (ref 8–23)
CO2: 29 mmol/L (ref 22–32)
Calcium: 9.7 mg/dL (ref 8.9–10.3)
Chloride: 98 mmol/L (ref 98–111)
Creatinine, Ser: 0.92 mg/dL (ref 0.44–1.00)
GFR, Estimated: >60 mL/min (ref >60)
Glucose, Bld: 141 mg/dL — ABNORMAL HIGH (ref 70–99)
Potassium: 3.6 mmol/L (ref 3.5–5.1)
Sodium: 138 mmol/L (ref 135–145)
Total Bilirubin: 1 mg/dL (ref 0.3–1.2)
Total Protein: 6.8 g/dL (ref 6.5–8.1)

## 2021-05-30 LAB — TROPONIN I (HIGH SENSITIVITY)
Troponin I (High Sensitivity): 6 ng/L (ref <18)
Troponin I (High Sensitivity): 6 ng/L (ref <18)

## 2021-05-30 LAB — RESP PANEL BY RT-PCR (FLU A&B, COVID) ARPGX2
Influenza A by PCR: NEGATIVE
Influenza B by PCR: NEGATIVE
SARS Coronavirus 2 by RT PCR: NEGATIVE

## 2021-05-30 LAB — CBG MONITORING, ED: Glucose-Capillary: 142 mg/dL — ABNORMAL HIGH (ref 70–99)

## 2021-05-30 NOTE — Patient Instructions (Signed)
Please go to [facility] ED right away for work-up - at very minimum need CT head and labs.

## 2021-05-30 NOTE — Progress Notes (Signed)
Acute Office Visit  Subjective:    Patient ID: Maria Lowe, female    DOB: 05/18/1931, 87 y.o.   MRN: [id number]  Chief Complaint  Patient presents with   Dizziness    1 week   Headache    Patient is in today for dizzy and shaky x 1 week. Hx intermittent A-fib, which she takes Eliquis for. Hx CLL, benign per patient.  States she felt very shaky two minutes ago. She is here with her daughter, who drove. BP 119/80 when she started to "feel real bad" on Tuesday (three days ago) & was just sitting there; felt like she needed to call EMS at that time, but didn't.   No falls. Lives alone. Says she has not felt safe to go out for the last week because of this dizzy feeling. States that her skin is hurting and she has a headache. Bilateral temporal headache that goes down into her neck. 6/10 pain. This headache comes and goes. Left ear has a "hissing" noise that also comes and goes but she says this isn't new.   Past Medical History:  Diagnosis Date   Arthritis    knee   CLL (chronic lymphocytic leukemia) ([place]) 11/27/2013   Hypertension    Ulcer    stomach ulcer when young    Past Surgical History:  Procedure Laterality Date   APPENDECTOMY     catarat Right 07/23/2019   catarat Left 08/31/2019   TONSILLECTOMY AND ADENOIDECTOMY      Family History  Problem Relation Age of Onset   Healthy Mother        died age 86 (communist [place] could not get records)   Other Father        died young-unclear cause    Social History   Socioeconomic History   Marital status: Married    Spouse name: Not on file   Number of children: Not on file   Years of education: Not on file   Highest education level: Not on file  Occupational History   Occupation: retired  Tobacco Use   Smoking status: Former    Packs/day: 0.20    Years: 20.00    Pack years: 4.00    Types: Cigarettes    Quit date: 09/21/1978    Years since quitting: 42.7   Smokeless tobacco: Never  Substance and Sexual  Activity   Alcohol use: Not Currently    Alcohol/week: 7.0 standard drinks    Types: 7 Standard drinks or equivalent per week   Drug use: No   Sexual activity: Not on file  Other Topics Concern   Not on file  Social History Narrative   Widowed April 2016. 3 children. 7 grandkids. 3 greatgrandkids.   Husband worked as Chief Executive Officer.       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      Hobbies: travel- husband was buried at home ([place])   Social Determinants of Radio broadcast assistant Strain: Low Risk    Difficulty of Paying Living Expenses: Not hard at all  Food Insecurity: No Food Insecurity   Worried About Charity fundraiser in the Last Year: Never true   Arboriculturist in the Last Year: Never true  Transportation Needs: No Transportation Needs   Lack of Transportation (Medical): No   Lack of Transportation (Non-Medical): No  Physical Activity: Insufficiently Active   Days of Exercise per Week: 2 days   Minutes of Exercise per  Session: 20 min  Stress: No Stress Concern Present   Feeling of Stress : Not at all  Social Connections: Socially Isolated   Frequency of Communication with Friends and Family: More than three times a week   Frequency of Social Gatherings with Friends and Family: Never   Attends Religious Services: Never   Marine scientist or Organizations: No   Attends Archivist Meetings: Never   Marital Status: Widowed  Human resources officer Violence: Not At Risk   Fear of Current or Ex-Partner: No   Emotionally Abused: No   Physically Abused: No   Sexually Abused: No    Outpatient Medications Prior to Visit  Medication Sig Dispense Refill   amLODipine (NORVASC) 10 MG tablet TAKE 1 TABLET BY MOUTH EVERY DAY 90 tablet 3   atenolol (TENORMIN) 100 MG tablet Take 1 tablet (100 mg total) by mouth daily. 90 tablet 3   ELIQUIS 5 MG TABS tablet TAKE 1 TABLET BY MOUTH TWICE A DAY 60 tablet 5   hydrochlorothiazide  (HYDRODIURIL) 25 MG tablet TAKE 1 TABLET BY MOUTH EVERY DAY 90 tablet 3   losartan (COZAAR) 100 MG tablet TAKE 1 TABLET BY MOUTH EVERY DAY 90 tablet 3   naproxen sodium (ALEVE) 220 MG tablet Take 220 mg by mouth daily as needed (pain).     No facility-administered medications prior to visit.    No Known Allergies  Review of Systems  Constitutional:  Negative for appetite change, fever and unexpected weight change.  HENT:  Negative for congestion and sinus pressure.   Eyes:  Negative for photophobia and visual disturbance.  Respiratory:  Negative for cough and shortness of breath.   Cardiovascular:  Negative for chest pain, palpitations and leg swelling.  Gastrointestinal:  Negative for abdominal pain, blood in stool, diarrhea, nausea and vomiting.  Genitourinary:  Negative for dysuria and frequency.  Musculoskeletal:  Positive for myalgias. Negative for back pain.  Skin:  Negative for rash.  Neurological:  Positive for dizziness, weakness and headaches. Negative for syncope, speech difficulty and light-headedness.  Psychiatric/Behavioral:  Negative for sleep disturbance and suicidal ideas. The patient is not nervous/anxious.       Objective:    Physical Exam Vitals and nursing note reviewed.  Constitutional:      Appearance: Normal appearance. She is normal weight. She is not toxic-appearing.  HENT:     Head: Normocephalic and atraumatic.     Right Ear: Tympanic membrane, ear canal and external ear normal.     Left Ear: Tympanic membrane, ear canal and external ear normal.     Nose: Nose normal.     Mouth/Throat:     Mouth: Mucous membranes are moist.  Eyes:     Extraocular Movements: Extraocular movements intact.     Conjunctiva/sclera: Conjunctivae normal.     Pupils: Pupils are equal, round, and reactive to light.  Cardiovascular:     Rate and Rhythm: Normal rate. Rhythm irregular.     Pulses: Normal pulses.     Heart sounds: Normal heart sounds.  Pulmonary:      Effort: Pulmonary effort is normal.     Breath sounds: Normal breath sounds.  Abdominal:     General: Abdomen is flat. Bowel sounds are normal.     Palpations: Abdomen is soft.  Musculoskeletal:        General: Normal range of motion.     Cervical back: Normal range of motion and neck supple.  Skin:    General: Skin is  warm and dry.  Neurological:     General: No focal deficit present.     Mental Status: She is alert and oriented to person, place, and time.     Cranial Nerves: No cranial nerve deficit.     Sensory: No sensory deficit.     Motor: Weakness (left arm strength slightly weaker than right) present.     Coordination: Romberg sign positive.     Gait: Gait abnormal and tandem walk abnormal.  Psychiatric:        Mood and Affect: Mood normal.        Behavior: Behavior normal.        Thought Content: Thought content normal.        Cognition and Memory: Cognition normal.        Judgment: Judgment normal.     Comments: A&Ox3    BP 139/72   Pulse 84   Temp 98.3 F (36.8 C)   Ht '5\' 2"'  (1.575 m)   Wt 150 lb 8 oz (68.3 kg)   SpO2 97%   BMI 27.53 kg/m  Wt Readings from Last 3 Encounters:  05/30/21 150 lb 8 oz (68.3 kg)  04/24/21 154 lb 12.8 oz (70.2 kg)  01/23/21 156 lb 12.8 oz (71.1 kg)    Health Maintenance Due  Topic Date Due   Zoster Vaccines- Shingrix (1 of 2) Never done   INFLUENZA VACCINE  04/21/2021   COVID-19 Vaccine (5 - Booster for Pfizer series) 06/02/2021    There are no preventive care reminders to display for this patient.   Lab Results  Component Value Date   TSH 1.31 04/19/2017   Lab Results  Component Value Date   WBC 21.4 (H) 01/23/2021   HGB 14.7 01/23/2021   HCT 46.2 (H) 01/23/2021   MCV 88.2 01/23/2021   PLT 115 (L) 01/23/2021   Lab Results  Component Value Date   NA 138 01/09/2021   K 4.5 01/09/2021   CHLORIDE 105 11/28/2015   CO2 29 01/09/2021   GLUCOSE 131 (H) 01/09/2021   BUN 27 (H) 01/09/2021   CREATININE 1.00 01/09/2021    BILITOT 0.8 01/09/2021   ALKPHOS 61 01/09/2021   AST 30 01/09/2021   ALT 28 01/09/2021   PROT 6.4 01/09/2021   ALBUMIN 4.3 01/09/2021   CALCIUM 9.7 01/09/2021   ANIONGAP 10 12/28/2017   EGFR 54 (L) 11/28/2015   GFR 50.64 (L) 01/09/2021   Lab Results  Component Value Date   CHOL 212 (H) 05/30/2020   Lab Results  Component Value Date   HDL 69 05/30/2020   Lab Results  Component Value Date   LDLCALC 113 (H) 05/30/2020   Lab Results  Component Value Date   TRIG 189 (H) 05/30/2020   Lab Results  Component Value Date   CHOLHDL 3.1 05/30/2020   Lab Results  Component Value Date   HGBA1C 5.9 11/29/2020       Assessment & Plan:   Problem List Items Addressed This Visit   None   1. Acute nonintractable headache, unspecified headache type 2. Dizziness 3. Shakiness 4. Blood thinned due to long-term anticoagulant use 87 year old pleasant patient here with her daughter today for new headache, dizziness, shakiness symptoms in the last week which are completely abnormal for her.  She is chronically on Eliquis for A. fib.  I am definitely concerned about possible acute bleed, and recommend urgent evaluation in the emergency department for CT of the head.  Also possible she may have some abnormalities  in her labs and recommend she get this done there as well.  Discussed doing this as an outpatient setting, but as it is Friday, we need to move these things along today and get an answer.  Patient and daughter were agreeable with the plan.  I discussed this with Dr. Jerline Pain as Dr. Yong Channel was out of office, and he was agreeable with a forementioned as well.   Stephanie Littman M Jayliani Wanner, PA-C

## 2021-05-30 NOTE — ED Notes (Signed)
Back from CT

## 2021-05-30 NOTE — ED Triage Notes (Signed)
Pt from Home c/o of feelings of weakness, dizziness, and being shaky since 1400 on september 4. Pt stated that she has continued to experience the same symptoms and saw her PCP today about same. PCP was worried abut TIA and sent Pt to ED. Pt is on Eliquis.   Pt does complain of an intermittent  headache that she now rates as a 6 out of 10.   Pt denis N/V

## 2021-05-30 NOTE — ED Provider Notes (Signed)
Maria Lowe   CSN: [id number] Arrival date & time: 05/30/21  1004     History Chief Complaint  Patient presents with   Weakness    Maria Lowe is a 87 y.o. female.  The history is provided by the patient.  Weakness Severity:  Mild Onset quality:  Gradual Duration:  1 week Timing:  Constant Progression:  Unchanged Chronicity:  New Context: not change in medication, not dehydration and not recent infection   Relieved by:  Nothing Worsened by:  Activity Ineffective treatments:  None tried Associated symptoms: dizziness ("not like things are moving, but like I am shaking"), headaches and myalgias   Associated symptoms: no abdominal pain, no anorexia, no arthralgias, no ataxia, no chest pain, no cough, no diarrhea, no difficulty walking, no dysuria, no numbness in extremities, no fever, no foul-smelling urine, no loss of consciousness, no seizures, no sensory-motor deficit, no shortness of breath, no urgency, no vision change and no vomiting       Past Medical History:  Diagnosis Date   Arthritis    knee   CLL (chronic lymphocytic leukemia) ([place]) 11/27/2013   Hypertension    Ulcer    stomach ulcer when young    Patient Active Problem List   Diagnosis Date Noted   Permanent atrial fibrillation ([place]) 04/24/2021   Secondary hypercoagulable state ([place]) 12/10/2020   Persistent atrial fibrillation ([place]) 11/29/2020   Hyperlipidemia, unspecified 04/07/2018   CKD (chronic kidney disease), stage III ([place]) 02/09/2018   Rash 02/09/2018   Seborrheic dermatitis 01/08/2017   Former smoker 09/30/2015   Uterine prolapse 03/29/2015   Thrombocytopenia ([place]) 11/26/2014   CLL (chronic lymphocytic leukemia) ([place]) 11/27/2013   Hyperglycemia 05/08/2013   Essential hypertension, benign 11/07/2012   Obesity, unspecified 11/07/2012    Past Surgical History:  Procedure Laterality Date   APPENDECTOMY     catarat Right 07/23/2019   catarat  Left 08/31/2019   TONSILLECTOMY AND ADENOIDECTOMY       OB History   No obstetric history on file.     Family History  Problem Relation Age of Onset   Healthy Mother        died age 42 (commun[place] could not get records)   Other Father        died young-unclear cause    Social History   Tobacco Use   Smoking status: Former    Packs/day: 0.20    Years: 20.00    Pack years: 4.00    Types: Cigarettes    Quit date: 09/21/1978    Years since quitting: 42.7   Smokeless tobacco: Never  Substance Use Topics   Alcohol use: Not Currently    Alcohol/week: 7.0 standard drinks    Types: 7 Standard drinks or equivalent per week   Drug use: No    Home Medications Prior to Admission medications   Medication Sig Start Date End Date Taking? Authorizing Provider  amLODipine (NORVASC) 10 MG tablet TAKE 1 TABLET BY MOUTH EVERY DAY 05/12/21   [name], MD  atenolol (TENORMIN) 100 MG tablet Take 1 tablet (100 mg total) by mouth daily. 05/30/20   [name], MD  ELIQUIS 5 MG TABS tablet TAKE 1 TABLET BY MOUTH TWICE A DAY 05/21/21   [name], MD  hydrochlorothiazide (HYDRODIURIL) 25 MG tablet TAKE 1 TABLET BY MOUTH EVERY DAY 05/20/21   [name], MD  losartan (COZAAR) 100 MG tablet TAKE 1 TABLET BY MOUTH EVERY DAY 05/27/21  [name], MD  naproxen sodium (ALEVE) 220 MG tablet Take 220 mg by mouth daily as needed (pain).    [provider]    Allergies    Patient has no known allergies.  Review of Systems   Review of Systems  Constitutional:  Negative for chills and fever.  HENT:  Negative for ear pain and sore throat.   Eyes:  Negative for pain and visual disturbance.  Respiratory:  Negative for cough and shortness of breath.   Cardiovascular:  Negative for chest pain and palpitations.  Gastrointestinal:  Negative for abdominal pain, anorexia, diarrhea and vomiting.  Genitourinary:  Negative for dysuria, hematuria and urgency.   Musculoskeletal:  Positive for myalgias. Negative for arthralgias and back pain.  Skin:  Negative for color change and rash.       Feels like skin has been tight or painful  Neurological:  Positive for dizziness ("not like things are moving, but like I am shaking"), weakness and headaches. Negative for seizures, loss of consciousness and syncope.  All other systems reviewed and are negative.  Physical Exam Updated Vital Signs BP (!) 145/76   Pulse 85   Temp 98.7 F (37.1 C) (Oral)   Resp 20   Ht 5' (1.524 m)   Wt 68 kg   SpO2 95%   BMI 29.29 kg/m   Physical Exam Vitals and nursing Lowe reviewed.  Constitutional:      Appearance: She is well-developed.  HENT:     Head: Normocephalic and atraumatic.  Cardiovascular:     Rate and Rhythm: Normal rate and regular rhythm.     Heart sounds: Normal heart sounds.  Pulmonary:     Effort: Pulmonary effort is normal. No tachypnea.     Breath sounds: Normal breath sounds.  Abdominal:     Palpations: Abdomen is soft.     Tenderness: There is no abdominal tenderness.  Musculoskeletal:     Right lower leg: No edema.     Left lower leg: No edema.  Skin:    General: Skin is warm and dry.  Neurological:     General: No focal deficit present.     Mental Status: She is alert and oriented to person, place, and time.     Cranial Nerves: No cranial nerve deficit.     Sensory: No sensory deficit.     Motor: No weakness.     Coordination: Coordination normal.  Psychiatric:        Mood and Affect: Mood normal.        Behavior: Behavior normal.    ED Results / Procedures / Treatments   Labs (all labs ordered are listed, but only abnormal results are displayed) Labs Reviewed  CBC WITH DIFFERENTIAL/PLATELET - Abnormal; Notable for the following components:      Result Value   WBC 17.0 (*)    Platelets 124 (*)    Lymphs Abs 11.0 (*)    All other components within normal limits  COMPREHENSIVE METABOLIC PANEL - Abnormal; Notable for the  following components:   Glucose, Bld 141 (*)    All other components within normal limits  URINALYSIS, ROUTINE W REFLEX MICROSCOPIC - Abnormal; Notable for the following components:   Color, Urine COLORLESS (*)    Bacteria, UA RARE (*)    All other components within normal limits  CBG MONITORING, ED - Abnormal; Notable for the following components:   Glucose-Capillary 142 (*)    All other components within normal limits  RESP PANEL  BY RT-PCR (FLU A&B, COVID) [id number]  CBG MONITORING, ED  TROPONIN I (HIGH SENSITIVITY)  TROPONIN I (HIGH SENSITIVITY)    EKG EKG Interpretation  Date/Time:  Friday May 30 2021 10:22:10 EDT Ventricular Rate:  78 PR Interval:    QRS Duration: 127 QT Interval:  382 QTC Calculation: 436 R Axis:   -53 Text Interpretation: Atrial fibrillation Left bundle branch block Left bundle branch block is new when compared to EKG from 04/24/21 Confirmed by Lorre Munroe ([id number]) on 05/30/2021 11:04:26 AM  Radiology DG Chest 2 View  Result Date: 05/30/2021 CLINICAL DATA:  Evaluate possible pneumonia. Weakness and dizziness. EXAM: CHEST - 2 VIEW COMPARISON:  None. FINDINGS: Both lungs are clear. There may be a 5 mm nodule in the left mid lung which is likely an incidental finding. Heart size is within normal limits. Negative for a pneumothorax. No large pleural effusions. Mild degenerative changes in the lower thoracic spine. IMPRESSION: No active cardiopulmonary disease. Electronically Signed   By: Markus Daft M.D.   On: 05/30/2021 11:19   CT Head Wo Contrast  Result Date: 05/30/2021 CLINICAL DATA:  Altered mental status, weakness, dizziness EXAM: CT HEAD WITHOUT CONTRAST TECHNIQUE: Contiguous axial images were obtained from the base of the skull through the vertex without intravenous contrast. COMPARISON:  None. FINDINGS: Brain: No evidence of acute infarction, hemorrhage, hydrocephalus, extra-axial collection or mass lesion/mass effect. Periventricular white matter changes,  likely the sequela of chronic small vessel ischemic disease. Lacunar infarct in the left lentiform nucleus. Vascular: No hyperdense vessel. Atherosclerotic calcifications in the intracranial carotid and vertebral arteries. Skull: Normal. Negative for fracture or focal lesion. Sinuses/Orbits: Postsurgical changes in the bilateral globes. The orbits and sinuses are otherwise unremarkable. Other: The mastoids are well aerated. IMPRESSION: No acute intracranial process. Electronically Signed   By: Merilyn Baba M.D.   On: 05/30/2021 11:20    Procedures Procedures   Medications Ordered in ED Medications - No data to display  ED Course  I have reviewed the triage vital signs and the nursing notes.  Pertinent labs & imaging results that were available during my care of the patient were reviewed by me and considered in my medical decision making (see chart for details).    MDM Rules/Calculators/A&P                           Malory S Que presented with 1 week of very nonspecific symptoms including muscle aches, weakness, dizziness.  Dizziness was described as being shaky rather than any sort of vertigo or ataxia.  Neuro exam was normal.  She was evaluated for evidence of cause of her symptoms from infectious, to metabolic.  No evidence of ACS.  Her white count was slightly elevated in keeping with her CLL, but otherwise, her work-up was unremarkable.  Unclear her exact etiology of symptoms, but at this point, she appears to be suitable for outpatient work-up and further management. Final Clinical Impression(s) / ED Diagnoses Final diagnoses:  Weakness    Rx / DC Orders ED Discharge Orders     None        Arnaldo Natal, MD 05/30/21 1437

## 2021-05-30 NOTE — ED Notes (Signed)
Patient transported to CT 

## 2021-06-09 NOTE — Progress Notes (Signed)
Phone [phone]   Subjective:  Patient presents today for their annual physical. Chief complaint-noted.   See problem oriented charting- ROS- full  review of systems was completed and negative except for: fatigue, irregular HR with a fib, constipation, dizziness, headaches, weakness, bruising  The following were reviewed and entered/updated in epic: Past Medical History:  Diagnosis Date   Arthritis    knee   CLL (chronic lymphocytic leukemia) ([place]) 11/27/2013   Hypertension    Ulcer    stomach ulcer when young   Patient Active Problem List   Diagnosis Date Noted   Thrombocytopenia ([place]) 11/26/2014    Priority: High   CLL (chronic lymphocytic leukemia) ([place]) 11/27/2013    Priority: High   Hyperlipidemia, unspecified 04/07/2018    Priority: Medium   CKD (chronic kidney disease), stage III ([place]) 02/09/2018    Priority: Medium   Hyperglycemia 05/08/2013    Priority: Medium   Essential hypertension, benign 11/07/2012    Priority: Medium   Rash 02/09/2018    Priority: Low   Seborrheic dermatitis 01/08/2017    Priority: Low   Former smoker 09/30/2015    Priority: Low   Uterine prolapse 03/29/2015    Priority: Low   Obesity, unspecified 11/07/2012    Priority: Low   Permanent atrial fibrillation ([place]) 04/24/2021   Secondary hypercoagulable state ([place]) 12/10/2020   Persistent atrial fibrillation ([place]) 11/29/2020   Past Surgical History:  Procedure Laterality Date   APPENDECTOMY     catarat Right 07/23/2019   catarat Left 08/31/2019   TONSILLECTOMY AND ADENOIDECTOMY      Family History  Problem Relation Age of Onset   Healthy Mother        died age 78 (communist country could not get records)   Other Father        died young-unclear cause    Medications- reviewed and updated Current Outpatient Medications  Medication Sig Dispense Refill   amLODipine (NORVASC) 10 MG tablet TAKE 1 TABLET BY MOUTH EVERY DAY 90 tablet 3   atenolol (TENORMIN) 100 MG tablet  Take 1 tablet (100 mg total) by mouth daily. 90 tablet 3   ELIQUIS 5 MG TABS tablet TAKE 1 TABLET BY MOUTH TWICE A DAY 60 tablet 5   hydrochlorothiazide (HYDRODIURIL) 25 MG tablet TAKE 1 TABLET BY MOUTH EVERY DAY 90 tablet 3   losartan (COZAAR) 100 MG tablet TAKE 1 TABLET BY MOUTH EVERY DAY 90 tablet 3   naproxen sodium (ALEVE) 220 MG tablet Take 220 mg by mouth daily as needed (pain).     No current facility-administered medications for this visit.    Allergies-reviewed and updated No Known Allergies  Social History   Social History Narrative   Widowed April 2016. 3 children. 7 grandkids. 3 greatgrandkids.   Husband worked as Chief Executive Officer.       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 Oropharynx normal Neck: no thyromegaly CV: irregularly irregular,  no murmurs rubs or gallops Lungs: CTAB no crackles, wheeze, rhonchi Abdomen: soft/nontender/nondistended/normal bowel sounds. No rebound or guarding.  Ext: no edema Skin: warm, dry Neuro: grossly  normal, moves all extremities, PERRLA   Assessment and Plan   87 y.o. female presenting for annual physical.  Health Maintenance counseling: 1. Anticipatory guidance: Patient counseled regarding regular dental exams - dentures, eye exams - plans to see within next year,,  avoiding smoking and second hand smoke , limiting alcohol to 1 beverage per day- has stopped this. No illicit drugs  2. Risk factor reduction:  Advised patient of need for regular exercise and diet rich and fruits and vegetables to reduce risk of heart attack and stroke. Exercise-- little walking with how she is feeling- we are going to reduce BP  meds Diet-reasonably healthy- reasonably healthy food choices Wt Readings from Last 3 Encounters:  06/13/21 151 lb (68.5 kg)  05/30/21 150 lb (68 kg)  05/30/21 150 lb 8 oz (68.3 kg)  3. Immunizations/screenings/ancillary studies DISCUSSED:  -Shingrix vaccination #1 - pharmacy recommended -Flu vaccination (last one 10/21) - she will let us know when gets at pharmacy in october -COVID-19 vaccination #5 repeat planned - omicron booste rrecommended Immunization History  Administered Date(s) Administered   Influenza Whole 06/30/2013   Influenza, High Dose Seasonal PF 06/25/2016, 06/21/2019   Influenza,inj,Quad PF,6+ Mos 06/13/2014   Influenza-Unspecified 07/12/2015, 06/09/2017, 06/21/2020   PFIZER Comirnaty(Gray Top)Covid-19 Tri-Sucrose Vaccine 01/30/2021   PFIZER(Purple Top)SARS-COV-2 Vaccination 12/13/2019, 12/30/2019, 07/12/2020   Pneumococcal Conjugate-13 06/25/2016   Pneumococcal Polysaccharide-23 06/21/2013   Td 04/07/2018  4. Cervical cancer screening-past age based screening- never had a normal exam/pap. No bleeding or discharge - does have prolapse- referring to GYN 5. Breast cancer screening-  breast exam - -prefers to do self exams and has no lumps and mammogram-  past age based screening recommendations per USPTF 6. Colon cancer screening -   past age based screening recommendations.  Reports never had polyps.  No blood in stool.  No melena. 7. Skin cancer screening- no dermatologist. advised regular sunscreen use. Denies worrisome, changing, or new skin lesions.  8. Birth control/STD check-widowed.  Not sexually active  9. Osteoporosis screening at 59- DEXA postponed  on HM- declines bone density- prefers to avoid anything that may lead to meds if possible- have discussed reasoning for DEXA but she declines -former smoker-  quit in 1980s- no screenings related to this  Status of chronic or acute concerns   #Social update-patient recently lost her sister- only remaining child.  previously wanted  to get back to [place] but with war , was not able to go-still unable to go  #Uterine prolapse- history of this 6 years ago and was stable- slightly worse lately- wants to have updated GYN exam . Wants female provider   # ED visit for mild weakness S:She went to the ED on 05/30/2021 presented with weakness. She reported it was going on for a week, its constant and gradual. Also its unchanged and relieved by nothing. Worsened with activity. Associated symptoms are dizziness, headaches and myalgias. Denied any abdominal pain, anorexia, diarrhea, difficulty walking, dysuria, chest pain, cough, numbness in extremities, fever or seizures.   -No evidence of ACS.  Her white count was slightly elevated in keeping with her CLL, but otherwise, her work-up was unremarkable.  Unclear her exact etiology of symptoms, but at that point, she appeared to be suitable for outpatient work-up.  She had been seen by our office that day-new headache, dizziness, shakiness reported and there was concern for bleed so she was sent to the emergency room-thankfully CT of the head was reassuring as well as chest x-ray  Today  patient reports shakiness is worse when BP runs low- see HTN.   A/P: unclear cause of weakness, shakiness, headache- we will try to reduce pill burden/bp meds given some lower home BPs- let BP ride up to 150 potentially . A fib was rate controlled -if not feeling better as we reduce blood pressure meds- we can investigate other causes -headaches worse when BP low- once again loosen up goals  #atrial fibrillation S: Patient was asymptomatic on 03/22 but on exam had a regular heart rate -start eliquis 5 mg twice daily due to CHA2DS2-VASc score of 4 (had CKD but creatinine was less than 1.5 and greater than 60 kg). placed a referral to cardiology due to poor R wave progression  Patient most recently saw cardiology on 04/24/2021-diagnosed with permanent atrial fibrillation and plan was for rate  control and was started on atenolol 100 mg daily A/P: appropriately rate controlled and anticoagulated- does not sound like this was cauing symptoms that took her to ER. Continue rx  #hypertension- transition her goal up to 355 systolic S: medication: Amlodipine 10Mg  daily, Atenolol 100Mg  daily, HCTZ 25Mg  daily, losartan 100 mg daily Home readings #s: Home blood pressure readings typically between 732 and 202 systolic with diastolic well-controlled-she did have some readings with systolics under 542 and felt lightheaded/shaky BP Readings from Last 3 Encounters:  06/13/21 129/78  05/30/21 (!) 142/62  05/30/21 139/72  A/P: Blood pressure is controlled in the office but has been overcontrolled at times at home.  He will continue current medications with the exception of cutting her hydrochlorothiazide 25 mg in half so she will take 12.5 mg.  She will update me in 2 weeks by phone-if any further lows I would consider stopping this completely  #Chronic kidney disease stage III S: GFR is typically in the 50s range -Patient knows to avoid NSAIDs- rare aleve advised against- she states tylenol doesn't work as well  A/P: surprisingly in ER kidney function was better- will monitor   #CLL-followed closely with Dr. Alvy Bimler and been stable. Also had thrombocytopenia related to CLL- wbc elevated within 2 weeks   #hyperlipidemia S: Medication:None. Patient remained off medication given over age 60 without history of heart attack stroke Lab Results  Component Value Date   CHOL 212 (H) 05/30/2020   HDL 69 05/30/2020   LDLCALC 113 (H) 05/30/2020   TRIG 189 (H) 05/30/2020   CHOLHDL 3.1 05/30/2020   A/P: Cholesterol poorly controlled mildly last year-discussed repeating today but since she had blood work 2 weeks ago she would like to hold off for now-we will plan on doing this in 6 months  # Hyperglycemia/insulin resistance/prediabetes- a1c as high as 6 S: Medication: None Exercise and diet- see  above Lab Results  Component Value Date   HGBA1C 5.9 11/29/2020   HGBA1C 5.5 05/30/2020   HGBA1C 5.8 11/23/2019    A/P: Patient would like to hold off on blood work since had 2 weeks ago and will have some more with hematology soon-we will plan on an A1c next visit-strongly doubt she has progressed to diabetes  Recommended follow up: Return in about 6 months (around 12/11/2021) for follow up- or sooner if needed. Future Appointments  Date Time Provider [place]  06/17/2021  9:45 AM [facility] None  06/17/2021 10:20 AM Heath Lark, MD [facility] None  06/20/2021  9:30 AM [facility] HEALTH COACH [facility]  11/24/2021  1:30 PM [facility] PHARMACIST [facility]   Lab/Order associations:NOT fasting but no labs  ICD-10-CM   1. Preventative health care  Z00.00     2. Hyperglycemia  R73.9     3. Hyperlipidemia, unspecified hyperlipidemia type  E78.5     4. Stage 3 chronic kidney disease, unspecified whether stage 3a or 3b CKD (HCC)  N18.30     5. CLL (chronic lymphocytic leukemia) (HCC)  C91.10     6. Essential hypertension, benign  I10     7. New onset atrial fibrillation (HCC)  I48.91     8. Uterine prolapse  N81.4 Ambulatory referral to Gynecology      No orders of the defined types were placed in this encounter.   I,Jada Bradford,acting as a scribe for [name], MD.,have documented all relevant documentation on the behalf of [name], MD,as directed by  [name], MD while in the presence of [name], MD.  I, [name], MD, have reviewed all documentation for this visit. The documentation on 06/13/21 for the exam, diagnosis, procedures, and orders are all accurate and complete.  Return precautions advised.  [name], MD

## 2021-06-13 ENCOUNTER — Ambulatory Visit (INDEPENDENT_AMBULATORY_CARE_PROVIDER_SITE_OTHER): Payer: PPO | Admitting: Family Medicine

## 2021-06-13 ENCOUNTER — Other Ambulatory Visit: Payer: Self-pay

## 2021-06-13 ENCOUNTER — Encounter: Payer: Self-pay | Admitting: Family Medicine

## 2021-06-13 VITALS — BP 129/78 | HR 78 | Temp 97.6°F | Ht 62.0 in | Wt 151.0 lb

## 2021-06-13 DIAGNOSIS — I4891 Unspecified atrial fibrillation: Secondary | ICD-10-CM

## 2021-06-13 DIAGNOSIS — C9110 Chronic lymphocytic leukemia of B-cell type not having achieved remission: Secondary | ICD-10-CM | POA: Diagnosis not present

## 2021-06-13 DIAGNOSIS — N814 Uterovaginal prolapse, unspecified: Secondary | ICD-10-CM | POA: Diagnosis not present

## 2021-06-13 DIAGNOSIS — Z0000 Encounter for general adult medical examination without abnormal findings: Secondary | ICD-10-CM

## 2021-06-13 DIAGNOSIS — E785 Hyperlipidemia, unspecified: Secondary | ICD-10-CM

## 2021-06-13 DIAGNOSIS — N183 Chronic kidney disease, stage 3 unspecified: Secondary | ICD-10-CM | POA: Diagnosis not present

## 2021-06-13 DIAGNOSIS — R739 Hyperglycemia, unspecified: Secondary | ICD-10-CM

## 2021-06-13 DIAGNOSIS — I10 Essential (primary) hypertension: Secondary | ICD-10-CM

## 2021-06-13 NOTE — Patient Instructions (Addendum)
Health Maintenance Due  Topic Date Due   Zoster Vaccines- Shingrix (1 of 2) Please check with your pharmacy to see if they have the shingrix vaccine. If they do- please get this immunization and update [place] by phone call or mychart with dates you receive the vaccine.  Never done   INFLUENZA VACCINE Patient states that she will get this done at her local pharmacy in October and update [place] with the information.  04/21/2021   COVID-19 Vaccine (5 - Booster for [employer] series) discuss.  06/02/2021   No labs today per your request- we will do next visit  Blood pressure is controlled in the office but has been overcontrolled at times at home.  He will continue current medications with the exception of cutting her hydrochlorothiazide 25 mg in half so she will take 12.5 mg.  She will update me in 2 weeks by phone-if any further lows I would consider stopping this completely  We will call you within two weeks about your referral to Gynecology if you do not hear within. If you do not hear within 2 weeks, give [place] a call.  [email or web address] - can call them also if you do not hear  Recommended follow up: Return in about 6 months (around 12/11/2021) for follow up- or sooner if needed. - if you continue to feel poorly please see me sooner if reducing meds doesn't work

## 2021-06-17 ENCOUNTER — Other Ambulatory Visit: Payer: Self-pay

## 2021-06-17 ENCOUNTER — Inpatient Hospital Stay: Payer: PPO | Attending: Hematology and Oncology

## 2021-06-17 ENCOUNTER — Ambulatory Visit: Payer: PPO | Admitting: Hematology and Oncology

## 2021-06-17 ENCOUNTER — Inpatient Hospital Stay: Payer: PPO | Admitting: Hematology and Oncology

## 2021-06-17 ENCOUNTER — Encounter: Payer: Self-pay | Admitting: Hematology and Oncology

## 2021-06-17 DIAGNOSIS — C9110 Chronic lymphocytic leukemia of B-cell type not having achieved remission: Secondary | ICD-10-CM | POA: Diagnosis not present

## 2021-06-17 DIAGNOSIS — I10 Essential (primary) hypertension: Secondary | ICD-10-CM | POA: Insufficient documentation

## 2021-06-17 DIAGNOSIS — D696 Thrombocytopenia, unspecified: Secondary | ICD-10-CM | POA: Diagnosis not present

## 2021-06-17 DIAGNOSIS — D6959 Other secondary thrombocytopenia: Secondary | ICD-10-CM | POA: Diagnosis not present

## 2021-06-17 LAB — CBC WITH DIFFERENTIAL/PLATELET
Abs Immature Granulocytes: 0.05 10*3/uL (ref 0.00–0.07)
Basophils Absolute: 0.1 10*3/uL (ref 0.0–0.1)
Basophils Relative: 0 %
Eosinophils Absolute: 0.1 10*3/uL (ref 0.0–0.5)
Eosinophils Relative: 0 %
HCT: 43.8 % (ref 36.0–46.0)
Hemoglobin: 14.2 g/dL (ref 12.0–15.0)
Immature Granulocytes: 0 %
Lymphocytes Relative: 69 %
Lymphs Abs: 14.5 10*3/uL — ABNORMAL HIGH (ref 0.7–4.0)
MCH: 28.7 pg (ref 26.0–34.0)
MCHC: 32.4 g/dL (ref 30.0–36.0)
MCV: 88.5 fL (ref 80.0–100.0)
Monocytes Absolute: 0.7 10*3/uL (ref 0.1–1.0)
Monocytes Relative: 3 %
Neutro Abs: 6.1 10*3/uL (ref 1.7–7.7)
Neutrophils Relative %: 28 %
Platelets: 142 10*3/uL — ABNORMAL LOW (ref 150–400)
RBC: 4.95 MIL/uL (ref 3.87–5.11)
RDW: 14.4 % (ref 11.5–15.5)
WBC: 21.5 10*3/uL — ABNORMAL HIGH (ref 4.0–10.5)
nRBC: 0.1 % (ref 0.0–0.2)

## 2021-06-17 NOTE — Progress Notes (Signed)
[facility] OFFICE PROGRESS NOTE  Patient Care Team: [name], MD as PCP - General (Family Medicine) Heath Lark, MD as Consulting Physician (Hematology and Oncology) Edythe Clarity, [facility] (Pharmacist)  ASSESSMENT & PLAN:  CLL (chronic lymphocytic leukemia) ([place]) Clinically, she have no signs or symptoms of progression Lymphocytosis is stable Her thrombocytopenia is stable We discussed the risk and benefits of future follow-up and I will see her again next year  Thrombocytopenia [facility]) This is related to CLL but she is not symptomatic.  Continue observation only. The patient is educated to watch out for signs and symptoms of bleeding There is no contraindication for her to remain on Eliquis as long as her platelet count is over 50,000  Essential hypertension, benign She has whitecoat hypertension.  Her blood pressure measured in her primary care doctor's office recently was satisfactory.  She will continue same blood pressure medication without dose adjustment  No orders of the defined types were placed in this encounter.   All questions were answered. The patient knows to call the clinic with any problems, questions or concerns. The total time spent in the appointment was 20 minutes encounter with patients including review of chart and various tests results, discussions about plan of care and coordination of care plan   Heath Lark, MD 06/17/2021 10:17 AM  INTERVAL HISTORY: Please see below for problem oriented charting. she returns for follow-up on CLL on surveillance Her main complaint is occasional dizziness especially with postural changes Denies recent bleeding complication from her anticoagulation therapy No recent infection No new lymphadenopathy  REVIEW OF SYSTEMS:   Constitutional: Denies fevers, chills or abnormal weight loss Eyes: Denies blurriness of vision Ears, nose, mouth, throat, and face: Denies mucositis or sore throat Respiratory:  Denies cough, dyspnea or wheezes Cardiovascular: Denies palpitation, chest discomfort or lower extremity swelling Gastrointestinal:  Denies nausea, heartburn or change in bowel habits Skin: Denies abnormal skin rashes Lymphatics: Denies new lymphadenopathy or easy bruising Neurological:Denies numbness, tingling or new weaknesses Behavioral/Psych: Mood is stable, no new changes  All other systems were reviewed with the patient and are negative.  I have reviewed the past medical history, past surgical history, social history and family history with the patient and they are unchanged from previous note.  ALLERGIES:  has No Known Allergies.  MEDICATIONS:  Current Outpatient Medications  Medication Sig Dispense Refill   amLODipine (NORVASC) 10 MG tablet TAKE 1 TABLET BY MOUTH EVERY DAY 90 tablet 3   atenolol (TENORMIN) 100 MG tablet Take 1 tablet (100 mg total) by mouth daily. 90 tablet 3   ELIQUIS 5 MG TABS tablet TAKE 1 TABLET BY MOUTH TWICE A DAY 60 tablet 5   hydrochlorothiazide (HYDRODIURIL) 25 MG tablet TAKE 1 TABLET BY MOUTH EVERY DAY 90 tablet 3   losartan (COZAAR) 100 MG tablet TAKE 1 TABLET BY MOUTH EVERY DAY 90 tablet 3   naproxen sodium (ALEVE) 220 MG tablet Take 220 mg by mouth daily as needed (pain).     No current facility-administered medications for this visit.    SUMMARY OF ONCOLOGIC HISTORY: Oncology History  CLL (chronic lymphocytic leukemia) ([place])  11/27/2013 Initial Diagnosis   CLL (chronic lymphocytic leukemia) ([place])   06/17/2021 Cancer Staging   Staging form: Chronic Lymphocytic Leukemia / Small Lymphocytic Lymphoma, AJCC 8th Edition - Clinical stage from 06/17/2021: Modified Rai Stage 0 (Modified Rai risk: Low, Lymphocytosis: Present, Adenopathy: Absent, Organomegaly: Absent, Anemia: Absent, Thrombocytopenia: Absent) - Signed by Heath Lark, MD on  06/17/2021 Stage prefix: Initial diagnosis     PHYSICAL EXAMINATION: ECOG PERFORMANCE STATUS: 1 - Symptomatic but  completely ambulatory  Vitals:   06/17/21 1011  BP: (!) 163/92  Pulse: 83  Resp: 18  Temp: 97.8 F (36.6 C)  SpO2: 97%   Filed Weights   06/17/21 1011  Weight: 152 lb 6.4 oz (69.1 kg)    GENERAL:alert, no distress and comfortable SKIN: skin color, texture, turgor are normal, no rashes or significant lesions EYES: normal, Conjunctiva are pink and non-injected, sclera clear OROPHARYNX:no exudate, no erythema and lips, buccal mucosa, and tongue normal  NECK: supple, thyroid normal size, non-tender, without nodularity LYMPH:  no palpable lymphadenopathy in the cervical, axillary or inguinal LUNGS: clear to auscultation and percussion with normal breathing effort HEART: regular rate & rhythm and no murmurs and no lower extremity edema ABDOMEN:abdomen soft, non-tender and normal bowel sounds Musculoskeletal:no cyanosis of digits and no clubbing  NEURO: alert & oriented x 3 with fluent speech, no focal motor/sensory deficits  LABORATORY DATA:  I have reviewed the data as listed    Component Value Date/Time   NA 138 05/30/2021 1036   NA 141 11/28/2015 0959   K 3.6 05/30/2021 1036   K 4.3 11/28/2015 0959   CL 98 05/30/2021 1036   CO2 29 05/30/2021 1036   CO2 27 11/28/2015 0959   GLUCOSE 141 (H) 05/30/2021 1036   GLUCOSE 113 11/28/2015 0959   BUN 23 05/30/2021 1036   BUN 23.6 11/28/2015 0959   CREATININE 0.92 05/30/2021 1036   CREATININE 0.89 (H) 05/30/2020 1419   CREATININE 1.0 11/28/2015 0959   CALCIUM 9.7 05/30/2021 1036   CALCIUM 9.5 11/28/2015 0959   PROT 6.8 05/30/2021 1036   PROT 6.6 11/28/2015 0959   ALBUMIN 4.7 05/30/2021 1036   ALBUMIN 4.2 11/28/2015 0959   AST 20 05/30/2021 1036   AST 17 11/28/2015 0959   ALT 18 05/30/2021 1036   ALT 15 11/28/2015 0959   ALKPHOS 61 05/30/2021 1036   ALKPHOS 72 11/28/2015 0959   BILITOT 1.0 05/30/2021 1036   BILITOT 0.64 11/28/2015 0959   GFRNONAA >60 05/30/2021 1036   GFRNONAA 59 (L) 05/30/2020 1419   GFRAA 68 05/30/2020  1419    No results found for: SPEP, UPEP  Lab Results  Component Value Date   WBC 21.5 (H) 06/17/2021   NEUTROABS 6.1 06/17/2021   HGB 14.2 06/17/2021   HCT 43.8 06/17/2021   MCV 88.5 06/17/2021   PLT 142 (L) 06/17/2021      Chemistry      Component Value Date/Time   NA 138 05/30/2021 1036   NA 141 11/28/2015 0959   K 3.6 05/30/2021 1036   K 4.3 11/28/2015 0959   CL 98 05/30/2021 1036   CO2 29 05/30/2021 1036   CO2 27 11/28/2015 0959   BUN 23 05/30/2021 1036   BUN 23.6 11/28/2015 0959   CREATININE 0.92 05/30/2021 1036   CREATININE 0.89 (H) 05/30/2020 1419   CREATININE 1.0 11/28/2015 0959      Component Value Date/Time   CALCIUM 9.7 05/30/2021 1036   CALCIUM 9.5 11/28/2015 0959   ALKPHOS 61 05/30/2021 1036   ALKPHOS 72 11/28/2015 0959   AST 20 05/30/2021 1036   AST 17 11/28/2015 0959   ALT 18 05/30/2021 1036   ALT 15 11/28/2015 0959   BILITOT 1.0 05/30/2021 1036   BILITOT 0.64 11/28/2015 0959       RADIOGRAPHIC STUDIES: I have personally reviewed the radiological images as listed  and agreed with the findings in the report. DG Chest 2 View  Result Date: 05/30/2021 CLINICAL DATA:  Evaluate possible pneumonia. Weakness and dizziness. EXAM: CHEST - 2 VIEW COMPARISON:  None. FINDINGS: Both lungs are clear. There may be a 5 mm nodule in the left mid lung which is likely an incidental finding. Heart size is within normal limits. Negative for a pneumothorax. No large pleural effusions. Mild degenerative changes in the lower thoracic spine. IMPRESSION: No active cardiopulmonary disease. Electronically Signed   By: Markus Daft M.D.   On: 05/30/2021 11:19   CT Head Wo Contrast  Result Date: 05/30/2021 CLINICAL DATA:  Altered mental status, weakness, dizziness EXAM: CT HEAD WITHOUT CONTRAST TECHNIQUE: Contiguous axial images were obtained from the base of the skull through the vertex without intravenous contrast. COMPARISON:  None. FINDINGS: Brain: No evidence of acute  infarction, hemorrhage, hydrocephalus, extra-axial collection or mass lesion/mass effect. Periventricular white matter changes, likely the sequela of chronic small vessel ischemic disease. Lacunar infarct in the left lentiform nucleus. Vascular: No hyperdense vessel. Atherosclerotic calcifications in the intracranial carotid and vertebral arteries. Skull: Normal. Negative for fracture or focal lesion. Sinuses/Orbits: Postsurgical changes in the bilateral globes. The orbits and sinuses are otherwise unremarkable. Other: The mastoids are well aerated. IMPRESSION: No acute intracranial process. Electronically Signed   By: Merilyn Baba M.D.   On: 05/30/2021 11:20

## 2021-06-17 NOTE — Assessment & Plan Note (Addendum)
This is related to CLL but she is not symptomatic.  Continue observation only. The patient is educated to watch out for signs and symptoms of bleeding There is no contraindication for her to remain on Eliquis as long as her platelet count is over 50,000

## 2021-06-17 NOTE — Assessment & Plan Note (Signed)
Clinically, she have no signs or symptoms of progression Lymphocytosis is stable Her thrombocytopenia is stable We discussed the risk and benefits of future follow-up and I will see her again next year

## 2021-06-17 NOTE — Assessment & Plan Note (Signed)
She has whitecoat hypertension.  Her blood pressure measured in her primary care doctor's office recently was satisfactory.  She will continue same blood pressure medication without dose adjustment

## 2021-06-20 ENCOUNTER — Other Ambulatory Visit: Payer: Self-pay

## 2021-06-20 ENCOUNTER — Ambulatory Visit (INDEPENDENT_AMBULATORY_CARE_PROVIDER_SITE_OTHER): Payer: PPO

## 2021-06-20 DIAGNOSIS — Z0000 Encounter for general adult medical examination without abnormal findings: Secondary | ICD-10-CM | POA: Diagnosis not present

## 2021-06-20 NOTE — Progress Notes (Signed)
Virtual Visit via Telephone Note  I connected with  Maria Lowe on 06/20/21 at  9:30 AM EDT by telephone and verified that I am speaking with the correct person using two identifiers.  Medicare Annual Wellness visit completed telephonically due to Covid-19 pandemic.   Persons participating in this call: This Health Coach and this patient.   Location: Patient: Home Provider: Office   I discussed the limitations, risks, security and privacy concerns of performing an evaluation and management service by telephone and the availability of in person appointments. The patient expressed understanding and agreed to proceed.  Unable to perform video visit due to video visit attempted and failed and/or patient does not have video capability.   Some vital signs may be absent or patient reported.   Willette Brace, LPN   Subjective:   Maria Lowe is a 87 y.o. female who presents for Medicare Annual (Subsequent) preventive examination.  Review of Systems     Cardiac Risk Factors include: advanced age (>37men, >72 women);hypertension;dyslipidemia     Objective:    There were no vitals filed for this visit. There is no height or weight on file to calculate BMI.  Advanced Directives 06/20/2021 05/30/2021 06/17/2020 05/18/2019 04/07/2018 11/28/2015 11/26/2014  Does Patient Have a Medical Advance Directive? No No No Yes No No No  Type of Advance Directive - - - Living will;Healthcare Power of Attorney - - -  Does patient want to make changes to medical advance directive? - - - No - Patient declined - - -  Copy of [place] in Chart? - - - No - copy requested - - -  Would patient like information on creating a medical advance directive? No - Patient declined - No - Patient declined - No - Patient declined No - patient declined information No - patient declined information    Current Medications (verified) Outpatient Encounter Medications as of 06/20/2021  Medication Sig    amLODipine (NORVASC) 10 MG tablet TAKE 1 TABLET BY MOUTH EVERY DAY   atenolol (TENORMIN) 100 MG tablet Take 1 tablet (100 mg total) by mouth daily.   ELIQUIS 5 MG TABS tablet TAKE 1 TABLET BY MOUTH TWICE A DAY   hydrochlorothiazide (HYDRODIURIL) 25 MG tablet TAKE 1 TABLET BY MOUTH EVERY DAY (Patient taking differently: 12.5 mg.)   losartan (COZAAR) 100 MG tablet TAKE 1 TABLET BY MOUTH EVERY DAY   naproxen sodium (ALEVE) 220 MG tablet Take 220 mg by mouth daily as needed (pain).   No facility-administered encounter medications on file as of 06/20/2021.    Allergies (verified) Patient has no known allergies.   History: Past Medical History:  Diagnosis Date   Arthritis    knee   CLL (chronic lymphocytic leukemia) ([place]) 11/27/2013   Hypertension    Ulcer    stomach ulcer when young   Past Surgical History:  Procedure Laterality Date   APPENDECTOMY     catarat Right 07/23/2019   catarat Left 08/31/2019   TONSILLECTOMY AND ADENOIDECTOMY     Family History  Problem Relation Age of Onset   Healthy Mother        died age 67 (communist country could not get records)   Other Father        died young-unclear cause   Other Sister        died in 68 "old age. hard to get answers in [place]   Other Sister        14,  hard  to get answers in [place]- possible cancer- rectal or colorectal?   Other Sister        died at 27- unclear cause   Social History   Socioeconomic History   Marital status: Married    Spouse name: Not on file   Number of children: Not on file   Years of education: Not on file   Highest education level: Not on file  Occupational History   Occupation: retired  Tobacco Use   Smoking status: Former    Packs/day: 0.20    Years: 20.00    Pack years: 4.00    Types: Cigarettes    Quit date: 09/21/1978    Years since quitting: 42.7   Smokeless tobacco: Never  Substance and Sexual Activity   Alcohol use: Not Currently    Alcohol/week: 7.0 standard drinks    Types: 7  Standard drinks or equivalent per week   Drug use: No   Sexual activity: Not on file  Other Topics Concern   Not on file  Social History Narrative   Widowed April 2016. 3 children. 7 grandkids. 3 greatgrandkids.   Husband worked as Chief Executive Officer.       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      Hobbies: travel- husband was buried at home ([place])   Social Determinants of Radio broadcast assistant Strain: Low Risk    Difficulty of Paying Living Expenses: Not hard at all  Food Insecurity: No Food Insecurity   Worried About Charity fundraiser in the Last Year: Never true   Arboriculturist in the Last Year: Never true  Transportation Needs: No Transportation Needs   Lack of Transportation (Medical): No   Lack of Transportation (Non-Medical): No  Physical Activity: Insufficiently Active   Days of Exercise per Week: 1 day   Minutes of Exercise per Session: 20 min  Stress: No Stress Concern Present   Feeling of Stress : Not at all  Social Connections: Socially Isolated   Frequency of Communication with Friends and Family: Three times a week   Frequency of Social Gatherings with Friends and Family: Twice a week   Attends Religious Services: Never   Marine scientist or Organizations: No   Attends Archivist Meetings: Never   Marital Status: Widowed    Tobacco Counseling Counseling given: Not Answered   Clinical Intake:  Pre-visit preparation completed: Yes  Pain : No/denies pain     BMI - recorded: 27.87 Nutritional Status: BMI 25 -29 Overweight Nutritional Risks: None Diabetes: No  How often do you need to have someone help you when you read instructions, pamphlets, or other written materials from your doctor or pharmacy?: 1 - Never  Diabetic?No  Interpreter Needed?: No  Information entered by :: Charlott Rakes, LPN   Activities of Daily Living In your present state of health, do you have any difficulty  performing the following activities: 06/20/2021  Hearing? N  Vision? N  Difficulty concentrating or making decisions? N  Walking or climbing stairs? N  Comment don't climb  Dressing or bathing? N  Doing errands, shopping? N  Preparing Food and eating ? N  Using the Toilet? N  In the past six months, have you accidently leaked urine? N  Do you have problems with loss of bowel control? N  Managing your Medications? N  Managing your Finances? N  Housekeeping or managing your Housekeeping? N  Some recent data might be hidden  Patient Care Team: [name], MD as PCP - General (Family Medicine) Heath Lark, MD as Consulting Physician (Hematology and Oncology) Edythe Clarity, [facility] (Pharmacist)  Indicate any recent Medical Services you may have received from other than [facility] providers in the past year (date may be approximate).     Assessment:   This is a routine wellness examination for Maria Lowe.  Hearing/Vision screen Hearing Screening - Comments:: Pt denies any  hearing issues  Vision Screening - Comments:: Pt follows up with eye Dr For annual eye exams unsure of name   Dietary issues and exercise activities discussed: Current Exercise Habits: Home exercise routine, Type of exercise: walking, Time (Minutes): 20, Frequency (Times/Week): 1, Weekly Exercise (Minutes/Week): 20   Goals Addressed             This Visit's Progress    Patient Stated       None at this time        Depression Screen PHQ 2/9 Scores 06/20/2021 06/13/2021 11/29/2020 06/17/2020 05/30/2020 05/18/2019 05/18/2019  PHQ - 2 Score 0 1 0 0 0 0 0  PHQ- 9 Score - - - - 0 - -    Fall Risk Fall Risk  06/20/2021 06/13/2021 11/29/2020 06/17/2020 05/30/2020  Falls in the past year? 0 0 0 1 1  Number falls in past yr: 0 0 0 1 0  Injury with Fall? 0 0 0 0 0  Risk for fall due to : Impaired vision No Fall Risks - Impaired balance/gait Impaired balance/gait  Follow up Falls prevention discussed Falls evaluation  completed - Falls prevention discussed -    FALL RISK PREVENTION PERTAINING TO THE HOME:  Any stairs in or around the home? No  If so, are there any without handrails? No  Home free of loose throw rugs in walkways, pet beds, electrical cords, etc? Yes  Adequate lighting in your home to reduce risk of falls? Yes   ASSISTIVE DEVICES UTILIZED TO PREVENT FALLS:  Life alert? Yes  Use of a cane, walker or w/c? Yes  Grab bars in the bathroom? Yes  Shower chair or bench in shower? Yes  Elevated toilet seat or a handicapped toilet? No   TIMED UP AND GO:  Was the test performed? No .   Cognitive Function: MMSE - Mini Mental State Exam 04/07/2018  Not completed: (No Data)     6CIT Screen 06/20/2021 06/17/2020 11/23/2019 05/18/2019  What Year? 0 points 0 points 0 points 0 points  What month? 0 points 0 points - 0 points  What time? 0 points - - 0 points  Count back from 20 0 points 0 points - 0 points  Months in reverse 0 points 0 points - 0 points  Repeat phrase 0 points 6 points - 0 points  Total Score 0 - - 0    Immunizations Immunization History  Administered Date(s) Administered   Influenza Whole 06/30/2013   Influenza, High Dose Seasonal PF 06/25/2016, 06/21/2019   Influenza,inj,Quad PF,6+ Mos 06/13/2014   Influenza-Unspecified 07/12/2015, 06/09/2017, 06/21/2020   PFIZER Comirnaty(Gray Top)Covid-19 Tri-Sucrose Vaccine 01/30/2021   PFIZER(Purple Top)SARS-COV-2 Vaccination 12/13/2019, 12/30/2019, 07/12/2020   Pneumococcal Conjugate-13 06/25/2016   Pneumococcal Polysaccharide-23 06/21/2013   Td 04/07/2018    TDAP status: Up to date  Flu Vaccine status: Due, Education has been provided regarding the importance of this vaccine. Advised may receive this vaccine at local pharmacy or Health Dept. Aware to provide a copy of the vaccination record if obtained from local pharmacy or  Health Dept. Verbalized acceptance and understanding.  Pneumococcal vaccine status: Up to  date  Covid-19 vaccine status: Completed vaccines  Qualifies for Shingles Vaccine? Yes   Zostavax completed No   Shingrix Completed?: No.    Education has been provided regarding the importance of this vaccine. Patient has been advised to call insurance company to determine out of pocket expense if they have not yet received this vaccine. Advised may also receive vaccine at local pharmacy or Health Dept. Verbalized acceptance and understanding.  Screening Tests Health Maintenance  Topic Date Due   Zoster Vaccines- Shingrix (1 of 2) Never done   INFLUENZA VACCINE  04/21/2021   COVID-19 Vaccine (5 - Booster for [place] series) 06/02/2021   DEXA SCAN  10/23/2043 (Originally 06/21/1998)   TETANUS/TDAP  04/07/2028   HPV VACCINES  Aged Out    Health Maintenance  Health Maintenance Due  Topic Date Due   Zoster Vaccines- Shingrix (1 of 2) Never done   INFLUENZA VACCINE  04/21/2021   COVID-19 Vaccine (5 - Booster for Pf[employer] series) 06/02/2021    Colorectal cancer screening: No longer required.   Mammogram status: No longer required due to age.    Additional Screening:  Vision Screening: Recommended annual ophthalmology exams for early detection of glaucoma and other disorders of the eye. Is the patient up to date with their annual eye exam?  Yes  Who is the provider or what is the name of the office in which the patient attends annual eye exams? Unsure of providers name  If pt is not established with a provider, would they like to be referred to a provider to establish care? No .   Dental Screening: Recommended annual dental exams for proper oral hygiene  Community Resource Referral / Chronic Care Management: CRR required this visit?  No   CCM required this visit?  No      Plan:     I have personally reviewed and noted the following in the patient's chart:   Medical and social history Use of alcohol, tobacco or illicit drugs  Current medications and supplements including  opioid prescriptions.  Functional ability and status Nutritional status Physical activity Advanced directives List of other physicians Hospitalizations, surgeries, and ER visits in previous 12 months Vitals Screenings to include cognitive, depression, and falls Referrals and appointments  In addition, I have reviewed and discussed with patient certain preventive protocols, quality metrics, and best practice recommendations. A written personalized care plan for preventive services as well as general preventive health recommendations were provided to patient.     Willette Brace, LPN   10/05/7260   Nurse Notes: None

## 2021-06-20 NOTE — Patient Instructions (Signed)
Maria Lowe , Thank you for taking time to come for your Medicare Wellness Visit. I appreciate your ongoing commitment to your health goals. Please review the following plan we discussed and let me know if I can assist you in the future.   Screening recommendations/referrals: Colonoscopy: no longer required  Mammogram: No longer required  Bone Density: No longer required  Recommended yearly ophthalmology/optometry visit for glaucoma screening and checkup Recommended yearly dental visit for hygiene and checkup  Vaccinations: Influenza vaccine: Due  Pneumococcal vaccine: Completed  Tdap vaccine: Done 04/07/18 repeat every 10 years  Shingles vaccine: Shingrix discussed. Please contact your pharmacy for coverage information.    Covid-19:Completed 3/24, 4/10, 10/22, & 01/30/21  Advanced directives: Advance directive discussed with you today. Even though you declined this today please call our office should you change your mind and we can give you the proper paperwork for you to fill out.  Conditions/risks identified: none at this time   Next appointment: Follow up in one year for your annual wellness visit    Preventive Care 65 Years and Older, Female Preventive care refers to lifestyle choices and visits with your health care provider that can promote health and wellness. What does preventive care include? A yearly physical exam. This is also called an annual well check. Dental exams once or twice a year. Routine eye exams. Ask your health care provider how often you should have your eyes checked. Personal lifestyle choices, including: Daily care of your teeth and gums. Regular physical activity. Eating a healthy diet. Avoiding tobacco and drug use. Limiting alcohol use. Practicing safe sex. Taking low-dose aspirin every day. Taking vitamin and mineral supplements as recommended by your health care provider. What happens during an annual well check? The services and screenings done  by your health care provider during your annual well check will depend on your age, overall health, lifestyle risk factors, and family history of disease. Counseling  Your health care provider may ask you questions about your: Alcohol use. Tobacco use. Drug use. Emotional well-being. Home and relationship well-being. Sexual activity. Eating habits. History of falls. Memory and ability to understand (cognition). Work and work Statistician. Reproductive health. Screening  You may have the following tests or measurements: Height, weight, and BMI. Blood pressure. Lipid and cholesterol levels. These may be checked every 5 years, or more frequently if you are over 75 years old. Skin check. Lung cancer screening. You may have this screening every year starting at age 79 if you have a 30-pack-year history of smoking and currently smoke or have quit within the past 15 years. Fecal occult blood test (FOBT) of the stool. You may have this test every year starting at age 64. Flexible sigmoidoscopy or colonoscopy. You may have a sigmoidoscopy every 5 years or a colonoscopy every 10 years starting at age 17. Hepatitis C blood test. Hepatitis B blood test. Sexually transmitted disease (STD) testing. Diabetes screening. This is done by checking your blood sugar (glucose) after you have not eaten for a while (fasting). You may have this done every 1-3 years. Bone density scan. This is done to screen for osteoporosis. You may have this done starting at age 27. Mammogram. This may be done every 1-2 years. Talk to your health care provider about how often you should have regular mammograms. Talk with your health care provider about your test results, treatment options, and if necessary, the need for more tests. Vaccines  Your health care provider may recommend certain vaccines, such as:  Influenza vaccine. This is recommended every year. Tetanus, diphtheria, and acellular pertussis (Tdap, Td) vaccine. You  may need a Td booster every 10 years. Zoster vaccine. You may need this after age 2. Pneumococcal 13-valent conjugate (PCV13) vaccine. One dose is recommended after age 65. Pneumococcal polysaccharide (PPSV23) vaccine. One dose is recommended after age 28. Talk to your health care provider about which screenings and vaccines you need and how often you need them. This information is not intended to replace advice given to you by your health care provider. Make sure you discuss any questions you have with your health care provider. Document Released: 10/04/2015 Document Revised: 05/27/2016 Document Reviewed: 07/09/2015 Elsevier Interactive Patient Education  2017 [place] Prevention in the Home Falls can cause injuries. They can happen to people of all ages. There are many things you can do to make your home safe and to help prevent falls. What can I do on the outside of my home? Regularly fix the edges of walkways and driveways and fix any cracks. Remove anything that might make you trip as you walk through a door, such as a raised step or threshold. Trim any bushes or trees on the path to your home. Use bright outdoor lighting. Clear any walking paths of anything that might make someone trip, such as rocks or tools. Regularly check to see if handrails are loose or broken. Make sure that both sides of any steps have handrails. Any raised decks and porches should have guardrails on the edges. Have any leaves, snow, or ice cleared regularly. Use sand or salt on walking paths during winter. Clean up any spills in your garage right away. This includes oil or grease spills. What can I do in the bathroom? Use night lights. Install grab bars by the toilet and in the tub and shower. Do not use towel bars as grab bars. Use non-skid mats or decals in the tub or shower. If you need to sit down in the shower, use a plastic, non-slip stool. Keep the floor dry. Clean up any water that spills  on the floor as soon as it happens. Remove soap buildup in the tub or shower regularly. Attach bath mats securely with double-sided non-slip rug tape. Do not have throw rugs and other things on the floor that can make you trip. What can I do in the bedroom? Use night lights. Make sure that you have a light by your bed that is easy to reach. Do not use any sheets or blankets that are too big for your bed. They should not hang down onto the floor. Have a firm chair that has side arms. You can use this for support while you get dressed. Do not have throw rugs and other things on the floor that can make you trip. What can I do in the kitchen? Clean up any spills right away. Avoid walking on wet floors. Keep items that you use a lot in easy-to-reach places. If you need to reach something above you, use a strong step stool that has a grab bar. Keep electrical cords out of the way. Do not use floor polish or wax that makes floors slippery. If you must use wax, use non-skid floor wax. Do not have throw rugs and other things on the floor that can make you trip. What can I do with my stairs? Do not leave any items on the stairs. Make sure that there are handrails on both sides of the stairs and use them.  Fix handrails that are broken or loose. Make sure that handrails are as long as the stairways. Check any carpeting to make sure that it is firmly attached to the stairs. Fix any carpet that is loose or worn. Avoid having throw rugs at the top or bottom of the stairs. If you do have throw rugs, attach them to the floor with carpet tape. Make sure that you have a light switch at the top of the stairs and the bottom of the stairs. If you do not have them, ask someone to add them for you. What else can I do to help prevent falls? Wear shoes that: Do not have high heels. Have rubber bottoms. Are comfortable and fit you well. Are closed at the toe. Do not wear sandals. If you use a stepladder: Make  sure that it is fully opened. Do not climb a closed stepladder. Make sure that both sides of the stepladder are locked into place. Ask someone to hold it for you, if possible. Clearly mark and make sure that you can see: Any grab bars or handrails. First and last steps. Where the edge of each step is. Use tools that help you move around (mobility aids) if they are needed. These include: Canes. Walkers. Scooters. Crutches. Turn on the lights when you go into a dark area. Replace any light bulbs as soon as they burn out. Set up your furniture so you have a clear path. Avoid moving your furniture around. If any of your floors are uneven, fix them. If there are any pets around you, be aware of where they are. Review your medicines with your doctor. Some medicines can make you feel dizzy. This can increase your chance of falling. Ask your doctor what other things that you can do to help prevent falls. This information is not intended to replace advice given to you by your health care provider. Make sure you discuss any questions you have with your health care provider. Document Released: 07/04/2009 Document Revised: 02/13/2016 Document Reviewed: 10/12/2014 Elsevier Interactive Patient Education  2017 [employer].

## 2021-06-21 ENCOUNTER — Other Ambulatory Visit: Payer: Self-pay | Admitting: Family Medicine

## 2021-06-24 ENCOUNTER — Other Ambulatory Visit: Payer: Self-pay

## 2021-06-24 ENCOUNTER — Encounter: Payer: Self-pay | Admitting: Obstetrics and Gynecology

## 2021-06-24 ENCOUNTER — Ambulatory Visit: Payer: PPO | Admitting: Obstetrics and Gynecology

## 2021-06-24 VITALS — BP 124/70 | Ht 62.0 in | Wt 154.0 lb

## 2021-06-24 DIAGNOSIS — D6869 Other thrombophilia: Secondary | ICD-10-CM | POA: Diagnosis not present

## 2021-06-24 DIAGNOSIS — I10 Essential (primary) hypertension: Secondary | ICD-10-CM

## 2021-06-24 DIAGNOSIS — I4821 Permanent atrial fibrillation: Secondary | ICD-10-CM

## 2021-06-24 DIAGNOSIS — N952 Postmenopausal atrophic vaginitis: Secondary | ICD-10-CM

## 2021-06-24 DIAGNOSIS — N183 Chronic kidney disease, stage 3 unspecified: Secondary | ICD-10-CM | POA: Diagnosis not present

## 2021-06-24 DIAGNOSIS — C9110 Chronic lymphocytic leukemia of B-cell type not having achieved remission: Secondary | ICD-10-CM | POA: Diagnosis not present

## 2021-06-24 DIAGNOSIS — N816 Rectocele: Secondary | ICD-10-CM

## 2021-06-24 MED ORDER — ESTRADIOL 0.1 MG/GM VA CREA: TOPICAL_CREAM | VAGINAL | 0 refills | Status: DC

## 2021-06-24 NOTE — Progress Notes (Signed)
87 y.o. [id number] Widowed White or Caucasian Not Hispanic or Latino female here for Uterine prolapse. She first noticed the prolapse ~6 years ago. Initially just noticed it when she was washing. It has gotten worse and become bothersome in the last 6 weeks. She can feel it when she is sitting. Some pressure when she is walking.  Normal urinary flow. She has a small amount of urge incontinence a few x a week. Voids normal amounts. No GSI. No issues with BM. Not sexually active, no vaginal bleeding.        No LMP recorded. Patient is postmenopausal.          Sexually active: No.  The current method of family planning is post menopausal status.    Exercising: Yes.     Walking  Smoker:  no  Health Maintenance: Pap:  not sure Patient thinks about 15 years ago  History of abnormal Pap:  no MMG:  about 15 years ago  BMD:   none  Colonoscopy: 15 years ago  TDaP:  04/07/18 Gardasil: na   reports that she quit smoking about 42 years ago. Her smoking use included cigarettes. She has a 4.00 pack-year smoking history. She has never used smokeless tobacco. She reports that she does not currently use alcohol after a past usage of about 7.0 standard drinks per week. She reports that she does not use drugs. She is from [place], moved here 60 years ago. 2 kids are local, one in [place]. 7 grand children. Husband died 8 years ago after 67 years of marriage.   Past Medical History:  Diagnosis Date   Arthritis    knee   CLL (chronic lymphocytic leukemia) ([place]) 11/27/2013   Hypertension    Ulcer    stomach ulcer when young   Urinary incontinence     Past Surgical History:  Procedure Laterality Date   APPENDECTOMY     catarat Right 07/23/2019   catarat Left 08/31/2019   TONSILLECTOMY AND ADENOIDECTOMY      Current Outpatient Medications  Medication Sig Dispense Refill   amLODipine (NORVASC) 10 MG tablet TAKE 1 TABLET BY MOUTH EVERY DAY 90 tablet 3   atenolol (TENORMIN) 100 MG tablet Take 1 tablet  (100 mg total) by mouth daily. 90 tablet 3   ELIQUIS 5 MG TABS tablet TAKE 1 TABLET BY MOUTH TWICE A DAY 60 tablet 5   hydrochlorothiazide (HYDRODIURIL) 25 MG tablet TAKE 1 TABLET BY MOUTH EVERY DAY (Patient taking differently: 12.5 mg.) 90 tablet 3   losartan (COZAAR) 100 MG tablet TAKE 1 TABLET BY MOUTH EVERY DAY 90 tablet 3   naproxen sodium (ALEVE) 220 MG tablet Take 220 mg by mouth daily as needed (pain).     No current facility-administered medications for this visit.    Family History  Problem Relation Age of Onset   Healthy Mother        died age 23 (communist country could not get records)   Other Father        died young-unclear cause   Other Sister        died in 51 "old age. hard to get answers in [place]   Other Sister        80,  hard to get answers in [place]- possible cancer- rectal or colorectal?   Other Sister        died at 24- unclear cause    Review of Systems  All other systems reviewed and are negative.  Exam:  BP 124/70   Ht 5\' 2"  (1.575 m)   Wt 154 lb (69.9 kg)   BMI 28.17 kg/m   Weight change: @WEIGHTCHANGE @ Height:   Height: 5\' 2"  (157.5 cm)  Ht Readings from Last 3 Encounters:  06/24/21 5\' 2"  (1.575 m)  06/17/21 5\' 2"  (1.575 m)  06/13/21 5\' 2"  (1.575 m)    General appearance: alert, cooperative and appears stated age Head: Normocephalic, without obvious abnormality, atraumatic Lungs: clear to auscultation bilaterally Cardiovascular: regular rate and rhythm Abdomen: soft, non-tender; non distended,  no masses,  no organomegaly Extremities: extremities normal, atraumatic, no cyanosis or edema Skin: Skin color, texture, turgor normal. No rashes or lesions Lymph nodes: Cervical, supraclavicular, and axillary nodes normal. No abnormal inguinal nodes palpated Neurologic: Grossly normal   Pelvic: External genitalia:  no lesions              Urethra:  normal appearing urethra with no masses, tenderness or lesions              Bartholins and  Skenes: normal                 Vagina: atrophic vaginal mucosa. Grade 2-3 rectocele, no other significant prolapse              Cervix:  small cervical polyp               Bimanual Exam:  Uterus:   no masses or tenderness              Adnexa: no mass, fullness, tenderness               Rectovaginal: Confirms               Anus:  normal sphincter tone, no lesions  Gae Dry chaperoned for the exam.   1. Rectocele Discussed options: do nothing, pessary, surgery. She desires surgery. Discussed rectocele repair, including risks of infection, bleeding, damage to rectum. Risk of recurrent rectocele are small. Discussed avoiding constipation and heavy lifting.   2. Vaginal atrophy Will pretreat with vaginal estrogen.  - estradiol (ESTRACE) 0.1 MG/GM vaginal cream; 1 gram vaginally every night for the 2 weeks prior to surgery  Dispense: 42.5 g; Refill: 0  3. Permanent atrial fibrillation (HCC) Needs medical clearance  4. Secondary hypercoagulable state ([place]) Typically stop Eliquis 2 days prior to surgery and restart 1-2 days after surgery. Will confirm recommendation with Cardiologist  5. Stage 3 chronic kidney disease, unspecified whether stage 3a or 3b CKD (HCC) GFR from 4/22 was 50.64  6. CLL (chronic lymphocytic leukemia) ([place])  7. Essential hypertension, benign  CC: Dr Yong Channel, Dr Alvy Bimler, Malka So, PA  Over 30 minutes in total patient care.

## 2021-06-24 NOTE — Patient Instructions (Signed)

## 2021-06-25 ENCOUNTER — Encounter: Payer: Self-pay | Admitting: Obstetrics and Gynecology

## 2021-06-25 ENCOUNTER — Telehealth: Payer: Self-pay | Admitting: Obstetrics and Gynecology

## 2021-06-25 NOTE — Telephone Encounter (Signed)
Surgery: Rectocele repair  Diagnosis: rectocele  Location: [facility]  Status: Outpatient with Overnight Bed  Time: 83 Minutes  Assistant: Josefa Half, MD, FACOG  Urgency: First Available  Pre-Op Appointment: Completed  Post-Op Appointment(s): 1 Week, 4 Weeks  Time Out Of Work:  not working  The patient has CLL, afib (on Eloquis), kidney disease and HTN. Needs preop clearance from primary/cardiology. I need recommendations for holding her eloquis with her mildly abnormal GFR of 50.   The patient wants this done as soon as possible.

## 2021-06-26 ENCOUNTER — Telehealth: Payer: Self-pay

## 2021-06-26 NOTE — Telephone Encounter (Signed)
Due to low blood pressures please stop the hydrochlorothiazide completely instead of taking 12.5 mg.  She can keep the bottle in case we have to restart in the future but certainly separate from her current meds and do not take at this time  Please confirm she is still on the following Amlodipine 10Mg  daily, Atenolol 100Mg  daily, losartan 100 mg daily  Have her update me in another week with home blood pressures-I may need to further reduce.

## 2021-06-26 NOTE — Telephone Encounter (Signed)
See below

## 2021-06-26 NOTE — Telephone Encounter (Signed)
Patient has called in stating Dr. Yong Channel had changed her BP meds at her last OV.  Patient states she was advised to call in to inform Dr. Yong Channel how the medication was going.  Highest BP is 127/63    Lowest BP 76/48   Average - 110/69  States all readings are taken at different times during the day after she has taken her BP meds.  Patient takes BP meds between 6 and 7am every morning.    Sept 28th - 127/63 Sept 29th - 76/48 Sept 30th - 110/69 Oct 1st -118/71 Oct 2nd - 107/74 Oct 3rd - 90/65 Oct 4th - 110/77 Oct 5th - 118/71 Oct 6th - 110/69  Please follow back up in regard with patient.

## 2021-06-27 ENCOUNTER — Telehealth: Payer: Self-pay

## 2021-06-27 NOTE — Telephone Encounter (Signed)
Spoke with patient. Reviewed surgery dates. Patient request to proceed with surgery on 07/22/21.   I will return call once surgery date and time confirmed. Advised patient I will request surgical clearance from PCP and return call with any additional recommendations.  Patient verbalizes understanding and is agreeable.   Surgery request sent.   Call placed to [facility]/ Dr. Yong Channel: [phone]. Left message, Surgical clearance requested with recommendations for eloquis.

## 2021-06-27 NOTE — Telephone Encounter (Signed)
Called and spoke with pt and made pt aware of below message.

## 2021-06-27 NOTE — Telephone Encounter (Signed)
From a medical perspective patient may proceed with surgery once systolic blood pressures at home are no longer dipping below 100-see prior notes as we are adjusting medicine  We had a conversation through messaging with cardiology and they did not recommend further cardiac work-up as this was a low risk procedure-they will need to formally provide clearance from cardiac perspective though

## 2021-06-27 NOTE — Telephone Encounter (Signed)
Spoke with patient regarding surgery benefits. Patient acknowledges understanding of information presented. Patient is aware that benefits presented are professional benefits only. Patient is aware that once surgery is scheduled, the hospital will call with separate benefits. See account note.  Routing to Jill Hamm, RN, for surgery scheduling. 

## 2021-06-27 NOTE — Telephone Encounter (Signed)
Sharee Pimple from [facility] called for verbal orders regarding surgical clearance. Sharee Pimple would like a call back at [phone]. Please Advise.

## 2021-06-27 NOTE — Telephone Encounter (Signed)
Not sure if you are aware of surgery clearance, see prior phone note from Dr. Joycelyn Rua.

## 2021-06-30 ENCOUNTER — Other Ambulatory Visit: Payer: Self-pay

## 2021-06-30 DIAGNOSIS — I4891 Unspecified atrial fibrillation: Secondary | ICD-10-CM

## 2021-06-30 DIAGNOSIS — I4819 Other persistent atrial fibrillation: Secondary | ICD-10-CM

## 2021-06-30 DIAGNOSIS — I10 Essential (primary) hypertension: Secondary | ICD-10-CM

## 2021-06-30 NOTE — Telephone Encounter (Signed)
Returned Jills call and she will contact cardiology and the pt.

## 2021-06-30 NOTE — Telephone Encounter (Signed)
PCP has placed referral to cardiology.

## 2021-06-30 NOTE — Telephone Encounter (Signed)
Spoke with patient. Updated provided.  Patient will notify office when Cardiology appt scheduled. Patient is aware surgery may need to be rescheduled to later date if clearance not obtained from PCP and Cardiology prior to 07/22/21. Patient verbalizes understanding and is agreeable.   Routing to Dr. Rosann Auerbach.

## 2021-06-30 NOTE — Telephone Encounter (Signed)
Spoke with Bevelyn Ngo, CMA/ Dr. Yong Channel.   [name], MD to [facility]     4:30 PM Note From a medical perspective patient may proceed with surgery once systolic blood pressures at home are no longer dipping below 100-see prior notes as we are adjusting medicine   We had a conversation through messaging with cardiology and they did not recommend further cardiac work-up as this was a low risk procedure-they will need to formally provide clearance from cardiac perspective though     Patient will be sending in her BP readings on Wednesday or Friday of this week, can provide update once received and reviewed by PCP. Cardiology clearance needed.    Per review of Epic, patient has been seen at [facility], last OV 01/23/21. Call placed,  spoke with Dawn, unable to provide cardiology clearance, will need to see cardiologist.    Message to PCP for recommendations regarding cardiology referral.

## 2021-07-07 ENCOUNTER — Telehealth: Payer: Self-pay

## 2021-07-07 NOTE — Telephone Encounter (Signed)
See readings below.

## 2021-07-07 NOTE — Telephone Encounter (Signed)
Patient has called in to give BP report:  10/7 - (did not take meds) 143/82  10/8 - (did not take meds) 142/94  10/9 - (1/2 pill) 120/81  10/10 - (1/2 pill) 125/79  10/11 - (did not take med) 142/85  10/12 - (did not take med) 140/87  10/13 - (1/2 pill) - 129/72  10/14 - (1/2 pill) - 127/75  10/15 - (1/2 pill) - 118/71  10/16 - (did not take med) 141/85  10/17 - (1/2 pill) - 121/74   Also, would like a call back with why she was referred to Cardiology.

## 2021-07-07 NOTE — Telephone Encounter (Signed)
Spoke with patient.  Patient is scheduled to see Cardiology on 08/12/21. Advised will need to r/s surgery to later date, will need cardiac clearance. Reviewed surgery dates, patient request to proceed with 08/25/21. Advised I will call to r/s and f/u to confirm.   Spoke with Sharee Pimple in [place], surgery r/s to 08/25/21 at 1104.   Routing to [employer]

## 2021-07-07 NOTE — Telephone Encounter (Signed)
She seems to be doing reasonably well even without the hydrochlorothiazide-we have been trying to keep her blood pressure under 150 due to lightheadedness issues-thankfully even with 1/2 tablet blood pressures have been better-I am okay with her taking hydrochlorothiazide half tablet as needed if blood pressure gets above 145 perhaps so 12.5 mg total  I believe she was referred to cardiology for cardiac clearance for surgery-if cardiology does not need an office visit for clearance then she does not have to see them

## 2021-07-07 NOTE — Telephone Encounter (Signed)
Patient gave a verbal understanding to everything.

## 2021-08-05 NOTE — Telephone Encounter (Signed)
Spoke with patient. Reviewed surgery planning for 08/25/21. Patient is scheduled to see cardiology on 08/12/21, will discuss Eloquis recommendations at that visit.   Pre-op scheduled with Dr. Talbert Nan for 11/28 at 11am.

## 2021-08-10 NOTE — Progress Notes (Signed)
Cardiology Office Note:   Date:  08/12/2021  NAME:  Kenda Kloehn Bieser    MRN: [id number] DOB:  05/24/1931   PCP:  [name], MD  Cardiologist:  None  Electrophysiologist:  None   Referring MD: Salvadore Dom, MD   Chief Complaint  Patient presents with   Atrial Fibrillation    History of Present Illness:   Brendi S Bourdeau is a 87 y.o. female with a hx of CLL, Afib, HTN who is being seen today for the evaluation of atrial fibrillation/preoperative assessment at the request of [name], MD. Diagnosed with atrial fibrillation in March of 2022. Seen by Afib clinic.  She is doing well.  Decided on rate control strategy with A. fib clinic.  Echocardiogram shows normal LV function moderate mitral regurgitation.  She has no symptoms.  Heart rate 88 today.  She is on Eliquis.  Blood pressure 138/72.  No recent falls.  She denies any chest pain or trouble breathing.  She is quite active for 87 years of age.  She informs me she can walk around [employer] twice without chest pain or trouble breathing.  Medical history significant for hypertension as well as CLL.  Her CLL is well controlled and requires no treatment specifically.  She still lives alone.  She does not drive.  She still cooks and cleans.  She can bathe and dress herself without limitations.  There is no memory impairment.  She is a former smoker but quit in the 1980s.  She denies any alcohol use in excess or drug use.  She is widowed.  She has 3 children.  She has several grandchildren and great-grandchildren.  She really is unaware of her atrial fibrillation.  It does not seem to bother her.  I do agree with a rate control strategy.  No recent TSH in our system.  She will have a hysterectomy performed.  Apparently she has uterine prolapse.  There is no bleeding concerns per her report.  She presents with her daughter.  Problem List Persistent Afib -CHADSVASC=4 (age, female, HTN) 2. HTN 3. HLD 4. CLL  Past Medical  History: Past Medical History:  Diagnosis Date   Arthritis    knee   CLL (chronic lymphocytic leukemia) ([place]) 11/27/2013   Hypertension    Ulcer    stomach ulcer when young   Urinary incontinence     Past Surgical History: Past Surgical History:  Procedure Laterality Date   APPENDECTOMY     catarat Right 07/23/2019   catarat Left 08/31/2019   TONSILLECTOMY AND ADENOIDECTOMY      Current Medications: Current Meds  Medication Sig   amLODipine (NORVASC) 10 MG tablet TAKE 1 TABLET BY MOUTH EVERY DAY   atenolol (TENORMIN) 100 MG tablet TAKE 1 TABLET BY MOUTH EVERY DAY   ELIQUIS 5 MG TABS tablet TAKE 1 TABLET BY MOUTH TWICE A DAY   estradiol (ESTRACE) 0.1 MG/GM vaginal cream 1 gram vaginally every night for the 2 weeks prior to surgery   hydrochlorothiazide (HYDRODIURIL) 25 MG tablet TAKE 1 TABLET BY MOUTH EVERY DAY (Patient taking differently: 12.5 mg.)   losartan (COZAAR) 100 MG tablet TAKE 1 TABLET BY MOUTH EVERY DAY   naproxen sodium (ALEVE) 220 MG tablet Take 220 mg by mouth daily as needed (pain).     Allergies:    Patient has no known allergies.   Social History: Social History   Socioeconomic History   Marital status: Widowed    Spouse name: Not on  file   Number of children: 3   Years of education: Not on file   Highest education level: Not on file  Occupational History   Occupation: retired  Tobacco Use   Smoking status: Former    Packs/day: 0.20    Years: 20.00    Pack years: 4.00    Types: Cigarettes    Quit date: 09/21/1978    Years since quitting: 42.9   Smokeless tobacco: Never  Substance and Sexual Activity   Alcohol use: Not Currently    Alcohol/week: 7.0 standard drinks    Types: 7 Standard drinks or equivalent per week   Drug use: No   Sexual activity: Not Currently    Birth control/protection: None, Post-menopausal  Other Topics Concern   Not on file  Social History Narrative   Widowed April 2016. 3 children. 7 grandkids. 3 greatgrandkids.    Husband worked as Chief Executive Officer.       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      Hobbies: travel- husband was buried at home ([place])   Social Determinants of Radio broadcast assistant Strain: Low Risk    Difficulty of Paying Living Expenses: Not hard at all  Food Insecurity: No Food Insecurity   Worried About Charity fundraiser in the Last Year: Never true   Arboriculturist in the Last Year: Never true  Transportation Needs: No Transportation Needs   Lack of Transportation (Medical): No   Lack of Transportation (Non-Medical): No  Physical Activity: Insufficiently Active   Days of Exercise per Week: 1 day   Minutes of Exercise per Session: 20 min  Stress: No Stress Concern Present   Feeling of Stress : Not at all  Social Connections: Socially Isolated   Frequency of Communication with Friends and Family: Three times a week   Frequency of Social Gatherings with Friends and Family: Twice a week   Attends Religious Services: Never   Marine scientist or Organizations: No   Attends Archivist Meetings: Never   Marital Status: Widowed     Family History: The patient's family history includes Healthy in her mother; Other in her father, sister, sister, and sister.  ROS:   All other ROS reviewed and negative. Pertinent positives noted in the HPI.     EKGs/Labs/Other Studies Reviewed:   The following studies were personally reviewed by me today:  EKG:  EKG is ordered today.  The ekg ordered today demonstrates atrial fibrillation heart rate 88, no acute ischemic changes, old anteroseptal infarct, and was personally reviewed by me.   TTE 01/23/2021  1. Left ventricular ejection fraction, by estimation, is 60 to 65%. The  left ventricle has normal function. The left ventricle has no regional  wall motion abnormalities. Left ventricular diastolic parameters are  indeterminate.   2. Right ventricular systolic function is normal.  The right ventricular  size is normal. There is normal pulmonary artery systolic pressure.   3. Left atrial size was mildly dilated.   4. The mitral valve is normal in structure. Moderate mitral valve  regurgitation. No evidence of mitral stenosis.   5. The aortic valve is normal in structure. Aortic valve regurgitation is  trivial. No aortic stenosis is present.   6. The inferior vena cava is normal in size with greater than 50%  respiratory variability, suggesting right atrial pressure of 3 mmHg.   Recent Labs: 05/30/2021: ALT 18; BUN 23; Creatinine, Ser 0.92; Potassium 3.6;  Sodium 138 06/17/2021: Hemoglobin 14.2; Platelets 142   Recent Lipid Panel    Component Value Date/Time   CHOL 212 (H) 05/30/2020 1419   TRIG 189 (H) 05/30/2020 1419   HDL 69 05/30/2020 1419   CHOLHDL 3.1 05/30/2020 1419   VLDL 28.4 05/18/2019 1329   LDLCALC 113 (H) 05/30/2020 1419    Physical Exam:   VS:  BP 138/72   Pulse 88   Ht 5' (1.524 m)   Wt 150 lb 9.6 oz (68.3 kg)   SpO2 96%   BMI 29.41 kg/m    Wt Readings from Last 3 Encounters:  08/12/21 150 lb 9.6 oz (68.3 kg)  06/24/21 154 lb (69.9 kg)  06/17/21 152 lb 6.4 oz (69.1 kg)    General: Well nourished, well developed, in no acute distress Head: Atraumatic, normal size  Eyes: PEERLA, EOMI  Neck: Supple, no JVD Endocrine: No thryomegaly Cardiac: Normal S1, S2; irregular rhythm, no murmurs rubs or gallops Lungs: Clear to auscultation bilaterally, no wheezing, rhonchi or rales  Abd: Soft, nontender, no hepatomegaly  Ext: No edema, pulses 2+ Musculoskeletal: No deformities, BUE and BLE strength normal and equal Skin: Warm and dry, no rashes   Neuro: Alert and oriented to person, place, time, and situation, CNII-XII grossly intact, no focal deficits  Psych: Normal mood and affect   ASSESSMENT:   Georga S Devall is a 87 y.o. female who presents for the following: 1. Permanent atrial fibrillation ([place])   2. Secondary hypercoagulable state  ([place])   3. Essential hypertension, benign   4. Preop cardiovascular exam     PLAN:   1. Permanent atrial fibrillation ([place]) 2. Secondary hypercoagulable state ([place]) -CHADSVASC=4.  On Eliquis.  No bleeding issues.  I agree with a rate control strategy.  This was decided with the A. fib clinic on her.  She has no symptoms from her atrial fibrillation.  Her LV function is normal.  Would recommend to continue with atenolol for rate control as well as anticoagulation. -We did discuss follow-up.  She reports she will follow-up in A. fib clinic.  She can easily follow here if needed.  3. Essential hypertension, benign -Well-controlled today.  No change to medications.  4. Preop cardiovascular exam -She reports she will have a hysterectomy due to uterine prolapse.  She can complete greater than 4 METS without any symptoms or chest pain or trouble breathing.  Her A. fib is rate controlled.  I recommended no further cardiac testing.  Echocardiogram shows normal LV function.  I have recommended that she hold her Eliquis 2 days prior to surgery.  She may resume her Eliquis at the discretion of her surgeon after her surgery is completed and her bleeding risk is low.  There is no rush to get back on Eliquis.  We will want her to do this safely with a very minimal bleeding risk.  I did reiterate this to her daughter and the patient in the room today.  Disposition: Return if symptoms worsen or fail to improve.  Medication Adjustments/Labs and Tests Ordered: Current medicines are reviewed at length with the patient today.  Concerns regarding medicines are outlined above.  Orders Placed This Encounter  Procedures   TSH   EKG 12-Lead   No orders of the defined types were placed in this encounter.   Patient Instructions  Medication Instructions:  The current medical regimen is effective;  continue present plan and medications.  *If you need a refill on your cardiac medications before your  next  appointment, please call your pharmacy*   Lab Work: TSH today   If you have labs (blood work) drawn today and your tests are completely normal, you will receive your results only by: [place] (if you have MyChart) OR A paper copy in the mail If you have any lab test that is abnormal or we need to change your treatment, we will call you to review the results.  Follow-Up: At [facility], you and your health needs are our priority.  As part of our continuing mission to provide you with exceptional heart care, we have created designated Provider Care Teams.  These Care Teams include your primary Cardiologist (physician) and Advanced Practice Providers (APPs -  Physician Assistants and Nurse Practitioners) who all work together to provide you with the care you need, when you need it.  We recommend signing up for the patient portal called "MyChart".  Sign up information is provided on this After Visit Summary.  MyChart is used to connect with patients for Virtual Visits (Telemedicine).  Patients are able to view lab/test results, encounter notes, upcoming appointments, etc.  Non-urgent messages can be sent to your provider as well.   To learn more about what you can do with MyChart, go to [email or web address].    Your next appointment:   As needed  The format for your next appointment:   In Person  Provider:   Eleonore Chiquito, MD      Signed, Addison Naegeli. Audie Box, MD, [place]  [address] [place], [place] [address] [phone]  08/12/2021 2:19 PM

## 2021-08-12 ENCOUNTER — Encounter: Payer: Self-pay | Admitting: Cardiovascular Disease

## 2021-08-12 ENCOUNTER — Other Ambulatory Visit: Payer: Self-pay

## 2021-08-12 ENCOUNTER — Telehealth: Payer: Self-pay | Admitting: *Deleted

## 2021-08-12 ENCOUNTER — Ambulatory Visit: Payer: PPO | Admitting: Cardiovascular Disease

## 2021-08-12 VITALS — BP 138/72 | HR 88 | Ht 60.0 in | Wt 150.6 lb

## 2021-08-12 DIAGNOSIS — I4821 Permanent atrial fibrillation: Secondary | ICD-10-CM | POA: Diagnosis not present

## 2021-08-12 DIAGNOSIS — Z01810 Encounter for preprocedural cardiovascular examination: Secondary | ICD-10-CM

## 2021-08-12 DIAGNOSIS — D6869 Other thrombophilia: Secondary | ICD-10-CM | POA: Diagnosis not present

## 2021-08-12 DIAGNOSIS — I10 Essential (primary) hypertension: Secondary | ICD-10-CM

## 2021-08-12 NOTE — Patient Instructions (Signed)
Medication Instructions:  °The current medical regimen is effective;  continue present plan and medications. ° °*If you need a refill on your cardiac medications before your next appointment, please call your pharmacy* ° ° °Lab Work: °TSH today  ° °If you have labs (blood work) drawn today and your tests are completely normal, you will receive your results only by: °• MyChart Message (if you have MyChart) OR °• A paper copy in the mail °If you have any lab test that is abnormal or we need to change your treatment, we will call you to review the results. ° °Follow-Up: °At [facility], you and your health needs are our priority.  As part of our continuing mission to provide you with exceptional heart care, we have created designated Provider Care Teams.  These Care Teams include your primary Cardiologist (physician) and Advanced Practice Providers (APPs -  Physician Assistants and Nurse Practitioners) who all work together to provide you with the care you need, when you need it. ° °We recommend signing up for the patient portal called "MyChart".  Sign up information is provided on this After Visit Summary.  MyChart is used to connect with patients for Virtual Visits (Telemedicine).  Patients are able to view lab/test results, encounter notes, upcoming appointments, etc.  Non-urgent messages can be sent to your provider as well.   °To learn more about what you can do with MyChart, go to [email or web address].com.   ° °Your next appointment:   °As needed ° °The format for your next appointment:   °In Person ° °Provider:   °[place] O'Neal, MD ° ° ° ° °

## 2021-08-12 NOTE — Telephone Encounter (Addendum)
Patient is scheduled for Rectocele repair on 08/25/21.   Cardiac clearance pending. Hc of CLL, afib (on Eloquis), kidney disease and HTN.    Dr. Talbert Nan -  See cardiology OV notes dated 08/12/21.  Bowel prep needed?

## 2021-08-13 LAB — TSH: TSH: 0.691 u[IU]/mL (ref 0.450–4.500)

## 2021-08-18 ENCOUNTER — Ambulatory Visit: Payer: PPO | Admitting: Obstetrics and Gynecology

## 2021-08-18 ENCOUNTER — Other Ambulatory Visit: Payer: Self-pay

## 2021-08-18 ENCOUNTER — Encounter: Payer: Self-pay | Admitting: Obstetrics and Gynecology

## 2021-08-18 VITALS — BP 110/80 | HR 91 | Ht 60.0 in | Wt 149.0 lb

## 2021-08-18 DIAGNOSIS — C9110 Chronic lymphocytic leukemia of B-cell type not having achieved remission: Secondary | ICD-10-CM

## 2021-08-18 DIAGNOSIS — N952 Postmenopausal atrophic vaginitis: Secondary | ICD-10-CM

## 2021-08-18 DIAGNOSIS — I10 Essential (primary) hypertension: Secondary | ICD-10-CM

## 2021-08-18 DIAGNOSIS — I4821 Permanent atrial fibrillation: Secondary | ICD-10-CM

## 2021-08-18 DIAGNOSIS — N816 Rectocele: Secondary | ICD-10-CM

## 2021-08-18 NOTE — Progress Notes (Signed)
GYNECOLOGY  VISIT   HPI: 87 y.o.   Widowed White or Caucasian Not Hispanic or Latino  female   [phone] with No LMP recorded. Patient is postmenopausal.   here for pre op visit for rectocele repair.   She started on vaginal estrogen cream last month.  Normal urinary flow. She has a small amount of urge incontinence a few x a week. Voids normal amounts. No GSI. No issues with BM. Not sexually active, no vaginal bleeding.    She has a h/o CLL, well controlled, no treatment. Cleared for surgery by Oncology.   H/O Afib, and HTN. She is on Eliquis. She was seen and cleared by Cardiology on 08/12/21, he recommended she stop her Eloquis 2 days prior to surgery.   Recent TSH 0.691.  CBC from 06/17/21 WBC of 21.5, Hgb 14.2, plt 142  Last CMP on 05/30/21, creatinine 0.92, GFR >60  GYNECOLOGIC HISTORY: No LMP recorded. Patient is postmenopausal. Contraception:pmp  Menopausal hormone therapy: yes estrace  cream.         OB History     Gravida  3   Para  3   Term  3   Preterm      AB      Living  3      SAB      IAB      Ectopic      Multiple      Live Births  3              Patient Active Problem List   Diagnosis Date Noted   Permanent atrial fibrillation ([place]) 04/24/2021   Secondary hypercoagulable state ([place]) 12/10/2020   Persistent atrial fibrillation ([place]) 11/29/2020   Hyperlipidemia, unspecified 04/07/2018   CKD (chronic kidney disease), stage III ([place]) 02/09/2018   Rash 02/09/2018   Seborrheic dermatitis 01/08/2017   Former smoker 09/30/2015   Uterine prolapse 03/29/2015   Thrombocytopenia ([place]) 11/26/2014   CLL (chronic lymphocytic leukemia) ([place]) 11/27/2013   Hyperglycemia 05/08/2013   Essential hypertension, benign 11/07/2012   Obesity, unspecified 11/07/2012    Past Medical History:  Diagnosis Date   Arthritis    knee   CLL (chronic lymphocytic leukemia) ([place]) 11/27/2013   Hypertension    Ulcer    stomach ulcer when young   Urinary  incontinence     Past Surgical History:  Procedure Laterality Date   APPENDECTOMY     catarat Right 07/23/2019   catarat Left 08/31/2019   TONSILLECTOMY AND ADENOIDECTOMY      Current Outpatient Medications  Medication Sig Dispense Refill   amLODipine (NORVASC) 10 MG tablet TAKE 1 TABLET BY MOUTH EVERY DAY 90 tablet 3   atenolol (TENORMIN) 100 MG tablet TAKE 1 TABLET BY MOUTH EVERY DAY 90 tablet 3   ELIQUIS 5 MG TABS tablet TAKE 1 TABLET BY MOUTH TWICE A DAY 60 tablet 5   estradiol (ESTRACE) 0.1 MG/GM vaginal cream 1 gram vaginally every night for the 2 weeks prior to surgery 42.5 g 0   hydrochlorothiazide (HYDRODIURIL) 25 MG tablet TAKE 1 TABLET BY MOUTH EVERY DAY (Patient taking differently: 12.5 mg.) 90 tablet 3   losartan (COZAAR) 100 MG tablet TAKE 1 TABLET BY MOUTH EVERY DAY 90 tablet 3   naproxen sodium (ALEVE) 220 MG tablet Take 220 mg by mouth daily as needed (pain).     No current facility-administered medications for this visit.   PATIENT is taking 1/2 a hydrochlorothiazide tablet prn SBP >145   ALLERGIES: Patient has  no known allergies.  Family History  Problem Relation Age of Onset   Healthy Mother        died age 75 (communist country could not get records)   Other Father        died young-unclear cause   Other Sister        died in 55 "old age. hard to get answers in [place]   Other Sister        64,  hard to get answers in [place]- possible cancer- rectal or colorectal?   Other Sister        died at 38- unclear cause    Social History   Socioeconomic History   Marital status: Widowed    Spouse name: Not on file   Number of children: 3   Years of education: Not on file   Highest education level: Not on file  Occupational History   Occupation: retired  Tobacco Use   Smoking status: Former    Packs/day: 0.20    Years: 20.00    Pack years: 4.00    Types: Cigarettes    Quit date: 09/21/1978    Years since quitting: 42.9   Smokeless tobacco: Never   Substance and Sexual Activity   Alcohol use: Not Currently    Alcohol/week: 7.0 standard drinks    Types: 7 Standard drinks or equivalent per week   Drug use: No   Sexual activity: Not Currently    Birth control/protection: None, Post-menopausal  Other Topics Concern   Not on file  Social History Narrative   Widowed April 2016. 3 children. 7 grandkids. 3 greatgrandkids.   Husband worked as Chief Executive Officer.       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      Hobbies: travel- husband was buried at home ([place])   Social Determinants of Radio broadcast assistant Strain: Low Risk    Difficulty of Paying Living Expenses: Not hard at all  Food Insecurity: No Food Insecurity   Worried About Charity fundraiser in the Last Year: Never true   Arboriculturist in the Last Year: Never true  Transportation Needs: No Transportation Needs   Lack of Transportation (Medical): No   Lack of Transportation (Non-Medical): No  Physical Activity: Insufficiently Active   Days of Exercise per Week: 1 day   Minutes of Exercise per Session: 20 min  Stress: No Stress Concern Present   Feeling of Stress : Not at all  Social Connections: Socially Isolated   Frequency of Communication with Friends and Family: Three times a week   Frequency of Social Gatherings with Friends and Family: Twice a week   Attends Religious Services: Never   Marine scientist or Organizations: No   Attends Archivist Meetings: Never   Marital Status: Widowed  Human resources officer Violence: Not At Risk   Fear of Current or Ex-Partner: No   Emotionally Abused: No   Physically Abused: No   Sexually Abused: No    Review of Systems  All other systems reviewed and are negative.  PHYSICAL EXAMINATION:    BP 110/80   Pulse 91   Ht 5' (1.524 m)   Wt 149 lb (67.6 kg)   SpO2 99%   BMI 29.10 kg/m     General appearance: alert, cooperative and appears stated age Neck: no  adenopathy, supple, symmetrical, trachea midline and thyroid normal to inspection and palpation Heart: regular rate and rhythm Lungs: CTAB  Abdomen: soft, non-tender; bowel sounds normal; no masses,  no organomegaly Extremities: normal, atraumatic, no cyanosis Skin: normal color, texture and turgor, no rashes or lesions Lymph: normal cervical supraclavicular and inguinal nodes Neurologic: grossly normal   Pelvic: External genitalia:  no lesions              Urethra:  normal appearing urethra with no masses, tenderness or lesions              Bartholins and Skenes: normal                 Vagina: atrophic appearing vagina with a grade 2-3 rectocele, no significant uterine prolapse or cystocele.               Cervix: no lesions              Bimanual Exam:  Uterus:   no masses or tenderness              Adnexa: no mass, fullness, tenderness               Chaperone was present for exam.  1. Rectocele Desires surgical repair. Discussed risks of surgery, including but not limited to: infection, bleeding, damage to nearby organs, blood clots, issues regarding her Afib. Discussed the risk of recurrent prolapse. Discussed the need to avoid heavy lifting and straining.  -Will pretreat with antibiotics and Lovenox  -Will check preoperative blood work. Normal renal function in 9/22.   2. Vaginal atrophy Continue vaginal estrogen  3. Permanent atrial fibrillation ([place]) She has had preop clearance with Cardiology. Will stop her Eliquis 2 days prior to surgery.   4. CLL (chronic lymphocytic leukemia) ([place]) Oncologist has cleared her for surgery  5. Essential hypertension, benign Controlled on medication.  CC: Dr Yong Channel, Dr Audie Box, Dr Alvy Bimler

## 2021-08-18 NOTE — H&P (View-Only) (Signed)
GYNECOLOGY  VISIT   HPI: 87 y.o.   Widowed White or Caucasian Not Hispanic or Latino  female   [phone] with No LMP recorded. Patient is postmenopausal.   here for pre op visit for rectocele repair.   She started on vaginal estrogen cream last month.  Normal urinary flow. She has a small amount of urge incontinence a few x a week. Voids normal amounts. No GSI. No issues with BM. Not sexually active, no vaginal bleeding.    She has a h/o CLL, well controlled, no treatment. Cleared for surgery by Oncology.   H/O Afib, and HTN. She is on Eliquis. She was seen and cleared by Cardiology on 08/12/21, he recommended she stop her Eloquis 2 days prior to surgery.   Recent TSH 0.691.  CBC from 06/17/21 WBC of 21.5, Hgb 14.2, plt 142  Last CMP on 05/30/21, creatinine 0.92, GFR >60  GYNECOLOGIC HISTORY: No LMP recorded. Patient is postmenopausal. Contraception:pmp  Menopausal hormone therapy: yes estrace  cream.         OB History     Gravida  3   Para  3   Term  3   Preterm      AB      Living  3      SAB      IAB      Ectopic      Multiple      Live Births  3              Patient Active Problem List   Diagnosis Date Noted   Permanent atrial fibrillation ([place]) 04/24/2021   Secondary hypercoagulable state ([place]) 12/10/2020   Persistent atrial fibrillation ([place]) 11/29/2020   Hyperlipidemia, unspecified 04/07/2018   CKD (chronic kidney disease), stage III ([place]) 02/09/2018   Rash 02/09/2018   Seborrheic dermatitis 01/08/2017   Former smoker 09/30/2015   Uterine prolapse 03/29/2015   Thrombocytopenia ([place]) 11/26/2014   CLL (chronic lymphocytic leukemia) ([place]) 11/27/2013   Hyperglycemia 05/08/2013   Essential hypertension, benign 11/07/2012   Obesity, unspecified 11/07/2012    Past Medical History:  Diagnosis Date   Arthritis    knee   CLL (chronic lymphocytic leukemia) ([place]) 11/27/2013   Hypertension    Ulcer    stomach ulcer when young   Urinary  incontinence     Past Surgical History:  Procedure Laterality Date   APPENDECTOMY     catarat Right 07/23/2019   catarat Left 08/31/2019   TONSILLECTOMY AND ADENOIDECTOMY      Current Outpatient Medications  Medication Sig Dispense Refill   amLODipine (NORVASC) 10 MG tablet TAKE 1 TABLET BY MOUTH EVERY DAY 90 tablet 3   atenolol (TENORMIN) 100 MG tablet TAKE 1 TABLET BY MOUTH EVERY DAY 90 tablet 3   ELIQUIS 5 MG TABS tablet TAKE 1 TABLET BY MOUTH TWICE A DAY 60 tablet 5   estradiol (ESTRACE) 0.1 MG/GM vaginal cream 1 gram vaginally every night for the 2 weeks prior to surgery 42.5 g 0   hydrochlorothiazide (HYDRODIURIL) 25 MG tablet TAKE 1 TABLET BY MOUTH EVERY DAY (Patient taking differently: 12.5 mg.) 90 tablet 3   losartan (COZAAR) 100 MG tablet TAKE 1 TABLET BY MOUTH EVERY DAY 90 tablet 3   naproxen sodium (ALEVE) 220 MG tablet Take 220 mg by mouth daily as needed (pain).     No current facility-administered medications for this visit.   PATIENT is taking 1/2 a hydrochlorothiazide tablet prn SBP >145   ALLERGIES: Patient has  no known allergies.  Family History  Problem Relation Age of Onset   Healthy Mother        died age 5 (communist country could not get records)   Other Father        died young-unclear cause   Other Sister        died in 35 "old age. hard to get answers in [place]   Other Sister        89,  hard to get answers in [place]- possible cancer- rectal or colorectal?   Other Sister        died at 80- unclear cause    Social History   Socioeconomic History   Marital status: Widowed    Spouse name: Not on file   Number of children: 3   Years of education: Not on file   Highest education level: Not on file  Occupational History   Occupation: retired  Tobacco Use   Smoking status: Former    Packs/day: 0.20    Years: 20.00    Pack years: 4.00    Types: Cigarettes    Quit date: 09/21/1978    Years since quitting: 42.9   Smokeless tobacco: Never   Substance and Sexual Activity   Alcohol use: Not Currently    Alcohol/week: 7.0 standard drinks    Types: 7 Standard drinks or equivalent per week   Drug use: No   Sexual activity: Not Currently    Birth control/protection: None, Post-menopausal  Other Topics Concern   Not on file  Social History Narrative   Widowed April 2016. 3 children. 7 grandkids. 3 greatgrandkids.   Husband worked as Chief Executive Officer.       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      Hobbies: travel- husband was buried at home ([place])   Social Determinants of Radio broadcast assistant Strain: Low Risk    Difficulty of Paying Living Expenses: Not hard at all  Food Insecurity: No Food Insecurity   Worried About Charity fundraiser in the Last Year: Never true   Arboriculturist in the Last Year: Never true  Transportation Needs: No Transportation Needs   Lack of Transportation (Medical): No   Lack of Transportation (Non-Medical): No  Physical Activity: Insufficiently Active   Days of Exercise per Week: 1 day   Minutes of Exercise per Session: 20 min  Stress: No Stress Concern Present   Feeling of Stress : Not at all  Social Connections: Socially Isolated   Frequency of Communication with Friends and Family: Three times a week   Frequency of Social Gatherings with Friends and Family: Twice a week   Attends Religious Services: Never   Marine scientist or Organizations: No   Attends Archivist Meetings: Never   Marital Status: Widowed  Human resources officer Violence: Not At Risk   Fear of Current or Ex-Partner: No   Emotionally Abused: No   Physically Abused: No   Sexually Abused: No    Review of Systems  All other systems reviewed and are negative.  PHYSICAL EXAMINATION:    BP 110/80   Pulse 91   Ht 5' (1.524 m)   Wt 149 lb (67.6 kg)   SpO2 99%   BMI 29.10 kg/m     General appearance: alert, cooperative and appears stated age Neck: no  adenopathy, supple, symmetrical, trachea midline and thyroid normal to inspection and palpation Heart: regular rate and rhythm Lungs: CTAB  Abdomen: soft, non-tender; bowel sounds normal; no masses,  no organomegaly Extremities: normal, atraumatic, no cyanosis Skin: normal color, texture and turgor, no rashes or lesions Lymph: normal cervical supraclavicular and inguinal nodes Neurologic: grossly normal   Pelvic: External genitalia:  no lesions              Urethra:  normal appearing urethra with no masses, tenderness or lesions              Bartholins and Skenes: normal                 Vagina: atrophic appearing vagina with a grade 2-3 rectocele, no significant uterine prolapse or cystocele.               Cervix: no lesions              Bimanual Exam:  Uterus:   no masses or tenderness              Adnexa: no mass, fullness, tenderness               Chaperone was present for exam.  1. Rectocele Desires surgical repair. Discussed risks of surgery, including but not limited to: infection, bleeding, damage to nearby organs, blood clots, issues regarding her Afib. Discussed the risk of recurrent prolapse. Discussed the need to avoid heavy lifting and straining.  -Will pretreat with antibiotics and Lovenox  -Will check preoperative blood work. Normal renal function in 9/22.   2. Vaginal atrophy Continue vaginal estrogen  3. Permanent atrial fibrillation ([place]) She has had preop clearance with Cardiology. Will stop her Eliquis 2 days prior to surgery.   4. CLL (chronic lymphocytic leukemia) ([place]) Oncologist has cleared her for surgery  5. Essential hypertension, benign Controlled on medication.  CC: Dr Yong Channel, Dr Audie Box, Dr Alvy Bimler

## 2021-08-18 NOTE — Patient Instructions (Signed)

## 2021-08-19 ENCOUNTER — Encounter (HOSPITAL_BASED_OUTPATIENT_CLINIC_OR_DEPARTMENT_OTHER): Payer: Self-pay | Admitting: Obstetrics and Gynecology

## 2021-08-19 ENCOUNTER — Other Ambulatory Visit: Payer: Self-pay

## 2021-08-19 NOTE — Progress Notes (Signed)
Spoke w/ via phone for pre-op interview--- pt Lab needs dos----  cbc, cmp, t&s              Lab results------ current ekg in epic / chart COVID test -----patient states asymptomatic no test needed Arrive at ------- 0900 on 08-25-2021 NPO after MN NO Solid Food.  Clear liquids from MN until--- 0800 Med rec completed Medications to take morning of surgery ----- norvasc, atenolol Diabetic medication ----- n/a Patient instructed no nail polish to be worn day of surgery Patient instructed to bring photo id and insurance card day of surgery Patient aware to have Driver (ride ) / caregiver  for 24 hours after surgery --daughter, judy barthold Patient Special Instructions ----- n/a  Pre-Op special Istructions ----- pt has cardiac office note clearance by dr Wandra Mannan on 08-12-2021 in epic/ chart.  Also, has pcp, dr Annie Main hunter, telephone clearance dated 06-27-2021 in epic/ chart.  Patient verbalized understanding of instructions that were given at this phone interview. Patient denies shortness of breath, chest pain, fever, cough at this phone interview.   Anesthesia Review: HTN;  Perm AFib (dx 03/ 2022);  CLL, active survillence ;  CKD 3 Pt denies cardiac s&s, sob, and no peripheral swelling.  PCP: Dr Chauncey Cruel. Leonides Schanz 06-13-2021 epic) Cardiologist : Dr Viona Gilmore. O'Neal (lov 08-12-2021 epic) Oncologist:  Dr Alvy Bimler [facility] [phone] epic) Chest x-ray : 05-30-2021 epic EKG : 07-12-2021 epic Echo : 01-23-2021 epic Stress test: no Cardiac Cath : no Activity level: denies sob w/ any activity Sleep Study/ CPAP :no Blood Thinner/ Instructions Maryjane Hurter Dose: Eliquis ASA / Instructions/ Last Dose :  no Pt stated was given instructions from cardiology to stop eliquis 2 days prior to surgery, last dose will be 08-22-2021.

## 2021-08-19 NOTE — Telephone Encounter (Signed)
Spoke with patient. Surgery date request confirmed.  Advised surgery is scheduled for 08/25/21, [facility] at 1100.  Surgery instruction sheet and hospital brochure reviewed, printed copy will be mailed.  Patient advised if Covid screening and quarantine requirements and agreeable.   Patient advised to place Fleets enema morning of surgery. Reviewed Eliquis recommendations per cardiology.   Routing to provider. Encounter closed.  Cc: Hayley Carder

## 2021-08-25 ENCOUNTER — Other Ambulatory Visit: Payer: Self-pay

## 2021-08-25 ENCOUNTER — Encounter (HOSPITAL_BASED_OUTPATIENT_CLINIC_OR_DEPARTMENT_OTHER): Payer: Self-pay | Admitting: Obstetrics and Gynecology

## 2021-08-25 ENCOUNTER — Encounter (HOSPITAL_BASED_OUTPATIENT_CLINIC_OR_DEPARTMENT_OTHER)
Admission: RE | Disposition: A | Discharge status: Home / Self Care | Payer: Self-pay | Source: Home / Self Care | Attending: Obstetrics and Gynecology

## 2021-08-25 ENCOUNTER — Ambulatory Visit (HOSPITAL_BASED_OUTPATIENT_CLINIC_OR_DEPARTMENT_OTHER): Payer: PPO | Admitting: Anesthesiology

## 2021-08-25 ENCOUNTER — Ambulatory Visit (HOSPITAL_BASED_OUTPATIENT_CLINIC_OR_DEPARTMENT_OTHER)
Admission: RE | Admit: 2021-08-25 | Discharge: 2021-08-25 | Disposition: A | Discharge status: Home / Self Care | Payer: PPO | Attending: Obstetrics and Gynecology | Admitting: Obstetrics and Gynecology

## 2021-08-25 DIAGNOSIS — N815 Vaginal enterocele: Secondary | ICD-10-CM

## 2021-08-25 DIAGNOSIS — N816 Rectocele: Secondary | ICD-10-CM | POA: Insufficient documentation

## 2021-08-25 DIAGNOSIS — I10 Essential (primary) hypertension: Secondary | ICD-10-CM | POA: Diagnosis not present

## 2021-08-25 DIAGNOSIS — I4891 Unspecified atrial fibrillation: Secondary | ICD-10-CM | POA: Diagnosis not present

## 2021-08-25 DIAGNOSIS — Z87891 Personal history of nicotine dependence: Secondary | ICD-10-CM | POA: Insufficient documentation

## 2021-08-25 DIAGNOSIS — Z9889 Other specified postprocedural states: Secondary | ICD-10-CM | POA: Diagnosis present

## 2021-08-25 DIAGNOSIS — I129 Hypertensive chronic kidney disease with stage 1 through stage 4 chronic kidney disease, or unspecified chronic kidney disease: Secondary | ICD-10-CM | POA: Diagnosis not present

## 2021-08-25 DIAGNOSIS — Z79899 Other long term (current) drug therapy: Secondary | ICD-10-CM | POA: Diagnosis not present

## 2021-08-25 DIAGNOSIS — N183 Chronic kidney disease, stage 3 unspecified: Secondary | ICD-10-CM | POA: Diagnosis not present

## 2021-08-25 DIAGNOSIS — Z01818 Encounter for other preprocedural examination: Secondary | ICD-10-CM

## 2021-08-25 HISTORY — DX: Presence of dental prosthetic device (complete) (partial): Z97.2

## 2021-08-25 HISTORY — DX: Long term (current) use of anticoagulants: Z79.01

## 2021-08-25 HISTORY — DX: Chronic kidney disease, stage 3 unspecified: N18.30

## 2021-08-25 HISTORY — DX: Essential (primary) hypertension: I10

## 2021-08-25 HISTORY — DX: Personal history of peptic ulcer disease: Z87.11

## 2021-08-25 HISTORY — DX: Postmenopausal atrophic vaginitis: N95.2

## 2021-08-25 HISTORY — DX: Rectocele: N81.6

## 2021-08-25 HISTORY — DX: Complete loss of teeth, unspecified cause, unspecified class: K08.109

## 2021-08-25 HISTORY — PX: RECTOCELE REPAIR: SHX761

## 2021-08-25 LAB — COMPREHENSIVE METABOLIC PANEL
ALT: 21 U/L (ref 0–44)
AST: 25 U/L (ref 15–41)
Albumin: 4.5 g/dL (ref 3.5–5.0)
Alkaline Phosphatase: 58 U/L (ref 38–126)
Anion gap: 6 (ref 5–15)
BUN: 25 mg/dL — ABNORMAL HIGH (ref 8–23)
CO2: 25 mmol/L (ref 22–32)
Calcium: 9.3 mg/dL (ref 8.9–10.3)
Chloride: 107 mmol/L (ref 98–111)
Creatinine, Ser: 0.96 mg/dL (ref 0.44–1.00)
GFR, Estimated: 57 mL/min — ABNORMAL LOW (ref >60)
Glucose, Bld: 136 mg/dL — ABNORMAL HIGH (ref 70–99)
Potassium: 4.2 mmol/L (ref 3.5–5.1)
Sodium: 138 mmol/L (ref 135–145)
Total Bilirubin: 1.2 mg/dL (ref 0.3–1.2)
Total Protein: 6.5 g/dL (ref 6.5–8.1)

## 2021-08-25 LAB — CBC
HCT: 45.6 % (ref 36.0–46.0)
Hemoglobin: 14.9 g/dL (ref 12.0–15.0)
MCH: 29.1 pg (ref 26.0–34.0)
MCHC: 32.7 g/dL (ref 30.0–36.0)
MCV: 89.1 fL (ref 80.0–100.0)
Platelets: 108 10*3/uL — ABNORMAL LOW (ref 150–400)
RBC: 5.12 MIL/uL — ABNORMAL HIGH (ref 3.87–5.11)
RDW: 15.4 % (ref 11.5–15.5)
WBC: 20.9 10*3/uL — ABNORMAL HIGH (ref 4.0–10.5)
nRBC: 0 % (ref 0.0–0.2)

## 2021-08-25 LAB — TYPE AND SCREEN
ABO/RH(D): A NEG
Antibody Screen: NEGATIVE

## 2021-08-25 LAB — ABO/RH: ABO/RH(D): A NEG

## 2021-08-25 SURGERY — COLPORRHAPHY, POSTERIOR, FOR RECTOCELE REPAIR
Anesthesia: General | Site: Vagina

## 2021-08-25 MED ORDER — LACTATED RINGERS IV SOLN: INTRAVENOUS | Status: DC

## 2021-08-25 MED ORDER — PROPOFOL 500 MG/50ML IV EMUL
INTRAVENOUS | Status: AC
  Filled 2021-08-25: qty 50

## 2021-08-25 MED ORDER — MORPHINE SULFATE (PF) 4 MG/ML IV SOLN: 1.0000 mg | INTRAVENOUS | Status: DC | PRN

## 2021-08-25 MED ORDER — OXYCODONE HCL 5 MG/5ML PO SOLN: 5.0000 mg | Freq: Once | ORAL | Status: DC | PRN

## 2021-08-25 MED ORDER — ENOXAPARIN SODIUM 40 MG/0.4ML IJ SOSY: 40.0000 mg | PREFILLED_SYRINGE | INTRAMUSCULAR | Status: DC

## 2021-08-25 MED ORDER — KCL IN DEXTROSE-NACL 20-5-0.45 MEQ/L-%-% IV SOLN
INTRAVENOUS | Status: DC
  Filled 2021-08-25: qty 1000

## 2021-08-25 MED ORDER — 0.9 % SODIUM CHLORIDE (POUR BTL) OPTIME
TOPICAL | Status: DC | PRN
  Administered 2021-08-25: 500 mL

## 2021-08-25 MED ORDER — ONDANSETRON HCL 4 MG/2ML IJ SOLN
INTRAMUSCULAR | Status: AC
  Filled 2021-08-25: qty 2

## 2021-08-25 MED ORDER — ONDANSETRON HCL 4 MG/2ML IJ SOLN: 4.0000 mg | Freq: Once | INTRAMUSCULAR | Status: DC | PRN

## 2021-08-25 MED ORDER — LIDOCAINE-EPINEPHRINE 1 %-1:100000 IJ SOLN
INTRAMUSCULAR | Status: DC | PRN
  Administered 2021-08-25: 10 mL

## 2021-08-25 MED ORDER — IBUPROFEN 200 MG PO TABS
ORAL_TABLET | ORAL | Status: AC
  Filled 2021-08-25: qty 3

## 2021-08-25 MED ORDER — ONDANSETRON HCL 4 MG/2ML IJ SOLN
INTRAMUSCULAR | Status: DC | PRN
  Administered 2021-08-25: 4 mg via INTRAVENOUS

## 2021-08-25 MED ORDER — ACETAMINOPHEN 325 MG PO TABS: 325.0000 mg | ORAL_TABLET | ORAL | Status: DC | PRN

## 2021-08-25 MED ORDER — PROPOFOL 10 MG/ML IV BOLUS
INTRAVENOUS | Status: DC | PRN
  Administered 2021-08-25: 80 mg via INTRAVENOUS
  Administered 2021-08-25: 30 mg via INTRAVENOUS

## 2021-08-25 MED ORDER — OXYCODONE HCL 5 MG PO TABS: 5.0000 mg | ORAL_TABLET | Freq: Once | ORAL | Status: DC | PRN

## 2021-08-25 MED ORDER — PROPOFOL 10 MG/ML IV BOLUS
INTRAVENOUS | Status: AC
  Filled 2021-08-25: qty 20

## 2021-08-25 MED ORDER — MEPERIDINE HCL 25 MG/ML IJ SOLN: 6.2500 mg | INTRAMUSCULAR | Status: DC | PRN

## 2021-08-25 MED ORDER — ONDANSETRON HCL 4 MG PO TABS: 4.0000 mg | ORAL_TABLET | Freq: Four times a day (QID) | ORAL | Status: DC | PRN

## 2021-08-25 MED ORDER — DEXAMETHASONE SODIUM PHOSPHATE 10 MG/ML IJ SOLN
INTRAMUSCULAR | Status: DC | PRN
  Administered 2021-08-25: 10 mg via INTRAVENOUS

## 2021-08-25 MED ORDER — DOCUSATE SODIUM 100 MG PO CAPS
ORAL_CAPSULE | ORAL | Status: AC
  Filled 2021-08-25: qty 1

## 2021-08-25 MED ORDER — LIDOCAINE 2% (20 MG/ML) 5 ML SYRINGE
INTRAMUSCULAR | Status: DC | PRN
  Administered 2021-08-25: 60 mg via INTRAVENOUS

## 2021-08-25 MED ORDER — DOCUSATE SODIUM 100 MG PO CAPS
100.0000 mg | ORAL_CAPSULE | Freq: Two times a day (BID) | ORAL | Status: DC
  Administered 2021-08-25: 100 mg via ORAL

## 2021-08-25 MED ORDER — ONDANSETRON HCL 4 MG/2ML IJ SOLN: 4.0000 mg | Freq: Four times a day (QID) | INTRAMUSCULAR | Status: DC | PRN

## 2021-08-25 MED ORDER — ACETAMINOPHEN 500 MG PO TABS
1000.0000 mg | ORAL_TABLET | ORAL | Status: AC
  Administered 2021-08-25: 1000 mg via ORAL

## 2021-08-25 MED ORDER — ALUM & MAG HYDROXIDE-SIMETH 200-200-20 MG/5ML PO SUSP: 30.0000 mL | ORAL | Status: DC | PRN

## 2021-08-25 MED ORDER — ACETAMINOPHEN 500 MG PO TABS
ORAL_TABLET | ORAL | Status: AC
  Filled 2021-08-25: qty 2

## 2021-08-25 MED ORDER — DEXAMETHASONE SODIUM PHOSPHATE 10 MG/ML IJ SOLN
INTRAMUSCULAR | Status: AC
  Filled 2021-08-25: qty 1

## 2021-08-25 MED ORDER — IBUPROFEN 200 MG PO TABS
600.0000 mg | ORAL_TABLET | Freq: Four times a day (QID) | ORAL | Status: DC
  Administered 2021-08-25: 600 mg via ORAL

## 2021-08-25 MED ORDER — ACETAMINOPHEN 500 MG PO TABS
1000.0000 mg | ORAL_TABLET | Freq: Four times a day (QID) | ORAL | Status: DC
  Administered 2021-08-25: 1000 mg via ORAL

## 2021-08-25 MED ORDER — ESTRADIOL 0.1 MG/GM VA CREA
TOPICAL_CREAM | VAGINAL | Status: DC | PRN
  Administered 2021-08-25: 1 via VAGINAL

## 2021-08-25 MED ORDER — ENOXAPARIN SODIUM 40 MG/0.4ML IJ SOSY
40.0000 mg | PREFILLED_SYRINGE | INTRAMUSCULAR | Status: AC
  Administered 2021-08-25: 40 mg via SUBCUTANEOUS

## 2021-08-25 MED ORDER — HEMOSTATIC AGENTS (NO CHARGE) OPTIME
TOPICAL | Status: DC | PRN
  Administered 2021-08-25: 1 via TOPICAL

## 2021-08-25 MED ORDER — FENTANYL CITRATE (PF) 100 MCG/2ML IJ SOLN: 25.0000 ug | INTRAMUSCULAR | Status: DC | PRN

## 2021-08-25 MED ORDER — ENOXAPARIN SODIUM 40 MG/0.4ML IJ SOSY
PREFILLED_SYRINGE | INTRAMUSCULAR | Status: AC
  Filled 2021-08-25: qty 0.4

## 2021-08-25 MED ORDER — LIDOCAINE 2% (20 MG/ML) 5 ML SYRINGE
INTRAMUSCULAR | Status: AC
  Filled 2021-08-25: qty 5

## 2021-08-25 MED ORDER — AMLODIPINE BESYLATE 10 MG PO TABS: 10.0000 mg | ORAL_TABLET | Freq: Every day | ORAL | Status: DC

## 2021-08-25 MED ORDER — LOSARTAN POTASSIUM 50 MG PO TABS: 100.0000 mg | ORAL_TABLET | Freq: Every day | ORAL | Status: DC

## 2021-08-25 MED ORDER — ACETAMINOPHEN 160 MG/5ML PO SOLN: 325.0000 mg | ORAL | Status: DC | PRN

## 2021-08-25 MED ORDER — FENTANYL CITRATE (PF) 100 MCG/2ML IJ SOLN
INTRAMUSCULAR | Status: DC | PRN
  Administered 2021-08-25 (×4): 25 ug via INTRAVENOUS

## 2021-08-25 MED ORDER — SODIUM CHLORIDE 0.9 % IV SOLN
INTRAVENOUS | Status: AC
  Filled 2021-08-25: qty 2

## 2021-08-25 MED ORDER — ATENOLOL 100 MG PO TABS: 100.0000 mg | ORAL_TABLET | Freq: Every day | ORAL | Status: DC

## 2021-08-25 MED ORDER — SODIUM CHLORIDE 0.9 % IV SOLN
2.0000 g | INTRAVENOUS | Status: AC
  Administered 2021-08-25: 2 g via INTRAVENOUS

## 2021-08-25 MED ORDER — MENTHOL 3 MG MT LOZG: 1.0000 | LOZENGE | OROMUCOSAL | Status: DC | PRN

## 2021-08-25 MED ORDER — SODIUM CHLORIDE 0.9 % IV SOLN: INTRAVENOUS | Status: DC

## 2021-08-25 MED ORDER — FENTANYL CITRATE (PF) 100 MCG/2ML IJ SOLN
INTRAMUSCULAR | Status: AC
  Filled 2021-08-25: qty 2

## 2021-08-25 MED ORDER — OXYCODONE HCL 5 MG PO TABS: 5.0000 mg | ORAL_TABLET | ORAL | Status: DC | PRN

## 2021-08-25 SURGICAL SUPPLY — 37 items
AGENT HMST KT MTR STRL THRMB (HEMOSTASIS)
BLADE SURG 15 STRL LF DISP TIS (BLADE) ×1 IMPLANT
BLADE SURG 15 STRL SS (BLADE) ×2
CLOTH BEACON ORANGE TIMEOUT ST (SAFETY) ×2 IMPLANT
CNTNR URN SCR LID CUP LEK RST (MISCELLANEOUS) IMPLANT
CONT SPEC 4OZ STRL OR WHT (MISCELLANEOUS)
DECANTER SPIKE VIAL GLASS SM (MISCELLANEOUS) IMPLANT
DRAPE SHEET LG 3/4 BI-LAMINATE (DRAPES) ×2 IMPLANT
DRAPE UTILITY XL STRL (DRAPES) ×2 IMPLANT
GAUZE 4X4 16PLY ~~LOC~~+RFID DBL (SPONGE) ×2 IMPLANT
GAUZE PACKING 2X5 YD STRL (GAUZE/BANDAGES/DRESSINGS) ×2 IMPLANT
GLOVE SURG ENC MOIS LTX SZ6.5 (GLOVE) ×4 IMPLANT
GLOVE SURG LTX SZ6.5 (GLOVE) ×2 IMPLANT
GLOVE SURG UNDER POLY LF SZ7 (GLOVE) ×4 IMPLANT
GOWN STRL REUS W/TWL LRG LVL3 (GOWN DISPOSABLE) ×8 IMPLANT
KIT TURNOVER CYSTO (KITS) ×2 IMPLANT
NEEDLE HYPO 22GX1.5 SAFETY (NEEDLE) ×2 IMPLANT
NS IRRIG 1000ML POUR BTL (IV SOLUTION) ×2 IMPLANT
NS IRRIG 500ML POUR BTL (IV SOLUTION) ×2 IMPLANT
PACK VAGINAL WOMENS (CUSTOM PROCEDURE TRAY) ×2 IMPLANT
PENCIL SMOKE EVACUATOR (MISCELLANEOUS) ×2 IMPLANT
SET IRRIG Y TYPE TUR BLADDER L (SET/KITS/TRAYS/PACK) IMPLANT
SPONGE SURGIFOAM ABS GEL 100 (HEMOSTASIS) IMPLANT
SPONGE SURGIFOAM ABS GEL 12-7 (HEMOSTASIS) ×2 IMPLANT
SURGIFLO W/THROMBIN 8M KIT (HEMOSTASIS) IMPLANT
SUT MNCRL AB 4-0 PS2 18 (SUTURE) IMPLANT
SUT PDS AB 0 CT 36 (SUTURE) IMPLANT
SUT PDS AB 0 CT1 36 (SUTURE) IMPLANT
SUT PDS AB 3-0 SH 27 (SUTURE) IMPLANT
SUT VIC AB 0 CT1 18XCR BRD8 (SUTURE) ×1 IMPLANT
SUT VIC AB 0 CT1 8-18 (SUTURE) ×2
SUT VIC AB 2-0 CT2 27 (SUTURE) IMPLANT
SUT VIC AB 2-0 SH 27 (SUTURE) ×8
SUT VIC AB 2-0 SH 27XBRD (SUTURE) ×4 IMPLANT
SYR BULB IRRIG 60ML STRL (SYRINGE) ×2 IMPLANT
TOWEL OR 17X26 10 PK STRL BLUE (TOWEL DISPOSABLE) ×4 IMPLANT
TRAY FOLEY W/BAG SLVR 14FR LF (SET/KITS/TRAYS/PACK) ×2 IMPLANT

## 2021-08-25 NOTE — Anesthesia Procedure Notes (Signed)
Procedure Name: LMA Insertion Date/Time: 08/25/2021 10:17 AM Performed by: Genelle Bal, CRNA Pre-anesthesia Checklist: Patient identified, Emergency Drugs available, Suction available and Patient being monitored Patient Re-evaluated:Patient Re-evaluated prior to induction Oxygen Delivery Method: Circle system utilized Preoxygenation: Pre-oxygenation with 100% oxygen Induction Type: IV induction Ventilation: Mask ventilation without difficulty LMA: LMA with gastric port inserted LMA Size: 4.0 Number of attempts: 1 Airway Equipment and Method: Bite block Placement Confirmation: positive ETCO2 Tube secured with: Tape Dental Injury: Teeth and Oropharynx as per pre-operative assessment

## 2021-08-25 NOTE — Anesthesia Postprocedure Evaluation (Signed)
Anesthesia Post Note  Patient: Maria Lowe  Procedure(s) Performed: POSTERIOR REPAIR (RECTOCELE) (Vagina )     Patient location during evaluation: PACU Anesthesia Type: General Level of consciousness: awake and alert Pain management: pain level controlled Vital Signs Assessment: post-procedure vital signs reviewed and stable Respiratory status: spontaneous breathing, nonlabored ventilation, respiratory function stable and patient connected to nasal cannula oxygen Cardiovascular status: blood pressure returned to baseline and stable Postop Assessment: no apparent nausea or vomiting Anesthetic complications: no   No notable events documented.  Last Vitals:  Vitals:   08/25/21 1257 08/25/21 1355  BP: 131/61 124/89  Pulse: (!) 58 65  Resp: 15 14  Temp: 36.7 C 36.6 C  SpO2: 95% 96%    Last Pain:  Vitals:   08/25/21 1451  TempSrc:   PainSc: 0-No pain                 Numa Schroeter

## 2021-08-25 NOTE — Op Note (Signed)
Preoperative Diagnosis: Rectocele  Postoperative Diagnosis: Rectocele, enterocele  Procedure: rectocele repair, enterocele repair, perineorrhaphy   Surgeon: Dr Sumner Boast  Assistants: Dr Josefa Half, an MD assistant was necessary for tissue manipulation, retraction and positioning due to the complexity of the case and hospital policies   Anesthesia: General via LMA  EBL: 50 cc  Fluids: 900 cc LR  Urine output: 75 cc  Indications for surgery: The patient is a 87 yo female, who presented with a symptomatic rectocele and desired repair. Preoperative medical clearance was obtained.  The risks of the surgery were reviewed with the patient and the consent form was signed prior to her surgery.  Findings: grade 2-3 rectocele, grade 2 enterocele  Specimens: none  Procedure: The patient was taken to the operating room with an IV in place. Anesthesia was administered and she was placed in the dorsal lithotomy position. She was prepped and draped in the usual sterile fashion for a vaginal procedure, she was in and out catheterized.   Attention was turned to the rectocele repair. 2 alices were placed on the posterior fourchette, just lateral to midline on both sides. The perineum was injected with lidocaine with epinephrine. A small triangular piece of skin was removed from the perineum. An alice clamp was placed on the superior portion of the rectocele and the vaginal mucosa was injected with lidocaine with epinephrine. The vaginal mucosa was dissected off of the rectum and incised with the midline. The large enterocele was entered and opened. The peritoneum was then closed with a purse string suture of 2-0 Vicryl reducing the enterocele. The rectocele was reduced and was closed with multiple interrupted sutures of 2-0 Vicryl. Some of the excess vaginal mucosa was trimmed. A small amount of gelfoam was placed over the rectum and under the vaginal mucosa. The vaginal mucosa and perineum were closed  with a running stitch of 2-0 Vicryl in the usual fashion, incorporating the bulbocavernosus muscles into the repair. Hemostasis was excellent. A couple of grams of estrace cream were placed in the vagina at the end of the procedure. Upon awakening, the LMA was removed, and the patient was transferred to recovery room in good condition.    CC: Dr Yong Channel, Dr Alvy Bimler, Malka So, PA

## 2021-08-25 NOTE — Transfer of Care (Signed)
Immediate Anesthesia Transfer of Care Note  Patient: Maria Lowe  Procedure(s) Performed: POSTERIOR REPAIR (RECTOCELE) (Vagina )  Patient Location: PACU  Anesthesia Type:General  Level of Consciousness: awake, alert  and oriented  Airway & Oxygen Therapy: Patient Spontanous Breathing and Patient connected to face mask oxygen  Post-op Assessment: Report given to RN  Post vital signs: Reviewed and stable  Last Vitals:  Vitals Value Taken Time  BP 114/63   Temp    Pulse 74 08/25/21 1139  Resp 25 08/25/21 1139  SpO2 97 % 08/25/21 1139  Vitals shown include unvalidated device data.  Last Pain:  Vitals:   08/25/21 0922  TempSrc: Oral  PainSc: 0-No pain      Patients Stated Pain Goal: 5 (36/46/80 3212)  Complications: No notable events documented.

## 2021-08-25 NOTE — Discharge Summary (Signed)
Physician Discharge Summary  Patient ID: Maria Lowe MRN: [id number] DOB/AGE: April 13, 1931 87 y.o.  Admit date: 08/25/2021 Discharge date: 08/25/2021  Admission Diagnoses: rectocele  Discharge Diagnoses:  Principal Problem:   H/O rectocele repair H/o enterocele repair  Discharged Condition: good  Hospital Course: uncomplicated  Consults: None  Significant Diagnostic Studies: none  Treatments: surgery: rectocele repair, enterocele repair, perineorrhaphy   Discharge Exam: Blood pressure 131/61, pulse 68, temperature 97.9 F (36.6 C), resp. rate 14, height 5' (1.524 m), weight 65.9 kg, SpO2 98 %. General appearance: alert, cooperative, and no distress Resp: clear to auscultation bilaterally Cardio: S1, S2 normal GI: soft, non-tender; bowel sounds normal; no masses,  no organomegaly Extremities: extremities normal, atraumatic, no cyanosis or edema  Disposition: Discharge disposition: 01-Home or Self Care       Discharge Instructions     Call MD for:   Complete by: As directed    Call for heavy vaginal bleeding   Call MD for:  difficulty breathing, headache or visual disturbances   Complete by: As directed    Call MD for:  extreme fatigue   Complete by: As directed    Call MD for:  hives   Complete by: As directed    Call MD for:  persistant dizziness or light-headedness   Complete by: As directed    Call MD for:  persistant nausea and vomiting   Complete by: As directed    Call MD for:  redness, tenderness, or signs of infection (pain, swelling, redness, odor or green/yellow discharge around incision site)   Complete by: As directed    Call MD for:  severe uncontrolled pain   Complete by: As directed    Call MD for:  temperature >100.4   Complete by: As directed    Diet - low sodium heart healthy   Complete by: As directed    Increase activity slowly   Complete by: As directed    Lifting restrictions   Complete by: As directed    No heavy lifting,  nothing more than 10 lbs   No wound care   Complete by: As directed    Sexual Activity Restrictions   Complete by: As directed    No intercourse      Allergies as of 08/25/2021   No Known Allergies      Medication List     STOP taking these medications    naproxen sodium 220 MG tablet Commonly known as: ALEVE       TAKE these medications    amLODipine 10 MG tablet Commonly known as: NORVASC TAKE 1 TABLET BY MOUTH EVERY DAY   atenolol 100 MG tablet Commonly known as: TENORMIN TAKE 1 TABLET BY MOUTH EVERY DAY   Eliquis 5 MG Tabs tablet Generic drug: apixaban TAKE 1 TABLET BY MOUTH TWICE A DAY What changed: how much to take   estradiol 0.1 MG/GM vaginal cream Commonly known as: ESTRACE 1 gram vaginally every night for the 2 weeks prior to surgery What changed: when to take this   hydrochlorothiazide 25 MG tablet Commonly known as: HYDRODIURIL TAKE 1 TABLET BY MOUTH EVERY DAY What changed: how much to take   losartan 100 MG tablet Commonly known as: COZAAR TAKE 1 TABLET BY MOUTH EVERY DAY   ONE-A-DAY WOMENS 50+ PO Take by mouth daily.         Signed: Salvadore Dom 08/25/2021, 5:35 PM

## 2021-08-25 NOTE — Interval H&P Note (Signed)
History and Physical Interval Note:  08/25/2021 10:06 AM  Maria Lowe  has presented today for surgery, with the diagnosis of rectocele.  The various methods of treatment have been discussed with the patient and family. After consideration of risks, benefits and other options for treatment, the patient has consented to  Procedure(s): POSTERIOR REPAIR (RECTOCELE) (N/A) as a surgical intervention.  The patient's history has been reviewed, patient examined, no change in status, stable for surgery.  I have reviewed the patient's chart and labs.  Questions were answered to the patient's satisfaction.     Salvadore Dom

## 2021-08-25 NOTE — Anesthesia Preprocedure Evaluation (Addendum)
Anesthesia Evaluation  Patient identified by MRN, date of birth, ID band Patient awake    Reviewed: Allergy & Precautions, H&P , NPO status , Patient's Chart, lab work & pertinent test results, reviewed documented beta blocker date and time   Airway Mallampati: II  TM Distance: >3 FB Neck ROM: full    Dental no notable dental hx. (+) Edentulous Upper, Edentulous Lower   Pulmonary neg pulmonary ROS, former smoker,    Pulmonary exam normal breath sounds clear to auscultation       Cardiovascular Exercise Tolerance: Good hypertension, Pt. on medications and Pt. on home beta blockers + dysrhythmias Atrial Fibrillation  Rhythm:regular Rate:Normal     Neuro/Psych negative neurological ROS  negative psych ROS   GI/Hepatic Neg liver ROS, PUD,   Endo/Other  negative endocrine ROS  Renal/GU Renal disease  negative genitourinary   Musculoskeletal   Abdominal   Peds  Hematology negative hematology ROS (+)   Anesthesia Other Findings   Reproductive/Obstetrics negative OB ROS                            Anesthesia Physical Anesthesia Plan  ASA: 3  Anesthesia Plan: General   Post-op Pain Management: Ofirmev IV (intra-op)   Induction: Intravenous  PONV Risk Score and Plan: 3 and Ondansetron, Dexamethasone and Treatment may vary due to age or medical condition  Airway Management Planned: LMA and Oral ETT  Additional Equipment: None  Intra-op Plan:   Post-operative Plan: Extubation in OR  Informed Consent: I have reviewed the patients History and Physical, chart, labs and discussed the procedure including the risks, benefits and alternatives for the proposed anesthesia with the patient or authorized representative who has indicated his/her understanding and acceptance.     Dental Advisory Given  Plan Discussed with: CRNA and Anesthesiologist  Anesthesia Plan Comments: ( )         Anesthesia Quick Evaluation

## 2021-08-26 ENCOUNTER — Encounter (HOSPITAL_BASED_OUTPATIENT_CLINIC_OR_DEPARTMENT_OTHER): Payer: Self-pay | Admitting: Obstetrics and Gynecology

## 2021-09-01 ENCOUNTER — Ambulatory Visit (INDEPENDENT_AMBULATORY_CARE_PROVIDER_SITE_OTHER): Payer: PPO | Admitting: Obstetrics and Gynecology

## 2021-09-01 ENCOUNTER — Encounter: Payer: Self-pay | Admitting: Obstetrics and Gynecology

## 2021-09-01 ENCOUNTER — Other Ambulatory Visit: Payer: Self-pay

## 2021-09-01 VITALS — BP 122/74 | HR 63 | Wt 150.0 lb

## 2021-09-01 DIAGNOSIS — Z9889 Other specified postprocedural states: Secondary | ICD-10-CM

## 2021-09-01 NOTE — Progress Notes (Signed)
GYNECOLOGY  VISIT   HPI: 87 y.o.   Widowed White or Caucasian Not Hispanic or Latino  female   [phone] with No LMP recorded. Patient is postmenopausal.   here for one week post op s/p rectocele and enterocele repair.   Tiny amount of spotting. Normal BM. She is having some hesitancy to void, feels like she is emptying her bladder. Voiding normal amounts.  No pain.  GYNECOLOGIC HISTORY: No LMP recorded. Patient is postmenopausal. Contraception:PMP  Menopausal hormone therapy: estrace         OB History     Gravida  3   Para  3   Term  3   Preterm      AB      Living  3      SAB      IAB      Ectopic      Multiple      Live Births  3              Patient Active Problem List   Diagnosis Date Noted   H/O rectocele repair 08/25/2021   Permanent atrial fibrillation ([place]) 04/24/2021   Secondary hypercoagulable state ([place]) 12/10/2020   Persistent atrial fibrillation ([place]) 11/29/2020   Hyperlipidemia, unspecified 04/07/2018   CKD (chronic kidney disease), stage III ([place]) 02/09/2018   Rash 02/09/2018   Seborrheic dermatitis 01/08/2017   Former smoker 09/30/2015   Rectocele 03/29/2015   Thrombocytopenia ([place]) 11/26/2014   CLL (chronic lymphocytic leukemia) ([place]) 11/27/2013   Hyperglycemia 05/08/2013   Essential hypertension, benign 11/07/2012   Obesity, unspecified 11/07/2012    Past Medical History:  Diagnosis Date   Anticoagulant long-term use    eliquis-- managed by cardiology   Arthritis    knees   CKD (chronic kidney disease), stage III ([place])    CLL (chronic lymphocytic leukemia) ([place]) 11/27/2013   oncologist-- dr Alvy Bimler;  initial dx 03/ 2015, no treatment, active survillance   Essential hypertension    followed by pcp   Full dentures    History of gastric ulcer    stomach ulcer when young   Lymphocytosis 2009   followed oncology   Permanent atrial fibrillation ([place]) 11/2020   cardiologist-- primary dr w. Audie Box and followed by atrial  clinic;    dx 003/ 2022, atenolol for rate control and on eliquis   Rectocele    Thrombocythemia 2009   related to CLL, followed by dr Alvy Bimler   Urinary incontinence    Vaginal atrophy     Past Surgical History:  Procedure Laterality Date   APPENDECTOMY     1980s   CATARACT EXTRACTION W/ INTRAOCULAR LENS IMPLANT Bilateral 2020   RECTOCELE REPAIR N/A 08/25/2021   Procedure: POSTERIOR REPAIR (RECTOCELE);  Surgeon: Salvadore Dom, MD;  Location: [facility];  Service: Gynecology;  Laterality: N/A;   TONSILLECTOMY AND ADENOIDECTOMY     age 32    Current Outpatient Medications  Medication Sig Dispense Refill   amLODipine (NORVASC) 10 MG tablet TAKE 1 TABLET BY MOUTH EVERY DAY (Patient taking differently: Take 10 mg by mouth daily.) 90 tablet 3   atenolol (TENORMIN) 100 MG tablet TAKE 1 TABLET BY MOUTH EVERY DAY (Patient taking differently: Take 100 mg by mouth daily.) 90 tablet 3   ELIQUIS 5 MG TABS tablet TAKE 1 TABLET BY MOUTH TWICE A DAY (Patient taking differently: Take 5 mg by mouth 2 (two) times daily.) 60 tablet 5   estradiol (ESTRACE) 0.1 MG/GM vaginal cream 1  gram vaginally every night for the 2 weeks prior to surgery (Patient taking differently: as directed. 1 gram vaginally every night for the 2 weeks prior to surgery) 42.5 g 0   hydrochlorothiazide (HYDRODIURIL) 25 MG tablet TAKE 1 TABLET BY MOUTH EVERY DAY (Patient taking differently: Take 12.5 mg by mouth daily.) 90 tablet 3   losartan (COZAAR) 100 MG tablet TAKE 1 TABLET BY MOUTH EVERY DAY (Patient taking differently: Take 100 mg by mouth daily.) 90 tablet 3   Multiple Vitamins-Minerals (ONE-A-DAY WOMENS 50+ PO) Take by mouth daily.     No current facility-administered medications for this visit.     ALLERGIES: Patient has no known allergies.  Family History  Problem Relation Age of Onset   Healthy Mother        died age 41 (communist country could not get records)   Other Father        died  young-unclear cause   Other Sister        died in 68 "old age. hard to get answers in [place]   Other Sister        72,  hard to get answers in [place]- possible cancer- rectal or colorectal?   Other Sister        died at 53- unclear cause    Social History   Socioeconomic History   Marital status: Widowed    Spouse name: Not on file   Number of children: 3   Years of education: Not on file   Highest education level: Not on file  Occupational History   Occupation: retired  Tobacco Use   Smoking status: Former    Years: 20.00    Types: Cigarettes    Quit date: 1980    Years since quitting: 42.9   Smokeless tobacco: Never  Vaping Use   Vaping Use: Never used  Substance and Sexual Activity   Alcohol use: Not Currently   Drug use: Never   Sexual activity: Not on file  Other Topics Concern   Not on file  Social History Narrative   Widowed April 2016. 3 children. 7 grandkids. 3 greatgrandkids.   Husband worked as Chief Executive Officer.       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      Hobbies: travel- husband was buried at home ([place])   Social Determinants of Radio broadcast assistant Strain: Low Risk    Difficulty of Paying Living Expenses: Not hard at all  Food Insecurity: No Food Insecurity   Worried About Charity fundraiser in the Last Year: Never true   Arboriculturist in the Last Year: Never true  Transportation Needs: No Transportation Needs   Lack of Transportation (Medical): No   Lack of Transportation (Non-Medical): No  Physical Activity: Insufficiently Active   Days of Exercise per Week: 1 day   Minutes of Exercise per Session: 20 min  Stress: No Stress Concern Present   Feeling of Stress : Not at all  Social Connections: Socially Isolated   Frequency of Communication with Friends and Family: Three times a week   Frequency of Social Gatherings with Friends and Family: Twice a week   Attends Religious Services: Never    Marine scientist or Organizations: No   Attends Archivist Meetings: Never   Marital Status: Widowed  Intimate Partner Violence: Not At Risk   Fear of Current or Ex-Partner: No   Emotionally Abused: No   Physically Abused:  No   Sexually Abused: No    Review of Systems  All other systems reviewed and are negative.  PHYSICAL EXAMINATION:    BP 122/74   Pulse 63   Wt 150 lb (68 kg)   SpO2 100%   BMI 29.29 kg/m     General appearance: alert, cooperative and appears stated age Large bruise on her left arm.   Pelvic: External genitalia:  no lesions, bruising on her right buttock.               Urethra:  normal appearing urethra with no masses, tenderness or lesions              Bartholins and Skenes: normal                 Vagina: stitches intact, no signs of infection, not tender. Some fullness in the upper posterior vagina. Suspect stool in the rectum.                Chaperone was present for exam.  1. H/O rectocele repair Doing well F/u in ~3 weeks

## 2021-09-20 ENCOUNTER — Other Ambulatory Visit: Payer: Self-pay

## 2021-09-20 ENCOUNTER — Encounter (HOSPITAL_BASED_OUTPATIENT_CLINIC_OR_DEPARTMENT_OTHER): Payer: Self-pay | Admitting: *Deleted

## 2021-09-20 ENCOUNTER — Emergency Department (HOSPITAL_BASED_OUTPATIENT_CLINIC_OR_DEPARTMENT_OTHER)
Admission: EM | Admit: 2021-09-20 | Discharge: 2021-09-20 | Disposition: A | Discharge status: Home / Self Care | Payer: PPO | Attending: Emergency Medicine | Admitting: Emergency Medicine

## 2021-09-20 ENCOUNTER — Telehealth: Payer: Self-pay | Admitting: Obstetrics & Gynecology

## 2021-09-20 DIAGNOSIS — N183 Chronic kidney disease, stage 3 unspecified: Secondary | ICD-10-CM | POA: Diagnosis not present

## 2021-09-20 DIAGNOSIS — Z7901 Long term (current) use of anticoagulants: Secondary | ICD-10-CM | POA: Diagnosis not present

## 2021-09-20 DIAGNOSIS — Z87891 Personal history of nicotine dependence: Secondary | ICD-10-CM | POA: Insufficient documentation

## 2021-09-20 DIAGNOSIS — Z856 Personal history of leukemia: Secondary | ICD-10-CM | POA: Insufficient documentation

## 2021-09-20 DIAGNOSIS — Z79899 Other long term (current) drug therapy: Secondary | ICD-10-CM | POA: Diagnosis not present

## 2021-09-20 DIAGNOSIS — I129 Hypertensive chronic kidney disease with stage 1 through stage 4 chronic kidney disease, or unspecified chronic kidney disease: Secondary | ICD-10-CM | POA: Diagnosis not present

## 2021-09-20 DIAGNOSIS — N3001 Acute cystitis with hematuria: Secondary | ICD-10-CM | POA: Insufficient documentation

## 2021-09-20 DIAGNOSIS — R319 Hematuria, unspecified: Secondary | ICD-10-CM | POA: Diagnosis present

## 2021-09-20 LAB — URINALYSIS, ROUTINE W REFLEX MICROSCOPIC
Bilirubin Urine: NEGATIVE
Glucose, UA: NEGATIVE mg/dL
Ketones, ur: NEGATIVE mg/dL
Nitrite: NEGATIVE
Protein, ur: 100 mg/dL — AB
RBC / HPF: >50 RBC/hpf — ABNORMAL HIGH (ref 0–5)
Specific Gravity, Urine: 1.009 (ref 1.005–1.030)
WBC, UA: >50 WBC/hpf — ABNORMAL HIGH (ref 0–5)
pH: 6 (ref 5.0–8.0)

## 2021-09-20 MED ORDER — CEPHALEXIN 250 MG PO CAPS
1000.0000 mg | ORAL_CAPSULE | Freq: Once | ORAL | Status: AC
  Administered 2021-09-20: 1000 mg via ORAL
  Filled 2021-09-20: qty 4

## 2021-09-20 MED ORDER — CEPHALEXIN 500 MG PO CAPS: ORAL_CAPSULE | ORAL | 0 refills | Status: DC

## 2021-09-20 NOTE — ED Notes (Signed)
Dc instructions reviewed with patient. Patient voiced understanding. Dc with belongings.  °

## 2021-09-20 NOTE — Discharge Instructions (Signed)
Follow up with your PCP.  I have given you follow up for urology.  Please return for worsening symptoms, fever.

## 2021-09-20 NOTE — ED Triage Notes (Addendum)
Bloody urine since last night.  Patient is on Eliquis.  Patient stated that it burns during urination.

## 2021-09-20 NOTE — ED Notes (Signed)
Pt refused vital sign recheck.

## 2021-09-20 NOTE — Telephone Encounter (Signed)
Daughter of patient called.  Urinary incontinence with hematuria.  Recommended to present at outpatient [facility] for U/A and Culture prior to Antibiotic treatment as indicated.  If no evidence of UTI, hematuria will need further investigation.  Daughter voiced understanding and agreement.

## 2021-09-20 NOTE — ED Provider Notes (Signed)
[facility] EMERGENCY DEPT Provider Note   CSN: [id number] Arrival date & time: 09/20/21  1206     History Chief Complaint  Patient presents with   Hematuria    Maria Lowe is a 87 y.o. female.  87 yo F with a cc of hematuria and pain with urination.  Going on since last night.  Has a rectal prolapse history and has surgery planned for the next couple weeks.  She is on a blood thinning medication as well.  She denies flank pain denies fevers denies trauma.  The history is provided by the patient.  Hematuria This is a new problem. The current episode started 6 to 12 hours ago. The problem occurs constantly. The problem has not changed since onset.Pertinent negatives include no chest pain, no headaches and no shortness of breath. Nothing aggravates the symptoms. Nothing relieves the symptoms.      Past Medical History:  Diagnosis Date   Anticoagulant long-term use    eliquis-- managed by cardiology   Arthritis    knees   CKD (chronic kidney disease), stage III ([place])    CLL (chronic lymphocytic leukemia) ([place]) 11/27/2013   oncologist-- dr Alvy Bimler;  initial dx 03/ 2015, no treatment, active survillance   Essential hypertension    followed by pcp   Full dentures    History of gastric ulcer    stomach ulcer when young   Lymphocytosis 2009   followed oncology   Permanent atrial fibrillation ([place]) 11/2020   cardiologist-- primary dr w. Audie Box and followed by atrial clinic;    dx 003/ 2022, atenolol for rate control and on eliquis   Rectocele    Thrombocythemia 2009   related to CLL, followed by dr Alvy Bimler   Urinary incontinence    Vaginal atrophy     Patient Active Problem List   Diagnosis Date Noted   H/O rectocele repair 08/25/2021   Permanent atrial fibrillation ([place]) 04/24/2021   Secondary hypercoagulable state ([place]) 12/10/2020   Persistent atrial fibrillation ([place]) 11/29/2020   Hyperlipidemia, unspecified 04/07/2018   CKD (chronic kidney  disease), stage III ([place]) 02/09/2018   Rash 02/09/2018   Seborrheic dermatitis 01/08/2017   Former smoker 09/30/2015   Rectocele 03/29/2015   Thrombocytopenia ([place]) 11/26/2014   CLL (chronic lymphocytic leukemia) ([place]) 11/27/2013   Hyperglycemia 05/08/2013   Essential hypertension, benign 11/07/2012   Obesity, unspecified 11/07/2012    Past Surgical History:  Procedure Laterality Date   APPENDECTOMY     1980s   CATARACT EXTRACTION W/ INTRAOCULAR LENS IMPLANT Bilateral 2020   RECTOCELE REPAIR N/A 08/25/2021   Procedure: POSTERIOR REPAIR (RECTOCELE);  Surgeon: Salvadore Dom, MD;  Location: [facility];  Service: Gynecology;  Laterality: N/A;   TONSILLECTOMY AND ADENOIDECTOMY     age 7     OB History     Gravida  3   Para  3   Term  3   Preterm      AB      Living  3      SAB      IAB      Ectopic      Multiple      Live Births  3           Family History  Problem Relation Age of Onset   Healthy Mother        died age 33 (communist country could not get records)   Other Father        died  young-unclear cause   Other Sister        died in 90 "old age. hard to get answers in [place]   Other Sister        63,  hard to get answers in [place]- possible cancer- rectal or colorectal?   Other Sister        died at 52- unclear cause    Social History   Tobacco Use   Smoking status: Former    Years: 20.00    Types: Cigarettes    Quit date: 1980    Years since quitting: 43.0   Smokeless tobacco: Never  Vaping Use   Vaping Use: Never used  Substance Use Topics   Alcohol use: Not Currently   Drug use: Never    Home Medications Prior to Admission medications   Medication Sig Start Date End Date Taking? Authorizing Provider  cephALEXin (KEFLEX) 500 MG capsule 2 caps po bid x 7 days 09/20/21  Yes Deno Etienne, DO  amLODipine (NORVASC) 10 MG tablet TAKE 1 TABLET BY MOUTH EVERY DAY Patient taking differently: Take 10 mg by mouth  daily. 05/12/21   [name], MD  atenolol (TENORMIN) 100 MG tablet TAKE 1 TABLET BY MOUTH EVERY DAY Patient taking differently: Take 100 mg by mouth daily. 06/24/21   [name], MD  ELIQUIS 5 MG TABS tablet TAKE 1 TABLET BY MOUTH TWICE A DAY Patient taking differently: Take 5 mg by mouth 2 (two) times daily. 05/21/21   [name], MD  estradiol (ESTRACE) 0.1 MG/GM vaginal cream 1 gram vaginally every night for the 2 weeks prior to surgery Patient taking differently: as directed. 1 gram vaginally every night for the 2 weeks prior to surgery 06/24/21   Salvadore Dom, MD  hydrochlorothiazide (HYDRODIURIL) 25 MG tablet TAKE 1 TABLET BY MOUTH EVERY DAY Patient taking differently: Take 12.5 mg by mouth daily. 05/20/21   [name], MD  losartan (COZAAR) 100 MG tablet TAKE 1 TABLET BY MOUTH EVERY DAY Patient taking differently: Take 100 mg by mouth daily. 05/27/21   [name], MD  Multiple Vitamins-Minerals (ONE-A-DAY WOMENS 50+ PO) Take by mouth daily.    [provider]    Allergies    Patient has no known allergies.  Review of Systems   Review of Systems  Constitutional:  Negative for chills and fever.  HENT:  Negative for congestion and rhinorrhea.   Eyes:  Negative for redness and visual disturbance.  Respiratory:  Negative for shortness of breath and wheezing.   Cardiovascular:  Negative for chest pain and palpitations.  Gastrointestinal:  Negative for nausea and vomiting.  Genitourinary:  Positive for dysuria and hematuria. Negative for urgency.  Musculoskeletal:  Negative for arthralgias and myalgias.  Skin:  Negative for pallor and wound.  Neurological:  Negative for dizziness and headaches.   Physical Exam Updated Vital Signs BP (!) 150/75    Pulse 90    Temp 97.7 F (36.5 C) (Oral)    Resp 18    Ht 5\' 1"  (1.549 m)    Wt 65.8 kg    SpO2 100%    BMI 27.40 kg/m   Physical Exam Vitals and nursing note reviewed.  Constitutional:       General: She is not in acute distress.    Appearance: She is well-developed. She is not diaphoretic.  HENT:     Head: Normocephalic and atraumatic.  Eyes:     Pupils: Pupils are equal, round, and  reactive to light.  Cardiovascular:     Rate and Rhythm: Normal rate and regular rhythm.     Heart sounds: No murmur heard.   No friction rub. No gallop.  Pulmonary:     Effort: Pulmonary effort is normal.     Breath sounds: No wheezing or rales.  Abdominal:     General: There is no distension.     Palpations: Abdomen is soft.     Tenderness: There is no abdominal tenderness. There is no right CVA tenderness or left CVA tenderness.  Musculoskeletal:        General: No tenderness.     Cervical back: Normal range of motion and neck supple.  Skin:    General: Skin is warm and dry.  Neurological:     Mental Status: She is alert and oriented to person, place, and time.  Psychiatric:        Behavior: Behavior normal.    ED Results / Procedures / Treatments   Labs (all labs ordered are listed, but only abnormal results are displayed) Labs Reviewed  URINALYSIS, ROUTINE W REFLEX MICROSCOPIC - Abnormal; Notable for the following components:      Result Value   Color, Urine BROWN (*)    APPearance CLOUDY (*)    Hgb urine dipstick LARGE (*)    Protein, ur 100 (*)    Leukocytes,Ua LARGE (*)    RBC / HPF >50 (*)    WBC, UA >50 (*)    Bacteria, UA MANY (*)    Non Squamous Epithelial 6-10 (*)    All other components within normal limits    EKG None  Radiology No results found.  Procedures Procedures   Medications Ordered in ED Medications  cephALEXin (KEFLEX) capsule 1,000 mg (1,000 mg Oral Given 09/20/21 1705)    ED Course  I have reviewed the triage vital signs and the nursing notes.  Pertinent labs & imaging results that were available during my care of the patient were reviewed by me and considered in my medical decision making (see chart for details).    MDM  Rules/Calculators/A&P                         87 yo F with a chief complaints of hematuria and dysuria.  Based on her history most likely this is a urinary tract infection.  UA is consistent with UTI.  Will start on antibiotics.  I feel that kidney stone is less likely based on history.  Patient is comfortable with the diagnosis and will start antibiotics.  Given urology follow-up if needed.  5:09 PM:  I have discussed the diagnosis/risks/treatment options with the patient and believe the pt to be eligible for discharge home to follow-up with PCP, Urology. We also discussed returning to the ED immediately if new or worsening sx occur. We discussed the sx which are most concerning (e.g., sudden worsening pain, fever, inability to tolerate by mouth) that necessitate immediate return. Medications administered to the patient during their visit and any new prescriptions provided to the patient are listed below.  Medications given during this visit Medications  cephALEXin (KEFLEX) capsule 1,000 mg (1,000 mg Oral Given 09/20/21 1705)     The patient appears reasonably screen and/or stabilized for discharge and I doubt any other medical condition or other [facility] requiring further screening, evaluation, or treatment in the ED at this time prior to discharge.      Final Clinical Impression(s) / ED Diagnoses  Final diagnoses:  Acute cystitis with hematuria    Rx / DC Orders ED Discharge Orders          Ordered    cephALEXin (KEFLEX) 500 MG capsule        09/20/21 [place], [place], DO 09/20/21 1709

## 2021-09-24 ENCOUNTER — Other Ambulatory Visit: Payer: Self-pay | Admitting: Obstetrics and Gynecology

## 2021-09-24 ENCOUNTER — Encounter: Payer: PPO | Admitting: Obstetrics and Gynecology

## 2021-09-24 DIAGNOSIS — N952 Postmenopausal atrophic vaginitis: Secondary | ICD-10-CM

## 2021-09-24 NOTE — Telephone Encounter (Signed)
Prescribed 06/24/21 "2. Vaginal atrophy Will pretreat with vaginal estrogen.  - estradiol (ESTRACE) 0.1 MG/GM vaginal cream; 1 gram vaginally every night for the 2 weeks prior to surgery ."

## 2021-10-01 ENCOUNTER — Ambulatory Visit (INDEPENDENT_AMBULATORY_CARE_PROVIDER_SITE_OTHER): Payer: PPO | Admitting: Obstetrics and Gynecology

## 2021-10-01 ENCOUNTER — Encounter: Payer: Self-pay | Admitting: Obstetrics and Gynecology

## 2021-10-01 ENCOUNTER — Other Ambulatory Visit: Payer: Self-pay

## 2021-10-01 DIAGNOSIS — Z9889 Other specified postprocedural states: Secondary | ICD-10-CM

## 2021-10-01 DIAGNOSIS — N952 Postmenopausal atrophic vaginitis: Secondary | ICD-10-CM

## 2021-10-01 NOTE — Progress Notes (Signed)
See Dr Jertson's note 

## 2021-10-01 NOTE — Progress Notes (Signed)
GYNECOLOGY  VISIT   HPI: 88 y.o.   Widowed White or Caucasian Not Hispanic or Latino  female   [phone] with No LMP recorded. Patient is postmenopausal.   here for a post operative check. She is s/p a rectocele and enterocele repair on 08/25/21. No pain. Normal bowel or bladder c/o. No vaginal bleeding. More comfortable sitting since surgery.   GYNECOLOGIC HISTORY: No LMP recorded. Patient is postmenopausal. Contraception: PMP Menopausal hormone therapy: none        OB History     Gravida  3   Para  3   Term  3   Preterm      AB      Living  3      SAB      IAB      Ectopic      Multiple      Live Births  3              Patient Active Problem List   Diagnosis Date Noted   H/O rectocele repair 08/25/2021   Permanent atrial fibrillation ([place]) 04/24/2021   Secondary hypercoagulable state ([place]) 12/10/2020   Persistent atrial fibrillation ([place]) 11/29/2020   Hyperlipidemia, unspecified 04/07/2018   CKD (chronic kidney disease), stage III ([place]) 02/09/2018   Rash 02/09/2018   Seborrheic dermatitis 01/08/2017   Former smoker 09/30/2015   Rectocele 03/29/2015   Thrombocytopenia ([place]) 11/26/2014   CLL (chronic lymphocytic leukemia) ([place]) 11/27/2013   Hyperglycemia 05/08/2013   Essential hypertension, benign 11/07/2012   Obesity, unspecified 11/07/2012    Past Medical History:  Diagnosis Date   Anticoagulant long-term use    eliquis-- managed by cardiology   Arthritis    knees   CKD (chronic kidney disease), stage III ([place])    CLL (chronic lymphocytic leukemia) ([place]) 11/27/2013   oncologist-- dr Alvy Bimler;  initial dx 03/ 2015, no treatment, active survillance   Essential hypertension    followed by pcp   Full dentures    History of gastric ulcer    stomach ulcer when young   Lymphocytosis 2009   followed oncology   Permanent atrial fibrillation ([place]) 11/2020   cardiologist-- primary dr w. Audie Box and followed by atrial clinic;    dx 003/ 2022, atenolol  for rate control and on eliquis   Rectocele    Thrombocythemia 2009   related to CLL, followed by dr Alvy Bimler   Urinary incontinence    Vaginal atrophy     Past Surgical History:  Procedure Laterality Date   APPENDECTOMY     1980s   CATARACT EXTRACTION W/ INTRAOCULAR LENS IMPLANT Bilateral 2020   RECTOCELE REPAIR N/A 08/25/2021   Procedure: POSTERIOR REPAIR (RECTOCELE);  Surgeon: Salvadore Dom, MD;  Location: [facility];  Service: Gynecology;  Laterality: N/A;   TONSILLECTOMY AND ADENOIDECTOMY     age 36    Current Outpatient Medications  Medication Sig Dispense Refill   amLODipine (NORVASC) 10 MG tablet TAKE 1 TABLET BY MOUTH EVERY DAY (Patient taking differently: Take 10 mg by mouth daily.) 90 tablet 3   atenolol (TENORMIN) 100 MG tablet TAKE 1 TABLET BY MOUTH EVERY DAY (Patient taking differently: Take 100 mg by mouth daily.) 90 tablet 3   cephALEXin (KEFLEX) 500 MG capsule 2 caps po bid x 7 days 28 capsule 0   ELIQUIS 5 MG TABS tablet TAKE 1 TABLET BY MOUTH TWICE A DAY (Patient taking differently: Take 5 mg by mouth 2 (two) times daily.) 60 tablet 5  estradiol (ESTRACE) 0.1 MG/GM vaginal cream 1 gram vaginally every night for the 2 weeks prior to surgery (Patient taking differently: as directed. 1 gram vaginally every night for the 2 weeks prior to surgery) 42.5 g 0   hydrochlorothiazide (HYDRODIURIL) 25 MG tablet TAKE 1 TABLET BY MOUTH EVERY DAY (Patient taking differently: Take 12.5 mg by mouth daily.) 90 tablet 3   losartan (COZAAR) 100 MG tablet TAKE 1 TABLET BY MOUTH EVERY DAY (Patient taking differently: Take 100 mg by mouth daily.) 90 tablet 3   Multiple Vitamins-Minerals (ONE-A-DAY WOMENS 50+ PO) Take by mouth daily.     No current facility-administered medications for this visit.     ALLERGIES: Patient has no known allergies.  Family History  Problem Relation Age of Onset   Healthy Mother        died age 75 (communist country could not get  records)   Other Father        died young-unclear cause   Other Sister        died in 37 "old age. hard to get answers in [place]   Other Sister        46,  hard to get answers in [place]- possible cancer- rectal or colorectal?   Other Sister        died at 23- unclear cause    Social History   Socioeconomic History   Marital status: Widowed    Spouse name: Not on file   Number of children: 3   Years of education: Not on file   Highest education level: Not on file  Occupational History   Occupation: retired  Tobacco Use   Smoking status: Former    Years: 20.00    Types: Cigarettes    Quit date: 1980    Years since quitting: 43.0   Smokeless tobacco: Never  Vaping Use   Vaping Use: Never used  Substance and Sexual Activity   Alcohol use: Not Currently   Drug use: Never   Sexual activity: Not on file  Other Topics Concern   Not on file  Social History Narrative   Widowed April 2016. 3 children. 7 grandkids. 3 greatgrandkids.   Husband worked as Chief Executive Officer.       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      Hobbies: travel- husband was buried at home ([place])   Social Determinants of Radio broadcast assistant Strain: Low Risk    Difficulty of Paying Living Expenses: Not hard at all  Food Insecurity: No Food Insecurity   Worried About Charity fundraiser in the Last Year: Never true   Arboriculturist in the Last Year: Never true  Transportation Needs: No Transportation Needs   Lack of Transportation (Medical): No   Lack of Transportation (Non-Medical): No  Physical Activity: Insufficiently Active   Days of Exercise per Week: 1 day   Minutes of Exercise per Session: 20 min  Stress: No Stress Concern Present   Feeling of Stress : Not at all  Social Connections: Socially Isolated   Frequency of Communication with Friends and Family: Three times a week   Frequency of Social Gatherings with Friends and Family: Twice a week    Attends Religious Services: Never   Marine scientist or Organizations: No   Attends Archivist Meetings: Never   Marital Status: Widowed  Intimate Partner Violence: Not At Risk   Fear of Current or Ex-Partner: No  Emotionally Abused: No   Physically Abused: No   Sexually Abused: No    ROS  PHYSICAL EXAMINATION:    There were no vitals taken for this visit.    General appearance: alert, cooperative and appears stated age  Pelvic: External genitalia:  no lesions              Urethra:  normal appearing urethra with no masses, tenderness or lesions              Bartholins and Skenes: normal                 Vagina: atrophic, healing well from her surgery, no signs of infection. Not tender, no prolpase.              Cervix: no lesions  Rectovaginal exam: same.                Chaperone was present for exam.  1. H/O rectocele repair Recovering well, no c/o F/U as needed  2. Vaginal atrophy Recommended that she restart the vaginal estrogen and use it 2 x a week for the next month to help with healing.

## 2021-10-21 ENCOUNTER — Emergency Department (HOSPITAL_COMMUNITY): Payer: PPO

## 2021-10-21 ENCOUNTER — Encounter (HOSPITAL_COMMUNITY): Payer: Self-pay | Admitting: Emergency Medicine

## 2021-10-21 ENCOUNTER — Other Ambulatory Visit: Payer: Self-pay

## 2021-10-21 ENCOUNTER — Emergency Department (HOSPITAL_COMMUNITY)
Admission: EM | Admit: 2021-10-21 | Discharge: 2021-10-22 | Disposition: A | Discharge status: Home / Self Care | Payer: PPO | Attending: Emergency Medicine | Admitting: Emergency Medicine

## 2021-10-21 DIAGNOSIS — R42 Dizziness and giddiness: Secondary | ICD-10-CM

## 2021-10-21 DIAGNOSIS — Z79899 Other long term (current) drug therapy: Secondary | ICD-10-CM | POA: Diagnosis not present

## 2021-10-21 DIAGNOSIS — R519 Headache, unspecified: Secondary | ICD-10-CM | POA: Insufficient documentation

## 2021-10-21 DIAGNOSIS — G459 Transient cerebral ischemic attack, unspecified: Secondary | ICD-10-CM | POA: Diagnosis not present

## 2021-10-21 DIAGNOSIS — G319 Degenerative disease of nervous system, unspecified: Secondary | ICD-10-CM | POA: Diagnosis not present

## 2021-10-21 DIAGNOSIS — R262 Difficulty in walking, not elsewhere classified: Secondary | ICD-10-CM | POA: Diagnosis not present

## 2021-10-21 LAB — BASIC METABOLIC PANEL
Anion gap: 13 (ref 5–15)
BUN: 18 mg/dL (ref 8–23)
CO2: 24 mmol/L (ref 22–32)
Calcium: 9.4 mg/dL (ref 8.9–10.3)
Chloride: 102 mmol/L (ref 98–111)
Creatinine, Ser: 0.98 mg/dL (ref 0.44–1.00)
GFR, Estimated: 56 mL/min — ABNORMAL LOW (ref >60)
Glucose, Bld: 124 mg/dL — ABNORMAL HIGH (ref 70–99)
Potassium: 4 mmol/L (ref 3.5–5.1)
Sodium: 139 mmol/L (ref 135–145)

## 2021-10-21 LAB — CBC
HCT: 43.7 % (ref 36.0–46.0)
Hemoglobin: 13.9 g/dL (ref 12.0–15.0)
MCH: 28.4 pg (ref 26.0–34.0)
MCHC: 31.8 g/dL (ref 30.0–36.0)
MCV: 89.4 fL (ref 80.0–100.0)
Platelets: 109 10*3/uL — ABNORMAL LOW (ref 150–400)
RBC: 4.89 MIL/uL (ref 3.87–5.11)
RDW: 14.6 % (ref 11.5–15.5)
WBC: 16.7 10*3/uL — ABNORMAL HIGH (ref 4.0–10.5)
nRBC: 0.2 % (ref 0.0–0.2)

## 2021-10-21 LAB — CBG MONITORING, ED: Glucose-Capillary: 119 mg/dL — ABNORMAL HIGH (ref 70–99)

## 2021-10-21 MED ORDER — LORAZEPAM 1 MG PO TABS: 0.5000 mg | ORAL_TABLET | Freq: Once | ORAL | Status: DC

## 2021-10-21 NOTE — ED Provider Triage Note (Signed)
Emergency Medicine Provider Triage Evaluation Note  Maria Lowe , a 88 y.o. female  was evaluated in triage.  Pt complains of dizziness, shakiness, and vision changes starting at 3 PM.  Patient states that symptoms came on abruptly, but have mostly resolved.  She was complaining of some lines in her vision on her left side, this is totally resolved.  She is still feeling shaky and tired.  Negative orthostatics for EMS.  No history of stroke.  Does have history of atrial fibrillation.  Review of Systems  Positive: Shakiness, lightheadedness Negative: Vision changes, head trauma, syncope  Physical Exam  BP (!) 157/77 (BP Location: Left Arm)    Pulse 92    Temp 98 F (36.7 C) (Oral)    Resp 15    Ht 5\' 1"  (1.549 m)    Wt 65.8 kg    SpO2 96%    BMI 27.40 kg/m  Gen:   Awake, no distress   Resp:  Normal effort  MSK:   Moves extremities without difficulty  Other:  5/5 strength in all extremities, no pronator drift, no slurred speech, no facial droop  Medical Decision Making  Medically screening exam initiated at 7:17 PM.  Appropriate orders placed.  Maria Lowe was informed that the remainder of the evaluation will be completed by another provider, this initial triage assessment does not replace that evaluation, and the importance of remaining in the ED until their evaluation is complete.  Will obtain basic lab work and imaging with greater concern for TIA versus acute stroke   Kateri Plummer, PA-C 10/21/21 1935

## 2021-10-21 NOTE — ED Triage Notes (Signed)
Per [facility] pt coming from home where she lives alone. LNK 10am today. Symptoms started 3pm today. Patient reports shakiness, difficulty ambulating, and feeling dizzy. Symptoms have subsided. Negative orthostatics for EMS. 18G to RAC.

## 2021-10-22 LAB — URINALYSIS, ROUTINE W REFLEX MICROSCOPIC
Bilirubin Urine: NEGATIVE
Glucose, UA: NEGATIVE mg/dL
Hgb urine dipstick: NEGATIVE
Ketones, ur: NEGATIVE mg/dL
Nitrite: NEGATIVE
Protein, ur: NEGATIVE mg/dL
Specific Gravity, Urine: <1.005 — ABNORMAL LOW (ref 1.005–1.030)
pH: 5.5 (ref 5.0–8.0)

## 2021-10-22 LAB — URINALYSIS, MICROSCOPIC (REFLEX): RBC / HPF: NONE SEEN RBC/hpf (ref 0–5)

## 2021-10-22 MED ORDER — MECLIZINE HCL 25 MG PO TABS: 25.0000 mg | ORAL_TABLET | Freq: Three times a day (TID) | ORAL | 0 refills | Status: DC | PRN

## 2021-10-22 NOTE — ED Provider Notes (Signed)
[facility] EMERGENCY DEPARTMENT Provider Note   CSN: [id number] Arrival date & time: 10/21/21  1904     History  Chief Complaint  Patient presents with   Dizziness    Maria Lowe is a 88 y.o. female.  Presents to the ED with dizziness.  History of atrial fibrillation on Eliquis, high blood pressure, CLL in remission.  Had an episode of dizziness and lightheadedness yesterday.  Felt unsteady walking and had a headache.  Symptoms are now resolved upon my evaluation.  History of the same.  Denies any history of stroke.  Denies any chest pain or shortness of breath.  The history is provided by the patient.  Dizziness Quality:  Head spinning Severity:  Mild Duration:  15 hours Progression:  Resolved Chronicity:  Recurrent Context: head movement   Relieved by:  Being still Worsened by:  Nothing Associated symptoms: no blood in stool, no chest pain, no diarrhea, no headaches, no hearing loss, no nausea, no palpitations, no shortness of breath, no syncope, no tinnitus, no vision changes, no vomiting and no weakness   Risk factors: multiple medications       Home Medications Prior to Admission medications   Medication Sig Start Date End Date Taking? Authorizing Provider  meclizine (ANTIVERT) 25 MG tablet Take 1 tablet (25 mg total) by mouth 3 (three) times daily as needed for dizziness. 10/22/21  Yes Irena Gaydos, DO  amLODipine (NORVASC) 10 MG tablet TAKE 1 TABLET BY MOUTH EVERY DAY Patient taking differently: Take 10 mg by mouth daily. 05/12/21   [name], MD  atenolol (TENORMIN) 100 MG tablet TAKE 1 TABLET BY MOUTH EVERY DAY Patient taking differently: Take 100 mg by mouth daily. 06/24/21   [name], MD  ELIQUIS 5 MG TABS tablet TAKE 1 TABLET BY MOUTH TWICE A DAY Patient taking differently: Take 5 mg by mouth 2 (two) times daily. 05/21/21   [name], MD  hydrochlorothiazide (HYDRODIURIL) 25 MG tablet TAKE 1 TABLET BY MOUTH EVERY  DAY Patient taking differently: Take 12.5 mg by mouth daily. 05/20/21   [name], MD  losartan (COZAAR) 100 MG tablet TAKE 1 TABLET BY MOUTH EVERY DAY Patient taking differently: Take 100 mg by mouth daily. 05/27/21   [name], MD  Multiple Vitamins-Minerals (ONE-A-DAY WOMENS 50+ PO) Take by mouth daily.    [provider]      Allergies    Patient has no known allergies.    Review of Systems   Review of Systems  HENT:  Negative for hearing loss and tinnitus.   Respiratory:  Negative for shortness of breath.   Cardiovascular:  Negative for chest pain, palpitations and syncope.  Gastrointestinal:  Negative for blood in stool, diarrhea, nausea and vomiting.  Neurological:  Positive for dizziness. Negative for weakness and headaches.   Physical Exam Updated Vital Signs BP (!) 141/88    Pulse 91    Temp 98 F (36.7 C) (Oral)    Resp 17    Ht 5\' 1"  (1.549 m)    Wt 65.8 kg    SpO2 98%    BMI 27.40 kg/m  Physical Exam Vitals and nursing note reviewed.  Constitutional:      General: She is not in acute distress.    Appearance: She is well-developed. She is not ill-appearing.  HENT:     Head: Normocephalic and atraumatic.     Nose: Nose normal.     Mouth/Throat:  Mouth: Mucous membranes are moist.  Eyes:     Extraocular Movements: Extraocular movements intact.     Conjunctiva/sclera: Conjunctivae normal.     Pupils: Pupils are equal, round, and reactive to light.  Cardiovascular:     Rate and Rhythm: Normal rate and regular rhythm.     Pulses: Normal pulses.     Heart sounds: Normal heart sounds. No murmur heard. Pulmonary:     Effort: Pulmonary effort is normal. No respiratory distress.     Breath sounds: Normal breath sounds.  Abdominal:     Palpations: Abdomen is soft.     Tenderness: There is no abdominal tenderness.  Musculoskeletal:        General: No swelling. Normal range of motion.     Cervical back: Normal range of motion and neck supple.   Skin:    General: Skin is warm and dry.     Capillary Refill: Capillary refill takes less than 2 seconds.  Neurological:     General: No focal deficit present.     Mental Status: She is alert and oriented to person, place, and time.     Cranial Nerves: No cranial nerve deficit.     Sensory: No sensory deficit.     Motor: No weakness.     Coordination: Coordination normal.     Gait: Gait normal.     Comments: 5+ out of 5 strength throughout, normal sensation, no drift, normal gait, normal finger-to-nose finger, normal speech, normal visual fields  Psychiatric:        Mood and Affect: Mood normal.    ED Results / Procedures / Treatments   Labs (all labs ordered are listed, but only abnormal results are displayed) Labs Reviewed  BASIC METABOLIC PANEL - Abnormal; Notable for the following components:      Result Value   Glucose, Bld 124 (*)    GFR, Estimated 56 (*)    All other components within normal limits  CBC - Abnormal; Notable for the following components:   WBC 16.7 (*)    Platelets 109 (*)    All other components within normal limits  URINALYSIS, ROUTINE W REFLEX MICROSCOPIC - Abnormal; Notable for the following components:   Specific Gravity, Urine <1.005 (*)    Leukocytes,Ua TRACE (*)    All other components within normal limits  URINALYSIS, MICROSCOPIC (REFLEX) - Abnormal; Notable for the following components:   Bacteria, UA RARE (*)    All other components within normal limits  CBG MONITORING, ED - Abnormal; Notable for the following components:   Glucose-Capillary 119 (*)    All other components within normal limits    EKG EKG Interpretation  Date/Time:  Tuesday October 21 2021 19:10:21 EST Ventricular Rate:  104 PR Interval:    QRS Duration: 114 QT Interval:  338 QTC Calculation: 444 R Axis:   2 Text Interpretation: Atrial fibrillation with rapid ventricular response with premature ventricular or aberrantly conducted complexes When compared with ECG of  30-May-2021 10:22, PREVIOUS ECG IS PRESENT Confirmed by Kommor, Madison ([id number]) on 10/22/2021 7:12:52 AM  Radiology MR BRAIN WO CONTRAST  Result Date: 10/21/2021 CLINICAL DATA:  TIA. Dizzy. Difficulty ambulating. EXAM: MRI HEAD WITHOUT CONTRAST TECHNIQUE: Multiplanar, multiecho pulse sequences of the brain and surrounding structures were obtained without intravenous contrast. COMPARISON:  CT head without contrast 05/30/2021. MR head without and with contrast 05/09/2017. FINDINGS: Brain: No acute infarct, hemorrhage, or mass lesion is present. Mild atrophy and white matter changes are likely within normal limits for  age. A remote lacunar infarct subjacent to the anterior right insula stable. The ventricles are proportionate to the degree of atrophy. The internal auditory canals are within normal limits. The brainstem and cerebellum are within normal limits. Vascular: Flow is present in the major intracranial arteries. Skull and upper cervical spine: The craniocervical junction is normal. Upper cervical spine is within normal limits. Marrow signal is unremarkable. Sinuses/Orbits: The paranasal sinuses and mastoid air cells are clear. Bilateral lens replacements are noted. Globes and orbits are otherwise unremarkable. IMPRESSION: 1. No acute intracranial abnormality or significant interval change. 2. Stable atrophy and white matter disease, likely within normal limits for age. 3. Remote lacunar infarct of the anterior right insula. Electronically Signed   By: San Morelle M.D.   On: 10/21/2021 21:27    Procedures Procedures    Medications Ordered in ED Medications  LORazepam (ATIVAN) tablet 0.5 mg (has no administration in time range)    ED Course/ Medical Decision Making/ A&P                           Medical Decision Making  Malak S Moates is here after episode of dizziness.  Normal vitals.  No fever.  History of high blood pressure, atrial fibrillation on Eliquis, CKD.  An episode of  dizziness, headache, lightheadedness yesterday.  Has now resolved.  Ambulatory with me in the room.  Neurologically she is intact.  Normal cerebellar signs.  Normal strength and sensation.  Normal speech.  No vision changes.  This seems to be positional per the patient.  Has a history of similar symptoms in the past.  States that she has had vestibular therapy in the past.  However has not seen ear nose and throat doctor or neurologist in the past for this.  She does follow cardiology.  She denies any chest pain or shortness of breath.  No abdominal pain.  She states that she has ringing in the ears at times.  Differential diagnosis includes stroke versus peripheral vertigo versus dehydration versus electrolyte abnormality.  Orthostatics are normal.  Overall she appears to be asymptomatic.  Lab work and MRI already ordered prior to my evaluation.  She has had CBC, BMP, MRI of the brain.  Per my review and interpretation of her labs there is no significant anemia, electrolyte ab Mody, kidney injury.  Per radiology's read of MRI patient with no evidence of acute stroke.  EKG per my review shows atrial fibrillation rate controlled.  No ischemic changes. She does have some small matter disease.  Appears to have a remote lacunar infarct as well.  She is on Eliquis.  We will have her follow-up with her primary care doctor to discuss need for referral to ENT/neurology.  We will start her on meclizine to take as needed for possible peripheral vertigo as well.  She appears at her baseline.  I am not concerned from any stroke.  She appears well and this overall appears to be an acute on chronic issue.  Discharged in good condition.  Understands return precautions.  This chart was dictated using voice recognition software.  Despite best efforts to proofread,  errors can occur which can change the documentation meaning.          Final Clinical Impression(s) / ED Diagnoses Final diagnoses:  Dizziness    Rx /  DC Orders ED Discharge Orders          Ordered    meclizine (ANTIVERT) 25  MG tablet  3 times daily PRN        10/22/21 0909              Lennice Sites, DO 10/22/21 8101

## 2021-10-22 NOTE — Discharge Instructions (Addendum)
Overall suspect that dizziness may be from peripheral vertigo and inner ear problem.  However MRI does show evidence of old stroke.  I have given you information to follow-up with both ENT and neurology however recommend follow-up with your primary care doctor to further dictate your care going forward.  I have prescribed you meclizine to help when you have episode of dizziness as discussed.  However please return if you develop any stroke symptoms including worsening/more persistent dizziness, speech changes, vision changes, weakness, numbness.

## 2021-11-18 ENCOUNTER — Other Ambulatory Visit: Payer: Self-pay | Admitting: Family Medicine

## 2021-11-20 NOTE — Progress Notes (Signed)
Chronic Care Management Pharmacy Note  11/25/2021 Name:  Maria Lowe MRN:  [id number] DOB:  September 27, 1930  Summary: FU visit patient doing well.  Denies any recent dizziness.  No changes or recommendations at this time.   Subjective: Maria Lowe is an 88 y.o. year old female who is a primary patient of Hunter, Brayton Mars, MD.  The CCM team was consulted for assistance with disease management and care coordination needs.    Engaged with patient by telephone for follow up visit in response to provider referral for pharmacy case management and/or care coordination services.   Consent to Services:  The patient was given the following information about Chronic Care Management services today, agreed to services, and gave verbal consent: 1. CCM service includes personalized support from designated clinical staff supervised by the primary care provider, including individualized plan of care and coordination with other care providers 2. 24/7 contact phone numbers for assistance for urgent and routine care needs. 3. Service will only be billed when office clinical staff spend 20 minutes or more in a month to coordinate care. 4. Only one practitioner may furnish and bill the service in a calendar month. 5.The patient may stop CCM services at any time (effective at the end of the month) by phone call to the office staff. 6. The patient will be responsible for cost sharing (co-pay) of up to 20% of the service fee (after annual deductible is met). Patient agreed to services and consent obtained.  Patient Care Team: [name], MD as PCP - General (Family Medicine) Heath Lark, MD as Consulting Physician (Hematology and Oncology) Edythe Clarity, [facility] (Pharmacist)  Recent office visits: None since last Ccm call  Recent consult visits: 04/24/21 Marlene Lard, Cardiology) - no changes to medication, refular Afib follow up  Hospital visits: None in previous 6 months   Objective:  Lab Results   Component Value Date   CREATININE 0.98 10/21/2021   BUN 18 10/21/2021   GFR 50.64 (L) 01/09/2021   GFRNONAA 56 (L) 10/21/2021   GFRAA 68 05/30/2020   NA 139 10/21/2021   K 4.0 10/21/2021   CALCIUM 9.4 10/21/2021   CO2 24 10/21/2021   GLUCOSE 124 (H) 10/21/2021    Lab Results  Component Value Date/Time   HGBA1C 5.9 11/29/2020 10:23 AM   HGBA1C 5.5 05/30/2020 02:19 PM   GFR 50.64 (L) 01/09/2021 09:33 AM   GFR 46.73 (L) 11/29/2020 10:23 AM    Last diabetic Eye exam: No results found for: HMDIABEYEEXA  Last diabetic Foot exam: No results found for: HMDIABFOOTEX   Lab Results  Component Value Date   CHOL 212 (H) 05/30/2020   HDL 69 05/30/2020   LDLCALC 113 (H) 05/30/2020   TRIG 189 (H) 05/30/2020   CHOLHDL 3.1 05/30/2020    Hepatic Function Latest Ref Rng & Units 08/25/2021 05/30/2021 01/09/2021  Total Protein 6.5 - 8.1 g/dL 6.5 6.8 6.4  Albumin 3.5 - 5.0 g/dL 4.5 4.7 4.3  AST 15 - 41 U/L _0 ALT 0 - 44 U/L _1 Alk Phosphatase 38 - 126 U/L 58 61 61  Total Bilirubin 0.3 - 1.2 mg/dL 1.2 1.0 0.8  Bilirubin, Direct 0.0 - 0.3 mg/dL - - -    Lab Results  Component Value Date/Time   TSH 0.691 08/12/2021 02:21 PM   TSH 1.31 04/19/2017 04:25 PM    CBC Latest Ref Rng & Units 10/21/2021 08/25/2021 06/17/2021  WBC 4.0 - 10.5 K/uL  16.7(H) 20.9(H) 21.5(H)  Hemoglobin 12.0 - 15.0 g/dL 13.9 14.9 14.2  Hematocrit 36.0 - 46.0 % 43.7 45.6 43.8  Platelets 150 - 400 K/uL 109(L) 108(L) 142(L)    No results found for: VD25OH  Clinical ASCVD: No  The ASCVD Risk score (Arnett DK, et al., 2019) failed to calculate for the following reasons:   The 2019 ASCVD risk score is only valid for ages 55 to 52    Depression screen PHQ 2/9 06/20/2021 06/13/2021 11/29/2020  Decreased Interest 0 0 0  Down, Depressed, Hopeless 0 1 0  PHQ - 2 Score 0 1 0  Altered sleeping - - -  Tired, decreased energy - - -  Change in appetite - - -  Feeling bad or failure about yourself  - - -  Trouble  concentrating - - -  Moving slowly or fidgety/restless - - -  Suicidal thoughts - - -  PHQ-9 Score - - -  Difficult doing work/chores - - -     Social History   Tobacco Use  Smoking Status Former   Years: 20.00   Types: Cigarettes   Quit date: 1980   Years since quitting: 43.2  Smokeless Tobacco Never   BP Readings from Last 3 Encounters:  10/22/21 (!) 141/88  09/20/21 (!) 150/75  09/01/21 122/74   Pulse Readings from Last 3 Encounters:  10/22/21 91  09/20/21 90  09/01/21 63   Wt Readings from Last 3 Encounters:  10/21/21 145 lb (65.8 kg)  09/20/21 145 lb (65.8 kg)  09/01/21 150 lb (68 kg)   BMI Readings from Last 3 Encounters:  10/21/21 27.40 kg/m  09/20/21 27.40 kg/m  09/01/21 29.29 kg/m    Assessment/Interventions: Review of patient past medical history, allergies, medications, health status, including review of consultants reports, laboratory and other test data, was performed as part of comprehensive evaluation and provision of chronic care management services.   SDOH:  (Social Determinants of Health) assessments and interventions performed: Yes  Financial Resource Strain: Low Risk    Difficulty of Paying Living Expenses: Not hard at all    SDOH Screenings   Alcohol Screen: Not on file  Depression (PHQ2-9): Low Risk    PHQ-2 Score: 0  Financial Resource Strain: Low Risk    Difficulty of Paying Living Expenses: Not hard at all  Food Insecurity: No Food Insecurity   Worried About Charity fundraiser in the Last Year: Never true   Arboriculturist in the Last Year: Never true  Housing: Low Risk    Last Housing Risk Score: 0  Physical Activity: Insufficiently Active   Days of Exercise per Week: 1 day   Minutes of Exercise per Session: 20 min  Social Connections: Socially Isolated   Frequency of Communication with Friends and Family: Three times a week   Frequency of Social Gatherings with Friends and Family: Twice a week   Attends Religious Services:  Never   Marine scientist or Organizations: No   Attends Archivist Meetings: Never   Marital Status: Widowed  Stress: No Stress Concern Present   Feeling of Stress : Not at all  Tobacco Use: Medium Risk   Smoking Tobacco Use: Former   Smokeless Tobacco Use: Never   Passive Exposure: Not on file  Transportation Needs: No Transportation Needs   Lack of Transportation (Medical): No   Lack of Transportation (Non-Medical): No    CCM Care Plan  No Known Allergies  Medications Reviewed Today  Reviewed by Edythe Clarity, RPH (Pharmacist) on 11/25/21 at [phone]  Med List Status: <None>   Medication Order Taking? Sig Documenting Provider Last Dose Status Informant  amLODipine (NORVASC) 10 MG tablet [id number] Yes TAKE 1 TABLET BY MOUTH EVERY DAY  Patient taking differently: Take 10 mg by mouth daily.   [name], MD Taking Active Self  atenolol (TENORMIN) 100 MG tablet [id number] Yes TAKE 1 TABLET BY MOUTH EVERY DAY  Patient taking differently: Take 100 mg by mouth daily.   [name], MD Taking Active Self  ELIQUIS 5 MG TABS tablet [id number] Yes TAKE 1 TABLET BY MOUTH TWICE A DAY [name], MD Taking Active   hydrochlorothiazide (HYDRODIURIL) 25 MG tablet [id number] Yes TAKE 1 TABLET BY MOUTH EVERY DAY  Patient taking differently: Take 12.5 mg by mouth daily.   [name], MD Taking Active Self  losartan (COZAAR) 100 MG tablet [id number] Yes TAKE 1 TABLET BY MOUTH EVERY DAY  Patient taking differently: Take 100 mg by mouth daily.   [name], MD Taking Active Self  meclizine (ANTIVERT) 25 MG tablet [id number] Yes Take 1 tablet (25 mg total) by mouth 3 (three) times daily as needed for dizziness. Lennice Sites, DO Taking Active   Multiple Vitamins-Minerals (ONE-A-DAY WOMENS 50+ PO) [id number] Yes Take by mouth daily. [provider] Taking Active Self            Patient Active Problem List   Diagnosis Date Noted   H/O  rectocele repair 08/25/2021   Permanent atrial fibrillation ([place]) 04/24/2021   Secondary hypercoagulable state ([place]) 12/10/2020   Persistent atrial fibrillation ([place]) 11/29/2020   Hyperlipidemia, unspecified 04/07/2018   CKD (chronic kidney disease), stage III ([place]) 02/09/2018   Rash 02/09/2018   Seborrheic dermatitis 01/08/2017   Former smoker 09/30/2015   Rectocele 03/29/2015   Thrombocytopenia ([place]) 11/26/2014   CLL (chronic lymphocytic leukemia) ([place]) 11/27/2013   Hyperglycemia 05/08/2013   Essential hypertension, benign 11/07/2012   Obesity, unspecified 11/07/2012    Immunization History  Administered Date(s) Administered   Fluad Quad(high Dose 65+) 07/17/2021   Influenza Whole 06/30/2013   Influenza, High Dose Seasonal PF 06/25/2016, 06/21/2019   Influenza,inj,Quad PF,6+ Mos 06/13/2014   Influenza-Unspecified 07/12/2015, 06/09/2017, 06/21/2020   PFIZER Comirnaty(Gray Top)Covid-19 Tri-Sucrose Vaccine 01/30/2021   PFIZER(Purple Top)SARS-COV-2 Vaccination 12/13/2019, 12/30/2019, 07/12/2020   Pfizer Covid-19 Vaccine Bivalent Booster 61yr & up 07/17/2021   Pneumococcal Conjugate-13 06/25/2016   Pneumococcal Polysaccharide-23 06/21/2013   Td 04/07/2018    Conditions to be addressed/monitored:  HTN, HLD, Hyperglycemia  Care Plan : General Pharmacy (Adult)  Updates made by DEdythe Clarity RPH since 11/25/2021 12:00 AM     Problem: HTN, HLD, Afib   Priority: High  Onset Date: 05/12/2021     Long-Range Goal: Patient-Specific Goal   Start Date: 05/12/2021  Expected End Date: 11/12/2021  Recent Progress: On track  Priority: High  Note:   Current Barriers:  Ongoing dizziness  Pharmacist Clinical Goal(s):  Patient will maintain control of BP and Afib as evidenced by symptom level  contact provider office for questions/concerns as evidenced notation of same in electronic health record through collaboration with PharmD and provider.   Interventions: 1:1 collaboration  with HMarin Olp MD regarding development and update of comprehensive plan of care as evidenced by provider attestation and co-signature Inter-disciplinary care team collaboration (see longitudinal plan of care) Comprehensive medication review performed; medication list updated in electronic medical record  Hypertension (BP goal <130/80) -  Controlled -Current treatment: Amlodipine 60m daily Appropriate, Effective, Safe, Accessible Atenolol 1075mdaily Appropriate, Effective, Safe, Accessible HCTZ 2540maily Appropriate, Effective, Safe, Accessible Losartan 100m54mily Appropriate, Effective, Safe, Accessible -Medications previously tried: none noted  -Current home readings: 120/70s normally  -Denies hypotensive/hypertensive symptoms -Educated on BP goals and benefits of medications for prevention of heart attack, stroke and kidney damage; Importance of home blood pressure monitoring; Symptoms of hypotension and importance of maintaining adequate hydration; -Counseled to monitor BP at home weekly, document, and provide log at future appointments -Recommended to continue current medication  Update 11/25/21 Continue on meds above BP controlled at home - no specific readings today but normally around 120/80 per patient reports. Denies any recent dizziness She feels good considering her age. Continue to monitor at home - will have regular check ins on BP/dizziness.  Hyperlipidemia: (LDL goal < 100) -Not ideally controlled -Current treatment: None -Medications previously tried: none noted  -LDL is above goal, however due to age she is not on statin -Educated on Cholesterol goals;  Importance of limiting foods high in cholesterol; -Recommended continue current management strategies.  Atrial Fibrillation (Goal: prevent stroke and major bleeding) -Controlled -CHADSVASC: 4 -Current treatment: Rate control: Atenolol 100mg50mly Appropriate, Effective, Safe,  Accessible Anticoagulation: Eliquis 5mg B56mAppropriate, Effective, Safe, Accessible -Medications previously tried: none noted -Home BP and HR readings: well controlled  -Counseled on increased risk of stroke due to Afib and benefits of anticoagulation for stroke prevention; bleeding risk associated with Eliquis and importance of self-monitoring for signs/symptoms of bleeding; avoidance of NSAIDs due to increased bleeding risk with anticoagulants; -Recommended to continue current medication Assessed patient finances. Her copay is currently $40 which she states is manageable at this time for her.  Update 11/25/21 Continues to take Eliquis - mentions high copay when she enters the donut hole.  Discussed PAP program this year when this time comes.  Will have CMA follow up with her in a few months to see if she needs assistance.  She did have a fall recently where she was dizzy - this has not happened lately.  BP/HR have been normal. No changes needed at this time - monitor weight for dose decrease of Eliquis due to age.  Patient Goals/Self-Care Activities Patient will:  - take medications as prescribed check blood pressure periodically, document, and provide at future appointments  Follow Up Plan: The care management team will reach out to the patient again over the next 180 days.             Medication Assistance: None required.  Patient affirms current coverage meets needs.  Compliance/Adherence/Medication fill history: Care Gaps: None at this time  Star-Rating Drugs: Losartan 100mg 075m/22 90ds  Patient's preferred pharmacy is:   We discussed: Benefits of medication synchronization, packaging and delivery as well as enhanced pharmacist oversight with [facility]. Patient decided to: Continue current medication management strategy  Care Plan and Follow Up Patient Decision:  Patient agrees to Care Plan and Follow-up.  Plan: The care management team will reach out to the  patient again over the next 180 days.  ChristiBeverly MilchD Clinical Pharmacist [phone]

## 2021-11-24 ENCOUNTER — Ambulatory Visit (INDEPENDENT_AMBULATORY_CARE_PROVIDER_SITE_OTHER): Payer: PPO | Admitting: Pharmacist

## 2021-11-24 DIAGNOSIS — I4819 Other persistent atrial fibrillation: Secondary | ICD-10-CM

## 2021-11-24 DIAGNOSIS — I10 Essential (primary) hypertension: Secondary | ICD-10-CM

## 2021-11-25 NOTE — Patient Instructions (Addendum)
Visit Information ? ? Goals Addressed   ? ?  ?  ?  ?  ? This Visit's Progress  ?  Track and Manage My Blood Pressure-Hypertension   On track  ?  Timeframe:  Long-Range Goal ?Priority:  High ?Start Date: 05/12/21                            ?Expected End Date:  11/12/21                    ? ?Follow Up Date 08/20/21  ?  ?- check blood pressure weekly ?- choose a place to take my blood pressure (home, clinic or office, retail store) ?- write blood pressure results in a log or diary  ?  ?Why is this important?   ?You won't feel high blood pressure, but it can still hurt your blood vessels.  ?High blood pressure can cause heart or kidney problems. It can also cause a stroke.  ?Making lifestyle changes like losing a little weight or eating less salt will help.  ?Checking your blood pressure at home and at different times of the day can help to control blood pressure.  ?If the doctor prescribes medicine remember to take it the way the doctor ordered.  ?Call the office if you cannot afford the medicine or if there are questions about it.   ?  ?Notes:  ?  ? ?  ? ?Patient Care Plan: General Pharmacy (Adult)  ?  ? ?Problem Identified: HTN, HLD, Afib   ?Priority: High  ?Onset Date: 05/12/2021  ?  ? ?Long-Range Goal: Patient-Specific Goal   ?Start Date: 05/12/2021  ?Expected End Date: 11/12/2021  ?Recent Progress: On track  ?Priority: High  ?Note:   ?Current Barriers:  ?Ongoing dizziness ? ?Pharmacist Clinical Goal(s):  ?Patient will maintain control of BP and Afib as evidenced by symptom level  ?contact provider office for questions/concerns as evidenced notation of same in electronic health record through collaboration with PharmD and provider.  ? ?Interventions: ?1:1 collaboration with [name], MD regarding development and update of comprehensive plan of care as evidenced by provider attestation and co-signature ?Inter-disciplinary care team collaboration (see longitudinal plan of care) ?Comprehensive medication review  performed; medication list updated in electronic medical record ? ?Hypertension (BP goal <130/80) ?-Controlled ?-Current treatment: ?Amlodipine '10mg'$  daily Appropriate, Effective, Safe, Accessible ?Atenolol '100mg'$  daily Appropriate, Effective, Safe, Accessible ?HCTZ '25mg'$  daily Appropriate, Effective, Safe, Accessible ?Losartan '100mg'$  daily Appropriate, Effective, Safe, Accessible ?-Medications previously tried: none noted  ?-Current home readings: 120/70s normally ? ?-Denies hypotensive/hypertensive symptoms ?-Educated on BP goals and benefits of medications for prevention of heart attack, stroke and kidney damage; ?Importance of home blood pressure monitoring; ?Symptoms of hypotension and importance of maintaining adequate hydration; ?-Counseled to monitor BP at home weekly, document, and provide log at future appointments ?-Recommended to continue current medication ? ?Update 11/25/21 ?Continue on meds above ?BP controlled at home - no specific readings today but normally around 120/80 per patient reports. ?Denies any recent dizziness ?She feels good considering her age. ?Continue to monitor at home - will have regular check ins on BP/dizziness. ? ?Hyperlipidemia: (LDL goal < 100) ?-Not ideally controlled ?-Current treatment: ?None ?-Medications previously tried: none noted  ?-LDL is above goal, however due to age she is not on statin ?-Educated on Cholesterol goals;  ?Importance of limiting foods high in cholesterol; ?-Recommended continue current management strategies. ? ?Atrial Fibrillation (Goal: prevent stroke  and major bleeding) ?-Controlled ?-CHADSVASC: 4 ?-Current treatment: ?Rate control: Atenolol '100mg'$  daily Appropriate, Effective, Safe, Accessible ?Anticoagulation: Eliquis '5mg'$  BID Appropriate, Effective, Safe, Accessible ?-Medications previously tried: none noted ?-Home BP and HR readings: well controlled  ?-Counseled on increased risk of stroke due to Afib and benefits of anticoagulation for stroke  prevention; ?bleeding risk associated with Eliquis and importance of self-monitoring for signs/symptoms of bleeding; ?avoidance of NSAIDs due to increased bleeding risk with anticoagulants; ?-Recommended to continue current medication ?Assessed patient finances. Her copay is currently $40 which she states is manageable at this time for her. ? ?Update 11/25/21 ?Continues to take Eliquis - mentions high copay when she enters the donut hole.  Discussed PAP program this year when this time comes.  Will have CMA follow up with her in a few months to see if she needs assistance.  She did have a fall recently where she was dizzy - this has not happened lately.  BP/HR have been normal. ?No changes needed at this time - monitor weight for dose decrease of Eliquis due to age. ? ?Patient Goals/Self-Care Activities ?Patient will:  ?- take medications as prescribed ?check blood pressure periodically, document, and provide at future appointments ? ?Follow Up Plan: The care management team will reach out to the patient again over the next 180 days.  ? ? ?  ? ?  ?  ? ?The patient verbalized understanding of instructions, educational materials, and care plan provided today and declined offer to receive copy of patient instructions, educational materials, and care plan.  ?Telephone follow up appointment with pharmacy team member scheduled for: 6 months ? ?Edythe Clarity, [facility]  ?Beverly Milch, PharmD ?Clinical Pharmacist  ?[place] ?([phone] ? ?

## 2021-11-26 NOTE — Progress Notes (Incomplete)
Phone [phone] In person visit   Subjective:   Maria Lowe is a 88 y.o. year old very pleasant female patient who presents for/with See problem oriented charting No chief complaint on file.   This visit occurred during the SARS-CoV-2 public health emergency.  Safety protocols were in place, including screening questions prior to the visit, additional usage of staff PPE, and extensive cleaning of exam room while observing appropriate contact time as indicated for disinfecting solutions.   Past Medical History-  Patient Active Problem List   Diagnosis Date Noted   H/O rectocele repair 08/25/2021   Permanent atrial fibrillation ([place]) 04/24/2021   Secondary hypercoagulable state ([place]) 12/10/2020   Persistent atrial fibrillation ([place]) 11/29/2020   Hyperlipidemia, unspecified 04/07/2018   CKD (chronic kidney disease), stage III ([place]) 02/09/2018   Rash 02/09/2018   Seborrheic dermatitis 01/08/2017   Former smoker 09/30/2015   Rectocele 03/29/2015   Thrombocytopenia ([place]) 11/26/2014   CLL (chronic lymphocytic leukemia) ([place]) 11/27/2013   Hyperglycemia 05/08/2013   Essential hypertension, benign 11/07/2012   Obesity, unspecified 11/07/2012    Medications- reviewed and updated Current Outpatient Medications  Medication Sig Dispense Refill   amLODipine (NORVASC) 10 MG tablet TAKE 1 TABLET BY MOUTH EVERY DAY (Patient taking differently: Take 10 mg by mouth daily.) 90 tablet 3   atenolol (TENORMIN) 100 MG tablet TAKE 1 TABLET BY MOUTH EVERY DAY (Patient taking differently: Take 100 mg by mouth daily.) 90 tablet 3   ELIQUIS 5 MG TABS tablet TAKE 1 TABLET BY MOUTH TWICE A DAY 60 tablet 5   hydrochlorothiazide (HYDRODIURIL) 25 MG tablet TAKE 1 TABLET BY MOUTH EVERY DAY (Patient taking differently: Take 12.5 mg by mouth daily.) 90 tablet 3   losartan (COZAAR) 100 MG tablet TAKE 1 TABLET BY MOUTH EVERY DAY (Patient taking differently: Take 100 mg by mouth daily.) 90 tablet 3    meclizine (ANTIVERT) 25 MG tablet Take 1 tablet (25 mg total) by mouth 3 (three) times daily as needed for dizziness. 30 tablet 0   Multiple Vitamins-Minerals (ONE-A-DAY WOMENS 50+ PO) Take by mouth daily.     No current facility-administered medications for this visit.     Objective:  There were no vitals taken for this visit. Gen: NAD, resting comfortably CV: RRR no murmurs rubs or gallops Lungs: CTAB no crackles, wheeze, rhonchi Abdomen: soft/nontender/nondistended/normal bowel sounds. No rebound or guarding.  Ext: no edema Skin: warm, dry Neuro: grossly normal, moves all extremities  ***    Assessment and Plan    # ED F/U for dizziness S:Pt was seen on 10/11/2021 for an evaluation of an episode of dizziness and lightheadedness the day before. Pt reported she felt unsteady with walking and had a headache. Symptoms were resolved. She also included she would have ringing in the ears at times. Neurologically she was intact.Normal cerebellar signs.  Normal strength and sensation. Normal speech.No vision changes.  - EKG per my review shows atrial fibrillation rate controlled. No ischemic changes.She does have some small matter disease.  Appeared to have a remote lacunar infarct as well. -Meclizine 25 mg x3 daily.  A/P: ***     #hypertension S: medication: Amlodipine '10Mg'$ , Atenolol '100Mg'$ , HCTZ '25Mg'$ , losartan '100MG'$  Home readings #s: *** BP Readings from Last 3 Encounters:  10/22/21 (!) 141/88  09/20/21 (!) 150/75  09/01/21 122/74  A/P: ***  #Chronic kidney disease stage III S: GFR is typically in the 50s *** range -Patient knows to avoid NSAIDs***  A/P: ***   #  CLL-followed closely with Dr. Alvy Bimler and been stable. Also has thrombocytopenia related to CLL   #hyperlipidemia S: Medication:None. Patient remains off medication given over age 59 without history of heart attack stroke Lab Results  Component Value Date   CHOL 212 (H) 05/30/2020   HDL 69 05/30/2020   LDLCALC 113  (H) 05/30/2020   TRIG 189 (H) 05/30/2020   CHOLHDL 3.1 05/30/2020   A/P: ***  # Hyperglycemia/insulin resistance/prediabetes- a1c as high as 6 S: Medication: None Exercise and diet- *** Lab Results  Component Value Date   HGBA1C 5.9 11/29/2020   HGBA1C 5.5 05/30/2020   HGBA1C 5.8 11/23/2019    A/P: ***   Health Maintenance Due  Topic Date Due   Zoster Vaccines- Shingrix (1 of 2) Never done   Recommended follow up: No follow-ups on file. Future Appointments  Date Time Provider [place]  12/18/2021 10:40 AM [name], MD [facility]  06/02/2022  3:00 PM [facility] CCM PHARMACIST [facility]  06/18/2022  9:30 AM [facility] LAB [facility] None  06/18/2022 10:00 AM Heath Lark, MD [facility] None  07/03/2022  9:30 AM [facility] HEALTH COACH [facility]    Lab/Order associations: No diagnosis found.  No orders of the defined types were placed in this encounter.   I,Jada Bradford,acting as a scribe for [name], MD.,have documented all relevant documentation on the behalf of [name], MD,as directed by  [name], MD while in the presence of [name], MD.  *** Return precautions advised.  Burnett Corrente

## 2021-12-18 ENCOUNTER — Ambulatory Visit (INDEPENDENT_AMBULATORY_CARE_PROVIDER_SITE_OTHER): Payer: PPO | Admitting: Family Medicine

## 2021-12-18 ENCOUNTER — Encounter: Payer: Self-pay | Admitting: Family Medicine

## 2021-12-18 VITALS — BP 128/70 | HR 80 | Temp 97.9°F | Ht 61.0 in | Wt 140.0 lb

## 2021-12-18 DIAGNOSIS — D6869 Other thrombophilia: Secondary | ICD-10-CM

## 2021-12-18 DIAGNOSIS — L570 Actinic keratosis: Secondary | ICD-10-CM | POA: Diagnosis not present

## 2021-12-18 DIAGNOSIS — N183 Chronic kidney disease, stage 3 unspecified: Secondary | ICD-10-CM | POA: Diagnosis not present

## 2021-12-18 DIAGNOSIS — E785 Hyperlipidemia, unspecified: Secondary | ICD-10-CM

## 2021-12-18 DIAGNOSIS — C9110 Chronic lymphocytic leukemia of B-cell type not having achieved remission: Secondary | ICD-10-CM

## 2021-12-18 DIAGNOSIS — I4821 Permanent atrial fibrillation: Secondary | ICD-10-CM | POA: Diagnosis not present

## 2021-12-18 DIAGNOSIS — I10 Essential (primary) hypertension: Secondary | ICD-10-CM

## 2021-12-18 DIAGNOSIS — R739 Hyperglycemia, unspecified: Secondary | ICD-10-CM

## 2021-12-18 NOTE — Patient Instructions (Addendum)
Please schedule visit with coumadin [place] before you leave- please bring this sheet- this is to transition from eliquis to coumadin  ? ?Schedule a lab visit at the check out desk within 2 weeks. Return for future fasting labs meaning nothing but water after midnight please. Ok to take your medications with water.  ? ?Give handout for cryotherapy ? ?Recommended follow up: Return in about 6 months (around 06/20/2022) for physical or sooner if needed.Schedule b4 you leave. ?

## 2021-12-19 DIAGNOSIS — I4819 Other persistent atrial fibrillation: Secondary | ICD-10-CM

## 2021-12-19 DIAGNOSIS — I10 Essential (primary) hypertension: Secondary | ICD-10-CM | POA: Diagnosis not present

## 2021-12-25 ENCOUNTER — Other Ambulatory Visit (INDEPENDENT_AMBULATORY_CARE_PROVIDER_SITE_OTHER): Payer: PPO

## 2021-12-25 DIAGNOSIS — R739 Hyperglycemia, unspecified: Secondary | ICD-10-CM

## 2021-12-25 DIAGNOSIS — I10 Essential (primary) hypertension: Secondary | ICD-10-CM | POA: Diagnosis not present

## 2021-12-25 DIAGNOSIS — E785 Hyperlipidemia, unspecified: Secondary | ICD-10-CM | POA: Diagnosis not present

## 2021-12-25 LAB — CBC WITH DIFFERENTIAL/PLATELET
Basophils Absolute: 0 10*3/uL (ref 0.0–0.1)
Basophils Relative: 0.2 % (ref 0.0–3.0)
Eosinophils Absolute: 0.1 10*3/uL (ref 0.0–0.7)
Eosinophils Relative: 0.4 % (ref 0.0–5.0)
HCT: 41.7 % (ref 36.0–46.0)
Hemoglobin: 13.6 g/dL (ref 12.0–15.0)
Lymphocytes Relative: 73.8 % — ABNORMAL HIGH (ref 12.0–46.0)
Lymphs Abs: 12.1 10*3/uL — ABNORMAL HIGH (ref 0.7–4.0)
MCHC: 32.6 g/dL (ref 30.0–36.0)
MCV: 85.8 fl (ref 78.0–100.0)
Monocytes Absolute: 0.6 10*3/uL (ref 0.1–1.0)
Monocytes Relative: 3.6 % (ref 3.0–12.0)
Neutro Abs: 3.6 10*3/uL (ref 1.4–7.7)
Neutrophils Relative %: 22 % — ABNORMAL LOW (ref 43.0–77.0)
Platelets: 102 10*3/uL — ABNORMAL LOW (ref 150.0–400.0)
RBC: 4.86 Mil/uL (ref 3.87–5.11)
RDW: 16.2 % — ABNORMAL HIGH (ref 11.5–15.5)
WBC: 16.4 10*3/uL — ABNORMAL HIGH (ref 4.0–10.5)

## 2021-12-25 LAB — COMPREHENSIVE METABOLIC PANEL
ALT: 28 U/L (ref 0–35)
AST: 28 U/L (ref 0–37)
Albumin: 4.5 g/dL (ref 3.5–5.2)
Alkaline Phosphatase: 68 U/L (ref 39–117)
BUN: 31 mg/dL — ABNORMAL HIGH (ref 6–23)
CO2: 29 mEq/L (ref 19–32)
Calcium: 9.6 mg/dL (ref 8.4–10.5)
Chloride: 104 mEq/L (ref 96–112)
Creatinine, Ser: 0.93 mg/dL (ref 0.40–1.20)
GFR: 54.88 mL/min — ABNORMAL LOW (ref >60.00)
Glucose, Bld: 107 mg/dL — ABNORMAL HIGH (ref 70–99)
Potassium: 4.5 mEq/L (ref 3.5–5.1)
Sodium: 141 mEq/L (ref 135–145)
Total Bilirubin: 0.7 mg/dL (ref 0.2–1.2)
Total Protein: 6.2 g/dL (ref 6.0–8.3)

## 2021-12-25 LAB — LIPID PANEL
Cholesterol: 165 mg/dL (ref 0–200)
HDL: 65.2 mg/dL (ref >39.00)
LDL Cholesterol: 79 mg/dL (ref 0–99)
NonHDL: 99.99
Total CHOL/HDL Ratio: 3
Triglycerides: 103 mg/dL (ref 0.0–149.0)
VLDL: 20.6 mg/dL (ref 0.0–40.0)

## 2021-12-25 LAB — HEMOGLOBIN A1C: Hgb A1c MFr Bld: 6.2 % (ref 4.6–6.5)

## 2022-02-23 ENCOUNTER — Telehealth: Payer: Self-pay | Admitting: Family Medicine

## 2022-02-23 NOTE — Telephone Encounter (Signed)
From last note avs "Please schedule visit with coumadin [place] before you leave- please bring this sheet- this is to transition from eliquis to coumadin "

## 2022-02-23 NOTE — Telephone Encounter (Signed)
See below

## 2022-02-23 NOTE — Telephone Encounter (Signed)
Pt states blood thinner is too expensive. Pt requests a new script to be sent in for lower cost option.  Patient states this was discussed in a previous appointment with PCP.  Pt states she has enough of the current fill for this month (June '23)  Preferred pharmacy for script: [facility] [address], [place] [address]  Phone:  [phone]  Fax:  [phone]

## 2022-02-24 NOTE — Telephone Encounter (Signed)
See below and schedule pt with [facility] regarding this.

## 2022-02-24 NOTE — Telephone Encounter (Signed)
Contacted pt and advised of change to warfarin. Pt reports she is quite familiar with warfarin because her husband took it. She said she is fine taking it and is aware of all the interactions. Pt would like to finish the Eliquis prescription she paid over $500 for before starting the warfarin. She has enough Eliquis for the month of June. Advised pt this nurse will f/u with her later in the month so a prescription for warfarin can be sent and she can start it when advised. Advised pt there is a coumadin clinic at [facility] on Wednesday mornings and she could be seen there. She agreed with this and said she thinks her son could bring her.   Advised pt of the risk/benefits of taking warfarin. Educated pt about the signs and symptoms of abnormal bruising or bleeding and also the signs of a DVT/PE or stroke. Advised to go to ER if any signs or symptoms of stroke and she can also contact the office. Gave pt the number to the coumadin clinic. Pt verbalized understanding and was appreciative of the call.

## 2022-02-24 NOTE — Telephone Encounter (Signed)
I think the lovenox bridge may be tough for her honestly - I believe she has family that can help if needed though and especially since it would be short term  Thanks for taking such good care of her!

## 2022-03-10 ENCOUNTER — Other Ambulatory Visit: Payer: Self-pay | Admitting: Family Medicine

## 2022-03-11 NOTE — Telephone Encounter (Addendum)
Pt called to report she will be finished with Eliquis on 7/4. Advised a schedule will be created for her transition to warfarin and using a lovenox bridge. Pt said she thinks she can handle the lovenox injections. Inquired if she would like a call to one of her children to see if they could help with administration. She said no, she can handle the injections herself. Advised a schedule for lovenox bridge will be made and this office will f/u with her for further instructions. Pt appreciative and verbalized understanding.    Lovenox bridging schedule from Eliquis to warfarin. Pt will take last dose of Eliquis on 7/4. Pt will start warfarin and lovenox on 7/5. Pt will have first INR check on 7/12 at [facility]. Schedule below.  Will contact pt with dosing instructions next week after PCP review of schedule.  Wt: 63.45kg CrCl: 41.92 Lovenox: 100 mg, QD, need 7 syringes  7/4: Take last dose of Eliquis 7/5: Take 1 tablet (5 mg) warfarin, use one lovenox injection in the morning 7/6: Take 1 tablet (5 mg) warfarin, one lovenox injection in the morning 7/7: Take 1 tablet (5 mg) warfarin, one lovenox injection in the morning 7/8: Take 1 tablet (5 mg) warfarin, one lovenox injection in the morning 7/9: Take 1 tablet (5 mg) warfarin, one lovenox injection in the morning 7/10: Take 1 tablet (5 mg) warfarin, one lovenox injection in the morning 7/11: Take 1 tablet (5 mg) warfarin, one lovenox injection in the morning  7/12: NO warfarin and no lovenox until after INR is checked at [facility] appointment

## 2022-03-12 NOTE — Telephone Encounter (Signed)
See below

## 2022-03-12 NOTE — Telephone Encounter (Signed)
This looks perfect- thanks so much! You may send in lovenox rx when ready

## 2022-03-16 NOTE — Telephone Encounter (Signed)
Thank you so much for all of your hard work on this

## 2022-04-18 ENCOUNTER — Other Ambulatory Visit: Payer: Self-pay | Admitting: Family Medicine

## 2022-05-15 ENCOUNTER — Telehealth: Payer: Self-pay | Admitting: Pharmacist

## 2022-05-15 NOTE — Progress Notes (Unsigned)
Chronic Care Management Pharmacy Assistant   Name: Maria Lowe  MRN: [id number] DOB: 04-Jan-1931   Reason for Encounter: Hypertension Adherence Call    Recent office visits:  02/23/2022 TE (PCP) Patient wishes to remain on Eliquis vs bridge with Lovenox to Warfarin.  12/18/2021 OV (PCP) Maria Olp, MD;  appropriately anticoagulated and rate controlled but eliquis is cost prohibitive- discussed possible CCM pharmacy consult vs. Coumadin- shed prefer to transition to coumadin and advised to schedule appointment    Recent consult visits:  None since last CPP visit  Hospital visits:  None since last CPP visit  Medications: Outpatient Encounter Medications as of 05/15/2022  Medication Sig   amLODipine (NORVASC) 10 MG tablet TAKE 1 TABLET BY MOUTH EVERY DAY   atenolol (TENORMIN) 100 MG tablet TAKE 1 TABLET BY MOUTH EVERY DAY   hydrochlorothiazide (HYDRODIURIL) 25 MG tablet TAKE 1 TABLET BY MOUTH EVERY DAY   losartan (COZAAR) 100 MG tablet TAKE 1 TABLET BY MOUTH EVERY DAY   meclizine (ANTIVERT) 25 MG tablet Take 1 tablet (25 mg total) by mouth 3 (three) times daily as needed for dizziness.   Multiple Vitamins-Minerals (ONE-A-DAY WOMENS 50+ PO) Take by mouth daily.   [DISCONTINUED] ELIQUIS 5 MG TABS tablet TAKE 1 TABLET BY MOUTH TWICE A DAY   No facility-administered encounter medications on file as of 05/15/2022.   Reviewed chart prior to disease state call. Spoke with patient regarding BP  Recent Office Vitals: BP Readings from Last 3 Encounters:  12/18/21 128/70  10/22/21 (!) 141/88  09/20/21 (!) 150/75   Pulse Readings from Last 3 Encounters:  12/18/21 80  10/22/21 91  09/20/21 90    Wt Readings from Last 3 Encounters:  12/18/21 140 lb (63.5 kg)  10/21/21 145 lb (65.8 kg)  09/20/21 145 lb (65.8 kg)     Kidney Function Lab Results  Component Value Date/Time   CREATININE 0.93 12/25/2021 08:09 AM   CREATININE 0.98 10/21/2021 07:40 PM   CREATININE 0.89 (H)  05/30/2020 02:19 PM   CREATININE 1.0 11/28/2015 09:59 AM   CREATININE 0.8 11/27/2013 08:36 AM   GFR 54.88 (L) 12/25/2021 08:09 AM   GFRNONAA 56 (L) 10/21/2021 07:40 PM   GFRNONAA 59 (L) 05/30/2020 02:19 PM   GFRAA 68 05/30/2020 02:19 PM       Latest Ref Rng & Units 12/25/2021    8:09 AM 10/21/2021    7:40 PM 08/25/2021    9:22 AM  BMP  Glucose 70 - 99 mg/dL 107  124  136   BUN 6 - 23 mg/dL '31  18  25   '$ Creatinine 0.40 - 1.20 mg/dL 0.93  0.98  0.96   Sodium 135 - 145 mEq/L 141  139  138   Potassium 3.5 - 5.1 mEq/L 4.5  4.0  4.2   Chloride 96 - 112 mEq/L 104  102  107   CO2 19 - 32 mEq/L '29  24  25   '$ Calcium 8.4 - 10.5 mg/dL 9.6  9.4  9.3     Current antihypertensive regimen:  Amlodipine 10 mg daily Atenolol 100 mg daily HCTZ 25 mg daily Losartan 100 mg daily  How often are you checking your Blood Pressure? several times per month  Current home BP readings: 125/70  What recent interventions/DTPs have been made by any provider to improve Blood Pressure control since last CPP Visit: No recent interventions or DTPs.  Any recent hospitalizations or ED visits since last visit with CPP? No  What diet changes have been made to improve Blood Pressure Control?  Patient states she is not dieting and has not made any recent changes.  What exercise is being done to improve your Blood Pressure Control?  Patient states she tries to do things around her home and get out as much as she can.  Adherence Review: Is the patient currently on ACE/ARB medication? Yes Does the patient have >5 day gap between last estimated fill dates? No   Care Gaps: Medicare Annual Wellness: Completed 06/20/2021 Hemoglobin A1C: 6.2% on 12/25/2021 Colonoscopy: Aged out Dexa Scan: Postponed Mammogram: Aged out  Future Appointments  Date Time Provider [place]  06/02/2022  3:00 PM [facility] CCM PHARMACIST [facility]  06/18/2022  9:30 AM [facility] LAB [facility] None  06/18/2022 10:00 AM Heath Lark, MD [facility] None  06/25/2022  8:20 AM Maria Olp, MD [facility]  07/03/2022  9:30 AM [facility] HEALTH COACH [facility]   Star Rating Drugs: Losartan 100 mg last filled 03/10/2022 90 DS  April D Calhoun, [place] Pharmacist Assistant [phone]

## 2022-05-19 ENCOUNTER — Other Ambulatory Visit: Payer: Self-pay | Admitting: Family Medicine

## 2022-05-21 ENCOUNTER — Telehealth: Payer: Self-pay | Admitting: Pharmacist

## 2022-05-21 NOTE — Progress Notes (Signed)
    Chronic Care Management Pharmacy Assistant    Name: Maria Lowe  MRN: [id number] DOB: 02-08-1931   Reason for Encounter: Eliquis PAP    PAP form initiated for Eliquis. Will be mailed to patient to complete patient portion and return to prescribing MD office for final portion to be completed by MD and faxed in for patient for processing. Patient will update on the outcome of their application once received.   Jobe Gibbon, [facility] Clinical Pharmacist Assistant  [phone]

## 2022-05-27 NOTE — Progress Notes (Signed)
Chronic Care Management Pharmacy Note  06/02/2022 Name:  Maria Lowe MRN:  [id number] DOB:  [birth year]  Summary: Pharmd FU.  Patient has no concerns except for eliquis copay. Application in process will continue to follow.  FU 2 weeks on application status   Subjective: Maria Lowe is an 88 y.o. year old female who is a primary patient of Hunter, Brayton Mars, MD.  The CCM team was consulted for assistance with disease management and care coordination needs.    Engaged with patient by telephone for follow up visit in response to provider referral for pharmacy case management and/or care coordination services.   Consent to Services:  The patient was given the following information about Chronic Care Management services today, agreed to services, and gave verbal consent: 1. CCM service includes personalized support from designated clinical staff supervised by the primary care provider, including individualized plan of care and coordination with other care providers 2. 24/7 contact phone numbers for assistance for urgent and routine care needs. 3. Service will only be billed when office clinical staff spend 20 minutes or more in a month to coordinate care. 4. Only one practitioner may furnish and bill the service in a calendar month. 5.The patient may stop CCM services at any time (effective at the end of the month) by phone call to the office staff. 6. The patient will be responsible for cost sharing (co-pay) of up to 20% of the service fee (after annual deductible is met). Patient agreed to services and consent obtained.  Patient Care Team: [name], MD as PCP - General (Family Medicine) Heath Lark, MD as Consulting Physician (Hematology and Oncology) Edythe Clarity, [facility] (Pharmacist)  Recent office visits:  02/23/2022 TE (PCP) Patient wishes to remain on Eliquis vs bridge with Lovenox to Warfarin.   12/18/2021 OV (PCP) [name], MD;  appropriately anticoagulated  and rate controlled but eliquis is cost prohibitive- discussed possible CCM pharmacy consult vs. Coumadin- shed prefer to transition to coumadin and advised to schedule appointment     Recent consult visits:  None since last CPP visit   Hospital visits:  None since last CPP visit   Objective:  Lab Results  Component Value Date   CREATININE 0.93 12/25/2021   BUN 31 (H) 12/25/2021   GFR 54.88 (L) 12/25/2021   GFRNONAA 56 (L) 10/21/2021   GFRAA 68 05/30/2020   NA 141 12/25/2021   K 4.5 12/25/2021   CALCIUM 9.6 12/25/2021   CO2 29 12/25/2021   GLUCOSE 107 (H) 12/25/2021    Lab Results  Component Value Date/Time   HGBA1C 6.2 12/25/2021 08:09 AM   HGBA1C 5.9 11/29/2020 10:23 AM   GFR 54.88 (L) 12/25/2021 08:09 AM   GFR 50.64 (L) 01/09/2021 09:33 AM    Last diabetic Eye exam: No results found for: "HMDIABEYEEXA"  Last diabetic Foot exam: No results found for: "HMDIABFOOTEX"   Lab Results  Component Value Date   CHOL 165 12/25/2021   HDL 65.20 12/25/2021   LDLCALC 79 12/25/2021   TRIG 103.0 12/25/2021   CHOLHDL 3 12/25/2021       Latest Ref Rng & Units 12/25/2021    8:09 AM 08/25/2021    9:22 AM 05/30/2021   10:36 AM  Hepatic Function  Total Protein 6.0 - 8.3 g/dL 6.2  6.5  6.8   Albumin 3.5 - 5.2 g/dL 4.5  4.5  4.7   AST 0 - 37 U/L 28  25  20  ALT 0 - 35 U/L '28  21  18   ' Alk Phosphatase 39 - 117 U/L 68  58  61   Total Bilirubin 0.2 - 1.2 mg/dL 0.7  1.2  1.0     Lab Results  Component Value Date/Time   TSH 0.691 08/12/2021 02:21 PM   TSH 1.31 04/19/2017 04:25 PM       Latest Ref Rng & Units 12/25/2021    8:09 AM 10/21/2021    7:40 PM 08/25/2021    9:22 AM  CBC  WBC 4.0 - 10.5 K/uL 16.4  16.7  20.9   Hemoglobin 12.0 - 15.0 g/dL 13.6  13.9  14.9   Hematocrit 36.0 - 46.0 % 41.7  43.7  45.6   Platelets 150.0 - 400.0 K/uL 102.0  109  108     No results found for: "VD25OH"  Clinical ASCVD: No  The ASCVD Risk score (Arnett DK, et al., 2019) failed to calculate  for the following reasons:   The 2019 ASCVD risk score is only valid for ages 15 to 77       06/20/2021    9:38 AM 06/13/2021    9:15 AM 11/29/2020    9:38 AM  Depression screen PHQ 2/9  Decreased Interest 0 0 0  Down, Depressed, Hopeless 0 1 0  PHQ - 2 Score 0 1 0     Social History   Tobacco Use  Smoking Status Former   Years: 20.00   Types: Cigarettes   Quit date: 1980   Years since quitting: 43.7  Smokeless Tobacco Never   BP Readings from Last 3 Encounters:  12/18/21 128/70  10/22/21 (!) 141/88  09/20/21 (!) 150/75   Pulse Readings from Last 3 Encounters:  12/18/21 80  10/22/21 91  09/20/21 90   Wt Readings from Last 3 Encounters:  12/18/21 140 lb (63.5 kg)  10/21/21 145 lb (65.8 kg)  09/20/21 145 lb (65.8 kg)   BMI Readings from Last 3 Encounters:  12/18/21 26.45 kg/m  10/21/21 27.40 kg/m  09/20/21 27.40 kg/m    Assessment/Interventions: Review of patient past medical history, allergies, medications, health status, including review of consultants reports, laboratory and other test data, was performed as part of comprehensive evaluation and provision of chronic care management services.   SDOH:  (Social Determinants of Health) assessments and interventions performed: No, done within the last year  Financial Resource Strain: Low Risk  (06/20/2021)   Overall Financial Resource Strain (CARDIA)    Difficulty of Paying Living Expenses: Not hard at all    [place]: No Food Insecurity (06/20/2021)  Housing: Low Risk  (06/20/2021)  Transportation Needs: No Transportation Needs (06/20/2021)  Depression (PHQ2-9): Low Risk  (06/20/2021)  Financial Resource Strain: Low Risk  (06/20/2021)  Physical Activity: Insufficiently Active (06/20/2021)  Social Connections: Socially Isolated (06/20/2021)  Stress: No Stress Concern Present (06/20/2021)  Tobacco Use: Medium Risk (12/18/2021)    [place]  No Known Allergies  Medications Reviewed  Today     Reviewed by Edythe Clarity, [facility] (Pharmacist) on 06/02/22 at 0939  Med List Status: <None>   Medication Order Taking? Sig Documenting Provider Last Dose Status Informant  amLODipine (NORVASC) 10 MG tablet [id number] Yes TAKE 1 TABLET BY MOUTH EVERY DAY [name], MD Taking Active   atenolol (TENORMIN) 100 MG tablet [id number] Yes TAKE 1 TABLET BY MOUTH EVERY DAY [name], MD Taking Active   ELIQUIS 5 MG TABS tablet [id number]  Yes TAKE 1 TABLET BY MOUTH TWICE A DAY [name], MD Taking Active   hydrochlorothiazide (HYDRODIURIL) 25 MG tablet [id number] Yes TAKE 1 TABLET BY MOUTH EVERY DAY [name], MD Taking Active   losartan (COZAAR) 100 MG tablet [id number] Yes TAKE 1 TABLET BY MOUTH EVERY DAY [name], MD Taking Active   meclizine (ANTIVERT) 25 MG tablet [id number] Yes Take 1 tablet (25 mg total) by mouth 3 (three) times daily as needed for dizziness. Lennice Sites, DO Taking Active   Multiple Vitamins-Minerals (ONE-A-DAY WOMENS 50+ PO) [id number] Yes Take by mouth daily. [provider] Taking Active Self            Patient Active Problem List   Diagnosis Date Noted   H/O rectocele repair 08/25/2021   Secondary hypercoagulable state ([place]) 12/10/2020   Persistent atrial fibrillation ([place]) 11/29/2020   Hyperlipidemia, unspecified 04/07/2018   CKD (chronic kidney disease), stage III ([place]) 02/09/2018   Rash 02/09/2018   Seborrheic dermatitis 01/08/2017   Former smoker 09/30/2015   Rectocele 03/29/2015   Thrombocytopenia ([place]) 11/26/2014   CLL (chronic lymphocytic leukemia) ([place]) 11/27/2013   Hyperglycemia 05/08/2013   Essential hypertension, benign 11/07/2012   Obesity, unspecified 11/07/2012    Immunization History  Administered Date(s) Administered   Fluad Quad(high Dose 65+) 07/17/2021   Influenza Whole 06/30/2013   Influenza, High Dose Seasonal PF 06/25/2016, 06/21/2019   Influenza,inj,Quad PF,6+ Mos 06/13/2014    Influenza-Unspecified 07/12/2015, 06/09/2017, 06/21/2020   PFIZER Comirnaty(Gray Top)Covid-19 Tri-Sucrose Vaccine 01/30/2021   PFIZER(Purple Top)SARS-COV-2 Vaccination 12/13/2019, 12/30/2019, 07/12/2020   Pfizer Covid-19 Vaccine Bivalent Booster 35yr & up 07/17/2021   Pneumococcal Conjugate-13 06/25/2016   Pneumococcal Polysaccharide-23 06/21/2013   Td 04/07/2018    Conditions to be addressed/monitored:  HTN, HLD, Hyperglycemia  Care Plan : General Pharmacy (Adult)  Updates made by DEdythe Clarity RPH since 06/02/2022 12:00 AM     Problem: HTN, HLD, Afib   Priority: High  Onset Date: 05/12/2021     Long-Range Goal: Patient-Specific Goal   Start Date: 05/12/2021  Expected End Date: 11/12/2021  Recent Progress: On track  Priority: High  Note:   Current Barriers:  Eliquis copay  Pharmacist Clinical Goal(s):  Patient will maintain control of BP and Afib as evidenced by symptom level  contact provider office for questions/concerns as evidenced notation of same in electronic health record through collaboration with PharmD and provider.   Interventions: 1:1 collaboration with HMarin Olp MD regarding development and update of comprehensive plan of care as evidenced by provider attestation and co-signature Inter-disciplinary care team collaboration (see longitudinal plan of care) Comprehensive medication review performed; medication list updated in electronic medical record  Hypertension (BP goal <130/80) 06/02/22 -Controlled -Current treatment: Amlodipine 130mdaily Appropriate, Effective, Safe, Accessible Atenolol 10045maily Appropriate, Effective, Safe, Accessible HCTZ 76m71mily Appropriate, Effective, Safe, Accessible Losartan 100mg98mly Appropriate, Effective, Safe, Accessible -Medications previously tried: none noted  -Current home readings: no logs discussed at this time -Denies hypotensive/hypertensive symptoms -Educated on BP goals and benefits of  medications for prevention of heart attack, stroke and kidney damage; Importance of home blood pressure monitoring; Symptoms of hypotension and importance of maintaining adequate hydration; -Denies any dizziness or HA.   -Adherent with medications - she reports no complaints at this time.  Main concern is affordability of Eliquis at this time.  Update 11/25/21 Continue on meds above BP controlled at home - no specific readings today but normally around 120/80 per patient reports. Denies  any recent dizziness She feels good considering her age. Continue to monitor at home - will have regular check ins on BP/dizziness.  Hyperlipidemia: (LDL goal < 100) -Not ideally controlled -Current treatment: None -Medications previously tried: none noted  -LDL is above goal, however due to age she is not on statin -Educated on Cholesterol goals;  Importance of limiting foods high in cholesterol; -Recommended continue current management strategies.  Atrial Fibrillation (Goal: prevent stroke and major bleeding) 06/02/22 -Controlled -CHADSVASC: 4 -Current treatment: Rate control: Atenolol 113m daily Appropriate, Effective, Safe, Accessible Anticoagulation: Eliquis 53mBID Appropriate, Effective, Safe, Accessible -Medications previously tried: none noted -Home BP and HR readings: well controlled  -Counseled on increased risk of stroke due to Afib and benefits of anticoagulation for stroke prevention; bleeding risk associated with Eliquis and importance of self-monitoring for signs/symptoms of bleeding; avoidance of NSAIDs due to increased bleeding risk with anticoagulants; -Recommended to continue current medication -Currently her copay is over $100 for 30 days supply.  She has been sent an application for Eliquis PAP, reports she has not received it yet.  Will FU on application status, she opted not to switch to warfarin at this time. Continue to assist where needed with copay - no changes to  meds.  Update 11/25/21 Continues to take Eliquis - mentions high copay when she enters the donut hole.  Discussed PAP program this year when this time comes.  Will have CMA follow up with her in a few months to see if she needs assistance.  She did have a fall recently where she was dizzy - this has not happened lately.  BP/HR have been normal. No changes needed at this time - monitor weight for dose decrease of Eliquis due to age.  Patient Goals/Self-Care Activities Patient will:  - take medications as prescribed check blood pressure periodically, document, and provide at future appointments  Follow Up Plan: The care management team will reach out to the patient again over the next 180 days.                 Medication Assistance: None required.  Patient affirms current coverage meets needs.  Compliance/Adherence/Medication fill history: Care Gaps: None at this time  Star-Rating Drugs: Losartan 10070m8/27/23 90ds  Patient's preferred pharmacy is:   We discussed: Benefits of medication synchronization, packaging and delivery as well as enhanced pharmacist oversight with [facility]. Patient decided to: Continue current medication management strategy  Care Plan and Follow Up Patient Decision:  Patient agrees to Care Plan and Follow-up.  Plan: The care management team will reach out to the patient again over the next 180 days.  ChrBeverly MilchharmD Clinical Pharmacist [phone]

## 2022-06-02 ENCOUNTER — Ambulatory Visit (INDEPENDENT_AMBULATORY_CARE_PROVIDER_SITE_OTHER): Payer: PPO | Admitting: Pharmacist

## 2022-06-02 DIAGNOSIS — I10 Essential (primary) hypertension: Secondary | ICD-10-CM

## 2022-06-02 DIAGNOSIS — I4819 Other persistent atrial fibrillation: Secondary | ICD-10-CM

## 2022-06-02 NOTE — Patient Instructions (Addendum)
Visit Information   Goals Addressed             This Visit's Progress    Track and Manage My Blood Pressure-Hypertension   On track    Timeframe:  Long-Range Goal Priority:  High Start Date: 05/12/21                            Expected End Date:  11/12/21                     Follow Up Date 08/20/21    - check blood pressure weekly - choose a place to take my blood pressure (home, clinic or office, retail store) - write blood pressure results in a log or diary    Why is this important?   You won't feel high blood pressure, but it can still hurt your blood vessels.  High blood pressure can cause heart or kidney problems. It can also cause a stroke.  Making lifestyle changes like losing a little weight or eating less salt will help.  Checking your blood pressure at home and at different times of the day can help to control blood pressure.  If the doctor prescribes medicine remember to take it the way the doctor ordered.  Call the office if you cannot afford the medicine or if there are questions about it.     Notes:        Patient Care Plan: General Pharmacy (Adult)     Problem Identified: HTN, HLD, Afib   Priority: High  Onset Date: 05/12/2021     Long-Range Goal: Patient-Specific Goal   Start Date: 05/12/2021  Expected End Date: 11/12/2021  Recent Progress: On track  Priority: High  Note:   Current Barriers:  Eliquis copay  Pharmacist Clinical Goal(s):  Patient will maintain control of BP and Afib as evidenced by symptom level  contact provider office for questions/concerns as evidenced notation of same in electronic health record through collaboration with PharmD and provider.   Interventions: 1:1 collaboration with [name], MD regarding development and update of comprehensive plan of care as evidenced by provider attestation and co-signature Inter-disciplinary care team collaboration (see longitudinal plan of care) Comprehensive medication review  performed; medication list updated in electronic medical record  Hypertension (BP goal <130/80) 06/02/22 -Controlled -Current treatment: Amlodipine '10mg'$  daily Appropriate, Effective, Safe, Accessible Atenolol '100mg'$  daily Appropriate, Effective, Safe, Accessible HCTZ '25mg'$  daily Appropriate, Effective, Safe, Accessible Losartan '100mg'$  daily Appropriate, Effective, Safe, Accessible -Medications previously tried: none noted  -Current home readings: no logs discussed at this time -Denies hypotensive/hypertensive symptoms -Educated on BP goals and benefits of medications for prevention of heart attack, stroke and kidney damage; Importance of home blood pressure monitoring; Symptoms of hypotension and importance of maintaining adequate hydration; -Denies any dizziness or HA.   -Adherent with medications - she reports no complaints at this time.  Main concern is affordability of Eliquis at this time.  Update 11/25/21 Continue on meds above BP controlled at home - no specific readings today but normally around 120/80 per patient reports. Denies any recent dizziness She feels good considering her age. Continue to monitor at home - will have regular check ins on BP/dizziness.  Hyperlipidemia: (LDL goal < 100) -Not ideally controlled -Current treatment: None -Medications previously tried: none noted  -LDL is above goal, however due to age she is not on statin -Educated on Cholesterol goals;  Importance of limiting foods high  in cholesterol; -Recommended continue current management strategies.  Atrial Fibrillation (Goal: prevent stroke and major bleeding) 06/02/22 -Controlled -CHADSVASC: 4 -Current treatment: Rate control: Atenolol '100mg'$  daily Appropriate, Effective, Safe, Accessible Anticoagulation: Eliquis '5mg'$  BID Appropriate, Effective, Safe, Accessible -Medications previously tried: none noted -Home BP and HR readings: well controlled  -Counseled on increased risk of stroke due to  Afib and benefits of anticoagulation for stroke prevention; bleeding risk associated with Eliquis and importance of self-monitoring for signs/symptoms of bleeding; avoidance of NSAIDs due to increased bleeding risk with anticoagulants; -Recommended to continue current medication -Currently her copay is over $100 for 30 days supply.  She has been sent an application for Eliquis PAP, reports she has not received it yet.  Will FU on application status, she opted not to switch to warfarin at this time. Continue to assist where needed with copay - no changes to meds.  Update 11/25/21 Continues to take Eliquis - mentions high copay when she enters the donut hole.  Discussed PAP program this year when this time comes.  Will have CMA follow up with her in a few months to see if she needs assistance.  She did have a fall recently where she was dizzy - this has not happened lately.  BP/HR have been normal. No changes needed at this time - monitor weight for dose decrease of Eliquis due to age.  Patient Goals/Self-Care Activities Patient will:  - take medications as prescribed check blood pressure periodically, document, and provide at future appointments  Follow Up Plan: The care management team will reach out to the patient again over the next 180 days.               The patient verbalized understanding of instructions, educational materials, and care plan provided today and DECLINED offer to receive copy of patient instructions, educational materials, and care plan.  Telephone follow up appointment with pharmacy team member scheduled for: 6 month   Edythe Clarity, [place], PharmD Clinical Pharmacist  [facility] [phone]

## 2022-06-18 ENCOUNTER — Ambulatory Visit: Payer: PPO | Admitting: Hematology and Oncology

## 2022-06-18 ENCOUNTER — Other Ambulatory Visit: Payer: PPO

## 2022-06-20 DIAGNOSIS — I10 Essential (primary) hypertension: Secondary | ICD-10-CM

## 2022-06-20 DIAGNOSIS — I4891 Unspecified atrial fibrillation: Secondary | ICD-10-CM

## 2022-06-25 ENCOUNTER — Telehealth: Payer: Self-pay

## 2022-06-25 ENCOUNTER — Encounter: Payer: Self-pay | Admitting: Family Medicine

## 2022-06-25 ENCOUNTER — Ambulatory Visit (INDEPENDENT_AMBULATORY_CARE_PROVIDER_SITE_OTHER): Payer: PPO | Admitting: Family Medicine

## 2022-06-25 VITALS — BP 126/71 | HR 71 | Temp 98.0°F | Ht 61.0 in | Wt 148.2 lb

## 2022-06-25 DIAGNOSIS — R739 Hyperglycemia, unspecified: Secondary | ICD-10-CM | POA: Diagnosis not present

## 2022-06-25 DIAGNOSIS — M5442 Lumbago with sciatica, left side: Secondary | ICD-10-CM

## 2022-06-25 DIAGNOSIS — Z0001 Encounter for general adult medical examination with abnormal findings: Secondary | ICD-10-CM

## 2022-06-25 DIAGNOSIS — E785 Hyperlipidemia, unspecified: Secondary | ICD-10-CM

## 2022-06-25 DIAGNOSIS — M79605 Pain in left leg: Secondary | ICD-10-CM

## 2022-06-25 LAB — CBC WITH DIFFERENTIAL/PLATELET
Basophils Absolute: 0 10*3/uL (ref 0.0–0.1)
Basophils Relative: 0.2 % (ref 0.0–3.0)
Eosinophils Absolute: 0.1 10*3/uL (ref 0.0–0.7)
Eosinophils Relative: 0.6 % (ref 0.0–5.0)
HCT: 41.3 % (ref 36.0–46.0)
Hemoglobin: 13.3 g/dL (ref 12.0–15.0)
Lymphocytes Relative: 69.6 % — ABNORMAL HIGH (ref 12.0–46.0)
Lymphs Abs: 13.1 10*3/uL — ABNORMAL HIGH (ref 0.7–4.0)
MCHC: 32.2 g/dL (ref 30.0–36.0)
MCV: 87.9 fl (ref 78.0–100.0)
Monocytes Absolute: 0.5 10*3/uL (ref 0.1–1.0)
Monocytes Relative: 2.9 % — ABNORMAL LOW (ref 3.0–12.0)
Neutro Abs: 5 10*3/uL (ref 1.4–7.7)
Neutrophils Relative %: 26.7 % — ABNORMAL LOW (ref 43.0–77.0)
Platelets: 102 10*3/uL — ABNORMAL LOW (ref 150.0–400.0)
RBC: 4.69 Mil/uL (ref 3.87–5.11)
RDW: 15.2 % (ref 11.5–15.5)
WBC: 18.7 10*3/uL (ref 4.0–10.5)

## 2022-06-25 LAB — COMPREHENSIVE METABOLIC PANEL
ALT: 27 U/L (ref 0–35)
AST: 29 U/L (ref 0–37)
Albumin: 4.7 g/dL (ref 3.5–5.2)
Alkaline Phosphatase: 85 U/L (ref 39–117)
BUN: 25 mg/dL — ABNORMAL HIGH (ref 6–23)
CO2: 31 mEq/L (ref 19–32)
Calcium: 9.7 mg/dL (ref 8.4–10.5)
Chloride: 101 mEq/L (ref 96–112)
Creatinine, Ser: 0.93 mg/dL (ref 0.40–1.20)
GFR: 54.68 mL/min — ABNORMAL LOW (ref >60.00)
Glucose, Bld: 133 mg/dL — ABNORMAL HIGH (ref 70–99)
Potassium: 4.4 mEq/L (ref 3.5–5.1)
Sodium: 139 mEq/L (ref 135–145)
Total Bilirubin: 1.2 mg/dL (ref 0.2–1.2)
Total Protein: 6.6 g/dL (ref 6.0–8.3)

## 2022-06-25 LAB — HEMOGLOBIN A1C: Hgb A1c MFr Bld: 6.1 % (ref 4.6–6.5)

## 2022-06-25 LAB — LDL CHOLESTEROL, DIRECT: Direct LDL: 81 mg/dL

## 2022-06-25 MED ORDER — ROSUVASTATIN CALCIUM 5 MG PO TABS: 5.0000 mg | ORAL_TABLET | ORAL | 3 refills | Status: DC

## 2022-06-25 MED ORDER — PREDNISONE 20 MG PO TABS: ORAL_TABLET | ORAL | 0 refills | Status: DC

## 2022-06-25 NOTE — Progress Notes (Signed)
Phone [phone]   Subjective:  Patient presents today for their annual physical. Chief complaint-noted.   See problem oriented charting- ROS- full  review of systems was completed and negative except for: back pain, leg pain, myalgias, bruises easily  The following were reviewed and entered/updated in epic: Past Medical History:  Diagnosis Date   Anticoagulant long-term use    eliquis-- managed by cardiology   Arthritis    knees   CKD (chronic kidney disease), stage III ([place])    CLL (chronic lymphocytic leukemia) ([place]) 11/27/2013   oncologist-- dr Alvy Bimler;  initial dx 03/ 2015, no treatment, active survillance   Essential hypertension    followed by pcp   Full dentures    History of gastric ulcer    stomach ulcer when young   Lymphocytosis 2009   followed oncology   Permanent atrial fibrillation ([place]) 11/2020   cardiologist-- primary dr w. Audie Box and followed by atrial clinic;    dx 003/ 2022, atenolol for rate control and on eliquis   Rectocele    Thrombocythemia 2009   related to CLL, followed by dr Alvy Bimler   Urinary incontinence    Vaginal atrophy    Patient Active Problem List   Diagnosis Date Noted   Persistent atrial fibrillation ([place]) 11/29/2020    Priority: High   Thrombocytopenia ([place]) 11/26/2014    Priority: High   CLL (chronic lymphocytic leukemia) ([place]) 11/27/2013    Priority: High   Hyperlipidemia, unspecified 04/07/2018    Priority: Medium    CKD (chronic kidney disease), stage III ([place]) 02/09/2018    Priority: Medium    Hyperglycemia 05/08/2013    Priority: Medium    Essential hypertension, benign 11/07/2012    Priority: Medium    H/O rectocele repair 08/25/2021    Priority: Low   Secondary hypercoagulable state ([place]) 12/10/2020    Priority: Low   Rash 02/09/2018    Priority: Low   Seborrheic dermatitis 01/08/2017    Priority: Low   Former smoker 09/30/2015    Priority: Low   Rectocele 03/29/2015    Priority: Low   Obesity, unspecified  11/07/2012    Priority: Low   Acute left-sided low back pain with left-sided sciatica 06/25/2022   Past Surgical History:  Procedure Laterality Date   APPENDECTOMY     1980s   CATARACT EXTRACTION W/ INTRAOCULAR LENS IMPLANT Bilateral 2020   RECTOCELE REPAIR N/A 08/25/2021   Procedure: POSTERIOR REPAIR (RECTOCELE);  Surgeon: Salvadore Dom, MD;  Location: [facility];  Service: Gynecology;  Laterality: N/A;   TONSILLECTOMY AND ADENOIDECTOMY     age 59    Family History  Problem Relation Age of Onset   Healthy Mother        died age 20 (communist country could not get records)   Other Father        died young-unclear cause   Other Sister        died in 47 "old age. hard to get answers in [place]   Other Sister        59,  hard to get answers in [place]- possible cancer- rectal or colorectal?   Other Sister        died at 56- unclear cause    Medications- reviewed and updated Current Outpatient Medications  Medication Sig Dispense Refill   amLODipine (NORVASC) 10 MG tablet TAKE 1 TABLET BY MOUTH EVERY DAY 90 tablet 3   atenolol (TENORMIN) 100 MG tablet TAKE 1 TABLET BY MOUTH EVERY DAY  90 tablet 3   ELIQUIS 5 MG TABS tablet TAKE 1 TABLET BY MOUTH TWICE A DAY 60 tablet 5   hydrochlorothiazide (HYDRODIURIL) 25 MG tablet TAKE 1 TABLET BY MOUTH EVERY DAY 90 tablet 3   losartan (COZAAR) 100 MG tablet TAKE 1 TABLET BY MOUTH EVERY DAY 90 tablet 3   meclizine (ANTIVERT) 25 MG tablet Take 1 tablet (25 mg total) by mouth 3 (three) times daily as needed for dizziness. 30 tablet 0   Multiple Vitamins-Minerals (ONE-A-DAY WOMENS 50+ PO) Take by mouth daily.     predniSONE (DELTASONE) 20 MG tablet Take 2 pills for 3 days, 1 pill for 4 days 10 tablet 0   rosuvastatin (CRESTOR) 5 MG tablet Take 1 tablet (5 mg total) by mouth once a week. 13 tablet 3   No current facility-administered medications for this visit.    Allergies-reviewed and updated No Known Allergies  Social  History   Social History Narrative   Lives alone- cooks Sunday dinner, cares for home and in good shape. Widowed April 2016. 3 children. 7 grandkids. 3 greatgrandkids.   Husband worked as Chief Executive Officer.       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 Oropharynx normal Neck: no thyromegaly CV: irregularly irregular no murmurs rubs or gallops Lungs: CTAB no crackles, wheeze, rhonchi Abdomen: soft/nontender/nondistended/normal bowel sounds. No rebound or guarding.  Ext: 1+ edema Skin: warm, dry Neuro: grossly normal, moves all extremities, PERRLA See separate note about back /leg pain iusses   Assessment and Plan   88 y.o. female presenting for annual physical.  Health Maintenance counseling: 1. Anticipatory guidance: Patient counseled regarding regular dental exams - wears dentures, eye exams - no issues with vision- could still get updated eye exam,  avoiding smoking and second hand smoke , limiting alcohol to 1 beverage per day- doesn't drink , no illicit drugs .   2. Risk factor reduction:  Advised patient of need for regular exercise and diet rich and fruits and vegetables to reduce risk of heart attack and stroke.  Exercise- doing some walking but more limited due to left leg pain.  Diet/weight management-down 3 lbs over last year- tries to eat reasonably health- limits processed foods. Smaller plate size/lower appetite in general - still cooks Sunday dinner Wt Readings from Last 3 Encounters:  06/25/22 148 lb 3.2 oz (67.2 kg)  12/18/21 140 lb (63.5 kg)  10/21/21 145 lb (65.8 kg)  3. Immunizations/screenings/ancillary studies-recommended Shingrix (does not recall history  of chicken pox) and COVID vaccination at pharmacy  Immunization History  Administered Date(s) Administered   Fluad Quad(high Dose 65+) 07/17/2021   Influenza Whole 06/30/2013   Influenza, High Dose Seasonal PF 06/25/2016, 06/21/2019, 06/18/2022   Influenza,inj,Quad PF,6+ Mos 06/13/2014   Influenza-Unspecified 07/12/2015, 06/09/2017, 06/21/2020   PFIZER Comirnaty(Gray Top)Covid-19 Tri-Sucrose Vaccine 01/30/2021   PFIZER(Purple Top)SARS-COV-2 Vaccination 12/13/2019, 12/30/2019, 07/12/2020   Pfizer Covid-19 Vaccine Bivalent Booster 43yr & up 07/17/2021   Pneumococcal Conjugate-13 06/25/2016   Pneumococcal Polysaccharide-23 06/21/2013   Td 04/07/2018  4. Cervical cancer screening-past age based screening- never had a normal exam/pap. No  bleeding or discharge -had rectocele and enterocele repair 08/25/2021-last GYN visit 10/01/2021 and recovering well  5. Breast cancer screening-  breast exam -prefers to do self exams and has no lumps and mammogram-  past age based screening recommendations per USPTF 6. Colon cancer screening -   past age based screening recommendations.   Reports never had polyps.  No blood in stool.  No melena. 7. Skin cancer screening- no dermatologist . advised regular sunscreen use. Denies worrisome, changing, or new skin lesions.  8. Birth control/STD check-widowed.  Not sexually active.   9. Osteoporosis screening at 88-  declines bone density- prefers to avoid anything that may lead to meds if possible- have discussed reasoning for DEXA but she declines again -former smoker-  quit in 1980s- no screenings related to this   Status of chronic or acute concerns   # Left leg pain - likely radicular pain see separate note  #Permanent atrial fibrillation with secondary hypercoagulable state as a result S: Medication: Eliquis 5 mg twice daily, plan is for rate control strategy and she is on atenolol 100 mg -There was evidence of a remote lacunar infarct when she was seen in the  emergency department in January 2023-could be related to prior A-fib- prefers to stay off statin  A/P: appropriately anticoagulated and rate controlled- continue current meds.    #hypertension S: medication: Amlodipine '10Mg'$ , Atenolol '100Mg'$ , HCTZ '25Mg'$ , losartan '100MG'$  BP Readings from Last 3 Encounters:  06/25/22 126/71  12/18/21 128/70  10/22/21 (!) 141/88  A/P: excellent control- continue current meds   #Chronic kidney disease stage III S: GFR is typically in the 50srange-55 on last check -Patient knows to avoid NSAIDs  A/P: Hopefully stable-update CMP with labs today  #CLL-follows closely with Dr. Alvy Bimler with last visit 06/17/2021 and has been stable.    Also has thrombocytopenia related to CLL- will update CBC   #hyperlipidemia- see problem oriented note  # Hyperglycemia/insulin resistance/prediabetes- a1c as high as 6 S:  Medication: None  Lab Results  Component Value Date   HGBA1C 6.2 12/25/2021   HGBA1C 5.9 11/29/2020   HGBA1C 5.5 05/30/2020  A/P: slightly increase last visit- update a1c today- continue to try to remain active and eat healthy- prednisone has some risks (see separate note)  Recommended follow up: Return in about 6 months (around 12/25/2022) for followup or sooner if needed.Schedule b4 you leave. Future Appointments  Date Time Provider [place]  07/03/2022  9:30 AM [facility] HEALTH COACH [facility]  12/08/2022  2:45 PM [facility] CCM PHARMACIST [facility]   Lab/Order associations: fasting   ICD-10-CM   1. Preventative health care  Z00.00     2. Hyperglycemia  R73.9 HgB A1c    3. Hyperlipidemia, unspecified hyperlipidemia type  E78.5 CBC with Differential/Platelet    Comprehensive metabolic panel    LDL cholesterol, direct      Return precautions advised.  [name], MD

## 2022-06-25 NOTE — Telephone Encounter (Signed)
[place] from the [facility] lab called to report a critical WBC on pt of 18.7. I verbally communicated this with Dr. Yong Channel.

## 2022-06-25 NOTE — Assessment & Plan Note (Addendum)
S:1 month of pain and starts in groin and goes all the way down to the ankle- but better today. Mild when bothers her . Does affect her walking through. No history of lumbar spine imaging but has degenerative changes on cervical spine film 02/09/20 No new incontinence. No saddle anesthesia. No leg weakness. Does get some left low back pain but with prolonged standing.   A/P: possible radicular/sciatica type pain- will get baseline x-ray if fails to improve with trial prednisone though I want her to hold off on taking this until we get a1c back- prefer to make sure no significant increase first.

## 2022-06-25 NOTE — Progress Notes (Signed)
Phone [phone] In person visit   Subjective:   Maria Lowe is a 88 y.o. year old very pleasant female patient who presents for/with See problem oriented charting  Past Medical History- Patient Active Problem List   Diagnosis Date Noted   Persistent atrial fibrillation ([place]) 11/29/2020    Priority: High   Thrombocytopenia ([place]) 11/26/2014    Priority: High   CLL (chronic lymphocytic leukemia) ([place]) 11/27/2013    Priority: High   Hyperlipidemia, unspecified 04/07/2018    Priority: Medium    CKD (chronic kidney disease), stage III ([place]) 02/09/2018    Priority: Medium    Hyperglycemia 05/08/2013    Priority: Medium    Essential hypertension, benign 11/07/2012    Priority: Medium    H/O rectocele repair 08/25/2021    Priority: Low   Secondary hypercoagulable state ([place]) 12/10/2020    Priority: Low   Rash 02/09/2018    Priority: Low   Seborrheic dermatitis 01/08/2017    Priority: Low   Former smoker 09/30/2015    Priority: Low   Rectocele 03/29/2015    Priority: Low   Obesity, unspecified 11/07/2012    Priority: Low   Acute left-sided low back pain with left-sided sciatica 06/25/2022    Medications- reviewed and updated Current Outpatient Medications  Medication Sig Dispense Refill   amLODipine (NORVASC) 10 MG tablet TAKE 1 TABLET BY MOUTH EVERY DAY 90 tablet 3   atenolol (TENORMIN) 100 MG tablet TAKE 1 TABLET BY MOUTH EVERY DAY 90 tablet 3   ELIQUIS 5 MG TABS tablet TAKE 1 TABLET BY MOUTH TWICE A DAY 60 tablet 5   hydrochlorothiazide (HYDRODIURIL) 25 MG tablet TAKE 1 TABLET BY MOUTH EVERY DAY 90 tablet 3   losartan (COZAAR) 100 MG tablet TAKE 1 TABLET BY MOUTH EVERY DAY 90 tablet 3   meclizine (ANTIVERT) 25 MG tablet Take 1 tablet (25 mg total) by mouth 3 (three) times daily as needed for dizziness. 30 tablet 0   Multiple Vitamins-Minerals (ONE-A-DAY WOMENS 50+ PO) Take by mouth daily.     predniSONE (DELTASONE) 20 MG tablet Take 2 pills for 3 days, 1 pill for  4 days 10 tablet 0   rosuvastatin (CRESTOR) 5 MG tablet Take 1 tablet (5 mg total) by mouth once a week. 13 tablet 3   No current facility-administered medications for this visit.     Objective:  BP 126/71   Pulse 71   Temp 98 F (36.7 C)   Ht '5\' 1"'$  (1.549 m)   Wt 148 lb 3.2 oz (67.2 kg)   SpO2 95%   BMI 28.00 kg/m  Gen: NAD, resting comfortably Back - Normal skin, Spine with normal alignment and no deformity.  No tenderness to vertebral process palpation.  Paraspinous muscles are not tender and without spasm.   Range of motion is full at neck and lumbar sacral regions. Negative Straight leg raise.  Neuro- no saddle anesthesia, 5/5 strength lower extremities, 2+ reflexes    Assessment and Plan   Hyperlipidemia, unspecified S: Medication:None.  Patient prefers to remain off statin despite remote lacunar infarct on prior imaging January 2023 Lab Results  Component Value Date   CHOL 165 12/25/2021   HDL 65.20 12/25/2021   LDLCALC 79 12/25/2021   TRIG 103.0 12/25/2021   CHOLHDL 3 12/25/2021    A/P: discussed possible statin to push LDL under 70 - trial rosuvastatin 5 mg once a week with stroke history even though could have been embolic- reducing other risk factors still makes  sense  Acute left-sided low back pain with left-sided sciatica S:1 month of pain and starts in groin and goes all the way down to the ankle- but better today. Mild when bothers her . Does affect her walking through. No history of lumbar spine imaging but has degenerative changes on cervical spine film 02/09/20 No new incontinence. No saddle anesthesia. No leg weakness. Does get some left low back pain but with prolonged standing.   A/P: possible radicular/sciatica type pain- will get baseline x-ray if fails to improve with trial prednisone though I want her to hold off on taking this until we get a1c back- prefer to make sure no significant increase first.    Recommended follow up:  as needed if fails to  improve- may refer to sports medicine as well Future Appointments  Date Time Provider [place]  07/03/2022  9:30 AM [facility] HEALTH COACH [facility]  12/08/2022  2:45 PM [facility] CCM PHARMACIST [facility]    Lab/Order associations:   ICD-10-CM   1. Acute left-sided low back pain with left-sided sciatica  M54.42 DG Lumbar Spine Complete    2. Hyperlipidemia, unspecified hyperlipidemia type  E78.5 CBC with Differential/Platelet    Comprehensive metabolic panel    LDL cholesterol, direct    Meds ordered this encounter  Medications   predniSONE (DELTASONE) 20 MG tablet    Sig: Take 2 pills for 3 days, 1 pill for 4 days    Dispense:  10 tablet    Refill:  0   rosuvastatin (CRESTOR) 5 MG tablet    Sig: Take 1 tablet (5 mg total) by mouth once a week.    Dispense:  13 tablet    Refill:  3    Return precautions advised.  [name], MD

## 2022-06-25 NOTE — Telephone Encounter (Signed)
Keba and I discussed this-patient with CLL and roughly stable with prior values

## 2022-06-25 NOTE — Assessment & Plan Note (Signed)
S: Medication:None.  Patient prefers to remain off statin despite remote lacunar infarct on prior imaging January 2023 Lab Results  Component Value Date   CHOL 165 12/25/2021   HDL 65.20 12/25/2021   LDLCALC 79 12/25/2021   TRIG 103.0 12/25/2021   CHOLHDL 3 12/25/2021    A/P: discussed possible statin to push LDL under 70 - trial rosuvastatin 5 mg once a week with stroke history even though could have been embolic- reducing other risk factors still makes sense

## 2022-06-25 NOTE — Patient Instructions (Addendum)
Trial prednisone to see if this helps with the pain as long as a1c under 7- we will call with results.   Also start rosuvastatin 5 mg once a week- ok to wait until you finish the prednisone first  If you fail to improve within 2 weeks Please go to [facility]  central [facility] (updated 11/16/2019) - located [address] across the street from [place] - in the basement - Hours: 8:30-5:00 PM M-F (with lunch from 12:30- 1 PM). You do NOT need an appointment.   -depending on progress and results may also refer to sports medicine   Please stop by lab before you go If you have mychart- we will send your results within 3 business days of [place] receiving them.  If you do not have mychart- we will call you about results within 5 business days of [place] receiving them.  *please also note that you will see labs on mychart as soon as they post. I will later go in and write notes on them- will say "notes from Dr. Yong Channel"   Recommended follow up: Return in about 6 months (around 12/25/2022) for followup or sooner if needed.Schedule b4 you leave.

## 2022-07-02 NOTE — Progress Notes (Signed)
Subjective:   Maria Lowe is a 88 y.o. female who presents for Medicare Annual (Subsequent) preventive examination. I connected with  Iyla S Smallman on 07/03/22 by a audio enabled telemedicine application and verified that I am speaking with the correct person using two identifiers.  Patient Location: Home  Provider Location: Home Office  I discussed the limitations of evaluation and management by telemedicine. The patient expressed understanding and agreed to proceed.  Review of Systems    Deferred to PCP Cardiac Risk Factors include: advanced age (>58mn, >>42women);dyslipidemia;hypertension     Objective:    Today's Vitals   07/03/22 1000  PainSc: 3    There is no height or weight on file to calculate BMI.     07/03/2022   10:11 AM 09/20/2021   12:32 PM 08/25/2021    9:19 AM 06/20/2021    9:39 AM 05/30/2021   10:25 AM 06/17/2020    9:40 AM 05/18/2019    1:55 PM  Advanced Directives  Does Patient Have a Medical Advance Directive? No No No No No No Yes  Type of Advance Directive       Living will;Healthcare Power of Attorney  Does patient want to make changes to medical advance directive?       No - Patient declined  Copy of HRobert Leein Chart?       No - copy requested  Would patient like information on creating a medical advance directive? No - Patient declined  No - Patient declined No - Patient declined  No - Patient declined     Current Medications (verified) Outpatient Encounter Medications as of 07/03/2022  Medication Sig   amLODipine (NORVASC) 10 MG tablet TAKE 1 TABLET BY MOUTH EVERY DAY   atenolol (TENORMIN) 100 MG tablet TAKE 1 TABLET BY MOUTH EVERY DAY   ELIQUIS 5 MG TABS tablet TAKE 1 TABLET BY MOUTH TWICE A DAY   hydrochlorothiazide (HYDRODIURIL) 25 MG tablet TAKE 1 TABLET BY MOUTH EVERY DAY   losartan (COZAAR) 100 MG tablet TAKE 1 TABLET BY MOUTH EVERY DAY   meclizine (ANTIVERT) 25 MG tablet Take 1 tablet (25 mg total) by mouth 3  (three) times daily as needed for dizziness.   Multiple Vitamins-Minerals (ONE-A-DAY WOMENS 50+ PO) Take by mouth daily.   rosuvastatin (CRESTOR) 5 MG tablet Take 1 tablet (5 mg total) by mouth once a week.   predniSONE (DELTASONE) 20 MG tablet Take 2 pills for 3 days, 1 pill for 4 days (Patient not taking: Reported on 07/03/2022)   No facility-administered encounter medications on file as of 07/03/2022.    Allergies (verified) Patient has no known allergies.   History: Past Medical History:  Diagnosis Date   Anticoagulant long-term use    eliquis-- managed by cardiology   Arthritis    knees   CKD (chronic kidney disease), stage III ([place]    CLL (chronic lymphocytic leukemia) ([place] 11/27/2013   oncologist-- dr gAlvy Bimler  initial dx 03/ 2015, no treatment, active survillance   Essential hypertension    followed by pcp   Full dentures    History of gastric ulcer    stomach ulcer when young   Lymphocytosis 2009   followed oncology   [place]ermanent atrial fibrillation ([place] 11/[place]   cardiologist-- primary dr w. oAudie Boxand followed by atrial clinic;    dx 003/ 2022, atenolol for rate control and on eliquis   Rectocele    Thrombocythemia 2009   related to CLL, followed  by dr Alvy Bimler   Urinary incontinence    Vaginal atrophy    Past Surgical History:  Procedure Laterality Date   APPENDECTOMY     1980s   CATARACT EXTRACTION W/ INTRAOCULAR LENS IMPLANT Bilateral 2020   RECTOCELE REPAIR N/A 08/25/2021   Procedure: POSTERIOR REPAIR (RECTOCELE);  Surgeon: Salvadore Dom, MD;  Location: [facility];  Service: Gynecology;  Laterality: N/A;   TONSILLECTOMY AND ADENOIDECTOMY     age 12   Family History  Problem Relation Age of Onset   Healthy Mother        died age 94 (communist [place] could not get records)   Other Father        died young-unclear cause   Other Sister        died in 52 "old age. hard to get answers in [place]   Other Sister        43,  hard to  get answers in [place]- possible cancer- rectal or colorectal?   Other Sister        died at 74- unclear cause   Social History   Socioeconomic History   Marital status: Widowed    Spouse name: Not on file   Number of children: 3   Years of education: Not on file   Highest education level: Not on file  Occupational History   Occupation: retired  Tobacco Use   Smoking status: Former    Years: 20.00    Types: Cigarettes    Quit date: 1980    Years since quitting: 43.8   Smokeless tobacco: Never  Vaping Use   Vaping Use: Never used  Substance and Sexual Activity   Alcohol use: Not Currently   Drug use: Never   Sexual activity: Not on file  Other Topics Concern   Not on file  Social History Narrative   Lives alone- cooks Sunday dinner, cares for home and in good shape. Widowed April 2016. 3 children. 7 grandkids. 3 greatgrandkids.   Husband worked as Chief Executive Officer.       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      Hobbies: travel- husband was buried at home ([place])   Social Determinants of Health   Financial Resource Strain: Low Risk  (07/03/2022)   Overall Financial Resource Strain (CARDIA)    Difficulty of Paying Living Expenses: Not hard at all  Food Insecurity: No Food Insecurity (07/03/2022)   Hunger Vital Sign    Worried About Running Out of Food in the Last Year: Never true    Ran Out of Food in the Last Year: Never true  Transportation Needs: No Transportation Needs (07/03/2022)   PRAPARE - Hydrologist (Medical): No    Lack of Transportation (Non-Medical): No  Physical Activity: Inactive (07/03/2022)   Exercise Vital Sign    Days of Exercise per Week: 0 days    Minutes of Exercise per Session: 0 min  Stress: No Stress Concern Present (07/03/2022)   [place]    Feeling of Stress : Not at all  Social Connections: Socially  Isolated (07/03/2022)   Social Connection and Isolation Panel [NHANES]    Frequency of Communication with Friends and Family: More than three times a week    Frequency of Social Gatherings with Friends and Family: More than three times a week    Attends Religious Services: Never    Retail buyer of [employer]  or Organizations: No    Attends Archivist Meetings: Never    Marital Status: Widowed    Tobacco Counseling Counseling given: Not Answered   Clinical Intake:  Pre-visit preparation completed: Yes  Pain : 0-10 Pain Score: 3  Pain Type: Chronic pain Pain Location: Generalized     Nutritional Status: BMI 25 -29 Overweight Diabetes: No  How often do you need to have someone help you when you read instructions, pamphlets, or other written materials from your doctor or pharmacy?: 1 - Never  Diabetic?No  Interpreter Needed?: No  Information entered by :: Emelia Loron RN   Activities of Daily Living    07/03/2022   10:17 AM 08/25/2021    9:23 AM  In your present state of health, do you have any difficulty performing the following activities:  Hearing? 0 0  Vision? 0 0  Difficulty concentrating or making decisions? 0 0  Walking or climbing stairs? 0 0  Dressing or bathing? 0 0  Doing errands, shopping? 0   Preparing Food and eating ? N   Using the Toilet? N   In the past six months, have you accidently leaked urine? N   Do you have problems with loss of bowel control? N   Managing your Medications? N   Managing your Finances? N   Housekeeping or managing your Housekeeping? N     Patient Care Team: [name], MD as PCP - General (Family Medicine) Heath Lark, MD as Consulting Physician (Hematology and Oncology) Edythe Clarity, [facility] (Pharmacist)  Indicate any recent Medical Services you may have received from other than [facility] providers in the past year (date may be approximate).     Assessment:   This is a routine wellness examination for  Ne[place].  Hearing/Vision screen No results found.  Dietary issues and exercise activities discussed: Current Exercise Habits: The patient does not participate in regular exercise at present, Exercise limited by: orthopedic condition(s)   Goals Addressed             This Visit's Progress    Patient Stated       Maintain current health status.      Depression Screen    07/03/2022   10:07 AM 06/25/2022    8:24 AM 06/20/2021    9:38 AM 06/13/2021    9:15 AM 11/29/2020    9:38 AM 06/17/2020    9:39 AM 05/30/2020    1:16 PM  PHQ 2/9 Scores  PHQ - 2 Score 0 0 0 1 0 0 0  PHQ- 9 Score  0     0    Fall Risk    07/03/2022   10:11 AM 06/25/2022    8:23 AM 06/20/2021    9:41 AM 06/13/2021    9:15 AM 11/29/2020    9:38 AM  Fall Risk   Falls in the past year? 0 0 0 0 0  Number falls in past yr: 0 0 0 0 0  Injury with Fall? 0 0 0 0 0  Risk for fall due to : History of fall(s);Impaired balance/gait;Impaired mobility;Impaired vision History of fall(s) Impaired vision No Fall Risks   Follow up Falls evaluation completed Falls evaluation completed Falls prevention discussed Falls evaluation completed     FALL RISK PREVENTION PERTAINING TO THE HOME:  Any stairs in or around the home? Yes  If so, are there any without handrails? Yes  Home free of loose throw rugs in walkways, pet beds, electrical cords, etc? Yes  Adequate lighting in your home to reduce risk of falls? Yes   ASSISTIVE DEVICES UTILIZED TO PREVENT FALLS:  Life alert? No  Use of a cane, walker or w/c? Yes  Grab bars in the bathroom? Yes  Shower chair or bench in shower? Yes  Elevated toilet seat or a handicapped toilet? No   Cognitive Function:        07/03/2022   10:09 AM 06/20/2021    9:42 AM 06/17/2020    9:44 AM 11/23/2019   12:54 PM 05/18/2019    1:56 PM  6CIT Screen  What Year? 0 points 0 points 0 points 0 points 0 points  What month? 0 points 0 points 0 points  0 points  What time? 0 points 0 points   0  points  Count back from 20 0 points 0 points 0 points  0 points  Months in reverse 0 points 0 points 0 points  0 points  Repeat phrase 0 points 0 points 6 points  0 points  Total Score 0 points 0 points   0 points    Immunizations Immunization History  Administered Date(s) Administered   Fluad Quad(high Dose 65+) 07/17/2021   Influenza Whole 06/30/2013   Influenza, High Dose Seasonal PF 06/25/2016, 06/21/2019, 06/18/2022   Influenza,inj,Quad PF,6+ Mos 06/13/2014   Influenza-Unspecified 07/12/2015, 06/09/2017, 06/21/2020   PFIZER Comirnaty(Gray Top)Covid-19 Tri-Sucrose Vaccine 01/30/2021   PFIZER(Purple Top)SARS-COV-2 Vaccination 12/13/2019, 12/30/2019, 07/12/2020   Pfizer Covid-19 Vaccine Bivalent Booster 27yr & up 07/17/2021   Pneumococcal Conjugate-13 06/25/2016   Pneumococcal Polysaccharide-23 06/21/2013   Td 04/07/2018    TDAP status: Up to date  Flu Vaccine status: Up to date  Pneumococcal vaccine status: Up to date  Covid-19 vaccine status: Information provided on how to obtain vaccines.   Qualifies for Shingles Vaccine? Yes   Zostavax completed No   Shingrix Completed?: No.    Education has been provided regarding the importance of this vaccine. Patient has been advised to call insurance company to determine out of pocket expense if they have not yet received this vaccine. Advised may also receive vaccine at local pharmacy or Health Dept. Verbalized acceptance and understanding.  Screening Tests Health Maintenance  Topic Date Due   COVID-19 Vaccine (6 - Pfizer risk series) 07/11/2022 (Originally 09/11/2021)   Zoster Vaccines- Shingrix (1 of 2) 09/25/2022 (Originally 06/21/1950)   DEXA SCAN  10/23/2043 (Originally 06/21/1998)   TETANUS/TDAP  04/07/2028   Pneumonia Vaccine 88 Years old  Completed   INFLUENZA VACCINE  Completed   HPV VACCINES  Aged Out    Health Maintenance  There are no preventive care reminders to display for this patient.  Colorectal cancer  screening: No longer required.   Mammogram status: No longer required due to age.  Lung Cancer Screening: (Low Dose CT Chest recommended if Age 86-80years, 30 pack-year currently smoking OR have quit w/in 15years.) does not qualify.   Additional Screening:  Vision Screening: Recommended annual ophthalmology exams for early detection of glaucoma and other disorders of the eye. Is the patient up to date with their annual eye exam?  No  Who is the provider or what is the name of the office in which the patient attends annual eye exams? Dr. GKaty FitchIf pt is not established with a provider, would they like to be referred to a provider to establish care?  N/A .   Dental Screening: Recommended annual dental exams for proper oral hygiene  Community Resource Referral / Chronic Care Management: CRR required  this visit?  No   CCM required this visit?  No      Plan:     I have personally reviewed and noted the following in the patient's chart:   Medical and social history Use of alcohol, tobacco or illicit drugs  Current medications and supplements including opioid prescriptions. Patient is not currently taking opioid prescriptions. Functional ability and status Nutritional status Physical activity Advanced directives List of other physicians Hospitalizations, surgeries, and ER visits in previous 12 months Vitals Screenings to include cognitive, depression, and falls Referrals and appointments  In addition, I have reviewed and discussed with patient certain preventive protocols, quality metrics, and best practice recommendations. A written personalized care plan for preventive services as well as general preventive health recommendations were provided to patient.     Michiel Cowboy, RN   07/03/2022   Nurse Notes:  Ms. Facilities manager , Thank you for taking time to come for your Medicare Wellness Visit. I appreciate your ongoing commitment to your health goals. Please review the following plan  we discussed and let me know if I can assist you in the future.   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     PharmD Care Plan     CARE PLAN ENTRY (see longitudinal plan of care for additional care plan information)  Current Barriers:  Chronic Disease Management support, education, and care coordination needs related to Hypertension, Hyperlipidemia, and hyperglycemia.   Hypertension BP Readings from Last 3 Encounters:  05/30/20 126/62  02/27/20 140/61  02/09/20 128/74  Pharmacist Clinical Goal(s): Over the next 365 days, patient will work with PharmD and providers to maintain BP goal <140/90 Current regimen:  Amlodipine 10 mg once daily Atenolol 100 mg once daily  Hydrochlorothiazide 25 mg once daily Losartan 100 mg once daily Interventions: Reviewed home monitoring recommendations Reviewed diet and exercise - Maintain a healthy weight and exercise regularly, as directed by your health care provider. Eat healthy foods, such as: Lean proteins, complex carbohydrates, fresh fruits and vegetables, low-fat dairy products, healthy fats. Reviewed recommendations to maintain reduced salt intake.  Patient self care activities - Over the next 365 days, patient will: Check BP at least once every 1-2 weeks, document, and provide at future appointments Ensure daily salt intake < 2300 mg/day  Hyperlipidemia Lab Results  Component Value Date/Time   LDLCALC 113 (H) 05/30/2020 02:19 PM  Pharmacist Clinical Goal(s): Over the next 365 days, patient will work with PharmD and providers to maintain LDL goal < 100 Current regimen:  N/a Interventions: Reviewed side effects/tolerability - none noted at this time Patient self care activities - Over the next 365 days,  patient will: Continue current management  Hyperglycemia Lab Results  Component Value Date/Time   HGBA1C 5.5 05/30/2020 02:19 PM   HGBA1C 5.8 11/23/2019 01:11 PM  Pharmacist Clinical Goal(s): Over the next 365 days, patient will work with PharmD and providers to maintain A1c goal <6.5% Current regimen:  N/a Interventions: Reviewed diet - Maintain a healthy weight and exercise regularly, as directed by your health care provider. Eat healthy foods, such as: Lean proteins, complex carbohydrates,  fresh fruits and vegetables, low-fat dairy products, healthy fats. Patient self care activities - Over the next 365 days, patient will: Continue current management Continue to limit intake of white breads  Medication management Pharmacist Clinical Goal(s): Over the next 365 days, patient will work with PharmD and providers to maintain optimal medication adherence Current pharmacy: [facility] Interventions Comprehensive medication review performed. Continue current medication management strategy. Patient self care activities - Over the next 365 days, patient will: Take medications as prescribed Report any questions or concerns to PharmD and/or provider(s) Initial goal documentation.      Track and Manage My Blood Pressure-Hypertension     Timeframe:  Long-Range Goal Priority:  High Start Date: 05/12/21                            Expected End Date:  11/12/21                     Follow Up Date 08/20/21    - check blood pressure weekly - choose a place to take my blood pressure (home, clinic or office, retail store) - write blood pressure results in a log or diary    Why is this important?   You won't feel high blood pressure, but it can still hurt your blood vessels.  High blood pressure can cause heart or kidney problems. It can also cause a stroke.  Making lifestyle changes like losing a little weight or eating less salt will help.  Checking your blood pressure at home and at  different times of the day can help to control blood pressure.  If the doctor prescribes medicine remember to take it the way the doctor ordered.  Call the office if you cannot afford the medicine or if there are questions about it.     Notes:         This is a list of the screening recommended for you and due dates:  Health Maintenance  Topic Date Due   COVID-19 Vaccine (6 - Pfizer risk series) 07/11/2022*   Zoster (Shingles) Vaccine (1 of 2) 09/25/2022*   DEXA scan (bone density measurement)  10/23/2043*   Tetanus Vaccine  04/07/2028   Pneumonia Vaccine  Completed   Flu Shot  Completed   HPV Vaccine  Aged Out  *Topic was postponed. The date shown is not the original due date.

## 2022-07-02 NOTE — Patient Instructions (Signed)

## 2022-07-03 ENCOUNTER — Ambulatory Visit (INDEPENDENT_AMBULATORY_CARE_PROVIDER_SITE_OTHER): Payer: PPO | Admitting: *Deleted

## 2022-07-03 ENCOUNTER — Ambulatory Visit: Payer: PPO

## 2022-07-03 DIAGNOSIS — Z0000 Encounter for general adult medical examination without abnormal findings: Secondary | ICD-10-CM | POA: Diagnosis not present

## 2022-08-18 ENCOUNTER — Ambulatory Visit (INDEPENDENT_AMBULATORY_CARE_PROVIDER_SITE_OTHER)
Admission: RE | Admit: 2022-08-18 | Discharge: 2022-08-18 | Disposition: A | Discharge status: Home / Self Care | Payer: PPO | Source: Ambulatory Visit | Attending: Family Medicine | Admitting: Family Medicine

## 2022-08-18 DIAGNOSIS — M79605 Pain in left leg: Secondary | ICD-10-CM

## 2022-08-18 DIAGNOSIS — M47816 Spondylosis without myelopathy or radiculopathy, lumbar region: Secondary | ICD-10-CM | POA: Diagnosis not present

## 2022-08-18 DIAGNOSIS — M5442 Lumbago with sciatica, left side: Secondary | ICD-10-CM | POA: Diagnosis not present

## 2022-08-18 DIAGNOSIS — M545 Low back pain, unspecified: Secondary | ICD-10-CM | POA: Diagnosis not present

## 2022-08-19 ENCOUNTER — Other Ambulatory Visit: Payer: Self-pay

## 2022-08-19 DIAGNOSIS — M79605 Pain in left leg: Secondary | ICD-10-CM

## 2022-08-24 NOTE — Progress Notes (Unsigned)
   I, Peterson Lombard, LAT, ATC acting as a scribe for Lynne Leader, MD.  Subjective:    CC: R LE pain  HPI: Pt is an 88 y/o female c/o R LE pain x /. Pt locates pain to  Dx imaging: 08/18/22 L-spine XR  Pertinent review of Systems: ***  Relevant historical information: ***   Objective:   There were no vitals filed for this visit. General: Well Developed, well nourished, and in no acute distress.   MSK: ***  Lab and Radiology Results No results found for this or any previous visit (from the past 72 hour(s)). No results found.    Impression and Recommendations:    Assessment and Plan: 88 y.o. female with ***.  PDMP not reviewed this encounter. No orders of the defined types were placed in this encounter.  No orders of the defined types were placed in this encounter.   Discussed warning signs or symptoms. Please see discharge instructions. Patient expresses understanding.   ***

## 2022-08-25 ENCOUNTER — Ambulatory Visit: Payer: Self-pay

## 2022-08-25 ENCOUNTER — Ambulatory Visit: Payer: PPO | Admitting: Family Medicine

## 2022-08-25 ENCOUNTER — Ambulatory Visit (INDEPENDENT_AMBULATORY_CARE_PROVIDER_SITE_OTHER): Payer: PPO

## 2022-08-25 VITALS — BP 150/84 | HR 75 | Ht 61.0 in | Wt 150.0 lb

## 2022-08-25 DIAGNOSIS — M25561 Pain in right knee: Secondary | ICD-10-CM | POA: Diagnosis not present

## 2022-08-25 DIAGNOSIS — M25552 Pain in left hip: Secondary | ICD-10-CM

## 2022-08-25 DIAGNOSIS — M5416 Radiculopathy, lumbar region: Secondary | ICD-10-CM

## 2022-08-25 DIAGNOSIS — M25551 Pain in right hip: Secondary | ICD-10-CM | POA: Diagnosis not present

## 2022-08-25 DIAGNOSIS — G8929 Other chronic pain: Secondary | ICD-10-CM

## 2022-08-25 MED ORDER — GABAPENTIN 100 MG PO CAPS: 100.0000 mg | ORAL_CAPSULE | Freq: Every evening | ORAL | 3 refills | Status: DC | PRN

## 2022-08-25 NOTE — Patient Instructions (Addendum)
Thank you for coming in today.   Please get an Xray today before you leave   You received an injection today. Seek immediate medical attention if the joint becomes red, extremely painful, or is oozing fluid.   You should hear from MRI scheduling within 1 week. If you do not hear please let me know.    Try gabapentin at bedtime as needed for pain.

## 2022-08-26 NOTE — Progress Notes (Signed)
Right knee x-ray shows severe arthritis

## 2022-08-26 NOTE — Progress Notes (Signed)
Left hip x-ray shows significant arthritis in both hip joints.  The arthritis is severe on the left

## 2022-09-19 ENCOUNTER — Ambulatory Visit
Admission: RE | Admit: 2022-09-19 | Discharge: 2022-09-19 | Disposition: A | Discharge status: Home / Self Care | Payer: PPO | Source: Ambulatory Visit | Attending: Family Medicine | Admitting: Family Medicine

## 2022-09-19 DIAGNOSIS — M48061 Spinal stenosis, lumbar region without neurogenic claudication: Secondary | ICD-10-CM | POA: Diagnosis not present

## 2022-09-19 DIAGNOSIS — M4316 Spondylolisthesis, lumbar region: Secondary | ICD-10-CM | POA: Diagnosis not present

## 2022-09-19 DIAGNOSIS — M5416 Radiculopathy, lumbar region: Secondary | ICD-10-CM

## 2022-09-23 NOTE — Progress Notes (Signed)
Lumbar spine MRI shows multiple levels that could be causing problems and pinched nerves and the leg pain that you are experiencing.  Recommend return to clinic to go over the results in full detail and talk about treatment plan and options from here.

## 2022-09-30 NOTE — Progress Notes (Unsigned)
   I, Peterson Lombard, LAT, ATC acting as a scribe for Lynne Leader, MD.  Maria Lowe is a 89 y.o. female who presents to [place] [facility] today for follow-up of bilateral hip pain, lumbar radiculopathy, and L-spine MRI review.  Patient was last seen by Dr. Georgina Snell on 08/25/2022 and was given a left femoral acetabular steroid injection, was prescribed gabapentin, and advised to proceed to L-spine MRI.  Today, patient reports  Dx imaging: 09/19/2022 L-spine MRI  08/25/2022 L hip XR 08/18/22 L-spine XR   Pertinent review of systems: ***  Relevant historical information: ***   Exam:  There were no vitals taken for this visit. General: Well Developed, well nourished, and in no acute distress.   MSK: ***    Lab and Radiology Results No results found for this or any previous visit (from the past 72 hour(s)). No results found.     Assessment and Plan: 89 y.o. female with ***   PDMP not reviewed this encounter. No orders of the defined types were placed in this encounter.  No orders of the defined types were placed in this encounter.    Discussed warning signs or symptoms. Please see discharge instructions. Patient expresses understanding.   ***

## 2022-10-01 ENCOUNTER — Ambulatory Visit (INDEPENDENT_AMBULATORY_CARE_PROVIDER_SITE_OTHER): Payer: PPO | Admitting: Family Medicine

## 2022-10-01 VITALS — BP 138/78 | HR 78 | Ht 61.0 in | Wt 146.0 lb

## 2022-10-01 DIAGNOSIS — M5442 Lumbago with sciatica, left side: Secondary | ICD-10-CM

## 2022-10-01 DIAGNOSIS — M5416 Radiculopathy, lumbar region: Secondary | ICD-10-CM

## 2022-10-01 DIAGNOSIS — G8929 Other chronic pain: Secondary | ICD-10-CM

## 2022-10-01 DIAGNOSIS — M25552 Pain in left hip: Secondary | ICD-10-CM

## 2022-10-01 NOTE — Patient Instructions (Signed)
Thank you for coming in today.   We can do more if you need me to for your back pain or leg pain with different kinds of back injections.   Let me know if you would like me to arrange for those injections.   I can repeat the hip injection around early March if needed.   Recheck as needed.

## 2022-11-16 ENCOUNTER — Other Ambulatory Visit: Payer: Self-pay | Admitting: Family Medicine

## 2022-12-07 ENCOUNTER — Telehealth: Payer: Self-pay | Admitting: Pharmacist

## 2022-12-07 NOTE — Progress Notes (Signed)
Care Management & Coordination Services Pharmacy Team  Reason for Encounter: Appointment Reminder  Contacted patient to confirm telephone appointment with Leata Mouse, PharmD on 12/08/2022 at 3:30 pm. Unsuccessful outreach. Left voicemail with appointment details.   Star Rating Drugs:  Losartan 100 mg last filled 11/11/2022 90 DS Rosuvastatin 5 mg last filled 06/25/2022 90 DS   Care Gaps: Annual wellness visit in last year? Yes   Future Appointments  Date Time Provider [place]  12/08/2022  3:30 PM Edythe Clarity [place] None  01/07/2023  8:00 AM [name], MD [facility]   April D Calhoun, [place] Pharmacist Assistant [phone]

## 2022-12-08 ENCOUNTER — Ambulatory Visit: Payer: PPO | Admitting: Pharmacist

## 2022-12-08 NOTE — Progress Notes (Signed)
Care Management & Coordination Services Pharmacy Note  12/11/2022 Name:  Ori Ehresman Haber MRN:  [id number] DOB:  06-May-1931  Summary: PharmD FU visit.  Patient doing very well managing her chronic conditions  No recent dizziness.  All meds are accessible and adherence is 100%  Recommendations/Changes made from today's visit: No changes   Follow up plan: FU 1 year   Subjective: Maria Lowe is an 89 y.o. year old female who is a primary patient of Yong Channel, Brayton Mars, MD.  The care coordination team was consulted for assistance with disease management and care coordination needs.    Engaged with patient by telephone for follow up visit.  Recent office visits:  02/23/2022 TE (PCP) Patient wishes to remain on Eliquis vs bridge with Lovenox to Warfarin.   12/18/2021 OV (PCP) [name], MD;  appropriately anticoagulated and rate controlled but eliquis is cost prohibitive- discussed possible CCM pharmacy consult vs. Coumadin- shed prefer to transition to coumadin and advised to schedule appointment     Recent consult visits:  None since last CPP visit   Hospital visits:  None since last CPP visit   Objective:  Lab Results  Component Value Date   CREATININE 0.93 06/25/2022   BUN 25 (H) 06/25/2022   GFR 54.68 (L) 06/25/2022   EGFR 54 (L) 11/28/2015   GFRNONAA 56 (L) 10/21/2021   GFRAA 68 05/30/2020   NA 139 06/25/2022   K 4.4 06/25/2022   CALCIUM 9.7 06/25/2022   CO2 31 06/25/2022   GLUCOSE 133 (H) 06/25/2022    Lab Results  Component Value Date/Time   HGBA1C 6.1 06/25/2022 09:18 AM   HGBA1C 6.2 12/25/2021 08:09 AM   GFR 54.68 (L) 06/25/2022 09:18 AM   GFR 54.88 (L) 12/25/2021 08:09 AM    Last diabetic Eye exam: No results found for: "HMDIABEYEEXA"  Last diabetic Foot exam: No results found for: "HMDIABFOOTEX"   Lab Results  Component Value Date   CHOL 165 12/25/2021   HDL 65.20 12/25/2021   LDLCALC 79 12/25/2021   LDLDIRECT 81.0 06/25/2022   TRIG 103.0  12/25/2021   CHOLHDL 3 12/25/2021       Latest Ref Rng & Units 06/25/2022    9:18 AM 12/25/2021    8:09 AM 08/25/2021    9:22 AM  Hepatic Function  Total Protein 6.0 - 8.3 g/dL 6.6  6.2  6.5   Albumin 3.5 - 5.2 g/dL 4.7  4.5  4.5   AST 0 - 37 U/L 29  28  25    ALT 0 - 35 U/L 27  28  21    Alk Phosphatase 39 - 117 U/L 85  68  58   Total Bilirubin 0.2 - 1.2 mg/dL 1.2  0.7  1.2     Lab Results  Component Value Date/Time   TSH 0.691 08/12/2021 02:21 PM   TSH 1.31 04/19/2017 04:25 PM       Latest Ref Rng & Units 06/25/2022    9:18 AM 12/25/2021    8:09 AM 10/21/2021    7:40 PM  CBC  WBC 4.0 - 10.5 K/uL 18.7 Repeated and verified X2.  16.4  16.7   Hemoglobin 12.0 - 15.0 g/dL 13.3  13.6  13.9   Hematocrit 36.0 - 46.0 % 41.3  41.7  43.7   Platelets 150.0 - 400.0 K/uL 102.0  102.0  109     No results found for: "VD25OH", "VITAMINB12"  Clinical ASCVD: No  The ASCVD Risk score (Arnett DK, et al.,  2019) failed to calculate for the following reasons:   The 2019 ASCVD risk score is only valid for ages 27 to 62        07/03/2022   10:07 AM 06/25/2022    8:24 AM 06/20/2021    9:38 AM  Depression screen PHQ 2/9  Decreased Interest 0 0 0  Down, Depressed, Hopeless 0 0 0  PHQ - 2 Score 0 0 0  Altered sleeping  0   Tired, decreased energy  0   Change in appetite  0   Feeling bad or failure about yourself   0   Trouble concentrating  0   Moving slowly or fidgety/restless  0   Suicidal thoughts  0   PHQ-9 Score  0   Difficult doing work/chores  Not difficult at all      Social History   Tobacco Use  Smoking Status Former   Years: 20   Types: Cigarettes   Quit date: 1980   Years since quitting: 44.2  Smokeless Tobacco Never   BP Readings from Last 3 Encounters:  10/01/22 138/78  08/25/22 (!) 150/84  06/25/22 126/71   Pulse Readings from Last 3 Encounters:  10/01/22 78  08/25/22 75  06/25/22 71   Wt Readings from Last 3 Encounters:  10/01/22 146 lb (66.2 kg)  08/25/22  150 lb (68 kg)  06/25/22 148 lb 3.2 oz (67.2 kg)   BMI Readings from Last 3 Encounters:  10/01/22 27.59 kg/m  08/25/22 28.34 kg/m  06/25/22 28.00 kg/m    No Known Allergies  Medications Reviewed Today     Reviewed by Edythe Clarity, RPH (Pharmacist) on 12/11/22 at 1500  Med List Status: <None>   Medication Order Taking? Sig Documenting Provider Last Dose Status Informant  amLODipine (NORVASC) 10 MG tablet [id number] Yes TAKE 1 TABLET BY MOUTH EVERY DAY [name], MD Taking Active   atenolol (TENORMIN) 100 MG tablet [id number] Yes TAKE 1 TABLET BY MOUTH EVERY DAY [name], MD Taking Active   ELIQUIS 5 MG TABS tablet [id number] Yes TAKE 1 TABLET BY MOUTH TWICE A DAY [name], MD Taking Active   gabapentin (NEURONTIN) 100 MG capsule [id number] Yes Take 1 capsule (100 mg total) by mouth at bedtime as needed (nerve pain). Gregor Hams, MD Taking Active   hydrochlorothiazide (HYDRODIURIL) 25 MG tablet [id number] Yes TAKE 1 TABLET BY MOUTH EVERY DAY [name], MD Taking Active   losartan (COZAAR) 100 MG tablet [id number] Yes TAKE 1 TABLET BY MOUTH EVERY DAY [name], MD Taking Active   meclizine (ANTIVERT) 25 MG tablet [id number] Yes Take 1 tablet (25 mg total) by mouth 3 (three) times daily as needed for dizziness. Lennice Sites, DO Taking Active   Multiple Vitamins-Minerals (ONE-A-DAY WOMENS 50+ PO) [id number] Yes Take by mouth daily. [provider] Taking Active Self  rosuvastatin (CRESTOR) 5 MG tablet [id number] Yes Take 1 tablet (5 mg total) by mouth once a week. [name], MD Taking Active             SDOH:  (Social Determinants of Health) assessments and interventions performed: No Financial Resource Strain: Low Risk  (07/03/2022)   Overall Financial Resource Strain (CARDIA)    Difficulty of Paying Living Expenses: Not hard at all   Food Insecurity: No Food Insecurity (07/03/2022)   Hunger Vital Sign    Worried About  Running Out of Food in the Last Year: Never true    Ran Out  of Food in the Last Year: Never true    SDOH Interventions    Flowsheet Row Clinical Support from 07/03/2022 in [facility] Interventions Intervention Not Indicated  Housing Interventions Intervention Not Indicated  Transportation Interventions Intervention Not Indicated  Utilities Interventions Intervention Not Indicated  Alcohol Usage Interventions Intervention Not Indicated (Score <7)  Financial Strain Interventions Intervention Not Indicated  Physical Activity Interventions Intervention Not Indicated  Stress Interventions Intervention Not Indicated  Social Connections Interventions Intervention Not Indicated       Medication Assistance: None required.  Patient affirms current coverage meets needs.  Medication Access: Within the past 30 days, how often has patient missed a dose of medication? 0 Is a pillbox or other method used to improve adherence? Yes  Factors that may affect medication adherence? no barriers identified Are meds synced by current pharmacy? No  Are meds delivered by current pharmacy? No  Does patient experience delays in picking up medications due to transportation concerns? No   [facility] Services Reviewed: Is patient disadvantaged to use [facility]?: Yes  Current Rx insurance plan: HTA Name and location of Current pharmacy:  [facility] [address] [place] [address] Phone: [phone] Fax: [phone]  [id number] [place], [place] [place] [address] Phone: [phone] Fax: [phone]  [facility] services reviewed with patient today?: Yes  Patient requests to transfer care to [facility]?: No  Reason patient declined to change pharmacies: Disadvantaged due to  insurance/mail order  Compliance/Adherence/Medication fill history: Care Gaps: Losartan 100 mg last filled 11/11/2022 90 DS Rosuvastatin 5 mg last filled 06/25/2022 90 DS     Care Gaps: Annual wellness visit in last year? Yes   Assessment/Plan     Hypertension (BP goal <130/80) 12/09/22 -Controlled -Current treatment: Amlodipine 10mg  daily Appropriate, Effective, Safe, Accessible Atenolol 100mg  daily Appropriate, Effective, Safe, Accessible HCTZ 25mg  daily Appropriate, Effective, Safe, Accessible Losartan 100mg  daily Appropriate, Effective, Safe, Accessible -Medications previously tried: none noted  -Current home readings: well controlled -Denies hypotensive/hypertensive symptoms -Educated on BP goals and benefits of medications for prevention of heart attack, stroke and kidney damage; Importance of home blood pressure monitoring; Symptoms of hypotension and importance of maintaining adequate hydration; -Patient doing well, no recent concerns.  She has not had anymore visits due to dizziness.  At this time no changes, continue to monitor BP at home contact us if there is any consistent elevation.  Update 11/25/21 Continue on meds above BP controlled at home - no specific readings today but normally around 120/80 per patient reports. Denies any recent dizziness She feels good considering her age. Continue to monitor at home - will have regular check ins on BP/dizziness.  Hyperlipidemia: (LDL goal < 100) -Not ideally controlled -Current treatment: None -Medications previously tried: none noted  -LDL is above goal, however due to age she is not on statin -Educated on Cholesterol goals;  Importance of limiting foods high in cholesterol; -Recommended continue current management strategies.  Atrial Fibrillation (Goal: prevent stroke and major bleeding) 12/09/22 -Controlled -CHADSVASC: 4 -Current treatment: Rate control: Atenolol 100mg  daily Appropriate, Effective, Safe,  Accessible Anticoagulation: Eliquis 5mg  BID Appropriate, Effective, Safe, Accessible -Medications previously tried: none noted -Home BP and HR readings: well controlled  -Counseled on increased risk of stroke due to Afib and benefits of anticoagulation for stroke prevention; bleeding risk  associated with Eliquis and importance of self-monitoring for signs/symptoms of bleeding; avoidance of NSAIDs due to increased bleeding risk with anticoagulants; -Eliquis dose still appropriate and affordable.  Overall patient has no concerns.  Continue to monitor weight and renal function.  Adjust dose as necessary.  Update 11/25/21 Continues to take Eliquis - mentions high copay when she enters the donut hole.  Discussed PAP program this year when this time comes.  Will have CMA follow up with her in a few months to see if she needs assistance.  She did have a fall recently where she was dizzy - this has not happened lately.  BP/HR have been normal. No changes needed at this time - monitor weight for dose decrease of Eliquis due to age.              Beverly Milch, PharmD Clinical Pharmacist  [facility] [phone]

## 2022-12-13 IMAGING — MR MR HEAD W/O CM
12 of 13 series · 44 of 48 positions shown · non-contrast
Comparison: CT head without contrast 05/30/2021. MR head without
and with contrast 05/09/2017.

CLINICAL DATA: TIA. Dizzy. Difficulty ambulating.

EXAM:
MRI HEAD WITHOUT CONTRAST
TECHNIQUE: Multiplanar, multiecho pulse sequences of the brain and surrounding
structures were obtained without intravenous contrast.

[Series 5: DWI · axial · 3.0mm · 0.88mm/px · z∈[-85,+55]mm · 8 of 96 slices shown (1 of 4)]
[im 1/96]
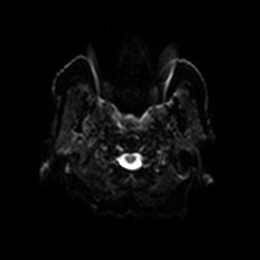
[im 14/96]
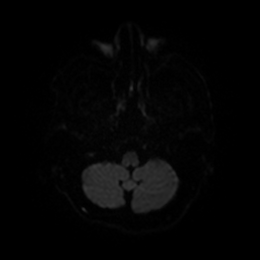
[im 28/96]
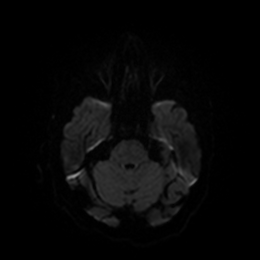
[im 41/96]
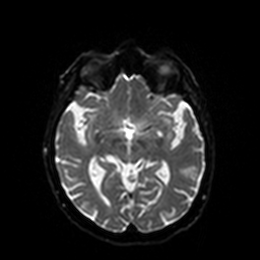
[im 55/96]
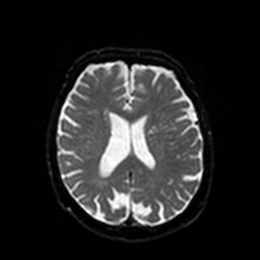
[im 68/96]
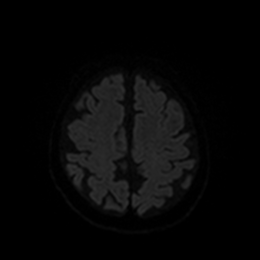
[im 82/96]
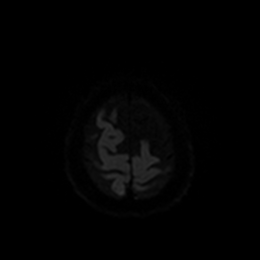
[im 96/96]
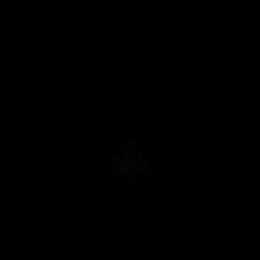

[Series 6: DWI · axial · 3.0mm · 0.88mm/px · z∈[-85,+55]mm · 4 of 48 slices shown (2 of 4)]
[im 1/48]
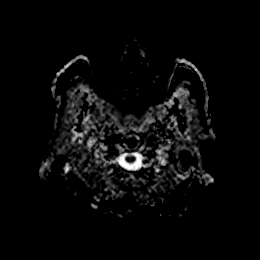
[im 16/48]
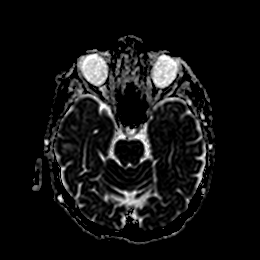
[im 32/48]
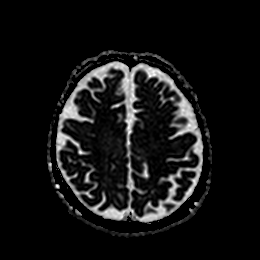
[im 48/48]
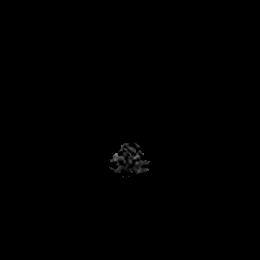

[Series 7: DWI · coronal · 4.0mm · 0.88mm/px · 5 of 64 slices shown (3 of 4)]
[im 1/64]
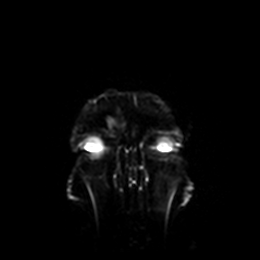
[im 16/64]
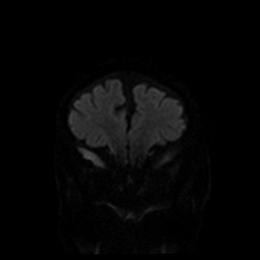
[im 32/64]
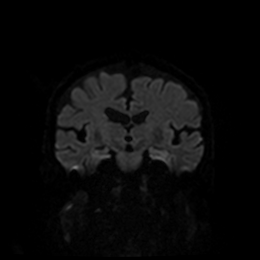
[im 48/64]
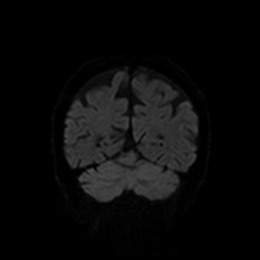
[im 64/64]
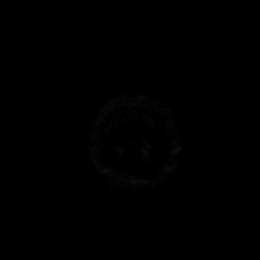

[Series 8: DWI · coronal · 4.0mm · 0.88mm/px · 3 of 32 slices shown (4 of 4)]
[im 1/32]
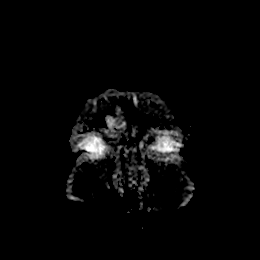
[im 16/32]
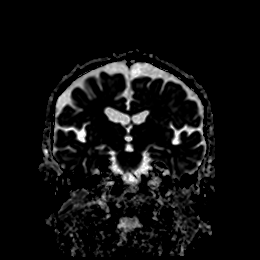
[im 32/32]
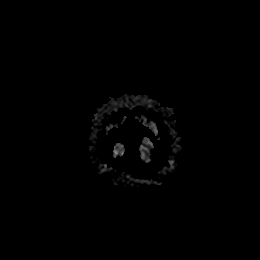

[Series 9: T1 · sagittal · 5.0mm · 0.75mm/px · 2 of 24 slices shown]
[im 1/24]
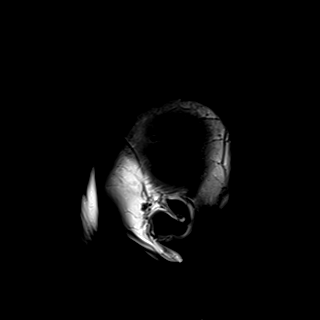
[im 24/24]
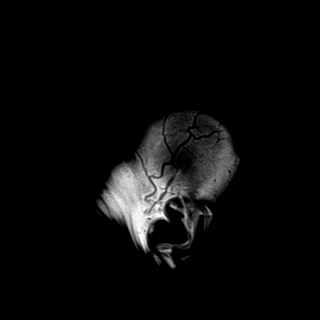

[Series 10: T2 · axial · 5.0mm · 0.72mm/px · z∈[-87,+57]mm · 2 of 25 slices shown (1 of 2)]
[im 1/25]
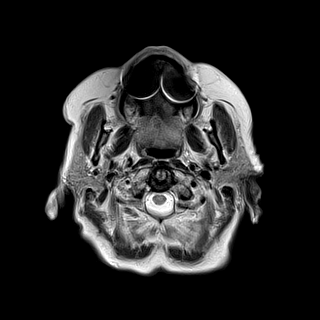
[im 25/25]
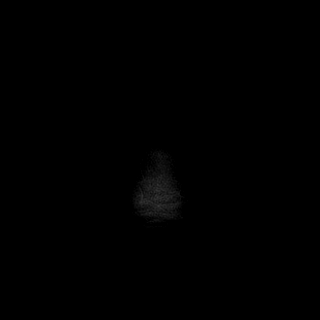

[Series 11: FLAIR · axial · 5.0mm · 0.45mm/px · z∈[-86,+58]mm · 2 of 25 slices shown]
[im 1/25]
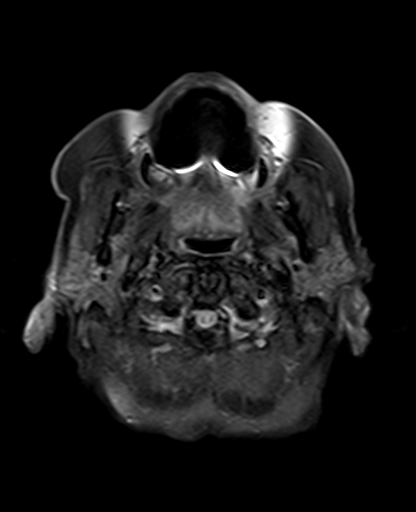
[im 25/25]
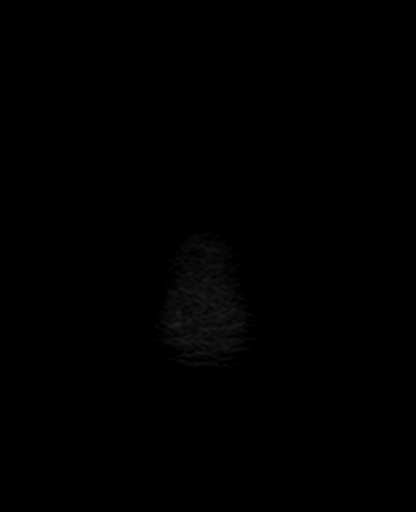

[Series 12: mag_images · axial · 3.0mm · 0.90mm/px · z∈[-91,+62]mm · 4 of 52 slices shown]
[im 1/52]
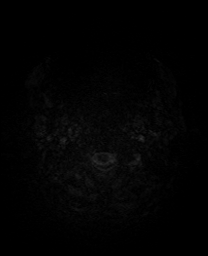
[im 18/52]
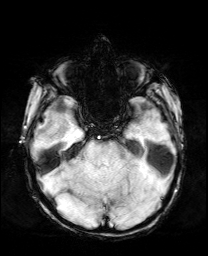
[im 35/52]
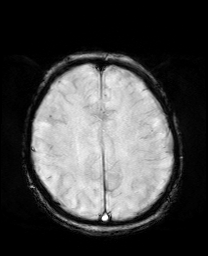
[im 52/52]
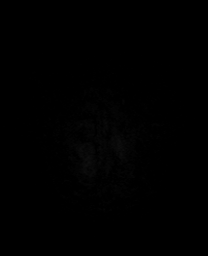

[Series 13: pha_images · axial · 3.0mm · 0.90mm/px · z∈[-88,+62]mm · 4 of 51 slices shown]
[im 1/51]
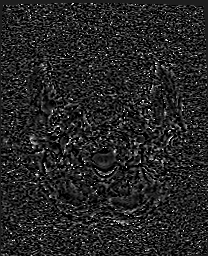
[im 17/51]
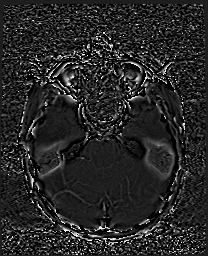
[im 34/51]
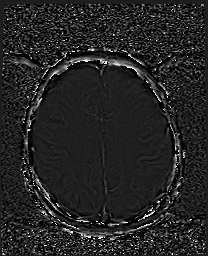
[im 51/51]
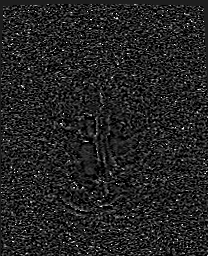

[Series 14: swi_images · axial · 3.0mm · 0.90mm/px · z∈[-91,+62]mm · 4 of 52 slices shown]
[im 1/52]
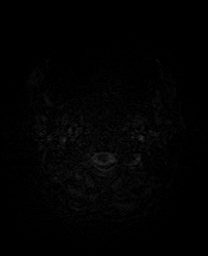
[im 18/52]
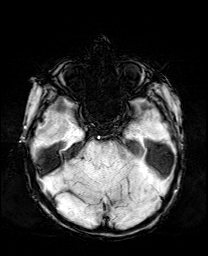
[im 35/52]
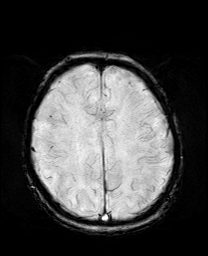
[im 52/52]
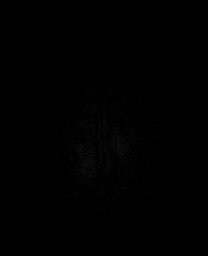

[Series 15: mip_images(sw) · axial · 24.0mm · 0.90mm/px · z∈[-80,+52]mm · 4 of 45 slices shown]
[im 1/45]
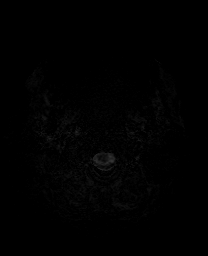
[im 15/45]
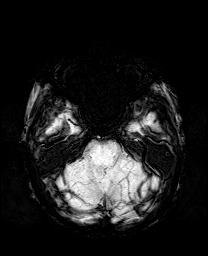
[im 30/45]
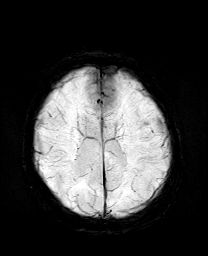
[im 45/45]
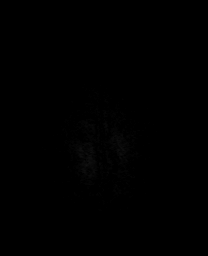

[Series 17: T2 · coronal · 5.0mm · 0.34mm/px · 2 of 29 slices shown (2 of 2)]
[im 1/29]
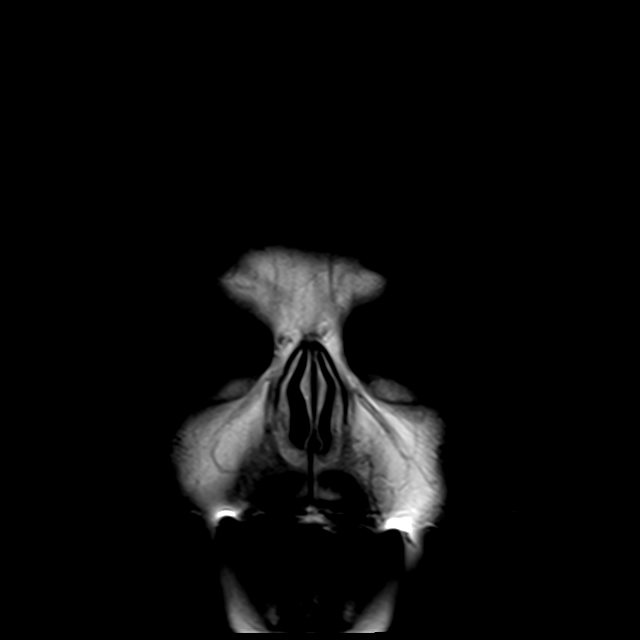
[im 29/29]
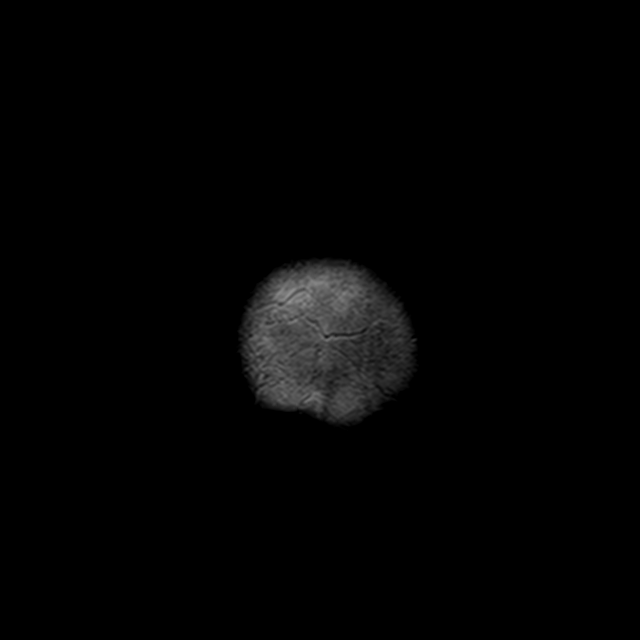

[44 of 48 positions shown; findings below may reference images not displayed]

FINDINGS: Brain: No acute infarct, hemorrhage, or mass lesion is present. Mild
atrophy and white matter changes are likely within normal limits for
age. A remote lacunar infarct subjacent to the anterior right insula
stable.

The ventricles are proportionate to the degree of atrophy.

The internal auditory canals are within normal limits. The brainstem
and cerebellum are within normal limits.

Vascular: Flow is present in the major intracranial arteries.

Skull and upper cervical spine: The craniocervical junction is
normal. Upper cervical spine is within normal limits. Marrow signal
is unremarkable.

Sinuses/Orbits: The paranasal sinuses and mastoid air cells are
clear. Bilateral lens replacements are noted. Globes and orbits are
otherwise unremarkable.
IMPRESSION: 1. No acute intracranial abnormality or significant interval change.
2. Stable atrophy and white matter disease, likely within normal
limits for age.
3. Remote lacunar infarct of the anterior right insula.

## 2022-12-29 ENCOUNTER — Other Ambulatory Visit: Payer: Self-pay

## 2022-12-29 ENCOUNTER — Ambulatory Visit (INDEPENDENT_AMBULATORY_CARE_PROVIDER_SITE_OTHER): Payer: PPO | Admitting: Family Medicine

## 2022-12-29 ENCOUNTER — Encounter: Payer: Self-pay | Admitting: Family Medicine

## 2022-12-29 VITALS — BP 130/80 | HR 72 | Ht 61.0 in | Wt 148.6 lb

## 2022-12-29 DIAGNOSIS — M25552 Pain in left hip: Secondary | ICD-10-CM

## 2022-12-29 DIAGNOSIS — M1612 Unilateral primary osteoarthritis, left hip: Secondary | ICD-10-CM

## 2022-12-29 DIAGNOSIS — G8929 Other chronic pain: Secondary | ICD-10-CM | POA: Diagnosis not present

## 2022-12-29 NOTE — Progress Notes (Signed)
   I, Stevenson Clinch, CMA acting as a scribe for Clementeen Graham, MD.  Maria Lowe is a 89 y.o. female who presents to [facility] today for 68-month f/u chronic L hip pain. Pt was last seen by Dr. Denyse Amass on 10/01/22 for her lumbar radiculopathy and on 08/25/22 was given a L femoral acetabular steroid injection.   Today, pt reports left hip pain, worsening over the past 1-2 weeks. Notes significant improvement s/p steroid injection, also got relief of back and leg pain. Some low back pain. Swelling and erythema in B LE.   Dx imaging: 09/19/2022 L-spine MRI             08/25/2022 L hip XR 08/18/22 L-spine XR   Pertinent review of systems: No fevers or chills  Relevant historical information: Hip arthritis.  History of CLL.   Exam:  BP 130/80   Pulse 72   Ht 5\' 1"  (1.549 m)   Wt 148 lb 9.6 oz (67.4 kg)   SpO2 96%   BMI 28.08 kg/m  General: Well Developed, well nourished, and in no acute distress.   MSK: Left hip: Normal appearing Decreased range of motion.  Pain with flexion.    Lab and Radiology Results  Procedure: Real-time Ultrasound Guided Injection of left hip joint intra-articular injection Device: Philips Affiniti 50G Images permanently stored and available for review in PACS Verbal informed consent obtained.  Discussed risks and benefits of procedure. Warned about infection, bleeding, hyperglycemia damage to structures among others. Patient expresses understanding and agreement Time-out conducted.   Noted no overlying erythema, induration, or other signs of local infection.   Skin prepped in a sterile fashion.   Local anesthesia: Topical Ethyl chloride.   With sterile technique and under real time ultrasound guidance: 40 mg of Kenalog and 2 mL Marcaine injected into hip joint. Fluid seen entering the joint capsule.   Completed without difficulty   Pain immediately resolved suggesting accurate placement of the medication.   Advised to call  if fevers/chills, erythema, induration, drainage, or persistent bleeding.   Images permanently stored and available for review in the ultrasound unit.  Impression: Technically successful ultrasound guided injection.        Assessment and Plan: 89 y.o. female with left hip pain primarily due to hip arthritis.  She has more distal leg pain that could be referred pain from hip arthritis or even lumbar radiculopathy.  The last hip injection performed early December 2023 worked extremely well so were going to repeat that today.  If this injection today does not work as well we may want to consider lumbar radiculopathy as an option and pursue that further.   PDMP not reviewed this encounter. Orders Placed This Encounter  Procedures   [place] LIMITED JOINT SPACE STRUCTURES LOW LEFT(NO LINKED CHARGES)    Order Specific Question:   Reason for Exam (SYMPTOM  OR DIAGNOSIS REQUIRED)    Answer:   left hip pain    Order Specific Question:   Preferred imaging location?    Answer:   [facility]   No orders of the defined types were placed in this encounter.    Discussed warning signs or symptoms. Please see discharge instructions. Patient expresses understanding.   The above documentation has been reviewed and is accurate and complete Clementeen Graham, M.D.

## 2022-12-29 NOTE — Patient Instructions (Addendum)
Thank you for coming in today.   You received an injection today. Seek immediate medical attention if the joint becomes red, extremely painful, or is oozing fluid.   I can repeat this injection every 3 months, if needed.

## 2023-01-07 ENCOUNTER — Telehealth: Payer: Self-pay | Admitting: *Deleted

## 2023-01-07 ENCOUNTER — Ambulatory Visit (INDEPENDENT_AMBULATORY_CARE_PROVIDER_SITE_OTHER): Payer: PPO | Admitting: Family Medicine

## 2023-01-07 ENCOUNTER — Encounter: Payer: Self-pay | Admitting: Family Medicine

## 2023-01-07 VITALS — BP 134/78 | HR 70 | Temp 98.2°F | Ht 61.0 in | Wt 144.6 lb

## 2023-01-07 DIAGNOSIS — D6869 Other thrombophilia: Secondary | ICD-10-CM | POA: Diagnosis not present

## 2023-01-07 DIAGNOSIS — C9110 Chronic lymphocytic leukemia of B-cell type not having achieved remission: Secondary | ICD-10-CM | POA: Diagnosis not present

## 2023-01-07 DIAGNOSIS — I4821 Permanent atrial fibrillation: Secondary | ICD-10-CM | POA: Diagnosis not present

## 2023-01-07 DIAGNOSIS — Z131 Encounter for screening for diabetes mellitus: Secondary | ICD-10-CM | POA: Diagnosis not present

## 2023-01-07 DIAGNOSIS — E785 Hyperlipidemia, unspecified: Secondary | ICD-10-CM

## 2023-01-07 DIAGNOSIS — N183 Chronic kidney disease, stage 3 unspecified: Secondary | ICD-10-CM

## 2023-01-07 DIAGNOSIS — R739 Hyperglycemia, unspecified: Secondary | ICD-10-CM | POA: Diagnosis not present

## 2023-01-07 LAB — COMPREHENSIVE METABOLIC PANEL
ALT: 18 U/L (ref 0–35)
AST: 20 U/L (ref 0–37)
Albumin: 4.6 g/dL (ref 3.5–5.2)
Alkaline Phosphatase: 65 U/L (ref 39–117)
BUN: 26 mg/dL — ABNORMAL HIGH (ref 6–23)
CO2: 31 mEq/L (ref 19–32)
Calcium: 9.4 mg/dL (ref 8.4–10.5)
Chloride: 102 mEq/L (ref 96–112)
Creatinine, Ser: 0.91 mg/dL (ref 0.40–1.20)
GFR: 55.92 mL/min — ABNORMAL LOW (ref >60.00)
Glucose, Bld: 119 mg/dL — ABNORMAL HIGH (ref 70–99)
Potassium: 4.3 mEq/L (ref 3.5–5.1)
Sodium: 141 mEq/L (ref 135–145)
Total Bilirubin: 0.9 mg/dL (ref 0.2–1.2)
Total Protein: 6.1 g/dL (ref 6.0–8.3)

## 2023-01-07 LAB — CBC WITH DIFFERENTIAL/PLATELET
Basophils Absolute: 0.1 10*3/uL (ref 0.0–0.1)
Basophils Relative: 0.2 % (ref 0.0–3.0)
Eosinophils Absolute: 0.1 10*3/uL (ref 0.0–0.7)
Eosinophils Relative: 0.4 % (ref 0.0–5.0)
HCT: 42.4 % (ref 36.0–46.0)
Hemoglobin: 13.8 g/dL (ref 12.0–15.0)
Lymphocytes Relative: 78.8 % — ABNORMAL HIGH (ref 12.0–46.0)
Lymphs Abs: 23.9 10*3/uL — ABNORMAL HIGH (ref 0.7–4.0)
MCHC: 32.6 g/dL (ref 30.0–36.0)
MCV: 89.4 fl (ref 78.0–100.0)
Monocytes Absolute: 0.8 10*3/uL (ref 0.1–1.0)
Monocytes Relative: 2.8 % — ABNORMAL LOW (ref 3.0–12.0)
Neutro Abs: 5.4 10*3/uL (ref 1.4–7.7)
Neutrophils Relative %: 17.8 % — ABNORMAL LOW (ref 43.0–77.0)
Platelets: 128 10*3/uL — ABNORMAL LOW (ref 150.0–400.0)
RBC: 4.74 Mil/uL (ref 3.87–5.11)
RDW: 15.6 % — ABNORMAL HIGH (ref 11.5–15.5)
WBC: 30.3 10*3/uL (ref 4.0–10.5)

## 2023-01-07 LAB — LIPID PANEL
Cholesterol: 171 mg/dL (ref 0–200)
HDL: 67.7 mg/dL (ref >39.00)
LDL Cholesterol: 81 mg/dL (ref 0–99)
NonHDL: 103.57
Total CHOL/HDL Ratio: 3
Triglycerides: 111 mg/dL (ref 0.0–149.0)
VLDL: 22.2 mg/dL (ref 0.0–40.0)

## 2023-01-07 LAB — HEMOGLOBIN A1C: Hgb A1c MFr Bld: 6 % (ref 4.6–6.5)

## 2023-01-07 NOTE — Progress Notes (Signed)
Phone [phone] In person visit   Subjective:   Maria Lowe is a 89 y.o. year old very pleasant female patient who presents for/with See problem oriented charting Chief Complaint  Patient presents with   Hypertension   Hyperlipidemia    Past Medical History-  Patient Active Problem List   Diagnosis Date Noted   Persistent atrial fibrillation 11/29/2020    Priority: High   Thrombocytopenia (HCC) 11/26/2014    Priority: High   CLL (chronic lymphocytic leukemia) (HCC) 11/27/2013    Priority: High   Hyperlipidemia, unspecified 04/07/2018    Priority: Medium    CKD (chronic kidney disease), stage III 02/09/2018    Priority: Medium    Hyperglycemia 05/08/2013    Priority: Medium    Essential hypertension, benign 11/07/2012    Priority: Medium    H/O rectocele repair 08/25/2021    Priority: Low   Secondary hypercoagulable state 12/10/2020    Priority: Low   Rash 02/09/2018    Priority: Low   Seborrheic dermatitis 01/08/2017    Priority: Low   Former smoker 09/30/2015    Priority: Low   Rectocele 03/29/2015    Priority: Low   Obesity, unspecified 11/07/2012    Priority: Low   Acute left-sided low back pain with left-sided sciatica 06/25/2022    Medications- reviewed and updated Current Outpatient Medications  Medication Sig Dispense Refill   amLODipine (NORVASC) 10 MG tablet TAKE 1 TABLET BY MOUTH EVERY DAY 90 tablet 3   atenolol (TENORMIN) 100 MG tablet TAKE 1 TABLET BY MOUTH EVERY DAY 90 tablet 3   ELIQUIS 5 MG TABS tablet TAKE 1 TABLET BY MOUTH TWICE A DAY 60 tablet 5   gabapentin (NEURONTIN) 100 MG capsule Take 1 capsule (100 mg total) by mouth at bedtime as needed (nerve pain). 60 capsule 3   hydrochlorothiazide (HYDRODIURIL) 25 MG tablet TAKE 1 TABLET BY MOUTH EVERY DAY 90 tablet 3   losartan (COZAAR) 100 MG tablet TAKE 1 TABLET BY MOUTH EVERY DAY 90 tablet 3   meclizine (ANTIVERT) 25 MG tablet Take 1 tablet (25 mg total) by mouth 3 (three) times daily as  needed for dizziness. 30 tablet 0   Multiple Vitamins-Minerals (ONE-A-DAY WOMENS 50+ PO) Take by mouth daily.     rosuvastatin (CRESTOR) 5 MG tablet Take 1 tablet (5 mg total) by mouth once a week. 13 tablet 3   No current facility-administered medications for this visit.     Objective:  BP 134/78 (BP Location: Left Arm, Patient Position: Sitting)   Pulse 70   Temp 98.2 F (36.8 C) (Temporal)   Ht  (1.549 m)   Wt 144 lb 9.6 oz (65.6 kg)   SpO2 98%   BMI 27.32 kg/m  Gen: NAD, resting comfortably CV: RRR no murmurs rubs or gallops Lungs: CTAB no crackles, wheeze, rhonchi Abdomen: soft/nontender/nondistended/normal bowel sounds. No rebound or guarding.  Ext: no edema Skin: warm, dry, left cheek about 2 cm x 1 cm erythematous lesion with some scaling  Procedure note: Benefits and risks verbally discussed with patient 10 second freeze thaw cycle of cryotherapy performed  with liquid nitrogen to left cheeck actinic keratosis  No complications.  Patient tolerated the procedure well other than mild pain. Gave handout on this from epic and discussed benefits/risks before procedure.      Assessment and Plan   # Hip arthritis-left hip pain with recent injection by Dr. Santiago Bur raise blood sugar levels/A1c- Also some question of lumbar radiculopathy.  2nd injection  total. She reports:significant relief.    #Actinic Keratosis - left face- improved with prior cryotherapy but came back- advised referral to dermatology- she prefers to hold off on referral- and do 1 more attempted cryotherapy today- if fails to improve or recurs again- discussed would really need to refer.   #Permanent atrial fibrillation with secondary hypercoagulable state (noted) as a result S: Medication: Eliquis 5 mg twice daily, plan is for rate control strategy and she is on atenolol 100 mg -There was evidence of a remote lacunar infarct when she was seen in the emergency department in January 2023-could be  related to prior A-fib A/P:  appropriately anticoagulated and rate controlled- continue current medicine  #hypertension S: medication: Amlodipine , Atenolol , HCTZ , losartan  Home readings #s: usually similar or less compared to today's readings BP Readings from Last 3 Encounters:  01/07/23 134/78  12/29/22 130/80  10/01/22 138/78  A/P: stable/controlled for age- continue current medicines    #Chronic kidney disease stage III S: GFR is typically in the 50srange-most recently at 29 in October -Patient knows to avoid NSAIDs  A/P: Hopefully stable or improved- update CMP with labs today. Continue without meds for now.  #CLL- per patient  Dr. Bertis Ruddy released her- had been stable.    Also has thrombocytopenia related to CLL  -We will monitor with CBC today-presumed stable  #hyperlipidemia S: Medication:Rosuvastatin 5 mg weekly- only took 4 weeks and hasn't had since then. Patient prefers to remain off statin despite remote lacunar infarct on prior imaging January 2023 but later agreed to at least pulsed dose therapy Lab Results  Component Value Date   CHOL 165 12/25/2021   HDL 65.20 12/25/2021   LDLCALC 79 12/25/2021   LDLDIRECT 81.0 06/25/2022   TRIG 103.0 12/25/2021   CHOLHDL 3 12/25/2021    A/P: Hopefully cholesterol is improved  (but not currently on planned  pulsed dose therapy)-would prefer LDL under 70-check full lipid panel today- if above goal we will resend and restart medicine.   # Hyperglycemia/insulin resistance/prediabetes- a1c as high as 6 S:  Medication: None Exercise and diet- trying to be active and does some some chair exercises. Down 4 lbs Lab Results  Component Value Date   HGBA1C 6.1 06/25/2022   HGBA1C 6.2 12/25/2021   HGBA1C 5.9 11/29/2020  A/P: Mildly improved on last check-update A1c today  Recommended follow up: Return in about 7 months (around 08/09/2023) for physical or sooner if needed.Schedule b4 you leave. Future Appointments   Date Time Provider Department Center  12/14/2023  3:00 PM Erroll Luna, [facility] None   Lab/Order associations: 1 piece of toast and milk in her coffee   ICD-10-CM   1. CLL (chronic lymphocytic leukemia) Chronic C91.10     2. Permanent atrial fibrillation Chronic I48.21     3. Secondary hypercoagulable state Chronic D68.69     4. Stage 3 chronic kidney disease, unspecified whether stage 3a or 3b CKD Chronic N18.30     5. Hyperlipidemia, unspecified hyperlipidemia type  E78.5 CBC with Differential/Platelet    Comprehensive metabolic panel    Lipid panel    6. Hyperglycemia  R73.9 HgB A1c    7. Screening for diabetes mellitus  Z13.1 HgB A1c     No orders of the defined types were placed in this encounter.  Return precautions advised.  Tana Conch, MD

## 2023-01-07 NOTE — Telephone Encounter (Signed)
CRITICAL VALUE STICKER  CRITICAL VALUE: WBC 30.3  RECEIVER (on-site recipient of call): Zoe Lan, RN  DATE & TIME NOTIFIED: 01/07/23 at 11:15  MESSENGER (representative from lab): Clydie Braun  MD NOTIFIED: Dr. Durene Cal  TIME OF NOTIFICATION: 11:16  RESPONSE:  lab increased from last time, he may refer her to Hematology. But not urgent.

## 2023-01-07 NOTE — Patient Instructions (Addendum)
Please check with your pharmacy to see if they have the shingrix vaccine. If they do- please get this immunization and update [place] by phone call or mychart with dates you receive the vaccine  Cryotherapy today before you leave for left cheek- if this recurs or does not resolve please let me place a referral to dermatology for you  Please stop by lab before you go If you have mychart- we will send your results within 3 business days of [place] receiving them.  If you do not have mychart- we will call you about results within 5 business days of [place] receiving them.  *please also note that you will see labs on mychart as soon as they post. I will later go in and write notes on them- will say "notes from Dr. Durene Cal"   Recommended follow up: Return in about 7 months (around 08/09/2023) for physical or sooner if needed.Schedule b4 you leave.

## 2023-01-07 NOTE — Telephone Encounter (Signed)
See result note- plan to get her plugged back in with hematology if she is willing

## 2023-01-11 ENCOUNTER — Telehealth: Payer: Self-pay | Admitting: Hematology and Oncology

## 2023-01-11 ENCOUNTER — Other Ambulatory Visit: Payer: Self-pay

## 2023-01-11 DIAGNOSIS — C9110 Chronic lymphocytic leukemia of B-cell type not having achieved remission: Secondary | ICD-10-CM

## 2023-01-11 NOTE — Telephone Encounter (Signed)
Spoke with patient confirming upcoming appointment  

## 2023-01-28 ENCOUNTER — Other Ambulatory Visit: Payer: Self-pay

## 2023-01-28 ENCOUNTER — Encounter: Payer: Self-pay | Admitting: Hematology and Oncology

## 2023-01-28 ENCOUNTER — Inpatient Hospital Stay: Payer: PPO | Attending: Hematology and Oncology | Admitting: Hematology and Oncology

## 2023-01-28 VITALS — BP 152/83 | HR 81 | Temp 98.2°F | Resp 18 | Wt 144.4 lb

## 2023-01-28 DIAGNOSIS — C9110 Chronic lymphocytic leukemia of B-cell type not having achieved remission: Secondary | ICD-10-CM | POA: Diagnosis not present

## 2023-01-28 DIAGNOSIS — D696 Thrombocytopenia, unspecified: Secondary | ICD-10-CM

## 2023-01-28 NOTE — Progress Notes (Signed)
[facility] OFFICE PROGRESS NOTE  Patient Care Team: Shelva Majestic, MD as PCP - General (Family Medicine) Artis Delay, MD as Consulting Physician (Hematology and Oncology) Erroll Luna, [facility] (Pharmacist)  ASSESSMENT & PLAN:  CLL (chronic lymphocytic leukemia) (HCC) We reviewed her test results Clinically, she is not symptomatic Her lymphocyte doubling time is almost 1 year with no changes to her red blood count.  Her platelet count is stable There is no indications to treat I will see her once a year  Thrombocytopenia (HCC) This is related to CLL but she is not symptomatic.  Continue observation only. The patient is educated to watch out for signs and symptoms of bleeding There is no contraindication for her to remain on Eliquis as long as her platelet count is over 50,000  No orders of the defined types were placed in this encounter.   All questions were answered. The patient knows to call the clinic with any problems, questions or concerns. The total time spent in the appointment was 20 minutes encounter with patients including review of chart and various tests results, discussions about plan of care and coordination of care plan   Artis Delay, MD 01/28/2023 10:34 AM  INTERVAL HISTORY: Please see below for problem oriented charting. she returns for follow-up for CLL She is here accompanied by family She is doing well She had minimum weight loss over the last 2 years due to gradual decline overall with activity but she remains with good appetite She is very functional at home and cooks all her meals She denies bleeding from anticoagulation therapy We reviewed recent blood test results She denies new lymphadenopathy  REVIEW OF SYSTEMS:   Constitutional: Denies fevers, chills or abnormal weight loss Eyes: Denies blurriness of vision Ears, nose, mouth, throat, and face: Denies mucositis or sore throat Respiratory: Denies cough, dyspnea or  wheezes Cardiovascular: Denies palpitation, chest discomfort or lower extremity swelling Gastrointestinal:  Denies nausea, heartburn or change in bowel habits Skin: Denies abnormal skin rashes Lymphatics: Denies new lymphadenopathy or easy bruising Neurological:Denies numbness, tingling or new weaknesses Behavioral/Psych: Mood is stable, no new changes  All other systems were reviewed with the patient and are negative.  I have reviewed the past medical history, past surgical history, social history and family history with the patient and they are unchanged from previous note.  ALLERGIES:  has No Known Allergies.  MEDICATIONS:  Current Outpatient Medications  Medication Sig Dispense Refill   amLODipine (NORVASC) 10 MG tablet TAKE 1 TABLET BY MOUTH EVERY DAY 90 tablet 3   atenolol (TENORMIN) 100 MG tablet TAKE 1 TABLET BY MOUTH EVERY DAY 90 tablet 3   ELIQUIS 5 MG TABS tablet TAKE 1 TABLET BY MOUTH TWICE A DAY 60 tablet 5   gabapentin (NEURONTIN) 100 MG capsule Take 1 capsule (100 mg total) by mouth at bedtime as needed (nerve pain). 60 capsule 3   hydrochlorothiazide (HYDRODIURIL) 25 MG tablet TAKE 1 TABLET BY MOUTH EVERY DAY 90 tablet 3   losartan (COZAAR) 100 MG tablet TAKE 1 TABLET BY MOUTH EVERY DAY 90 tablet 3   meclizine (ANTIVERT) 25 MG tablet Take 1 tablet (25 mg total) by mouth 3 (three) times daily as needed for dizziness. 30 tablet 0   Multiple Vitamins-Minerals (ONE-A-DAY WOMENS 50+ PO) Take by mouth daily.     rosuvastatin (CRESTOR) 5 MG tablet Take 1 tablet (5 mg total) by mouth once a week. 13 tablet 3   No current facility-administered medications for  this visit.    SUMMARY OF ONCOLOGIC HISTORY: Oncology History  CLL (chronic lymphocytic leukemia) (HCC)  11/27/2013 Initial Diagnosis   CLL (chronic lymphocytic leukemia) (HCC)   06/17/2021 Cancer Staging   Staging form: Chronic Lymphocytic Leukemia / Small Lymphocytic Lymphoma, AJCC 8th Edition - Clinical stage from  06/17/2021: Modified Rai Stage 0 (Modified Rai risk: Low, Lymphocytosis: Present, Adenopathy: Absent, Organomegaly: Absent, Anemia: Absent, Thrombocytopenia: Absent) - Signed by Artis Delay, MD on 06/17/2021 Stage prefix: Initial diagnosis     PHYSICAL EXAMINATION: ECOG PERFORMANCE STATUS: 0 - Asymptomatic  Vitals:   01/28/23 0910  BP: (!) 152/83  Pulse: 81  Resp: 18  Temp: 98.2 F (36.8 C)  SpO2: 97%   Filed Weights   01/28/23 0910  Weight: 144 lb 6.4 oz (65.5 kg)    GENERAL:alert, no distress and comfortable SKIN: skin color, texture, turgor are normal, no rashes or significant lesions EYES: normal, Conjunctiva are pink and non-injected, sclera clear OROPHARYNX:no exudate, no erythema and lips, buccal mucosa, and tongue normal  NECK: supple, thyroid normal size, non-tender, without nodularity LYMPH:  no palpable lymphadenopathy in the cervical, axillary or inguinal LUNGS: clear to auscultation and percussion with normal breathing effort HEART: regular rate & rhythm and no murmurs and no lower extremity edema ABDOMEN:abdomen soft, non-tender and normal bowel sounds Musculoskeletal:no cyanosis of digits and no clubbing  NEURO: alert & oriented x 3 with fluent speech, no focal motor/sensory deficits  LABORATORY DATA:  I have reviewed the data as listed    Component Value Date/Time   NA 141 01/07/2023 0847   NA 141 11/28/2015 0959   K 4.3 01/07/2023 0847   K 4.3 11/28/2015 0959   CL 102 01/07/2023 0847   CO2 31 01/07/2023 0847   CO2 27 11/28/2015 0959   GLUCOSE 119 (H) 01/07/2023 0847   GLUCOSE 113 11/28/2015 0959   BUN 26 (H) 01/07/2023 0847   BUN 23.6 11/28/2015 0959   CREATININE 0.91 01/07/2023 0847   CREATININE 0.89 (H) 05/30/2020 1419   CREATININE 1.0 11/28/2015 0959   CALCIUM 9.4 01/07/2023 0847   CALCIUM 9.5 11/28/2015 0959   PROT 6.1 01/07/2023 0847   PROT 6.6 11/28/2015 0959   ALBUMIN 4.6 01/07/2023 0847   ALBUMIN 4.2 11/28/2015 0959   AST 20 01/07/2023  0847   AST 17 11/28/2015 0959   ALT 18 01/07/2023 0847   ALT 15 11/28/2015 0959   ALKPHOS 65 01/07/2023 0847   ALKPHOS 72 11/28/2015 0959   BILITOT 0.9 01/07/2023 0847   BILITOT 0.64 11/28/2015 0959   GFRNONAA 56 (L) 10/21/2021 1940   GFRNONAA 59 (L) 05/30/2020 1419   GFRAA 68 05/30/2020 1419    No results found for: "SPEP", "UPEP"  Lab Results  Component Value Date   WBC 30.3 Repeated and verified X2. (HH) 01/07/2023   NEUTROABS 5.4 01/07/2023   HGB 13.8 01/07/2023   HCT 42.4 01/07/2023   MCV 89.4 01/07/2023   PLT 128.0 (L) 01/07/2023      Chemistry      Component Value Date/Time   NA 141 01/07/2023 0847   NA 141 11/28/2015 0959   K 4.3 01/07/2023 0847   K 4.3 11/28/2015 0959   CL 102 01/07/2023 0847   CO2 31 01/07/2023 0847   CO2 27 11/28/2015 0959   BUN 26 (H) 01/07/2023 0847   BUN 23.6 11/28/2015 0959   CREATININE 0.91 01/07/2023 0847   CREATININE 0.89 (H) 05/30/2020 1419   CREATININE 1.0 11/28/2015 0959  Component Value Date/Time   CALCIUM 9.4 01/07/2023 0847   CALCIUM 9.5 11/28/2015 0959   ALKPHOS 65 01/07/2023 0847   ALKPHOS 72 11/28/2015 0959   AST 20 01/07/2023 0847   AST 17 11/28/2015 0959   ALT 18 01/07/2023 0847   ALT 15 11/28/2015 0959   BILITOT 0.9 01/07/2023 0847   BILITOT 0.64 11/28/2015 0959

## 2023-01-28 NOTE — Assessment & Plan Note (Signed)
We reviewed her test results Clinically, she is not symptomatic Her lymphocyte doubling time is almost 1 year with no changes to her red blood count.  Her platelet count is stable There is no indications to treat I will see her once a year

## 2023-01-28 NOTE — Assessment & Plan Note (Signed)
This is related to CLL but she is not symptomatic.  Continue observation only. The patient is educated to watch out for signs and symptoms of bleeding There is no contraindication for her to remain on Eliquis as long as her platelet count is over 50,000 

## 2023-02-11 ENCOUNTER — Ambulatory Visit (INDEPENDENT_AMBULATORY_CARE_PROVIDER_SITE_OTHER): Payer: PPO | Admitting: Physician Assistant

## 2023-02-11 ENCOUNTER — Encounter: Payer: Self-pay | Admitting: Physician Assistant

## 2023-02-11 VITALS — BP 130/72 | HR 88 | Temp 97.7°F | Ht 61.0 in | Wt 144.6 lb

## 2023-02-11 DIAGNOSIS — M542 Cervicalgia: Secondary | ICD-10-CM | POA: Diagnosis not present

## 2023-02-11 MED ORDER — PREDNISONE 50 MG PO TABS
ORAL_TABLET | ORAL | 0 refills | Status: DC
Start: 2023-02-11 — End: 2023-05-18

## 2023-02-11 NOTE — Progress Notes (Signed)
Subjective:    Patient ID: Maria Lowe, female    DOB: June 29, 1931, 89 y.o.   MRN: [id number]  Chief Complaint  Patient presents with   Neck Pain    Pt in office for Neck pain; pt has been experiencing the lrft side neck pain since last Wednesday, this has happened previously a few years ago and took Prednisone and resolved symptoms.     HPI Patient is in today for left sided neck pain x 1 week. She is here with her daughter. No known injury. States it feels irritated from top of her shoulder and along the left neck. No issues with range of motion. Similar event in 2021 - cervical xray showed degenerative changes; treated with prednisone 50 mg x 7 days and symptoms resolved. She is requesting this treatment again today.  Denies any HA or dizziness. No numbness going down the arm. No other symptoms.   Past Medical History:  Diagnosis Date   Anticoagulant long-term use    eliquis-- managed by cardiology   Arthritis    knees   CKD (chronic kidney disease), stage III (HCC)    CLL (chronic lymphocytic leukemia) (HCC) 11/27/2013   oncologist-- dr Bertis Ruddy;  initial dx 03/ 2015, no treatment, active survillance   Essential hypertension    followed by pcp   Full dentures    History of gastric ulcer    stomach ulcer when young   Lymphocytosis 2009   followed oncology   Permanent atrial fibrillation (HCC) 11/2020   cardiologist-- primary dr w. Flora Lipps and followed by atrial clinic;    dx 003/ 2022, atenolol for rate control and on eliquis   Rectocele    Thrombocythemia 2009   related to CLL, followed by dr Bertis Ruddy   Urinary incontinence    Vaginal atrophy     Past Surgical History:  Procedure Laterality Date   APPENDECTOMY     1980s   CATARACT EXTRACTION W/ INTRAOCULAR LENS IMPLANT Bilateral 2020   RECTOCELE REPAIR N/A 08/25/2021   Procedure: POSTERIOR REPAIR (RECTOCELE);  Surgeon: Romualdo Bolk, MD;  Location: [facility];  Service: Gynecology;   Laterality: N/A;   TONSILLECTOMY AND ADENOIDECTOMY     age 89    Family History  Problem Relation Age of Onset   Healthy Mother        died age 57 (communist country could not get records)   Other Father        died young-unclear cause   Other Sister        died in 67 "old age. hard to get answers in [place]   Other Sister        22,  hard to get answers in [place]- possible cancer- rectal or colorectal?   Other Sister        died at 32- unclear cause    Social History   Tobacco Use   Smoking status: Former    Years: 20    Types: Cigarettes    Quit date: 1980    Years since quitting: 44.4   Smokeless tobacco: Never  Vaping Use   Vaping Use: Never used  Substance Use Topics   Alcohol use: Not Currently   Drug use: Never     No Known Allergies  Review of Systems NEGATIVE UNLESS OTHERWISE INDICATED IN HPI      Objective:     BP 130/72 (BP Location: Right Arm)   Pulse 88   Temp 97.7 F (36.5 C) (Temporal)  Ht 5\' 1"  (1.549 m)   Wt 144 lb 9.6 oz (65.6 kg)   SpO2 96%   BMI 27.32 kg/m   Wt Readings from Last 3 Encounters:  02/11/23 144 lb 9.6 oz (65.6 kg)  01/28/23 144 lb 6.4 oz (65.5 kg)  01/07/23 144 lb 9.6 oz (65.6 kg)    BP Readings from Last 3 Encounters:  02/11/23 130/72  01/28/23 (!) 152/83  01/07/23 134/78     Physical Exam Vitals and nursing note reviewed.  Constitutional:      General: She is not in acute distress.    Appearance: Normal appearance. She is not ill-appearing.  Cardiovascular:     Rate and Rhythm: Normal rate and regular rhythm.  Pulmonary:     Effort: Pulmonary effort is normal.     Breath sounds: Normal breath sounds.  Musculoskeletal:     Cervical back: Normal range of motion. Tenderness (minimal TTP along left paracervical muscles) present. No rigidity.  Lymphadenopathy:     Cervical: No cervical adenopathy.  Neurological:     Mental Status: She is alert.        Assessment & Plan:  Neck pain on left side -      predniSONE; Take once daily with breakfast.  Dispense: 7 tablet; Refill: 0  -Reviewed her previous XRAY from 2021 and note with Dr. Durene Cal; similar flare of arthritis / nerve pain as before. No red flags on exam. Treat with prednisone as directed. Heat / thermacare patches for relief. Consider ortho or sports med prn. Pt agreeable and understanding of tx plan.     Chelsey Kimberley M Cova Knieriem, PA-C

## 2023-04-06 ENCOUNTER — Other Ambulatory Visit: Payer: Self-pay

## 2023-04-06 ENCOUNTER — Ambulatory Visit: Payer: PPO | Admitting: Family Medicine

## 2023-04-06 VITALS — BP 138/80 | HR 61 | Ht 61.0 in | Wt 148.0 lb

## 2023-04-06 DIAGNOSIS — G8929 Other chronic pain: Secondary | ICD-10-CM | POA: Diagnosis not present

## 2023-04-06 DIAGNOSIS — M25552 Pain in left hip: Secondary | ICD-10-CM

## 2023-04-06 NOTE — Patient Instructions (Addendum)
Thank you for coming in today.   You received an injection today. Seek immediate medical attention if the joint becomes red, extremely painful, or is oozing fluid.   Check back as needed  I can repeat this injection in 3 months if needed

## 2023-04-06 NOTE — Progress Notes (Signed)
   Rubin Payor, PhD, LAT, ATC acting as a scribe for Clementeen Graham, MD.  Maria Lowe is a 89 y.o. female who presents to [facility] today for cont'd L leg pain. Pt was last seen by Dr. Denyse Amass on 12/29/22 and was given a L intra-articular hip steroid injection.  Today, pt reports L leg pain returned about 2 wks ago, slowly worsening. Pt locates pain to the L groin radiating distally along the whole L leg. No numbness/tingling noted.   Dx imaging: 09/19/2022 L-spine MRI             08/25/2022 L hip XR 08/18/22 L-spine XR  Pertinent review of systems: No fevers or chills  Relevant historical information: CLL   Exam:  BP 138/80   Pulse 61   Ht 5\' 1"  (1.549 m)   Wt 148 lb (67.1 kg)   SpO2 95%   BMI 27.96 kg/m  General: Well Developed, well nourished, and in no acute distress.   MSK: Left hip normal-appearing normal motion pain with flexion.    Lab and Radiology Results  Procedure: Real-time Ultrasound Guided  left hip intra-articular joint injection Device: Philips Affiniti 50G/GE Logiq Images permanently stored and available for review in PACS Verbal informed consent obtained.  Discussed risks and benefits of procedure. Warned about infection, bleeding, hyperglycemia damage to structures among others. Patient expresses understanding and agreement Time-out conducted.   Noted no overlying erythema, induration, or other signs of local infection.   Skin prepped in a sterile fashion.   Local anesthesia: Topical Ethyl chloride.   With sterile technique and under real time ultrasound guidance: 40 mg of Kenalog and 2 mL of Marcaine injected into hip joint. Fluid seen entering the joint capsule.   Completed without difficulty   Pain immediately resolved suggesting accurate placement of the medication.   Advised to call if fevers/chills, erythema, induration, drainage, or persistent bleeding.   Images permanently stored and available for review in  the ultrasound unit.  Impression: Technically successful ultrasound guided injection.        Assessment and Plan: 89 y.o. female with left leg pain thought to be predominantly due to intra-articular hip etiology and DJD.  She may have a lumbar radicular component as well.  However she is done extremely well with intermittent injections in the left hip.  Last injection was about 3 months ago and before that 4 months before then.  The shots seem to be working okay.  Plan to proceed with repeat hip injection today.  We could do it again as soon as 3 months which would be mid October.   PDMP not reviewed this encounter. Orders Placed This Encounter  Procedures   [place] LIMITED JOINT SPACE STRUCTURES LOW LEFT(NO LINKED CHARGES)    Order Specific Question:   Reason for Exam (SYMPTOM  OR DIAGNOSIS REQUIRED)    Answer:   left hip pain    Order Specific Question:   Preferred imaging location?    Answer:   [facility]   No orders of the defined types were placed in this encounter.    Discussed warning signs or symptoms. Please see discharge instructions. Patient expresses understanding.   The above documentation has been reviewed and is accurate and complete Clementeen Graham, M.D.

## 2023-05-04 ENCOUNTER — Other Ambulatory Visit: Payer: Self-pay | Admitting: Family Medicine

## 2023-05-18 ENCOUNTER — Ambulatory Visit (INDEPENDENT_AMBULATORY_CARE_PROVIDER_SITE_OTHER): Payer: PPO

## 2023-05-18 VITALS — Wt 148.0 lb

## 2023-05-18 DIAGNOSIS — Z0000 Encounter for general adult medical examination without abnormal findings: Secondary | ICD-10-CM | POA: Diagnosis not present

## 2023-05-18 NOTE — Patient Instructions (Signed)
Maria Lowe , Thank you for taking time to come for your Medicare Wellness Visit. I appreciate your ongoing commitment to your health goals. Please review the following plan we discussed and let me know if I can assist you in the future.   Referrals/Orders/Follow-Ups/Clinician Recommendations: continue to live well living well  This is a list of the screening recommended for you and due dates:  Health Maintenance  Topic Date Due   Zoster (Shingles) Vaccine (1 of 2) Never done   COVID-19 Vaccine (7 - 2023-24 season) 09/03/2022   Flu Shot  04/22/2023   DEXA scan (bone density measurement)  10/23/2043*   Medicare Annual Wellness Visit  05/17/2024   DTaP/Tdap/Td vaccine (2 - Tdap) 04/07/2028   Pneumonia Vaccine  Completed   HPV Vaccine  Aged Out  *Topic was postponed. The date shown is not the original due date.    Advanced directives: (Declined) Advance directive discussed with you today. Even though you declined this today, please call our office should you change your mind, and we can give you the proper paperwork for you to fill out.  Next Medicare Annual Wellness Visit scheduled for next year: Yes

## 2023-05-18 NOTE — Progress Notes (Addendum)
Subjective:   Maria Lowe is a 89 y.o. female who presents for Medicare Annual (Subsequent) preventive examination.  Visit Complete: Virtual  I connected with  Miel S Kunka on 05/18/23 by a audio enabled telemedicine application and verified that I am speaking with the correct person using two identifiers.  Patient Location: Home  Provider Location: Office/Clinic  I discussed the limitations of evaluation and management by telemedicine. The patient expressed understanding and agreed to proceed.   Vital Signs: Unable to obtain new vitals due to this being a telehealth visit.   Review of Systems     Cardiac Risk Factors include: advanced age (>81men, >4 women);dyslipidemia;hypertension     Objective:    Today's Vitals   05/18/23 1326  Weight: 148 lb (67.1 kg)   Body mass index is 27.96 kg/m.     05/18/2023    1:30 PM 07/03/2022   10:11 AM 09/20/2021   12:32 PM 08/25/2021    9:19 AM 06/20/2021    9:39 AM 05/30/2021   10:25 AM 06/17/2020    9:40 AM  Advanced Directives  Does Patient Have a Medical Advance Directive? No No No No No No No  Would patient like information on creating a medical advance directive? No - Patient declined   No - Patient declined No - Patient declined  No - Patient declined    Current Medications (verified) Outpatient Encounter Medications as of 05/18/2023  Medication Sig   amLODipine (NORVASC) 10 MG tablet TAKE 1 TABLET BY MOUTH EVERY DAY   atenolol (TENORMIN) 100 MG tablet TAKE 1 TABLET BY MOUTH EVERY DAY   ELIQUIS 5 MG TABS tablet TAKE 1 TABLET BY MOUTH TWICE A DAY   gabapentin (NEURONTIN) 100 MG capsule Take 1 capsule (100 mg total) by mouth at bedtime as needed (nerve pain).   hydrochlorothiazide (HYDRODIURIL) 25 MG tablet TAKE 1 TABLET BY MOUTH EVERY DAY   losartan (COZAAR) 100 MG tablet TAKE 1 TABLET BY MOUTH EVERY DAY   meclizine (ANTIVERT) 25 MG tablet Take 1 tablet (25 mg total) by mouth 3 (three) times daily as needed for  dizziness.   Multiple Vitamins-Minerals (ONE-A-DAY WOMENS 50+ PO) Take by mouth daily.   rosuvastatin (CRESTOR) 5 MG tablet Take 1 tablet (5 mg total) by mouth once a week.   [DISCONTINUED] predniSONE (DELTASONE) 50 MG tablet Take once daily with breakfast. (Patient not taking: Reported on 04/06/2023)   No facility-administered encounter medications on file as of 05/18/2023.    Allergies (verified) Patient has no known allergies.   History: Past Medical History:  Diagnosis Date   Anticoagulant long-term use    eliquis-- managed by cardiology   Arthritis    knees   CKD (chronic kidney disease), stage III (HCC)    CLL (chronic lymphocytic leukemia) (HCC) 11/27/2013   oncologist-- dr Bertis Ruddy;  initial dx 03/ 2015, no treatment, active survillance   Essential hypertension    followed by pcp   Full dentures    History of gastric ulcer    stomach ulcer when young   Lymphocytosis 2009   followed oncology   Permanent atrial fibrillation (HCC) 11/2020   cardiologist-- primary dr w. Flora Lipps and followed by atrial clinic;    dx 003/ 2022, atenolol for rate control and on eliquis   Rectocele    Thrombocythemia 2009   related to CLL, followed by dr Bertis Ruddy   Urinary incontinence    Vaginal atrophy    Past Surgical History:  Procedure Laterality Date  APPENDECTOMY     1980s   CATARACT EXTRACTION W/ INTRAOCULAR LENS IMPLANT Bilateral 2020   RECTOCELE REPAIR N/A 08/25/2021   Procedure: POSTERIOR REPAIR (RECTOCELE);  Surgeon: Romualdo Bolk, MD;  Location: [facility];  Service: Gynecology;  Laterality: N/A;   TONSILLECTOMY AND ADENOIDECTOMY     age 89   Family History  Problem Relation Age of Onset   Healthy Mother        died age 71 (communist country could not get records)   Other Father        died young-unclear cause   Other Sister        died in 73 "old age. hard to get answers in [place]   Other Sister        56,  hard to get answers in [place]- possible  cancer- rectal or colorectal?   Other Sister        died at 60- unclear cause   Social History   Socioeconomic History   Marital status: Widowed    Spouse name: Not on file   Number of children: 3   Years of education: Not on file   Highest education level: Not on file  Occupational History   Occupation: retired  Tobacco Use   Smoking status: Former    Current packs/day: 0.00    Types: Cigarettes    Start date: 1960    Quit date: 1980    Years since quitting: 44.6   Smokeless tobacco: Never  Vaping Use   Vaping status: Never Used  Substance and Sexual Activity   Alcohol use: Not Currently   Drug use: Never   Sexual activity: Not on file  Other Topics Concern   Not on file  Social History Narrative   Lives alone- cooks Sunday dinner, cares for home and in good shape. Widowed April 2016. 3 children. 7 grandkids. 3 greatgrandkids.   Husband worked as Clinical research associate.       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      Hobbies: travel- husband was buried at home ([place])   Social Determinants of Health   Financial Resource Strain: Low Risk  (05/18/2023)   Overall Financial Resource Strain (CARDIA)    Difficulty of Paying Living Expenses: Not hard at all  Food Insecurity: No Food Insecurity (05/18/2023)   Hunger Vital Sign    Worried About Running Out of Food in the Last Year: Never true    Ran Out of Food in the Last Year: Never true  Transportation Needs: No Transportation Needs (05/18/2023)   PRAPARE - Administrator, Civil Service (Medical): No    Lack of Transportation (Non-Medical): No  Physical Activity: Inactive (05/18/2023)   Exercise Vital Sign    Days of Exercise per Week: 0 days    Minutes of Exercise per Session: 0 min  Stress: No Stress Concern Present (05/18/2023)   [employer] of Occupational Health - Occupational Stress Questionnaire    Feeling of Stress : Not at all  Social Connections: Socially  Isolated (05/18/2023)   Social Connection and Isolation Panel [NHANES]    Frequency of Communication with Friends and Family: More than three times a week    Frequency of Social Gatherings with Friends and Family: More than three times a week    Attends Religious Services: Never    Database administrator or Organizations: No    Attends Banker Meetings: Never    Marital Status:  Widowed    Tobacco Counseling Counseling given: Not Answered   Clinical Intake:  Pre-visit preparation completed: Yes  Pain : No/denies pain     BMI - recorded: 27.96 Nutritional Status: BMI 25 -29 Overweight Nutritional Risks: None Diabetes: No  How often do you need to have someone help you when you read instructions, pamphlets, or other written materials from your doctor or pharmacy?: 1 - Never  Interpreter Needed?: No  Information entered by :: Lanier Ensign, LPN   Activities of Daily Living    05/18/2023    1:27 PM 07/03/2022   10:17 AM  In your present state of health, do you have any difficulty performing the following activities:  Hearing? 0 0  Vision? 0 0  Difficulty concentrating or making decisions? 0 0  Walking or climbing stairs? 0 0  Dressing or bathing? 0 0  Doing errands, shopping? 0 0  Preparing Food and eating ? N N  Using the Toilet? N N  In the past six months, have you accidently leaked urine? N N  Do you have problems with loss of bowel control? N N  Managing your Medications? N N  Managing your Finances? N N  Housekeeping or managing your Housekeeping? N N    Patient Care Team: Shelva Majestic, MD as PCP - General (Family Medicine) Artis Delay, MD as Consulting Physician (Hematology and Oncology) Erroll Luna, [facility] (Inactive) (Pharmacist)  Indicate any recent Medical Services you may have received from other than [facility] providers in the past year (date may be approximate).     Assessment:   This is a routine wellness examination for  Wylee.  Hearing/Vision screen Hearing Screening - Comments:: Pt denies any hearing issues  Vision Screening - Comments:: Pt follows up with Dr Dione Booze for annual eye exams   Dietary issues and exercise activities discussed:     Goals Addressed             This Visit's Progress    Patient Stated       Stay living well       Depression Screen    05/18/2023    1:29 PM 01/07/2023    8:08 AM 07/03/2022   10:07 AM 06/25/2022    8:24 AM 06/20/2021    9:38 AM 06/13/2021    9:15 AM 11/29/2020    9:38 AM  PHQ 2/9 Scores  PHQ - 2 Score 0 0 0 0 0 1 0  PHQ- 9 Score    0       Fall Risk    05/18/2023    1:32 PM 01/07/2023    8:08 AM 07/03/2022   10:11 AM 06/25/2022    8:23 AM 06/20/2021    9:41 AM  Fall Risk   Falls in the past year? 0 0 0 0 0  Number falls in past yr: 0 0 0 0 0  Injury with Fall? 0 0 0 0 0  Risk for fall due to : Impaired vision No Fall Risks History of fall(s);Impaired balance/gait;Impaired mobility;Impaired vision History of fall(s) Impaired vision  Follow up Falls prevention discussed Falls evaluation completed Falls evaluation completed Falls evaluation completed Falls prevention discussed    MEDICARE RISK AT HOME: Medicare Risk at Home Any stairs in or around the home?: No If so, are there any without handrails?: No Home free of loose throw rugs in walkways, pet beds, electrical cords, etc?: Yes Adequate lighting in your home to reduce risk of falls?: Yes Life alert?: Yes  Use of a cane, walker or w/c?: Yes Grab bars in the bathroom?: Yes Shower chair or bench in shower?: Yes Elevated toilet seat or a handicapped toilet?: No  TIMED UP AND GO:  Was the test performed?  No    Cognitive Function:    04/07/2018   10:34 AM  MMSE - Mini Mental State Exam  Not completed: --        05/18/2023    1:32 PM 07/03/2022   10:09 AM 06/20/2021    9:42 AM 06/17/2020    9:44 AM 11/23/2019   12:54 PM  6CIT Screen  What Year? 0 points 0 points 0 points 0 points  0 points  What month? 0 points 0 points 0 points 0 points   What time? 0 points 0 points 0 points    Count back from 20 0 points 0 points 0 points 0 points   Months in reverse 0 points 0 points 0 points 0 points   Repeat phrase 0 points 0 points 0 points 6 points   Total Score 0 points 0 points 0 points      Immunizations Immunization History  Administered Date(s) Administered   Fluad Quad(high Dose 65+) 07/17/2021   Influenza Whole 06/30/2013   Influenza, High Dose Seasonal PF 06/25/2016, 06/21/2019, 06/18/2022   Influenza,inj,Quad PF,6+ Mos 06/13/2014   Influenza-Unspecified 07/12/2015, 06/09/2017, 06/21/2020   PFIZER Comirnaty(Gray Top)Covid-19 Tri-Sucrose Vaccine 01/30/2021, 07/09/2022   PFIZER(Purple Top)SARS-COV-2 Vaccination 12/13/2019, 12/30/2019, 07/12/2020   Pfizer Covid-19 Vaccine Bivalent Booster 56yrs & up 07/17/2021   Pneumococcal Conjugate-13 06/25/2016   Pneumococcal Polysaccharide-23 06/21/2013   Respiratory Syncytial Virus Vaccine,Recomb Aduvanted(Arexvy) 07/09/2022   Td 04/07/2018    TDAP status: Due, Education has been provided regarding the importance of this vaccine. Advised may receive this vaccine at local pharmacy or Health Dept. Aware to provide a copy of the vaccination record if obtained from local pharmacy or Health Dept. Verbalized acceptance and understanding.  Flu Vaccine status: Due, Education has been provided regarding the importance of this vaccine. Advised may receive this vaccine at local pharmacy or Health Dept. Aware to provide a copy of the vaccination record if obtained from local pharmacy or Health Dept. Verbalized acceptance and understanding.  Pneumococcal vaccine status: Up to date  Covid-19 vaccine status: Information provided on how to obtain vaccines.   Qualifies for Shingles Vaccine? Yes   Zostavax completed No   Shingrix Completed?: No.    Education has been provided regarding the importance of this vaccine. Patient has been  advised to call insurance company to determine out of pocket expense if they have not yet received this vaccine. Advised may also receive vaccine at local pharmacy or Health Dept. Verbalized acceptance and understanding.  Screening Tests Health Maintenance  Topic Date Due   Zoster Vaccines- Shingrix (1 of 2) Never done   COVID-19 Vaccine (7 - 2023-24 season) 09/03/2022   INFLUENZA VACCINE  04/22/2023   DEXA SCAN  10/23/2043 (Originally 06/21/1998)   Medicare Annual Wellness (AWV)  05/17/2024   DTaP/Tdap/Td (2 - Tdap) 04/07/2028   Pneumonia Vaccine 54+ Years old  Completed   HPV VACCINES  Aged Out    Health Maintenance  Health Maintenance Due  Topic Date Due   Zoster Vaccines- Shingrix (1 of 2) Never done   COVID-19 Vaccine (7 - 2023-24 season) 09/03/2022   INFLUENZA VACCINE  04/22/2023    Colorectal cancer screening: No longer required.   Mammogram status: No longer required due to age.     Additional  Screening:   Vision Screening: Recommended annual ophthalmology exams for early detection of glaucoma and other disorders of the eye. Is the patient up to date with their annual eye exam?  Yes  Who is the provider or what is the name of the office in which the patient attends annual eye exams? Dr Dione Booze  If pt is not established with a provider, would they like to be referred to a provider to establish care? No .   Dental Screening: Recommended annual dental exams for proper oral hygiene    Community Resource Referral / Chronic Care Management: CRR required this visit?  No   CCM required this visit?  No     Plan:     I have personally reviewed and noted the following in the patient's chart:   Medical and social history Use of alcohol, tobacco or illicit drugs  Current medications and supplements including opioid prescriptions. Patient is not currently taking opioid prescriptions. Functional ability and status Nutritional status Physical activity Advanced  directives List of other physicians Hospitalizations, surgeries, and ER visits in previous 12 months Vitals Screenings to include cognitive, depression, and falls Referrals and appointments  In addition, I have reviewed and discussed with patient certain preventive protocols, quality metrics, and best practice recommendations. A written personalized care plan for preventive services as well as general preventive health recommendations were provided to patient.     Marzella Schlein, LPN   8/41/3244   After Visit Summary: (Pick Up) Due to this being a telephonic visit, with patients personalized plan was offered to patient and patient has requested to Pick up at office.  Nurse Notes: none

## 2023-05-20 ENCOUNTER — Other Ambulatory Visit: Payer: Self-pay | Admitting: Family Medicine

## 2023-05-21 NOTE — Telephone Encounter (Signed)
Rx refill request approved per Dr. Corey's orders. 

## 2023-05-25 ENCOUNTER — Emergency Department (HOSPITAL_BASED_OUTPATIENT_CLINIC_OR_DEPARTMENT_OTHER): Payer: PPO

## 2023-05-25 ENCOUNTER — Emergency Department (HOSPITAL_BASED_OUTPATIENT_CLINIC_OR_DEPARTMENT_OTHER)
Admission: EM | Admit: 2023-05-25 | Discharge: 2023-05-25 | Disposition: A | Discharge status: Home / Self Care | Payer: PPO | Attending: Emergency Medicine | Admitting: Emergency Medicine

## 2023-05-25 ENCOUNTER — Emergency Department (HOSPITAL_BASED_OUTPATIENT_CLINIC_OR_DEPARTMENT_OTHER): Payer: PPO | Admitting: Radiology

## 2023-05-25 ENCOUNTER — Other Ambulatory Visit: Payer: Self-pay

## 2023-05-25 ENCOUNTER — Encounter (HOSPITAL_BASED_OUTPATIENT_CLINIC_OR_DEPARTMENT_OTHER): Payer: Self-pay

## 2023-05-25 DIAGNOSIS — D72829 Elevated white blood cell count, unspecified: Secondary | ICD-10-CM | POA: Diagnosis not present

## 2023-05-25 DIAGNOSIS — Z79899 Other long term (current) drug therapy: Secondary | ICD-10-CM | POA: Insufficient documentation

## 2023-05-25 DIAGNOSIS — M25571 Pain in right ankle and joints of right foot: Secondary | ICD-10-CM | POA: Diagnosis not present

## 2023-05-25 DIAGNOSIS — L03115 Cellulitis of right lower limb: Secondary | ICD-10-CM | POA: Insufficient documentation

## 2023-05-25 DIAGNOSIS — Z7901 Long term (current) use of anticoagulants: Secondary | ICD-10-CM | POA: Insufficient documentation

## 2023-05-25 DIAGNOSIS — M7989 Other specified soft tissue disorders: Secondary | ICD-10-CM | POA: Diagnosis not present

## 2023-05-25 DIAGNOSIS — M7731 Calcaneal spur, right foot: Secondary | ICD-10-CM | POA: Diagnosis not present

## 2023-05-25 DIAGNOSIS — M19071 Primary osteoarthritis, right ankle and foot: Secondary | ICD-10-CM | POA: Diagnosis not present

## 2023-05-25 DIAGNOSIS — R233 Spontaneous ecchymoses: Secondary | ICD-10-CM

## 2023-05-25 LAB — COMPREHENSIVE METABOLIC PANEL
ALT: 13 U/L (ref 0–44)
AST: 17 U/L (ref 15–41)
Albumin: 4 g/dL (ref 3.5–5.0)
Alkaline Phosphatase: 47 U/L (ref 38–126)
Anion gap: 8 (ref 5–15)
BUN: 21 mg/dL (ref 8–23)
CO2: 30 mmol/L (ref 22–32)
Calcium: 8.8 mg/dL — ABNORMAL LOW (ref 8.9–10.3)
Chloride: 101 mmol/L (ref 98–111)
Creatinine, Ser: 0.79 mg/dL (ref 0.44–1.00)
GFR, Estimated: >60 mL/min (ref >60)
Glucose, Bld: 126 mg/dL — ABNORMAL HIGH (ref 70–99)
Potassium: 4 mmol/L (ref 3.5–5.1)
Sodium: 139 mmol/L (ref 135–145)
Total Bilirubin: 1.5 mg/dL — ABNORMAL HIGH (ref 0.3–1.2)
Total Protein: 5.7 g/dL — ABNORMAL LOW (ref 6.5–8.1)

## 2023-05-25 LAB — CBC WITH DIFFERENTIAL/PLATELET
Abs Immature Granulocytes: 0.08 10*3/uL — ABNORMAL HIGH (ref 0.00–0.07)
Basophils Absolute: 0 10*3/uL (ref 0.0–0.1)
Basophils Relative: 0 %
Eosinophils Absolute: 0 10*3/uL (ref 0.0–0.5)
Eosinophils Relative: 0 %
HCT: 40.7 % (ref 36.0–46.0)
Hemoglobin: 13.3 g/dL (ref 12.0–15.0)
Immature Granulocytes: 0 %
Lymphocytes Relative: 46 %
Lymphs Abs: 8.2 10*3/uL — ABNORMAL HIGH (ref 0.7–4.0)
MCH: 28.7 pg (ref 26.0–34.0)
MCHC: 32.7 g/dL (ref 30.0–36.0)
MCV: 87.9 fL (ref 80.0–100.0)
Monocytes Absolute: 0.9 10*3/uL (ref 0.1–1.0)
Monocytes Relative: 5 %
Neutro Abs: 8.9 10*3/uL — ABNORMAL HIGH (ref 1.7–7.7)
Neutrophils Relative %: 49 %
Platelets: 85 10*3/uL — ABNORMAL LOW (ref 150–400)
RBC: 4.63 MIL/uL (ref 3.87–5.11)
RDW: 14.8 % (ref 11.5–15.5)
WBC: 18.1 10*3/uL — ABNORMAL HIGH (ref 4.0–10.5)
nRBC: 0 % (ref 0.0–0.2)

## 2023-05-25 MED ORDER — CEPHALEXIN 500 MG PO CAPS: 500.0000 mg | ORAL_CAPSULE | Freq: Four times a day (QID) | ORAL | 0 refills | Status: DC

## 2023-05-25 NOTE — ED Notes (Signed)
Patient verbalizes understanding of discharge instructions. Opportunity for questioning and answers were provided. Patient discharged from ED. Discharge instruction discussed with patient and her daughter.

## 2023-05-25 NOTE — ED Provider Notes (Signed)
[place] EMERGENCY DEPARTMENT AT [facility] Provider Note   CSN: [id number] Arrival date & time: 05/25/23  0940     History  Chief Complaint  Patient presents with   Leg Injury    Right ankle    Maria Lowe is a 89 y.o. female.  HPI   89 year old female with medical history significant for CLL who presents to the emergency department with right ankle swelling and pain.  The patient denies any trauma to her leg.  She noticed slight discoloration to the ankle 1 week ago.  It looks like bruising.  Today she noticed swelling in her right ankle and had difficulty putting weight.  She is with worsening pain.  No significant redness or warmth.  No known trauma to the ankle or lower extremity.  Home Medications Prior to Admission medications   Medication Sig Start Date End Date Taking? Authorizing Provider  cephALEXin (KEFLEX) 500 MG capsule Take 1 capsule (500 mg total) by mouth 4 (four) times daily. 05/25/23  Yes Ernie Avena, MD  amLODipine (NORVASC) 10 MG tablet TAKE 1 TABLET BY MOUTH EVERY DAY 05/25/23   Shelva Majestic, MD  atenolol (TENORMIN) 100 MG tablet TAKE 1 TABLET BY MOUTH EVERY DAY 05/25/23   Shelva Majestic, MD  ELIQUIS 5 MG TABS tablet TAKE 1 TABLET BY MOUTH TWICE A DAY 05/25/23   Shelva Majestic, MD  gabapentin (NEURONTIN) 100 MG capsule TAKE 1 CAPSULE (100 MG TOTAL) BY MOUTH AT BEDTIME AS NEEDED (NERVE PAIN). 05/21/23   Rodolph Bong, MD  hydrochlorothiazide (HYDRODIURIL) 25 MG tablet TAKE 1 TABLET BY MOUTH EVERY DAY 03/10/22   Shelva Majestic, MD  losartan (COZAAR) 100 MG tablet TAKE 1 TABLET BY MOUTH EVERY DAY 05/04/23   Shelva Majestic, MD  meclizine (ANTIVERT) 25 MG tablet Take 1 tablet (25 mg total) by mouth 3 (three) times daily as needed for dizziness. 10/22/21   Curatolo, Adam, DO  Multiple Vitamins-Minerals (ONE-A-DAY WOMENS 50+ PO) Take by mouth daily.    [provider]  rosuvastatin (CRESTOR) 5 MG tablet Take 1 tablet (5 mg total) by  mouth once a week. 06/25/22   Shelva Majestic, MD      Allergies    Patient has no known allergies.    Review of Systems   Review of Systems  All other systems reviewed and are negative.   Physical Exam Updated Vital Signs BP (!) 142/58   Pulse 72   Temp 97.7 F (36.5 C) (Oral)   Resp 17   Ht 5\' 1"  (1.549 m)   Wt 63.5 kg   SpO2 100%   BMI 26.45 kg/m  Physical Exam Vitals and nursing note reviewed.  Constitutional:      General: She is not in acute distress.    Appearance: She is well-developed.  HENT:     Head: Normocephalic and atraumatic.  Eyes:     Conjunctiva/sclera: Conjunctivae normal.  Cardiovascular:     Rate and Rhythm: Normal rate and regular rhythm.  Pulmonary:     Effort: Pulmonary effort is normal. No respiratory distress.     Breath sounds: Normal breath sounds.  Abdominal:     Palpations: Abdomen is soft.     Tenderness: There is no abdominal tenderness.  Musculoskeletal:        General: Swelling present.     Cervical back: Neck supple.     Comments: RLE 2+ DP pulses, petechiae and ecchymoses noted to the lower extremity, no  significant erythema or warmth, 1+ edema, mildly tender to palpation.  Skin:    General: Skin is warm and dry.     Capillary Refill: Capillary refill takes less than 2 seconds.  Neurological:     Mental Status: She is alert.  Psychiatric:        Mood and Affect: Mood normal.     ED Results / Procedures / Treatments   Labs (all labs ordered are listed, but only abnormal results are displayed) Labs Reviewed  CBC WITH DIFFERENTIAL/PLATELET - Abnormal; Notable for the following components:      Result Value   WBC 18.1 (*)    Platelets 85 (*)    Neutro Abs 8.9 (*)    Lymphs Abs 8.2 (*)    Abs Immature Granulocytes 0.08 (*)    All other components within normal limits  COMPREHENSIVE METABOLIC PANEL - Abnormal; Notable for the following components:   Glucose, Bld 126 (*)    Calcium 8.8 (*)    Total Protein 5.7 (*)     Total Bilirubin 1.5 (*)    All other components within normal limits    EKG None  Radiology No results found.  Procedures Procedures    Medications Ordered in ED Medications - No data to display  ED Course/ Medical Decision Making/ A&P                                 Medical Decision Making Amount and/or Complexity of Data Reviewed Labs: ordered. Radiology: ordered.  Risk Prescription drug management.    89 year old female with medical history significant for CLL who presents to the emergency department with right ankle swelling and pain.  The patient denies any trauma to her leg.  She noticed slight discoloration to the ankle 1 week ago.  It looks like bruising.  Today she noticed swelling in her right ankle and had difficulty putting weight.  She is with worsening pain.  No significant redness or warmth.  No known trauma to the ankle or lower extremity.  On arrival, the patient was vitally stable, afebrile, not tachycardic or tachypneic, BP 157/135, saturating 96% on room air.  Physical exam reveals 1+, ecchymosis and petechiae present.  Considered early cellulitis, considered ITP, considered DVT.  Less likely fracture.  DG Right Ankle: IMPRESSION:  Mild degenerative changes. Plantar calcaneal spur. Soft tissue  swelling.   DVT US: IMPRESSION:  No evidence of right lower extremity DVT.    Labs: Leukocytosis to 18.1 however the patient has CLL, CMP without significant abnormality, thrombocytopenia which appears to be acute on chronic, baseline platelets around 100-130.  Given the patient's worsening swelling and tenderness, considered early cellulitis however no erythema or significant warmth.  Discussed observation versus treatment empirically with antibiotics and close follow-up with her PCP and/or oncologist.  Discussed with Dr. Bertis Ruddy who agreed with plan for likely follow-up in clinic later this week. Will place the patient on Keflex, return precautions provided.  Stable for discharge with close outpatient follow-up.    Final Clinical Impression(s) / ED Diagnoses Final diagnoses:  Cellulitis of right lower extremity  Petechiae or ecchymoses    Rx / DC Orders ED Discharge Orders          Ordered    cephALEXin (KEFLEX) 500 MG capsule  4 times daily        05/25/23 1537              Shellyann Wandrey,  Fayrene Fearing, MD 05/29/23 1143

## 2023-05-25 NOTE — Telephone Encounter (Signed)
Prescription Request  05/25/2023  LOV: 01/07/2023  What is the name of the medication or equipment?  ELIQUIS 5 MG TABS tablet   Have you contacted your pharmacy to request a refill? Yes   Which pharmacy would you like this sent to? [id number] - [place], [place] - [address] [place] [address] Phone: [phone] Fax: [phone]   Patient notified that their request is being sent to the clinical staff for review and that they should receive a response within 2 business days.   Please advise at Mobile [phone] (mobile)

## 2023-05-25 NOTE — ED Triage Notes (Addendum)
Pt to ED c/o right ankle pain. Noticed discoloration a week ago to right ankle, progressively getting worse. Today ankle swelling and difficulty to put weight on. ?cellulitis.

## 2023-05-25 NOTE — Discharge Instructions (Addendum)
Your symptoms of pain and swelling could be early cellulitis which we will place you on an antibiotic for therapy.  Recommend you follow-up with your hematology oncologist, Dr. Bertis Ruddy in clinic.  Your x-ray was unremarkable and your ultrasound was negative for a blood clot.

## 2023-05-26 ENCOUNTER — Telehealth: Payer: Self-pay

## 2023-05-26 NOTE — Telephone Encounter (Signed)
Called and scheduled appt for lab and MD on 9/10. She is aware of appt time/date.

## 2023-05-28 ENCOUNTER — Telehealth: Payer: Self-pay | Admitting: Hematology and Oncology

## 2023-06-01 ENCOUNTER — Other Ambulatory Visit: Payer: PPO

## 2023-06-01 ENCOUNTER — Ambulatory Visit: Payer: PPO | Admitting: Hematology and Oncology

## 2023-06-10 ENCOUNTER — Telehealth: Payer: Self-pay | Admitting: *Deleted

## 2023-06-10 NOTE — Telephone Encounter (Signed)
Transition Care Management Unsuccessful Follow-up Telephone Call  Date of discharge and from where:  [facility]  05/25/2023  Attempts:  1st Attempt  Reason for unsuccessful TCM follow-up call:  Left voice message

## 2023-06-29 ENCOUNTER — Other Ambulatory Visit: Payer: Self-pay

## 2023-06-29 ENCOUNTER — Ambulatory Visit: Payer: PPO | Admitting: Family Medicine

## 2023-06-29 VITALS — BP 132/76 | HR 84 | Ht 61.0 in | Wt 145.0 lb

## 2023-06-29 DIAGNOSIS — M25552 Pain in left hip: Secondary | ICD-10-CM

## 2023-06-29 DIAGNOSIS — C9110 Chronic lymphocytic leukemia of B-cell type not having achieved remission: Secondary | ICD-10-CM | POA: Diagnosis not present

## 2023-06-29 DIAGNOSIS — D696 Thrombocytopenia, unspecified: Secondary | ICD-10-CM | POA: Diagnosis not present

## 2023-06-29 DIAGNOSIS — N183 Chronic kidney disease, stage 3 unspecified: Secondary | ICD-10-CM | POA: Diagnosis not present

## 2023-06-29 DIAGNOSIS — G8929 Other chronic pain: Secondary | ICD-10-CM

## 2023-06-29 LAB — CBC WITH DIFFERENTIAL/PLATELET
Basophils Absolute: 0 10*3/uL (ref 0.0–0.1)
Basophils Relative: 0.3 % (ref 0.0–3.0)
Eosinophils Absolute: 0.1 10*3/uL (ref 0.0–0.7)
Eosinophils Relative: 0.4 % (ref 0.0–5.0)
HCT: 41.8 % (ref 36.0–46.0)
Hemoglobin: 13.6 g/dL (ref 12.0–15.0)
Lymphocytes Relative: 68.1 % — ABNORMAL HIGH (ref 12.0–46.0)
Lymphs Abs: 11.1 10*3/uL — ABNORMAL HIGH (ref 0.7–4.0)
MCHC: 32.6 g/dL (ref 30.0–36.0)
MCV: 87.7 fL (ref 78.0–100.0)
Monocytes Absolute: 0.5 10*3/uL (ref 0.1–1.0)
Monocytes Relative: 3.4 % (ref 3.0–12.0)
Neutro Abs: 4.5 10*3/uL (ref 1.4–7.7)
Neutrophils Relative %: 27.8 % — ABNORMAL LOW (ref 43.0–77.0)
Platelets: 91 10*3/uL — ABNORMAL LOW (ref 150.0–400.0)
RBC: 4.77 Mil/uL (ref 3.87–5.11)
RDW: 16.2 % — ABNORMAL HIGH (ref 11.5–15.5)
WBC: 16.3 10*3/uL — ABNORMAL HIGH (ref 4.0–10.5)

## 2023-06-29 LAB — COMPREHENSIVE METABOLIC PANEL
ALT: 15 U/L (ref 0–35)
AST: 17 U/L (ref 0–37)
Albumin: 4.4 g/dL (ref 3.5–5.2)
Alkaline Phosphatase: 57 U/L (ref 39–117)
BUN: 30 mg/dL — ABNORMAL HIGH (ref 6–23)
CO2: 26 meq/L (ref 19–32)
Calcium: 9.6 mg/dL (ref 8.4–10.5)
Chloride: 104 meq/L (ref 96–112)
Creatinine, Ser: 0.92 mg/dL (ref 0.40–1.20)
GFR: 55.01 mL/min — ABNORMAL LOW (ref >60.00)
Glucose, Bld: 119 mg/dL — ABNORMAL HIGH (ref 70–99)
Potassium: 4.1 meq/L (ref 3.5–5.1)
Sodium: 141 meq/L (ref 135–145)
Total Bilirubin: 0.8 mg/dL (ref 0.2–1.2)
Total Protein: 6.2 g/dL (ref 6.0–8.3)

## 2023-06-29 LAB — PROTIME-INR
INR: 1.5 {ratio} — ABNORMAL HIGH (ref 0.8–1.0)
Prothrombin Time: 16.1 s — ABNORMAL HIGH (ref 9.6–13.1)

## 2023-06-29 LAB — APTT: aPTT: 37.7 s — ABNORMAL HIGH (ref 25.4–36.8)

## 2023-06-29 NOTE — Patient Instructions (Addendum)
Thank you for coming in today.   You received an injection today. Seek immediate medical attention if the joint becomes red, extremely painful, or is oozing fluid.  Please get labs today before you leave   I can do this injection again in about 3 months which would be early January

## 2023-06-29 NOTE — Progress Notes (Signed)
Rubin Payor, PhD, LAT, ATC acting as a scribe for Clementeen Graham, MD.  Maria Lowe is a 89 y.o. female who presents to [facility] today for cont'd L hip pain. Pt was last seen by Dr. Denyse Amass on 04/06/23 and was given a repeat L intra-articular hip steroid injection.   Today, pt reports she had more relief from the back in April vs the July injection. She finds the injections to help w/ her balance. She also c/o swelling in her R LE and was seen for this at the ED on Sept 3rd. DVT was negative.  During the emergency room visit on September 3 she had significant purpura right lower extremity.  Etiology was unclear thought to perhaps be cellulitis.  She did respond to antibiotics a bit and her symptoms are improving.  She still notes some continued left leg swelling and erythema but no purpura.  She has an appointment scheduled with her hematologist/oncologist Dr. Bertis Ruddy in 2 days on October 10.  Dx imaging: 09/19/2022 L-spine MRI             08/25/2022 L hip XR 08/18/22 L-spine XR  Pertinent review of systems: No fevers or chills  Relevant historical information: CLL   Exam:  BP 132/76   Pulse 84   Ht 5\' 1"  (1.549 m)   Wt 145 lb (65.8 kg)   SpO2 96%   BMI 27.40 kg/m  General: Well Developed, well nourished, and in no acute distress.   MSK: Hip normal-appearing decreased range of motion some pain with flexion.  Right lower extremity mild erythema mild nonpitting edema no purpura.    Lab and Radiology Results  Procedure: Real-time Ultrasound Guided Injection of left hip femoral acetabular joint anterior approach Device: Philips Affiniti 50G/GE Logiq Images permanently stored and available for review in PACS Verbal informed consent obtained.  Discussed risks and benefits of procedure. Warned about infection, bleeding, hyperglycemia damage to structures among others. Patient expresses understanding and agreement Time-out conducted.   Noted no  overlying erythema, induration, or other signs of local infection.   Skin prepped in a sterile fashion.   Local anesthesia: Topical Ethyl chloride.   With sterile technique and under real time ultrasound guidance: 40 mg of Kenalog and 2 mL of Marcaine injected into hip joint. Fluid seen entering the joint capsule.   Completed without difficulty   Pain immediately resolved suggesting accurate placement of the medication.   Advised to call if fevers/chills, erythema, induration, drainage, or persistent bleeding.   Images permanently stored and available for review in the ultrasound unit.  Impression: Technically successful ultrasound guided injection.         Assessment and Plan: 89 y.o. female with left hip pain due to DJD.  Plan for repeat steroid injection today.  Is been about 3 months since the last injection.  I can do this again in about 3 months which would be early January.  She has an appointment scheduled with her oncologist in 2 days.  The skin changes seen in the emergency room look to me consistent with purpura.  Her platelets were normal range during that visit but I expect her oncologist will want to get some labs including a CBC.  The steroid injection today will make lab analysis challenging.  Will go ahead and check labs that I think her oncologist will want to get such as CBC with differential INR APTT and metabolic panel.   PDMP not reviewed this encounter.  Orders Placed This Encounter  Procedures   [place] LIMITED JOINT SPACE STRUCTURES LOW LEFT(NO LINKED CHARGES)    Order Specific Question:   Reason for Exam (SYMPTOM  OR DIAGNOSIS REQUIRED)    Answer:   left hip pain    Order Specific Question:   Preferred imaging location?    Answer:   [facility]   CBC w/Diff    Standing Status:   Future    Number of Occurrences:   1    Standing Expiration Date:   06/28/2024   Protime-INR    Standing Status:   Future    Number of Occurrences:   1     Standing Expiration Date:   06/28/2024   APTT    Standing Status:   Future    Number of Occurrences:   1    Standing Expiration Date:   06/28/2024   Comprehensive metabolic panel    Standing Status:   Future    Number of Occurrences:   1    Standing Expiration Date:   06/28/2024   No orders of the defined types were placed in this encounter.    Discussed warning signs or symptoms. Please see discharge instructions. Patient expresses understanding.   The above documentation has been reviewed and is accurate and complete Clementeen Graham, M.D.

## 2023-06-30 NOTE — Progress Notes (Signed)
Platelets are a bit low and some of the clotting factors are a bit low as well.  That probably explains the bruising that you experience the other day.  This should help Dr Bertis Ruddy

## 2023-07-01 ENCOUNTER — Inpatient Hospital Stay: Payer: PPO

## 2023-07-01 ENCOUNTER — Encounter: Payer: Self-pay | Admitting: Hematology and Oncology

## 2023-07-01 ENCOUNTER — Inpatient Hospital Stay: Payer: PPO | Attending: Hematology and Oncology | Admitting: Hematology and Oncology

## 2023-07-01 VITALS — BP 146/76 | HR 79 | Temp 97.8°F | Resp 18 | Ht 61.0 in | Wt 145.2 lb

## 2023-07-01 DIAGNOSIS — D696 Thrombocytopenia, unspecified: Secondary | ICD-10-CM | POA: Diagnosis not present

## 2023-07-01 DIAGNOSIS — C9110 Chronic lymphocytic leukemia of B-cell type not having achieved remission: Secondary | ICD-10-CM | POA: Insufficient documentation

## 2023-07-01 NOTE — Assessment & Plan Note (Addendum)
We reviewed her test results Clinically, she is not symptomatic Her lymphocyte count is stable.  Her platelet count is stable There is no indications to treat I will see her again next year in May

## 2023-07-01 NOTE — Assessment & Plan Note (Signed)
This is related to CLL but she is not symptomatic.  Continue observation only. The patient is educated to watch out for signs and symptoms of bleeding There is no contraindication for her to remain on Eliquis as long as her platelet count is over 50,000

## 2023-07-01 NOTE — Progress Notes (Signed)
[facility] OFFICE PROGRESS NOTE  Patient Care Team: Shelva Majestic, MD as PCP - General (Family Medicine) Artis Delay, MD as Consulting Physician (Hematology and Oncology) Erroll Luna, [facility] (Inactive) (Pharmacist)  ASSESSMENT & PLAN:  CLL (chronic lymphocytic leukemia) (HCC) We reviewed her test results Clinically, she is not symptomatic Her lymphocyte count is stable.  Her platelet count is stable There is no indications to treat I will see her again next year in May  Thrombocytopenia [facility]) This is related to CLL but she is not symptomatic.  Continue observation only. The patient is educated to watch out for signs and symptoms of bleeding There is no contraindication for her to remain on Eliquis as long as her platelet count is over 50,000  No orders of the defined types were placed in this encounter.   All questions were answered. The patient knows to call the clinic with any problems, questions or concerns. The total time spent in the appointment was 20 minutes encounter with patients including review of chart and various tests results, discussions about plan of care and coordination of care plan   Artis Delay, MD 07/01/2023 1:49 PM  INTERVAL HISTORY: Please see below for problem oriented charting. she returns for surveillance follow-up There is concerned that she had recent worsening blood count and hence was referred back to see me sooner than her previous scheduled appointment She completed a course of antibiotic therapy recently for cellulitis She had recent blood drawn by her sports medicine doctor, test results were reviewed Overall, she is improved No recent bleeding  REVIEW OF SYSTEMS:   Constitutional: Denies fevers, chills or abnormal weight loss Eyes: Denies blurriness of vision Ears, nose, mouth, throat, and face: Denies mucositis or sore throat Respiratory: Denies cough, dyspnea or wheezes Gastrointestinal:  Denies nausea, heartburn  or change in bowel habits  Lymphatics: Denies new lymphadenopathy or easy bruising Neurological:Denies numbness, tingling or new weaknesses Behavioral/Psych: Mood is stable, no new changes  All other systems were reviewed with the patient and are negative.  I have reviewed the past medical history, past surgical history, social history and family history with the patient and they are unchanged from previous note.  ALLERGIES:  has No Known Allergies.  MEDICATIONS:  Current Outpatient Medications  Medication Sig Dispense Refill   amLODipine (NORVASC) 10 MG tablet TAKE 1 TABLET BY MOUTH EVERY DAY 90 tablet 3   atenolol (TENORMIN) 100 MG tablet TAKE 1 TABLET BY MOUTH EVERY DAY 90 tablet 3   cephALEXin (KEFLEX) 500 MG capsule Take 1 capsule (500 mg total) by mouth 4 (four) times daily. 20 capsule 0   ELIQUIS 5 MG TABS tablet TAKE 1 TABLET BY MOUTH TWICE A DAY 60 tablet 5   gabapentin (NEURONTIN) 100 MG capsule TAKE 1 CAPSULE (100 MG TOTAL) BY MOUTH AT BEDTIME AS NEEDED (NERVE PAIN). 60 capsule 3   hydrochlorothiazide (HYDRODIURIL) 25 MG tablet TAKE 1 TABLET BY MOUTH EVERY DAY 90 tablet 3   losartan (COZAAR) 100 MG tablet TAKE 1 TABLET BY MOUTH EVERY DAY 90 tablet 3   meclizine (ANTIVERT) 25 MG tablet Take 1 tablet (25 mg total) by mouth 3 (three) times daily as needed for dizziness. 30 tablet 0   Multiple Vitamins-Minerals (ONE-A-DAY WOMENS 50+ PO) Take by mouth daily.     rosuvastatin (CRESTOR) 5 MG tablet Take 1 tablet (5 mg total) by mouth once a week. 13 tablet 3   No current facility-administered medications for this visit.  SUMMARY OF ONCOLOGIC HISTORY: Oncology History  CLL (chronic lymphocytic leukemia) (HCC)  11/27/2013 Initial Diagnosis   CLL (chronic lymphocytic leukemia) (HCC)   06/17/2021 Cancer Staging   Staging form: Chronic Lymphocytic Leukemia / Small Lymphocytic Lymphoma, AJCC 8th Edition - Clinical stage from 06/17/2021: Modified Rai Stage 0 (Modified Rai risk: Low,  Lymphocytosis: Present, Adenopathy: Absent, Organomegaly: Absent, Anemia: Absent, Thrombocytopenia: Absent) - Signed by Artis Delay, MD on 06/17/2021 Stage prefix: Initial diagnosis     PHYSICAL EXAMINATION: ECOG PERFORMANCE STATUS: 1 - Symptomatic but completely ambulatory  Vitals:   07/01/23 1133  BP: (!) 146/76  Pulse: 79  Resp: 18  Temp: 97.8 F (36.6 C)  SpO2: 98%   Filed Weights   07/01/23 1133  Weight: 145 lb 3.2 oz (65.9 kg)    GENERAL:alert, no distress and comfortable SKIN: Noted bilateral lower extremity edema with chronic venous stasis changes.  No signs of cellulitis   LABORATORY DATA:  I have reviewed the data as listed    Component Value Date/Time   NA 141 06/29/2023 0919   NA 141 11/28/2015 0959   K 4.1 06/29/2023 0919   K 4.3 11/28/2015 0959   CL 104 06/29/2023 0919   CO2 26 06/29/2023 0919   CO2 27 11/28/2015 0959   GLUCOSE 119 (H) 06/29/2023 0919   GLUCOSE 113 11/28/2015 0959   BUN 30 (H) 06/29/2023 0919   BUN 23.6 11/28/2015 0959   CREATININE 0.92 06/29/2023 0919   CREATININE 0.89 (H) 05/30/2020 1419   CREATININE 1.0 11/28/2015 0959   CALCIUM 9.6 06/29/2023 0919   CALCIUM 9.5 11/28/2015 0959   PROT 6.2 06/29/2023 0919   PROT 6.6 11/28/2015 0959   ALBUMIN 4.4 06/29/2023 0919   ALBUMIN 4.2 11/28/2015 0959   AST 17 06/29/2023 0919   AST 17 11/28/2015 0959   ALT 15 06/29/2023 0919   ALT 15 11/28/2015 0959   ALKPHOS 57 06/29/2023 0919   ALKPHOS 72 11/28/2015 0959   BILITOT 0.8 06/29/2023 0919   BILITOT 0.64 11/28/2015 0959   GFRNONAA >60 05/25/2023 1210   GFRNONAA 59 (L) 05/30/2020 1419   GFRAA 68 05/30/2020 1419    No results found for: "SPEP", "UPEP"  Lab Results  Component Value Date   WBC 16.3 (H) 06/29/2023   NEUTROABS 4.5 06/29/2023   HGB 13.6 06/29/2023   HCT 41.8 06/29/2023   MCV 87.7 06/29/2023   PLT 91.0 (L) 06/29/2023      Chemistry      Component Value Date/Time   NA 141 06/29/2023 0919   NA 141 11/28/2015 0959    K 4.1 06/29/2023 0919   K 4.3 11/28/2015 0959   CL 104 06/29/2023 0919   CO2 26 06/29/2023 0919   CO2 27 11/28/2015 0959   BUN 30 (H) 06/29/2023 0919   BUN 23.6 11/28/2015 0959   CREATININE 0.92 06/29/2023 0919   CREATININE 0.89 (H) 05/30/2020 1419   CREATININE 1.0 11/28/2015 0959      Component Value Date/Time   CALCIUM 9.6 06/29/2023 0919   CALCIUM 9.5 11/28/2015 0959   ALKPHOS 57 06/29/2023 0919   ALKPHOS 72 11/28/2015 0959   AST 17 06/29/2023 0919   AST 17 11/28/2015 0959   ALT 15 06/29/2023 0919   ALT 15 11/28/2015 0959   BILITOT 0.8 06/29/2023 0919   BILITOT 0.64 11/28/2015 0959

## 2023-08-12 ENCOUNTER — Ambulatory Visit (INDEPENDENT_AMBULATORY_CARE_PROVIDER_SITE_OTHER): Payer: PPO | Admitting: Family Medicine

## 2023-08-12 ENCOUNTER — Encounter: Payer: Self-pay | Admitting: Family Medicine

## 2023-08-12 VITALS — BP 112/70 | HR 74 | Temp 97.7°F | Ht 61.0 in | Wt 141.4 lb

## 2023-08-12 DIAGNOSIS — Z131 Encounter for screening for diabetes mellitus: Secondary | ICD-10-CM | POA: Diagnosis not present

## 2023-08-12 DIAGNOSIS — I4821 Permanent atrial fibrillation: Secondary | ICD-10-CM

## 2023-08-12 DIAGNOSIS — Z0000 Encounter for general adult medical examination without abnormal findings: Secondary | ICD-10-CM | POA: Diagnosis not present

## 2023-08-12 DIAGNOSIS — R739 Hyperglycemia, unspecified: Secondary | ICD-10-CM | POA: Diagnosis not present

## 2023-08-12 DIAGNOSIS — I10 Essential (primary) hypertension: Secondary | ICD-10-CM

## 2023-08-12 DIAGNOSIS — E785 Hyperlipidemia, unspecified: Secondary | ICD-10-CM | POA: Diagnosis not present

## 2023-08-12 DIAGNOSIS — C9110 Chronic lymphocytic leukemia of B-cell type not having achieved remission: Secondary | ICD-10-CM

## 2023-08-12 MED ORDER — HYDROCHLOROTHIAZIDE 12.5 MG PO TABS: 12.5000 mg | ORAL_TABLET | Freq: Every day | ORAL | 3 refills | Status: DC

## 2023-08-12 MED ORDER — APIXABAN 5 MG PO TABS: 5.0000 mg | ORAL_TABLET | Freq: Two times a day (BID) | ORAL | 11 refills | Status: DC

## 2023-08-12 NOTE — Progress Notes (Signed)
Phone [phone]   Subjective:  Patient presents today for their annual physical. Chief complaint-noted.   See problem oriented charting- ROS- full  review of systems was completed and negative Per full ROS sheet completed by patient   The following were reviewed and entered/updated in epic: Past Medical History:  Diagnosis Date   Anticoagulant long-term use    eliquis-- managed by cardiology   Arthritis    knees   CKD (chronic kidney disease), stage III (HCC)    CLL (chronic lymphocytic leukemia) (HCC) 11/27/2013   oncologist-- dr Bertis Ruddy;  initial dx 03/ 2015, no treatment, active survillance   Essential hypertension    followed by pcp   Full dentures    History of gastric ulcer    stomach ulcer when young   Lymphocytosis 2009   followed oncology   Permanent atrial fibrillation (HCC) 11/2020   cardiologist-- primary dr w. Flora Lipps and followed by atrial clinic;    dx 003/ 2022, atenolol for rate control and on eliquis   Rectocele    Thrombocythemia 2009   related to CLL, followed by dr Bertis Ruddy   Urinary incontinence    Vaginal atrophy    Patient Active Problem List   Diagnosis Date Noted   Persistent atrial fibrillation (HCC) 11/29/2020    Priority: High   Thrombocytopenia (HCC) 11/26/2014    Priority: High   CLL (chronic lymphocytic leukemia) (HCC) 11/27/2013    Priority: High   Hyperlipidemia, unspecified 04/07/2018    Priority: Medium    CKD (chronic kidney disease), stage III (HCC) 02/09/2018    Priority: Medium    Hyperglycemia 05/08/2013    Priority: Medium    Essential hypertension, benign 11/07/2012    Priority: Medium    H/O rectocele repair 08/25/2021    Priority: Low   Secondary hypercoagulable state (HCC) 12/10/2020    Priority: Low   Rash 02/09/2018    Priority: Low   Seborrheic dermatitis 01/08/2017    Priority: Low   Former smoker 09/30/2015    Priority: Low   Rectocele 03/29/2015    Priority: Low   Obesity, unspecified 11/07/2012     Priority: Low   Acute left-sided low back pain with left-sided sciatica 06/25/2022   Past Surgical History:  Procedure Laterality Date   APPENDECTOMY     1980s   CATARACT EXTRACTION W/ INTRAOCULAR LENS IMPLANT Bilateral 2020   RECTOCELE REPAIR N/A 08/25/2021   Procedure: POSTERIOR REPAIR (RECTOCELE);  Surgeon: Romualdo Bolk, MD;  Location: [facility];  Service: Gynecology;  Laterality: N/A;   TONSILLECTOMY AND ADENOIDECTOMY     age 13    Family History  Problem Relation Age of Onset   Healthy Mother        died age 89 (communist country could not get records)   Other Father        died young-unclear cause   Other Sister        died in 32 "old age. hard to get answers in [place]   Other Sister        23,  hard to get answers in [place]- possible cancer- rectal or colorectal?   Other Sister        died at 8- unclear cause    Medications- reviewed and updated Current Outpatient Medications  Medication Sig Dispense Refill   amLODipine (NORVASC) 10 MG tablet TAKE 1 TABLET BY MOUTH EVERY DAY 90 tablet 3   atenolol (TENORMIN) 100 MG tablet TAKE 1 TABLET BY MOUTH EVERY DAY 90  tablet 3   ELIQUIS 5 MG TABS tablet TAKE 1 TABLET BY MOUTH TWICE A DAY 60 tablet 5   gabapentin (NEURONTIN) 100 MG capsule TAKE 1 CAPSULE (100 MG TOTAL) BY MOUTH AT BEDTIME AS NEEDED (NERVE PAIN). 60 capsule 3   hydrochlorothiazide (HYDRODIURIL) 12.5 MG tablet Take 1 tablet (12.5 mg total) by mouth daily. 90 tablet 3   losartan (COZAAR) 100 MG tablet TAKE 1 TABLET BY MOUTH EVERY DAY 90 tablet 3   Multiple Vitamins-Minerals (ONE-A-DAY WOMENS 50+ PO) Take by mouth daily.     rosuvastatin (CRESTOR) 5 MG tablet Take 1 tablet (5 mg total) by mouth once a week. 13 tablet 3   No current facility-administered medications for this visit.    Allergies-reviewed and updated No Known Allergies  Social History   Social History Narrative   Lives alone- cooks Sunday dinner, cares for home and in  good shape. Widowed April 2016. 3 children. 7 grandkids. 3 greatgrandkids.   Husband worked as Clinical research associate.       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 Oropharynx normal Neck: no thyromegaly CV: RRR no murmurs rubs or gallops Lungs: CTAB no crackles, wheeze, rhonchi Abdomen: soft/nontender/nondistended/normal bowel sounds. No rebound or guarding.  Ext: 1+ edema right > left Skin: warm, dry Neuro: grossly normal, moves all extremities, PERRLA   Assessment and Plan   89 y.o. female presenting for annual physical.  Health Maintenance counseling: 1. Anticipatory guidance: Patient counseled regarding regular dental exams -dentures, eye exams - no issues and prefers not to see optho at the moment,  avoiding smoking and second hand smoke , limiting alcohol to 1 beverage per day-doesn't drink , no illicit drugs .   2. Risk factor reduction:  Advised patient of need for regular exercise and diet rich and fruits and vegetables to reduce risk of heart attack and stroke.  Exercise- vacuums home still, still cooking, cleaning, walking some in her home- fully independent.  Diet/weight management-down 7 lbs in last year- portion sizes/appetite dropping.  Wt Readings from Last 3 Encounters:  08/12/23 141 lb 6.4 oz (64.1 kg)  07/01/23 145 lb 3.2 oz (65.9 kg)  06/29/23 145 lb (65.8 kg)  3. Immunizations/screenings/ancillary studies- consider shingrix though does not recall history of chicken pox (at pharmacy) - she declines Immunization History  Administered Date(s) Administered   Fluad Quad(high Dose 65+) 07/17/2021   Influenza Whole 06/30/2013   Influenza, High Dose Seasonal PF  06/25/2016, 06/21/2019, 06/18/2022, 08/06/2023   Influenza,inj,Quad PF,6+ Mos 06/13/2014   Influenza-Unspecified 07/12/2015, 06/09/2017, 06/21/2020   Moderna Covid-19 Fall Seasonal Vaccine 24yrs & older 08/06/2023   PFIZER Comirnaty(Gray Top)Covid-19 Tri-Sucrose Vaccine 01/30/2021, 07/09/2022   PFIZER(Purple Top)SARS-COV-2 Vaccination 12/13/2019, 12/30/2019, 07/12/2020   Pfizer Covid-19 Vaccine Bivalent Booster 88yrs & up 07/17/2021   Pneumococcal Conjugate-13 06/25/2016   Pneumococcal Polysaccharide-23 06/21/2013   Respiratory Syncytial Virus Vaccine,Recomb Aduvanted(Arexvy) 07/09/2022   Td 04/07/2018  4. Cervical cancer screening-past age based screening- never had a normal exam/pap. No bleeding or discharge -had rectocele and enterocele repair 08/25/2021-last GYN visit 10/01/2021 and recovering well and was released 5.  Breast cancer screening-  breast exam -prefers to do self exams and has no lumps - no longer doing mammogram-  past age based screening recommendations per USPTF 6. Colon cancer screening -   past age based screening recommendations.   Reports never had polyps.  No blood in stool.  No melena. 7. Skin cancer screening- no dermatologist- have done cryo a few times on cheek- recommend referral to dermatology- declines . advised regular sunscreen use. Denies worrisome, changing, or new skin lesions.  8. Birth control/STD check-widowed.  Not sexually active.   9. Osteoporosis screening at 71-  declines bone density- prefers to avoid anything that may lead to meds if possible- have discussed reasoning for DEXA but she declines again this year -former smoker-  quit in 1980s- no screenings related to this   Status of chronic or acute concerns   #Permanent atrial fibrillation with secondary hypercoagulable state as a result S: Medication: Eliquis 5 mg twice daily, plan is for rate control strategy and she is on atenolol 100 mg -There was evidence of a remote lacunar infarct when she was  seen in the emergency department in January 2023-could be related to prior A-fib A/P: appropriately anticoagulated and rate controlled- continue current medicine    #hypertension S: medication: Amlodipine 10Mg , Atenolol 100Mg , HCTZ 12.5Mg , losartan 100MG  BP Readings from Last 3 Encounters:  08/12/23 112/70  07/01/23 (!) 146/76  06/29/23 132/76  A/P: stable- continue current medicines   - was cutting 25 mg in half- will send in 12.5 mg dose  #Chronic kidney disease stage III S: GFR is typically in the 50srange -Patient knows to avoid NSAIDs  A/P: just checked last month and stable- continue current medications    #CLL- sees Dr. Bertis Ruddy- just seen last month with stable blood counts   Also has thrombocytopenia related to CLL   #hyperlipidemia S: Medication:Rosuvastatin 5 mg weekly. Patient initially wanted to remain off statin despite remote lacunar infarct on prior imaging January 2023 but later agreed to at least pulsed dose therapy (even if stroke could have been embolic) Lab Results  Component Value Date   CHOL 171 01/07/2023   HDL 67.70 01/07/2023   LDLCALC 81 01/07/2023   LDLDIRECT 81.0 06/25/2022   TRIG 111.0 01/07/2023   CHOLHDL 3 01/07/2023   A/P: offered repeat lipid panel- wants to wait for next visit  # Hyperglycemia/insulin resistance/prediabetes- a1c as high as 6 S:  Medication: None Lab Results  Component Value Date   HGBA1C 6.0 01/07/2023   HGBA1C 6.1 06/25/2022   HGBA1C 6.2 12/25/2021  A/P: improved last time- she prefers to wait until next viist  #MUSCULOSKELETAL- works with Dr. Denyse Amass - on gabapentin 100 mg - sparingly takes - hip injection a month ago- wants to keep doing those when able every 3 monhs or so- they really help  Recommended follow up: Return in about 6 months (around 02/09/2024) for followup or sooner if needed.Schedule b4 you leave. Future Appointments  Date Time Provider Department Center  02/03/2024  8:40 AM Artis Delay, MD [facility]  None  05/23/2024  1:45 PM [facility] ANNUAL WELLNESS VISIT 1 [facility]   Lab/Order associations:NOT fasting- opts out of labs   ICD-10-CM   1. Preventative health care  Z00.00     2. CLL (chronic lymphocytic leukemia) (HCC)  C91.10     3. Essential hypertension, benign  I10     4. Hyperlipidemia, unspecified hyperlipidemia type  E78.5     5. Hyperglycemia  R73.9  6. Screening for diabetes mellitus  Z13.1     7. Permanent atrial fibrillation (HCC)  I48.21       Meds ordered this encounter  Medications   hydrochlorothiazide (HYDRODIURIL) 12.5 MG tablet    Sig: Take 1 tablet (12.5 mg total) by mouth daily.    Dispense:  90 tablet    Refill:  3    Return precautions advised.  Tana Conch, MD

## 2023-08-12 NOTE — Patient Instructions (Addendum)
Opted out of labs for now - check next visit  Recommended follow up: Return in about 6 months (around 02/09/2024) for followup or sooner if needed.Schedule b4 you leave.

## 2023-09-29 NOTE — Progress Notes (Signed)
   I, Maria Lowe, CMA acting as a scribe for Artist Lloyd, MD.  Maria Lowe is a 89 y.o. female who presents to [facility] at [facility] today for cont'd L hip pain. Pt was last seen by Dr. Lloyd on 06/29/23 and was given a L interarticular hip injection.  Today, pt reports continued left hip pain radiating into the left leg. Ambulating with a cane today. Has noticed more swelling in the left leg. Has responded well to steroid injections in the past. Requests repeat injection today.   She will be traveling to [place] in May to attend a river cruise.  Dx imaging: 09/19/2022 L-spine MRI             08/25/2022 L hip XR 08/18/22 L-spine XR  Pertinent review of systems: No fevers or chills  Relevant historical information: Hypertension and atrial fibrillation.   Exam:  BP 110/78   Pulse 75   Ht 5' 1 (1.549 m)   SpO2 98%   BMI 26.72 kg/m  General: Well Developed, well nourished, and in no acute distress.   MSK: Left hip decreased motion.    Lab and Radiology Results  Procedure: Real-time Ultrasound Guided Injection of left hip femoral acetabular joint anterior approach Device: Philips Affiniti 50G/GE Logiq Images permanently stored and available for review in PACS Verbal informed consent obtained.  Discussed risks and benefits of procedure. Warned about infection, bleeding, hyperglycemia damage to structures among others. Patient expresses understanding and agreement Time-out conducted.   Noted no overlying erythema, induration, or other signs of local infection.   Skin prepped in a sterile fashion.   Local anesthesia: Topical Ethyl chloride.   With sterile technique and under real time ultrasound guidance: 40 mg of Kenalog  and 2 mL's of Marcaine injected into hip joint. Fluid seen entering the joint capsule.   Completed without difficulty   Pain immediately resolved suggesting accurate placement of the medication.   Advised to call if fevers/chills,  erythema, induration, drainage, or persistent bleeding.   Images permanently stored and available for review in the ultrasound unit.  Impression: Technically successful ultrasound guided injection.        Assessment and Plan: 89 y.o. female with chronic left hip pain due to DJD.  Plan for repeat steroid injection today.  Previous injection was about 3 months ago.  She will be traveling to [place] in about 3-1/2 months.  Anticipate doing an injection about a week before she leaves.   PDMP not reviewed this encounter. Orders Placed This Encounter  Procedures   US  LIMITED JOINT SPACE STRUCTURES LOW LEFT(NO LINKED CHARGES)    Reason for Exam (SYMPTOM  OR DIAGNOSIS REQUIRED):   left hip pain    Preferred imaging location?:   [facility]   No orders of the defined types were placed in this encounter.    Discussed warning signs or symptoms. Please see discharge instructions. Patient expresses understanding.   The above documentation has been reviewed and is accurate and complete Artist Lloyd, M.D.

## 2023-09-30 ENCOUNTER — Encounter: Payer: Self-pay | Admitting: Family Medicine

## 2023-09-30 ENCOUNTER — Ambulatory Visit: Payer: PPO | Admitting: Family Medicine

## 2023-09-30 ENCOUNTER — Other Ambulatory Visit: Payer: Self-pay

## 2023-09-30 VITALS — BP 110/78 | HR 75 | Ht 61.0 in

## 2023-09-30 DIAGNOSIS — M25552 Pain in left hip: Secondary | ICD-10-CM | POA: Diagnosis not present

## 2023-09-30 DIAGNOSIS — G8929 Other chronic pain: Secondary | ICD-10-CM

## 2023-09-30 NOTE — Patient Instructions (Signed)
 Thank you for coming in today.   You received an injection today. Seek immediate medical attention if the joint becomes red, extremely painful, or is oozing fluid.

## 2023-12-01 NOTE — Progress Notes (Unsigned)
   Rubin Payor, PhD, LAT, ATC acting as a scribe for Clementeen Graham, MD.  Maria Lowe is a 89 y.o. female who presents to [facility] at [facility] today for cont'd L hip pain. Pt was last seen by Dr. Denyse Amass on 09/30/23 and was given a repeat L interarticular hip injection.  Today, pt reports L hip pain returned Friday. She also notes bilat LE swelling.   She will be traveling [place] for 1 month on May 18.  Dx imaging: 09/19/2022 L-spine MRI             08/25/2022 L hip XR 08/18/22 L-spine XR  Pertinent review of systems: No fevers or chills  Relevant historical information: Fibrillation and CKD.   Exam:  BP 130/82   Pulse 94   Ht 5\' 1"  (1.549 m)   Wt 141 lb (64 kg)   SpO2 93%   BMI 26.64 kg/m  General: Well Developed, well nourished, and in no acute distress.   MSK: Left hip normal-appearing decreased range of motion.    Lab and Radiology Results  Procedure: Real-time Ultrasound Guided Injection of left hip femoral acetabular joint anterior approach Device: Philips Affiniti 50G/GE Logiq Images permanently stored and available for review in PACS Verbal informed consent obtained.  Discussed risks and benefits of procedure. Warned about infection, bleeding, hyperglycemia damage to structures among others. Patient expresses understanding and agreement Time-out conducted.   Noted no overlying erythema, induration, or other signs of local infection.   Skin prepped in a sterile fashion.   Local anesthesia: Topical Ethyl chloride.   With sterile technique and under real time ultrasound guidance: 40 mg of Kenalog and 2 mL of Marcaine injected into hip joint. Fluid seen entering the joint capsule.   Completed without difficulty   Pain immediately resolved suggesting accurate placement of the medication.   Advised to call if fevers/chills, erythema, induration, drainage, or persistent bleeding.   Images permanently stored and available for review in the  ultrasound unit.  Impression: Technically successful ultrasound guided injection.        Assessment and Plan: 89 y.o. female with monic left hip pain due to DJD.  Unfortunately the previous steroid injection only lasted about 2 months.  Ideally we want to wait 3 months in between injections.  She has an upcoming important trip in just over 2 months.  I think it is okay to do an injection today and then again about a week before she leaves for [place] in about 2 months from now.  Thereafter we should try to switch back to doing hip injections every 3 months.  The benefit of the injection is worth the potential risk in this context.   PDMP not reviewed this encounter. Orders Placed This Encounter  Procedures   [place] LIMITED JOINT SPACE STRUCTURES LOW LEFT(NO LINKED CHARGES)    Reason for Exam (SYMPTOM  OR DIAGNOSIS REQUIRED):   left hip pain    Preferred imaging location?:   [facility]   No orders of the defined types were placed in this encounter.    Discussed warning signs or symptoms. Please see discharge instructions. Patient expresses understanding.   The above documentation has been reviewed and is accurate and complete Clementeen Graham, M.D.

## 2023-12-02 ENCOUNTER — Ambulatory Visit: Admitting: Family Medicine

## 2023-12-02 ENCOUNTER — Other Ambulatory Visit: Payer: Self-pay

## 2023-12-02 VITALS — BP 130/82 | HR 94 | Ht 61.0 in | Wt 141.0 lb

## 2023-12-02 DIAGNOSIS — M25552 Pain in left hip: Secondary | ICD-10-CM

## 2023-12-02 DIAGNOSIS — G8929 Other chronic pain: Secondary | ICD-10-CM

## 2023-12-02 NOTE — Patient Instructions (Addendum)
 Thank you for coming in today.   You received an injection today. Seek immediate medical attention if the joint becomes red, extremely painful, or is oozing fluid.   Schedule a repeat injection about a week before you leave on your trip

## 2023-12-14 ENCOUNTER — Encounter: Payer: PPO | Admitting: Pharmacist

## 2024-01-27 NOTE — Progress Notes (Signed)
 I, Maria Lowe, CMA acting as a scribe for Maria Juniper, MD.   Maria Lowe is a 89 y.o. female who presents to [facility] at [facility] today for exacerbation of her L hip pain. Pt was last seen by Dr. Alease Hunter on 12/02/23 and was given a repeat L interarticular hip injection.  Today, pt reports falling this past Tuesday, suddenly fell, landing on the buttock and hitting her back on the wall. Notes sharp pain in the left leg, inured the beck and right elbow. Continues to have hip pain. Swelling present in the left leg x the past several weeks. Also c/o weakness in the leg. Traveling to [place] next week for 1 month.   She will be traveling to [place] for a 13-month stay, leaving 5/18.  Pertinent review of systems: No fevers or chills  Relevant historical information: Hypertension    Exam:  BP 134/64   Pulse 83   Ht 5\' 1"  (1.549 m)   SpO2 99%   BMI 26.64 kg/m  General: Well Developed, well nourished, and in no acute distress.   MSK: L-spine normal appearing Nontender palpation spinal midline.  Mildly tender palpation right lumbar paraspinal musculature. Lower extremity strength intact.  Left hip normal-appearing nontender.    Lab and Radiology Results  Procedure: Real-time Ultrasound Guided Injection of left hip femoral acetabular joint anterior approach Device: Philips Affiniti 50G/GE Logiq Images permanently stored and available for review in PACS Verbal informed consent obtained.  Discussed risks and benefits of procedure. Warned about infection, bleeding, hyperglycemia damage to structures among others. Patient expresses understanding and agreement Time-out conducted.   Noted no overlying erythema, induration, or other signs of local infection.   Skin prepped in a sterile fashion.   Local anesthesia: Topical Ethyl chloride.   With sterile technique and under real time ultrasound guidance: 40 mg of Kenalog  and 2 mL of Marcaine injected into left  hip. Fluid seen entering the hip joint.   Completed without difficulty   Pain immediately resolved suggesting accurate placement of the medication.   Advised to call if fevers/chills, erythema, induration, drainage, or persistent bleeding.   Images permanently stored and available for review in the ultrasound unit.  Impression: Technically successful ultrasound guided injection.    X-ray images lumbar spine obtained today personally and independently interpreted. Multilevel DDD.  No acute fractures are visible. Await formal radiology review    Assessment and Plan: 89 y.o. female with chronic left hip pain.  Pain due to DJD plan for repeat steroid injection today prior to her trip to [place] next week.  Back pain after fall due to muscle dysfunction and spasm.  No sign of fracture on x-ray.  Radiology overread is still pending.  I did prescribe prednisone  and tramadol to use during her trip if she needs.   PDMP reviewed during this encounter. Orders Placed This Encounter  Procedures   US  LIMITED JOINT SPACE STRUCTURES LOW LEFT(NO LINKED CHARGES)    Reason for Exam (SYMPTOM  OR DIAGNOSIS REQUIRED):   left hip pain    Preferred imaging location?:   [facility] Lumbar Spine 2-3 Views    Standing Status:   Future    Number of Occurrences:   1    Expiration Date:   02/28/2024    Reason for Exam (SYMPTOM  OR DIAGNOSIS REQUIRED):   fall    Preferred imaging location?:   [facility] ordered this encounter  Medications  predniSONE  (DELTASONE ) 50 MG tablet    Sig: Take 1 tablet (50 mg total) by mouth daily.    Dispense:  5 tablet    Refill:  0   traMADol (ULTRAM) 50 MG tablet    Sig: Take 1 tablet (50 mg total) by mouth every 8 (eight) hours as needed for severe pain (pain score 7-10).    Dispense:  15 tablet    Refill:  0     Discussed warning signs or symptoms. Please see discharge instructions. Patient expresses  understanding.   The above documentation has been reviewed and is accurate and complete Maria Lowe, M.D.

## 2024-01-28 ENCOUNTER — Other Ambulatory Visit: Payer: Self-pay

## 2024-01-28 ENCOUNTER — Ambulatory Visit (INDEPENDENT_AMBULATORY_CARE_PROVIDER_SITE_OTHER)

## 2024-01-28 ENCOUNTER — Ambulatory Visit: Admitting: Family Medicine

## 2024-01-28 ENCOUNTER — Encounter: Payer: Self-pay | Admitting: Family Medicine

## 2024-01-28 VITALS — BP 134/64 | HR 83 | Ht 61.0 in

## 2024-01-28 DIAGNOSIS — M47816 Spondylosis without myelopathy or radiculopathy, lumbar region: Secondary | ICD-10-CM | POA: Diagnosis not present

## 2024-01-28 DIAGNOSIS — M25552 Pain in left hip: Secondary | ICD-10-CM

## 2024-01-28 DIAGNOSIS — G8929 Other chronic pain: Secondary | ICD-10-CM | POA: Diagnosis not present

## 2024-01-28 DIAGNOSIS — M419 Scoliosis, unspecified: Secondary | ICD-10-CM | POA: Diagnosis not present

## 2024-01-28 DIAGNOSIS — M545 Low back pain, unspecified: Secondary | ICD-10-CM

## 2024-01-28 MED ORDER — PREDNISONE 50 MG PO TABS: 50.0000 mg | ORAL_TABLET | Freq: Every day | ORAL | 0 refills | Status: DC

## 2024-01-28 MED ORDER — TRAMADOL HCL 50 MG PO TABS
50.0000 mg | ORAL_TABLET | Freq: Three times a day (TID) | ORAL | 0 refills | Status: DC | PRN
Start: 2024-01-28 — End: 2024-03-13

## 2024-01-28 NOTE — Patient Instructions (Addendum)
 Thank you for coming in today.   You received an injection today. Seek immediate medical attention if the joint becomes red, extremely painful, or is oozing fluid.   Have a LOVELY trip!  Check back in 3 months.

## 2024-01-28 NOTE — Progress Notes (Signed)
 No broken bones were seen on the x-ray.  Arthritis is present.  I hope you have a nice trip.

## 2024-02-01 ENCOUNTER — Ambulatory Visit: Payer: Self-pay

## 2024-02-03 ENCOUNTER — Ambulatory Visit: Payer: PPO | Admitting: Hematology and Oncology

## 2024-02-10 ENCOUNTER — Ambulatory Visit: Payer: PPO | Admitting: Family Medicine

## 2024-02-18 DIAGNOSIS — I5023 Acute on chronic systolic (congestive) heart failure: Secondary | ICD-10-CM | POA: Diagnosis not present

## 2024-03-03 ENCOUNTER — Telehealth: Payer: Self-pay

## 2024-03-03 NOTE — Telephone Encounter (Signed)
 Called and spoke with pt daughter Maria Lowe and she would like for PT rehab to be initiated at her moms visit on 06/23 due to an infection that was aquired in [place].  Copied from CRM [id number]. Topic: General - Other >> Mar 03, 2024 10:30 AM Elita Guitar wrote: Reason for CRM: Maria Lowe, pt's daughter, called and requested to speak with Dr. Lolita Rise nurse to inquire about the possibility of referring her to a rehab facility. Please advise at [phone].

## 2024-03-13 ENCOUNTER — Telehealth: Payer: Self-pay | Admitting: *Deleted

## 2024-03-13 ENCOUNTER — Encounter: Payer: Self-pay | Admitting: Family Medicine

## 2024-03-13 ENCOUNTER — Ambulatory Visit: Payer: Self-pay | Admitting: Family Medicine

## 2024-03-13 ENCOUNTER — Ambulatory Visit (INDEPENDENT_AMBULATORY_CARE_PROVIDER_SITE_OTHER): Admitting: Family Medicine

## 2024-03-13 VITALS — BP 116/60 | HR 60 | Temp 97.7°F | Ht 61.0 in

## 2024-03-13 DIAGNOSIS — R339 Retention of urine, unspecified: Secondary | ICD-10-CM

## 2024-03-13 DIAGNOSIS — D649 Anemia, unspecified: Secondary | ICD-10-CM

## 2024-03-13 DIAGNOSIS — R1902 Left upper quadrant abdominal swelling, mass and lump: Secondary | ICD-10-CM

## 2024-03-13 DIAGNOSIS — E785 Hyperlipidemia, unspecified: Secondary | ICD-10-CM | POA: Diagnosis not present

## 2024-03-13 DIAGNOSIS — I10 Essential (primary) hypertension: Secondary | ICD-10-CM | POA: Diagnosis not present

## 2024-03-13 DIAGNOSIS — R739 Hyperglycemia, unspecified: Secondary | ICD-10-CM

## 2024-03-13 DIAGNOSIS — D696 Thrombocytopenia, unspecified: Secondary | ICD-10-CM | POA: Diagnosis not present

## 2024-03-13 DIAGNOSIS — R29898 Other symptoms and signs involving the musculoskeletal system: Secondary | ICD-10-CM

## 2024-03-13 DIAGNOSIS — I4821 Permanent atrial fibrillation: Secondary | ICD-10-CM | POA: Diagnosis not present

## 2024-03-13 DIAGNOSIS — N183 Chronic kidney disease, stage 3 unspecified: Secondary | ICD-10-CM

## 2024-03-13 DIAGNOSIS — Z131 Encounter for screening for diabetes mellitus: Secondary | ICD-10-CM

## 2024-03-13 DIAGNOSIS — C9110 Chronic lymphocytic leukemia of B-cell type not having achieved remission: Secondary | ICD-10-CM | POA: Diagnosis not present

## 2024-03-13 LAB — COMPREHENSIVE METABOLIC PANEL WITH GFR
ALT: 13 U/L (ref 0–35)
AST: 21 U/L (ref 0–37)
Albumin: 3.1 g/dL — ABNORMAL LOW (ref 3.5–5.2)
Alkaline Phosphatase: 100 U/L (ref 39–117)
BUN: 24 mg/dL — ABNORMAL HIGH (ref 6–23)
CO2: 29 meq/L (ref 19–32)
Calcium: 8.5 mg/dL (ref 8.4–10.5)
Chloride: 94 meq/L — ABNORMAL LOW (ref 96–112)
Creatinine, Ser: 1.27 mg/dL — ABNORMAL HIGH (ref 0.40–1.20)
GFR: 37.17 mL/min — ABNORMAL LOW (ref >60.00)
Glucose, Bld: 162 mg/dL — ABNORMAL HIGH (ref 70–99)
Potassium: 3.9 meq/L (ref 3.5–5.1)
Sodium: 133 meq/L — ABNORMAL LOW (ref 135–145)
Total Bilirubin: 0.6 mg/dL (ref 0.2–1.2)
Total Protein: 5.1 g/dL — ABNORMAL LOW (ref 6.0–8.3)

## 2024-03-13 LAB — CBC WITH DIFFERENTIAL/PLATELET
Basophils Absolute: 0.1 10*3/uL (ref 0.0–0.1)
Basophils Relative: 0.1 % (ref 0.0–3.0)
Eosinophils Absolute: 0.1 10*3/uL (ref 0.0–0.7)
Eosinophils Relative: 0.2 % (ref 0.0–5.0)
HCT: 35.6 % — ABNORMAL LOW (ref 36.0–46.0)
Hemoglobin: 11.5 g/dL — ABNORMAL LOW (ref 12.0–15.0)
Lymphocytes Relative: 81.3 % — ABNORMAL HIGH (ref 12.0–46.0)
Lymphs Abs: 38.6 10*3/uL — ABNORMAL HIGH (ref 0.7–4.0)
MCHC: 32.3 g/dL (ref 30.0–36.0)
MCV: 87.1 fl (ref 78.0–100.0)
Monocytes Absolute: 0.9 10*3/uL (ref 0.1–1.0)
Monocytes Relative: 1.9 % — ABNORMAL LOW (ref 3.0–12.0)
Neutro Abs: 7.8 10*3/uL — ABNORMAL HIGH (ref 1.4–7.7)
Neutrophils Relative %: 16.5 % — ABNORMAL LOW (ref 43.0–77.0)
Platelets: 186 10*3/uL (ref 150.0–400.0)
RBC: 4.09 Mil/uL (ref 3.87–5.11)
RDW: 15 % (ref 11.5–15.5)
WBC: 47.5 10*3/uL (ref 4.0–10.5)

## 2024-03-13 LAB — LIPID PANEL
Cholesterol: 115 mg/dL (ref 0–200)
HDL: 36.3 mg/dL — ABNORMAL LOW (ref >39.00)
LDL Cholesterol: 49 mg/dL (ref 0–99)
NonHDL: 78.62
Total CHOL/HDL Ratio: 3
Triglycerides: 150 mg/dL — ABNORMAL HIGH (ref 0.0–149.0)
VLDL: 30 mg/dL (ref 0.0–40.0)

## 2024-03-13 LAB — IBC + FERRITIN
Ferritin: 179.1 ng/mL (ref 10.0–291.0)
Iron: 38 ug/dL — ABNORMAL LOW (ref 42–145)
Saturation Ratios: 16.3 % — ABNORMAL LOW (ref 20.0–50.0)
TIBC: 233.8 ug/dL — ABNORMAL LOW (ref 250.0–450.0)
Transferrin: 167 mg/dL — ABNORMAL LOW (ref 212.0–360.0)

## 2024-03-13 LAB — HEMOGLOBIN A1C: Hgb A1c MFr Bld: 5.7 % (ref 4.6–6.5)

## 2024-03-13 NOTE — Patient Instructions (Signed)
 Urgent referral to urology today- hoping to get her in later this week if possible for them to remove catheter - they would be equipped to place again if needed  Urgent referral to home health as well  blood pressure very well controlled - we will trial half tablet of amlodipine  so 5 mg only until follow up    Please stop by lab before you go including to get stool cards If you have mychart- we will send your results within 3 business days of us  receiving them.  If you do not have mychart- we will call you about results within 5 business days of us  receiving them.  *please also note that you will see labs on mychart as soon as they post. I will later go in and write notes on them- will say notes from Dr. Katrinka   We will call you within two weeks about your referral to CT scan through [facility].  Their phone number is [phone].  Please call them if you have not heard in 1-2 weeks   Recommended follow up: Schedule at the desk for July 14th at 11 20- they will need to override as hospital follow up slot

## 2024-03-13 NOTE — Telephone Encounter (Signed)
 CRITICAL VALUE STICKER  CRITICAL VALUE: WBC= 47.50  RECEIVER (on-site recipient of call): Neely Cecena    DATE & TIME NOTIFIED: 03/13/2024 at 2:20  MESSENGER (representative from lab): Effie from [facility]  MD NOTIFIED: Dr.Hunter  TIME OF NOTIFICATION: 2:22

## 2024-03-13 NOTE — Telephone Encounter (Signed)
 I am going to discuss with hematology and forward the lab result directly

## 2024-03-13 NOTE — Progress Notes (Addendum)
 Phone [phone] In person visit   Subjective:   Maria Lowe is a 89 y.o. year old very pleasant female patient who presents for/with See problem oriented charting Chief Complaint  Patient presents with   Cough    Had an infection while on vacation   Past Medical History-  Patient Active Problem List   Diagnosis Date Noted   Persistent atrial fibrillation (HCC) 11/29/2020    Priority: High   Thrombocytopenia (HCC) 11/26/2014    Priority: High   CLL (chronic lymphocytic leukemia) (HCC) 11/27/2013    Priority: High   Hyperlipidemia, unspecified 04/07/2018    Priority: Medium    CKD (chronic kidney disease), stage III (HCC) 02/09/2018    Priority: Medium    Hyperglycemia 05/08/2013    Priority: Medium    Essential hypertension, benign 11/07/2012    Priority: Medium    H/O rectocele repair 08/25/2021    Priority: Low   Secondary hypercoagulable state (HCC) 12/10/2020    Priority: Low   Rash 02/09/2018    Priority: Low   Seborrheic dermatitis 01/08/2017    Priority: Low   Former smoker 09/30/2015    Priority: Low   Rectocele 03/29/2015    Priority: Low   Obesity, unspecified 11/07/2012    Priority: Low   Acute left-sided low back pain with left-sided sciatica 06/25/2022    Medications- reviewed and updated Current Outpatient Medications  Medication Sig Dispense Refill   amLODipine  (NORVASC ) 10 MG tablet TAKE 1 TABLET BY MOUTH EVERY DAY 90 tablet 3   apixaban  (ELIQUIS ) 5 MG TABS tablet Take 1 tablet (5 mg total) by mouth 2 (two) times daily. 60 tablet 11   atenolol  (TENORMIN ) 100 MG tablet TAKE 1 TABLET BY MOUTH EVERY DAY 90 tablet 3   gabapentin  (NEURONTIN ) 100 MG capsule TAKE 1 CAPSULE (100 MG TOTAL) BY MOUTH AT BEDTIME AS NEEDED (NERVE PAIN). 60 capsule 3   hydrochlorothiazide  (HYDRODIURIL ) 12.5 MG tablet Take 1 tablet (12.5 mg total) by mouth daily. 90 tablet 3   losartan  (COZAAR ) 100 MG tablet TAKE 1 TABLET BY MOUTH EVERY DAY 90 tablet 3   Multiple  Vitamins-Minerals (ONE-A-DAY WOMENS 50+ PO) Take by mouth daily.     rosuvastatin  (CRESTOR ) 5 MG tablet Take 1 tablet (5 mg total) by mouth once a week. (Patient not taking: Reported on 03/13/2024) 13 tablet 3   traMADol  (ULTRAM ) 50 MG tablet Take 1 tablet (50 mg total) by mouth every 8 (eight) hours as needed for severe pain (pain score 7-10). 15 tablet 0   No current facility-administered medications for this visit.     Objective:  BP 116/60   Pulse 60   Temp 97.7 F (36.5 C)   Ht 5' 1 (1.549 m)   SpO2 97%   BMI 26.64 kg/m  Gen: NAD, resting comfortably CV: RRR no murmurs rubs or gallops Lungs: CTAB no crackles, wheeze, rhonchi Abdomen: suprapubic abdominal mass noted. Urinary catheter in place with clear urine in bag Ext: no edema Skin: warm, dry Neuro:wheelchair bound. 4/5 grip strength on right compared to 5/5 left. Bilateral leg extension 4/5.     Assessment and Plan   # Post hospitalization from [place] S:reports developed cough on vacation to [place] with right arm completely swollen from cat bite weeks before she left - pasturella caused infection. They casted her arm for a week and afterwards she became incredibly week. They left her in bed and in a diaper for 3 weeks and she completely lost strength. She was sent  home with an indwelling catheter- just placed last Wednesday. She has gone from completely mobile to completely immobile.   Apparently one of the doctors noted blood in the stool but family has not seen at home.  Was constipated at first- was given dulcolax and was very helpful at home- had large bowel movement with no blood or melena or mucus. Had loose stools afterwards but now has returned to normal and toileting stool normal.   Son in law was retired occupational therapy and has been helpful. Hoping to trial rehab from home. Has gone from completely ambulatory to essentially wheelchair bound other than a few steps  A/P: Patient was hospitalized in [place] and  went from ambulatory and fully independent to severely deconditioned and essentially wheelchair bound. Hand deconditioned as was casted and not provided occupational therapy for this afterwards. She also was given indwelling catheter for presumed urinary retention but they originally placed it for convenience- urgent referral to urology but we also may need you to remove catheter and do voiding trial at home as long as have equipment to replace if needed   Urgent referral to urology today- hoping to get her in later this week if possible for them to remove catheter - they would be equipped to place again if needed  If any further blood in stool we will work her up further  Apparently had transfusion for anemia- we will check iron stores as well as stool cards - she ideally would want to avoid colonoscopy at her age and with deconditioning- is some risk we would be missing something like colon cancer- she's aware  Also on exam has an abdominal bulge above suprapubic area- this is highly atypical with catheter in place- will scan to rule out malignancy   #Permanent atrial fibrillation with secondary hypercoagulable state as a result S: Medication: Eliquis  5 mg twice daily, plan is for rate control strategy and she is on atenolol  100 mg -There was evidence of a remote lacunar infarct when she was seen in the emergency department in January 2023-could be related to prior A-fib A/P: appropriately anticoagulated and rate controlled- continue current medicine    #hypertension S: medication: Amlodipine  10Mg , Atenolol  100Mg , HCTZ 12.5Mg , losartan  100MG  Lab Results  Component Value Date   CHOL 171 01/07/2023   HDL 67.70 01/07/2023   LDLCALC 81 01/07/2023   LDLDIRECT 81.0 06/25/2022   TRIG 111.0 01/07/2023   CHOLHDL 3 01/07/2023  A/P: blood pressure very well controlled - we will trial half tablet of amlodipine  so 5 mg only until follow up  . Otherwise continue current medications   #Chronic kidney  disease stage III S: GFR is typically in the 50srange -Patient knows to avoid NSAIDs  A/P: with recent hospitalization want post hospital labs- update today   #CLL- per patient  Dr. Lonn released her- had been stable.    Also has thrombocytopenia related to CLL. Update labs today    #hyperlipidemia S: Medication: none, Rosuvastatin  5 mg weekly in past- but has not been on while in [place] -Patient initially wanted to remain off statin despite remote lacunar infarct on prior imaging January 2023 but later agreed to at least pulsed dose therapy (even if stroke could have been embolic) before stopping in 2025 again Lab Results  Component Value Date   CHOL 171 01/07/2023   HDL 67.70 01/07/2023   LDLCALC 81 01/07/2023   LDLDIRECT 81.0 06/25/2022   TRIG 111.0 01/07/2023   CHOLHDL 3 01/07/2023   A/P:  previously  well controlled. she has been off the rosuvastatin  and we will udpate lipids to see where they stand- she would be willing to restart  # Hyperglycemia/insulin resistance/prediabetes- a1c as high as 6 S:  Medication: None Lab Results  Component Value Date   HGBA1C 6.0 01/07/2023   HGBA1C 6.1 06/25/2022   HGBA1C 6.2 12/25/2021  A/P: may be worse with immobility  #MUSCULOSKELETAL- works with Dr. Joane - on gabapentin  100 mg  -left hip arthritis- injections with DrRONITA Joane as needed   Recommended follow up: Return in about 1 month (around 04/12/2024) for followup or sooner if needed.Schedule b4 you leave. Future Appointments  Date Time Provider Department Center  05/23/2024  1:40 PM [facility] ANNUAL WELLNESS VISIT 1 [facility]   Lab/Order associations:   ICD-10-CM   1. Severe muscle deconditioning  R29.898 Ambulatory referral to Home Health    2. Right hand weakness  R29.898 Ambulatory referral to Home Health    3. CLL (chronic lymphocytic leukemia) (HCC)  C91.10     4. Thrombocytopenia (HCC)  D69.6     5. Permanent atrial fibrillation (HCC) Chronic I48.21     6. Stage  3 chronic kidney disease, unspecified whether stage 3a or 3b CKD (HCC)  N18.30     7. Essential hypertension, benign  I10     8. Hyperglycemia  R73.9 Hemoglobin A1c    9. Hyperlipidemia, unspecified hyperlipidemia type  E78.5 Comprehensive metabolic panel with GFR    CBC with Differential/Platelet    Lipid panel    10. Screening for diabetes mellitus  Z13.1 Hemoglobin A1c    11. Urinary retention  R33.9 Ambulatory referral to Urology    Ambulatory referral to Home Health    12. Anemia, unspecified type  D64.9 IBC + Ferritin    Fecal occult blood, imunochemical     No orders of the defined types were placed in this encounter.  Return precautions advised.  Garnette Lukes, MD

## 2024-03-14 ENCOUNTER — Telehealth: Payer: Self-pay | Admitting: Hematology and Oncology

## 2024-03-14 DIAGNOSIS — R338 Other retention of urine: Secondary | ICD-10-CM | POA: Diagnosis not present

## 2024-03-14 NOTE — Telephone Encounter (Signed)
-----   Message from Almarie Bedford sent at 03/14/2024  7:52 AM EDT ----- Erminio,  Can you call and see if she can come in on the week of 7/7? This week and next is overbooked Just return visit, no labs ----- Message ----- From: Katrinka Garnette KIDD, MD Sent: 03/13/2024   4:51 PM EDT To: Almarie Bedford, MD; Katrinka Me  Notes from Dr. Katrinka:   Dr. Bedford she was very ill with infection from cat bite pasturella while in [place] and had prolonged hospital stay- WBC elevation still seems rather significant and in particular lymphocytosis.  She also reported 2 PACKED RED BLOOD CELLS infusions (ferritin ok but could be anemia of chronic disease as well) and is still anemic despite that- I think she was supposed to be seen in May of this  year- can someone from your staff get her back in?  Team if she does not hear from hematology oncology soon can also have her call them directly for follow-up   -Platelets are normal -Some strain on the kidneys from recent hospitalization it appears but hopefully improving-recheck at next blood work.  Nutrition appears poor with low protein and albumin-hopefully that will improve  back home Your cholesterol is honestly reasonably well-controlled but I do want you to restart the rosuvastatin  once you start feeling better You are at risk for diabetes with hemoglobin a1c of 5.7 down from 6.0 (at risk from 5.7-6.4). Healthy eating, regular exercise efforts are recommended.  ----- Message ----- From: Interface, Lab In [facility] One Sent: 03/13/2024   3:11 PM EDT To: Garnette KIDD Katrinka, MD

## 2024-03-14 NOTE — Telephone Encounter (Signed)
 Called and left a message asking for patient/ daughter to call the office back to schedule appt with Dr. Lonn.

## 2024-03-14 NOTE — Telephone Encounter (Signed)
 No further action needed.

## 2024-03-14 NOTE — Telephone Encounter (Signed)
 Called and left a message for son to call the office to schedule appt with Dr. Lonn for his Mom.

## 2024-03-14 NOTE — Telephone Encounter (Signed)
 Pt daughter called in and stated she was not sure when should would be able to schedule and appt for her mom, wanted to get more clarity before. She stated she would call back to schedule

## 2024-03-15 ENCOUNTER — Ambulatory Visit
Admission: RE | Admit: 2024-03-15 | Discharge: 2024-03-15 | Disposition: A | Discharge status: Home / Self Care | Source: Ambulatory Visit | Attending: Family Medicine | Admitting: Family Medicine

## 2024-03-15 DIAGNOSIS — N183 Chronic kidney disease, stage 3 unspecified: Secondary | ICD-10-CM | POA: Diagnosis not present

## 2024-03-15 DIAGNOSIS — Z87891 Personal history of nicotine dependence: Secondary | ICD-10-CM | POA: Diagnosis not present

## 2024-03-15 DIAGNOSIS — I129 Hypertensive chronic kidney disease with stage 1 through stage 4 chronic kidney disease, or unspecified chronic kidney disease: Secondary | ICD-10-CM | POA: Diagnosis not present

## 2024-03-15 DIAGNOSIS — D649 Anemia, unspecified: Secondary | ICD-10-CM

## 2024-03-15 DIAGNOSIS — R1902 Left upper quadrant abdominal swelling, mass and lump: Secondary | ICD-10-CM

## 2024-03-15 DIAGNOSIS — C9110 Chronic lymphocytic leukemia of B-cell type not having achieved remission: Secondary | ICD-10-CM | POA: Diagnosis not present

## 2024-03-15 DIAGNOSIS — Z7901 Long term (current) use of anticoagulants: Secondary | ICD-10-CM | POA: Diagnosis not present

## 2024-03-15 DIAGNOSIS — D631 Anemia in chronic kidney disease: Secondary | ICD-10-CM | POA: Diagnosis not present

## 2024-03-15 DIAGNOSIS — K6389 Other specified diseases of intestine: Secondary | ICD-10-CM | POA: Diagnosis not present

## 2024-03-15 DIAGNOSIS — Z556 Problems related to health literacy: Secondary | ICD-10-CM | POA: Diagnosis not present

## 2024-03-15 DIAGNOSIS — L89623 Pressure ulcer of left heel, stage 3: Secondary | ICD-10-CM | POA: Diagnosis not present

## 2024-03-15 DIAGNOSIS — D630 Anemia in neoplastic disease: Secondary | ICD-10-CM | POA: Diagnosis not present

## 2024-03-15 DIAGNOSIS — D6869 Other thrombophilia: Secondary | ICD-10-CM | POA: Diagnosis not present

## 2024-03-15 DIAGNOSIS — A280 Pasteurellosis: Secondary | ICD-10-CM | POA: Diagnosis not present

## 2024-03-15 DIAGNOSIS — I4821 Permanent atrial fibrillation: Secondary | ICD-10-CM | POA: Diagnosis not present

## 2024-03-15 DIAGNOSIS — M1612 Unilateral primary osteoarthritis, left hip: Secondary | ICD-10-CM | POA: Diagnosis not present

## 2024-03-15 DIAGNOSIS — R339 Retention of urine, unspecified: Secondary | ICD-10-CM | POA: Diagnosis not present

## 2024-03-15 DIAGNOSIS — D696 Thrombocytopenia, unspecified: Secondary | ICD-10-CM | POA: Diagnosis not present

## 2024-03-15 DIAGNOSIS — D3501 Benign neoplasm of right adrenal gland: Secondary | ICD-10-CM | POA: Diagnosis not present

## 2024-03-15 DIAGNOSIS — E785 Hyperlipidemia, unspecified: Secondary | ICD-10-CM | POA: Diagnosis not present

## 2024-03-15 DIAGNOSIS — Z6826 Body mass index (BMI) 26.0-26.9, adult: Secondary | ICD-10-CM | POA: Diagnosis not present

## 2024-03-15 DIAGNOSIS — Z9181 History of falling: Secondary | ICD-10-CM | POA: Diagnosis not present

## 2024-03-15 DIAGNOSIS — E669 Obesity, unspecified: Secondary | ICD-10-CM | POA: Diagnosis not present

## 2024-03-15 MED ORDER — IOPAMIDOL (ISOVUE-300) INJECTION 61%
100.0000 mL | Freq: Once | INTRAVENOUS | Status: AC | PRN
  Administered 2024-03-15: 100 mL via INTRAVENOUS

## 2024-03-17 ENCOUNTER — Telehealth: Payer: Self-pay

## 2024-03-17 NOTE — Telephone Encounter (Signed)
 Called and spoke with [place] and VO provided.  Copied from CRM [id number]. Topic: Clinical - Home Health Verbal Orders >> Mar 17, 2024 11:52 AM Robinson DEL wrote: Caller/Agency: Stephanie/[facility] Callback Number: [phone] Secure line Service Requested: Occupational Therapy Frequency: 1 week 6 Any new concerns about the patient? No

## 2024-03-20 ENCOUNTER — Telehealth: Payer: Self-pay

## 2024-03-20 NOTE — Telephone Encounter (Signed)
 See below.  Copied from CRM [id number]. Topic: General - Other >> Mar 20, 2024 10:05 AM Thersia BROCKS wrote: Reason for CRM: Patient daughter called in stated needs Dr.Hunter to write to the apartment complex, about having to medical terimnate lease because patient is no longer able to live independently   [email or web address]

## 2024-03-21 ENCOUNTER — Telehealth: Payer: Self-pay | Admitting: Family Medicine

## 2024-03-21 NOTE — Telephone Encounter (Signed)
 Received faxed document Home Health Certificate (Order ID [id number]), to be filled out by provider. Patient requested to send it back via Fax. Document is located in providers tray at front office.Please advise

## 2024-03-21 NOTE — Telephone Encounter (Signed)
 Yes thanks- Raeanne can you type up letter   To Whom It May Concern,  I am the treating primary care physician for Maria Lowe (DOB [birth year]), a 89 year old woman with recently developed significant medical debility. Due to her declining physical and functional status, she is no longer able to live independently. It is medically necessary for her to transition to a higher level of care/support, and as such, she must vacate her current apartment.  Please consider this letter as documentation supporting the need for early lease termination on medical grounds.  Sincerely, Garnette Lukes, MD

## 2024-03-21 NOTE — Telephone Encounter (Signed)
Letter has been typed up and faxed ?

## 2024-03-22 NOTE — Telephone Encounter (Signed)
 Form completed and faxed back

## 2024-03-23 DIAGNOSIS — R338 Other retention of urine: Secondary | ICD-10-CM | POA: Diagnosis not present

## 2024-03-31 DIAGNOSIS — Z9181 History of falling: Secondary | ICD-10-CM | POA: Diagnosis not present

## 2024-03-31 DIAGNOSIS — R262 Difficulty in walking, not elsewhere classified: Secondary | ICD-10-CM | POA: Diagnosis not present

## 2024-03-31 DIAGNOSIS — C9110 Chronic lymphocytic leukemia of B-cell type not having achieved remission: Secondary | ICD-10-CM | POA: Diagnosis not present

## 2024-03-31 DIAGNOSIS — D630 Anemia in neoplastic disease: Secondary | ICD-10-CM | POA: Diagnosis not present

## 2024-03-31 DIAGNOSIS — M6281 Muscle weakness (generalized): Secondary | ICD-10-CM | POA: Diagnosis not present

## 2024-04-03 ENCOUNTER — Ambulatory Visit: Admitting: Family Medicine

## 2024-04-03 DIAGNOSIS — R338 Other retention of urine: Secondary | ICD-10-CM | POA: Diagnosis not present

## 2024-04-04 ENCOUNTER — Other Ambulatory Visit: Payer: Self-pay

## 2024-04-04 MED ORDER — GABAPENTIN 100 MG PO CAPS: 100.0000 mg | ORAL_CAPSULE | Freq: Every evening | ORAL | 3 refills | Status: DC | PRN

## 2024-04-13 ENCOUNTER — Ambulatory Visit (INDEPENDENT_AMBULATORY_CARE_PROVIDER_SITE_OTHER): Admitting: Family Medicine

## 2024-04-13 ENCOUNTER — Encounter: Payer: Self-pay | Admitting: Family Medicine

## 2024-04-13 VITALS — BP 110/60 | HR 72 | Temp 98.4°F | Ht 61.0 in

## 2024-04-13 DIAGNOSIS — E785 Hyperlipidemia, unspecified: Secondary | ICD-10-CM

## 2024-04-13 DIAGNOSIS — I4819 Other persistent atrial fibrillation: Secondary | ICD-10-CM | POA: Diagnosis not present

## 2024-04-13 DIAGNOSIS — C9110 Chronic lymphocytic leukemia of B-cell type not having achieved remission: Secondary | ICD-10-CM

## 2024-04-13 DIAGNOSIS — I10 Essential (primary) hypertension: Secondary | ICD-10-CM

## 2024-04-13 DIAGNOSIS — I340 Nonrheumatic mitral (valve) insufficiency: Secondary | ICD-10-CM | POA: Diagnosis not present

## 2024-04-13 DIAGNOSIS — N183 Chronic kidney disease, stage 3 unspecified: Secondary | ICD-10-CM

## 2024-04-13 DIAGNOSIS — R1900 Intra-abdominal and pelvic swelling, mass and lump, unspecified site: Secondary | ICD-10-CM | POA: Diagnosis not present

## 2024-04-13 NOTE — Patient Instructions (Addendum)
 We cut amlodipine  to half tablet last visit per notes to 5 mg from 10 mg. Lets stop it completely.   Make sure you ARE taking hydrochlorothiazide  - let me know updated home medication(s) list  Will reach out about echocardiogram of heart as prior moderate valve issue- could be contributing to swelling  We have placed a referral for you today to oncology- please call their # if you do not hear within a week (may be listed below or you may see mychart message within a few days with #).   Also need to get stool cards from lab  Recommended follow up: Return in about 1 month (around 05/14/2024) for followup or sooner if needed.Schedule b4 you leave.

## 2024-04-13 NOTE — Progress Notes (Signed)
 Phone [phone] In person visit   Subjective:   Maria Lowe is a 89 y.o. year old very pleasant female patient who presents for/with See problem oriented charting Chief Complaint  Patient presents with   1 month f/u    Pt c/o bruising on legs    Past Medical History-  Patient Active Problem List   Diagnosis Date Noted   Persistent atrial fibrillation (HCC) 11/29/2020    Priority: High   Thrombocytopenia (HCC) 11/26/2014    Priority: High   CLL (chronic lymphocytic leukemia) (HCC) 11/27/2013    Priority: High   Hyperlipidemia, unspecified 04/07/2018    Priority: Medium    CKD (chronic kidney disease), stage III (HCC) 02/09/2018    Priority: Medium    Hyperglycemia 05/08/2013    Priority: Medium    Essential hypertension, benign 11/07/2012    Priority: Medium    H/O rectocele repair 08/25/2021    Priority: Low   Secondary hypercoagulable state (HCC) 12/10/2020    Priority: Low   Rash 02/09/2018    Priority: Low   Seborrheic dermatitis 01/08/2017    Priority: Low   Former smoker 09/30/2015    Priority: Low   Rectocele 03/29/2015    Priority: Low   Obesity, unspecified 11/07/2012    Priority: Low   Acute left-sided low back pain with left-sided sciatica 06/25/2022    Medications- reviewed and updated Current Outpatient Medications  Medication Sig Dispense Refill   apixaban  (ELIQUIS ) 5 MG TABS tablet Take 1 tablet (5 mg total) by mouth 2 (two) times daily. 60 tablet 11   atenolol  (TENORMIN ) 100 MG tablet TAKE 1 TABLET BY MOUTH EVERY DAY 90 tablet 3   hydrochlorothiazide  (HYDRODIURIL ) 12.5 MG tablet Take 1 tablet (12.5 mg total) by mouth daily. 90 tablet 3   losartan  (COZAAR ) 100 MG tablet TAKE 1 TABLET BY MOUTH EVERY DAY 90 tablet 3   Multiple Vitamins-Minerals (ONE-A-DAY WOMENS 50+ PO) Take by mouth daily.     rosuvastatin  (CRESTOR ) 5 MG tablet Take 1 tablet (5 mg total) by mouth once a week. 13 tablet 3   No current facility-administered medications for  this visit.     Objective:  BP 110/60   Pulse 72   Temp 98.4 F (36.9 C)   Ht 5' 1 (1.549 m)   SpO2 97%   BMI 26.64 kg/m  Gen: NAD, resting comfortably CV: RRR no murmurs rubs or gallops Lungs: CTAB no crackles, wheeze, rhonchi Ext: 2+ edema pitting but equal Skin: warm, dry     Assessment and Plan   # Severe muscle deconditioning # CLL # Potential abdominal mass S: Patient with prior hospitalization in [place] and went from ambulatory to fully independent to severely deconditioned and essentially wheelchair-bound as of last visit.  And deconditioning due to casting and not being provided physical or occupational therapy afterwards.  She was also given an indwelling catheter for presumed urinary retention but it was essentially placed for convenience. - Last visit we recommended urgent urology referral-fortunately she was able to be seen - Was not having blood in stool at last visit but recommended stool cards with anemia-recommended again today - CBC showed significant WBC elevation and encouraged to follow-up with oncology with known CLL-daughter push this appointment out as patient was not feeling well - Also abdominal bulge on exam we discussed CT findings from last visit 1. Short-segment circumferential wall thickening of distal descending colon in left lower quadrant, in the setting of diverticulosis may represent early diverticulitis. Infiltrative malignancy  cannot be excluded. Correlate with clinical exam and colonoscopy findings. 2. Large stool burden throughout the colon suggestive of constipation. 3. Multilevel compression deformities of vertebral bodies   -Also discussed possible risk of malignancy related compression fractures-patient denies significant back pain even though she had a fall onto her buttocks when she was try to reach something high recently-possibly older compression fractures  -We also referred for home health PT/occupational therapy- physical  therapy once or twice a week, occupational therapy on and off- only one in organization, and nursing weekly- they are understaffed. Occupational therapy actually recommended outpatient occupational therapy and physical therapy.  -working with hand- but hands hurt as well. Able to feed herself thankfully -will not do full step yet but will lift feet more -is able to use rollator  She saw urology and failed first attempt. Next attempt at home but was able to urinate. Doesn't have urge and has to be scheduled  Fiber gummy helpful for bowel movement- less constipation- small volume, soft but oozing- lighter stool color- but eating differently- more fruit  A/P: Severe deconditioning overall and in right hand after this was previously casted-working with PT and OT at home but not getting too many sessions a week-we consider transitioning to outpatient but once if she can get a little stronger first  With CLL history and WBC being elevated last time-recommended oncology follow-up-we will also ask for their opinion on abdominal mass-also strongly considering GI consult particular if there is blood in the stool.  Want to see if oncology would recommend PET scan first -She would not be interested in surgical intervention but might be interested in palliative interventions  With inactivity is having progressive edema-she went down on amlodipine  to 5 mg but we agreed to have that stopped completely as per hypertension section.  She did have moderate mitral regurgitation in the past-we will update echocardiogram.  No calf pain and she is on Eliquis  and swelling is bilateral-I do not think her leg swelling represents DVT -No shortness of breath reported to suggest CHF on 1 hand on the other hand rather sedentary-we did not opt for BNP.  No reported weight gain at home-we need to start weighing her in office that she agrees to try next visit - Also want a make sure she is on hydrochlorothiazide  -Has stopped  gabapentin  so that should not be contributing to swelling   #Permanent atrial fibrillation with secondary hypercoagulable state as a result S: Medication: Eliquis  5 mg twice daily, plan is for rate control strategy and she is on atenolol  100 mg A/P: appropriately anticoagulated and rate controlled- continue current medicine   #hypertension S: medication: Amlodipine  5Mg , Atenolol  100Mg , HCTZ 12.5Mg , losartan  100MG  A/P: Blood pressure well-controlled today-there is a possibility she stopped the hydrochlorothiazide  which could contribute to edema plus she is still on amlodipine -she is going to make sure to take the hydrochlorothiazide  and we are going to stop her amlodipine  today  #Chronic kidney disease stage III S: GFR is typically in the 50srange but was in the 30s last visit -Patient knows to avoid NSAIDs  A/P: Hopefully improved-update today-was worse after hospitalization  #hyperlipidemia S: Medication: Rosuvastatin  5 mg -Patient initially wanted to remain off statin despite remote lacunar infarct on prior imaging January 2023 but later agreed to at least pulsed dose therapy (even if stroke could have been embolic) before stopping in 2025 again  Lab Results  Component Value Date   CHOL 115 03/13/2024   HDL 36.30 (L) 03/13/2024  LDLCALC 49 03/13/2024   LDLDIRECT 81.0 06/25/2022   TRIG 150.0 (H) 03/13/2024   CHOLHDL 3 03/13/2024     A/P: Lipids well-controlled last visit-continue current medication  # Hyperglycemia/insulin resistance/prediabetes- a1c as high as 6 S:  Medication: None  Lab Results  Component Value Date   HGBA1C 5.7 03/13/2024   HGBA1C 6.0 01/07/2023   HGBA1C 6.1 06/25/2022  A/P: Well-controlled last visit with diet control only-recheck after 6 months  Recommended follow up: Return in about 1 month (around 05/14/2024) for followup or sooner if needed.Schedule b4 you leave. Future Appointments  Date Time Provider Department Center  05/23/2024 10:20 AM Katrinka Garnette KIDD, MD [facility]  05/23/2024  1:40 PM [facility] ANNUAL WELLNESS VISIT 1 [facility]    Lab/Order associations:   ICD-10-CM   1. CLL (chronic lymphocytic leukemia) (HCC)  C91.10 Comprehensive metabolic panel with GFR    CBC with Differential/Platelet    Ambulatory referral to Hematology / Oncology    2. Essential hypertension, benign  I10 Comprehensive metabolic panel with GFR    CBC with Differential/Platelet    3. Abdominal mass, unspecified abdominal location  R19.00 Ambulatory referral to Hematology / Oncology    4. Moderate mitral valve regurgitation  I34.0 ECHOCARDIOGRAM COMPLETE    5. Persistent atrial fibrillation (HCC)  I48.19     6. Stage 3 chronic kidney disease, unspecified whether stage 3a or 3b CKD (HCC)  N18.30     7. Hyperlipidemia, unspecified hyperlipidemia type  E78.5       Time Spent: 45 minutes of total time (4:15 PM- 5:00 PM) was spent on the date of the encounter performing the following actions: chart review prior to seeing the patient, obtaining history, performing a medically necessary exam, counseling on the workup and treatment plan, placing orders, and documenting in our EHR.    Return precautions advised.  Garnette Katrinka, MD

## 2024-04-14 ENCOUNTER — Ambulatory Visit: Payer: Self-pay | Admitting: Family Medicine

## 2024-04-14 ENCOUNTER — Encounter: Payer: Self-pay | Admitting: Family Medicine

## 2024-04-14 ENCOUNTER — Telehealth: Payer: Self-pay

## 2024-04-14 ENCOUNTER — Telehealth: Payer: Self-pay | Admitting: Hematology and Oncology

## 2024-04-14 LAB — CBC WITH DIFFERENTIAL/PLATELET
Basophils Absolute: 0.1 K/uL (ref 0.0–0.1)
Basophils Relative: 0.4 % (ref 0.0–3.0)
Eosinophils Absolute: 0.3 K/uL (ref 0.0–0.7)
Eosinophils Relative: 1.3 % (ref 0.0–5.0)
HCT: 34.3 % — ABNORMAL LOW (ref 36.0–46.0)
Hemoglobin: 10.8 g/dL — ABNORMAL LOW (ref 12.0–15.0)
Lymphocytes Relative: 75 % — ABNORMAL HIGH (ref 12.0–46.0)
Lymphs Abs: 21 K/uL — ABNORMAL HIGH (ref 0.7–4.0)
MCHC: 31.4 g/dL (ref 30.0–36.0)
MCV: 88.1 fl (ref 78.0–100.0)
Monocytes Absolute: 0.9 K/uL (ref 0.1–1.0)
Monocytes Relative: 3.3 % (ref 3.0–12.0)
Neutro Abs: 5.6 K/uL (ref 1.4–7.7)
Neutrophils Relative %: 20 % — ABNORMAL LOW (ref 43.0–77.0)
Platelets: 227 K/uL (ref 150.0–400.0)
RBC: 3.89 Mil/uL (ref 3.87–5.11)
RDW: 17 % — ABNORMAL HIGH (ref 11.5–15.5)
WBC: 28 K/uL (ref 4.0–10.5)

## 2024-04-14 LAB — COMPREHENSIVE METABOLIC PANEL WITH GFR
ALT: 11 U/L (ref 0–35)
AST: 17 U/L (ref 0–37)
Albumin: 4 g/dL (ref 3.5–5.2)
Alkaline Phosphatase: 85 U/L (ref 39–117)
BUN: 20 mg/dL (ref 6–23)
CO2: 26 meq/L (ref 19–32)
Calcium: 9.2 mg/dL (ref 8.4–10.5)
Chloride: 105 meq/L (ref 96–112)
Creatinine, Ser: 0.89 mg/dL (ref 0.40–1.20)
GFR: 56.92 mL/min — ABNORMAL LOW (ref >60.00)
Glucose, Bld: 140 mg/dL — ABNORMAL HIGH (ref 70–99)
Potassium: 4.6 meq/L (ref 3.5–5.1)
Sodium: 141 meq/L (ref 135–145)
Total Bilirubin: 0.7 mg/dL (ref 0.2–1.2)
Total Protein: 6.1 g/dL (ref 6.0–8.3)

## 2024-04-14 NOTE — Telephone Encounter (Signed)
 Spoke with karen at [facility] lab about pt white blood count 28.0 verbally told Dr Katrinka stated he would take care of known of CLL white blood count has improved. Dr Katrinka will review  and do result note.

## 2024-04-14 NOTE — Telephone Encounter (Signed)
 Left patient a vm regarding upcoming appointment

## 2024-04-21 ENCOUNTER — Encounter: Payer: Self-pay | Admitting: Hematology and Oncology

## 2024-04-21 ENCOUNTER — Inpatient Hospital Stay: Attending: Hematology and Oncology | Admitting: Hematology and Oncology

## 2024-04-21 VITALS — BP 133/64 | HR 85 | Temp 98.1°F | Resp 18 | Ht 61.0 in | Wt 132.4 lb

## 2024-04-21 DIAGNOSIS — D631 Anemia in chronic kidney disease: Secondary | ICD-10-CM | POA: Insufficient documentation

## 2024-04-21 DIAGNOSIS — K5900 Constipation, unspecified: Secondary | ICD-10-CM | POA: Insufficient documentation

## 2024-04-21 DIAGNOSIS — D539 Nutritional anemia, unspecified: Secondary | ICD-10-CM | POA: Insufficient documentation

## 2024-04-21 DIAGNOSIS — N189 Chronic kidney disease, unspecified: Secondary | ICD-10-CM | POA: Insufficient documentation

## 2024-04-21 DIAGNOSIS — C9110 Chronic lymphocytic leukemia of B-cell type not having achieved remission: Secondary | ICD-10-CM | POA: Diagnosis not present

## 2024-04-21 DIAGNOSIS — E860 Dehydration: Secondary | ICD-10-CM | POA: Insufficient documentation

## 2024-04-21 NOTE — Assessment & Plan Note (Addendum)
 She has frequent dehydration Her daughter is not able to get the patient to drink more than 16 ounces of oral fluid per day I suspect recent abnormal elevated white blood cell count is related to hemoconcentration Recent CT imaging also showed significant stool burden The patient is chronically constipated We discussed importance of increasing oral fluid intake

## 2024-04-21 NOTE — Assessment & Plan Note (Addendum)
 She has multifactorial anemia Component of anemia of chronic kidney disease Even though her serum creatinine is normal, I believe this is not estimating true creatinine clearance for somebody of her age I will check vitamin B12 level in the next visit She does not need treatment for her anemia

## 2024-04-21 NOTE — Assessment & Plan Note (Addendum)
 The patient was diagnosed with early stage CLL since 2015 and was placed on observation  She had recent acute elevated white blood cell count that subsequently improved without treatment The elevated white blood cell count in June coincide with significant elevated serum creatinine so I suspect there is an element of hemoconcentration that does not reflect progression of disease From the CLL perspective, she is not symptomatic Recent CT imaging showed no evidence of lymphadenopathy or splenomegaly She does not need treatment I plan to see her again in a few months for further follow-up

## 2024-04-21 NOTE — Progress Notes (Signed)
 [facility] OFFICE PROGRESS NOTE  [facility] Care Team: Katrinka Garnette KIDD, MD as PCP - General (Family Medicine) Lonn Hicks, MD as Consulting Physician (Hematology and Oncology) Nicholaus Sherlean CROME, [facility] (Inactive) (Pharmacist)  Assessment & Plan CLL (chronic lymphocytic leukemia) [facility]) Maria Lowe was diagnosed with early stage CLL since 2015 and was placed on observation  She had recent acute elevated white blood cell count that subsequently improved without treatment Maria elevated white blood cell count in June coincide with significant elevated serum creatinine so I suspect there is an element of hemoconcentration that does not reflect progression of disease From Maria CLL perspective, she is not symptomatic Recent CT imaging showed no evidence of lymphadenopathy or splenomegaly She does not need treatment I plan to see her again in a few months for further follow-up Deficiency anemia She has multifactorial anemia Component of anemia of chronic kidney disease Even though her serum creatinine is normal, I believe this is not estimating true creatinine clearance for somebody of her age I will check vitamin B12 level in Maria next visit She does not need treatment for her anemia Dehydration She has frequent dehydration Her daughter is not able to get Maria Lowe to drink more than 16 ounces of oral fluid per day I suspect recent abnormal elevated white blood cell count is related to hemoconcentration Recent CT imaging also showed significant stool burden Maria Lowe is chronically constipated We discussed importance of increasing oral fluid intake  Orders Placed This Encounter  Procedures   CMP (Cancer Center only)    Standing Status:   Future    Expiration Date:   04/21/2025   CBC with Differential (Cancer Center Only)    Standing Status:   Future    Expiration Date:   04/21/2025   Vitamin B12    Standing Status:   Future    Expiration Date:   04/21/2025     Hicks Lonn,  MD  INTERVAL HISTORY: she returns for surveillance follow-up for CLL She was lost to follow-up She recently, she moved to stay with her daughter that could provide better care for her Maria Lowe was noted to have significant elevated white blood cell count in June She had imaging study done which showed no evidence of lymphoma or CLL progression She had repeat labs on July 24 which showed reduced white blood cell count at 28,000 with associated anemia On a regular day, she would drink approximately 16 ounces of oral fluid She denies recent infection No new lymphadenopathy  PHYSICAL EXAMINATION: ECOG PERFORMANCE STATUS: 1 - Symptomatic but completely ambulatory  Vitals:   04/21/24 1149  BP: 133/64  Pulse: 85  Resp: 18  Temp: 98.1 F (36.7 C)  SpO2: 95%   Filed Weights   04/21/24 1149  Weight: 132 lb 6.4 oz (60.1 kg)    Relevant data reviewed during this visit included CBC, CMP, CT imaging from June 2025

## 2024-04-23 ENCOUNTER — Other Ambulatory Visit: Payer: Self-pay | Admitting: Family Medicine

## 2024-04-26 NOTE — Progress Notes (Signed)
 Patient referred by PCP 04/13/24 for CLL. Seen by Dr Lonn 04/21/24. Per Dr Lonn no treatment needed at this time will monitor every 6 months.

## 2024-05-11 ENCOUNTER — Ambulatory Visit (HOSPITAL_COMMUNITY)
Admission: RE | Admit: 2024-05-11 | Discharge: 2024-05-11 | Disposition: A | Discharge status: Home / Self Care | Source: Ambulatory Visit | Attending: Family Medicine | Admitting: Family Medicine

## 2024-05-11 DIAGNOSIS — I340 Nonrheumatic mitral (valve) insufficiency: Secondary | ICD-10-CM | POA: Diagnosis not present

## 2024-05-11 LAB — ECHOCARDIOGRAM COMPLETE
AR max vel: 1.29 cm2
AV Area VTI: 1.45 cm2
AV Area mean vel: 1.24 cm2
AV Mean grad: 3.5 mmHg
AV Peak grad: 6.4 mmHg
Ao pk vel: 1.27 m/s
Area-P 1/2: 5.31 cm2
S' Lateral: 2.95 cm

## 2024-05-15 ENCOUNTER — Ambulatory Visit

## 2024-05-15 ENCOUNTER — Other Ambulatory Visit: Payer: Self-pay | Admitting: Family Medicine

## 2024-05-15 DIAGNOSIS — I3139 Other pericardial effusion (noninflammatory): Secondary | ICD-10-CM

## 2024-05-15 DIAGNOSIS — I340 Nonrheumatic mitral (valve) insufficiency: Secondary | ICD-10-CM

## 2024-05-15 DIAGNOSIS — J90 Pleural effusion, not elsewhere classified: Secondary | ICD-10-CM

## 2024-05-22 ENCOUNTER — Other Ambulatory Visit: Payer: Self-pay | Admitting: Family Medicine

## 2024-05-23 ENCOUNTER — Ambulatory Visit: Admitting: Family Medicine

## 2024-05-23 ENCOUNTER — Encounter: Payer: Self-pay | Admitting: Family Medicine

## 2024-05-23 ENCOUNTER — Ambulatory Visit

## 2024-05-23 ENCOUNTER — Ambulatory Visit (INDEPENDENT_AMBULATORY_CARE_PROVIDER_SITE_OTHER): Payer: PPO

## 2024-05-23 VITALS — BP 130/60 | HR 83 | Temp 97.3°F | Ht 61.0 in | Wt 131.0 lb

## 2024-05-23 VITALS — Ht 61.0 in | Wt 131.0 lb

## 2024-05-23 DIAGNOSIS — R29898 Other symptoms and signs involving the musculoskeletal system: Secondary | ICD-10-CM

## 2024-05-23 DIAGNOSIS — I10 Essential (primary) hypertension: Secondary | ICD-10-CM | POA: Diagnosis not present

## 2024-05-23 DIAGNOSIS — Z0000 Encounter for general adult medical examination without abnormal findings: Secondary | ICD-10-CM | POA: Diagnosis not present

## 2024-05-23 DIAGNOSIS — I340 Nonrheumatic mitral (valve) insufficiency: Secondary | ICD-10-CM

## 2024-05-23 DIAGNOSIS — E785 Hyperlipidemia, unspecified: Secondary | ICD-10-CM

## 2024-05-23 DIAGNOSIS — M129 Arthropathy, unspecified: Secondary | ICD-10-CM | POA: Diagnosis not present

## 2024-05-23 DIAGNOSIS — R590 Localized enlarged lymph nodes: Secondary | ICD-10-CM | POA: Diagnosis not present

## 2024-05-23 DIAGNOSIS — J90 Pleural effusion, not elsewhere classified: Secondary | ICD-10-CM

## 2024-05-23 DIAGNOSIS — I4819 Other persistent atrial fibrillation: Secondary | ICD-10-CM | POA: Diagnosis not present

## 2024-05-23 DIAGNOSIS — C9110 Chronic lymphocytic leukemia of B-cell type not having achieved remission: Secondary | ICD-10-CM | POA: Diagnosis not present

## 2024-05-23 DIAGNOSIS — N183 Chronic kidney disease, stage 3 unspecified: Secondary | ICD-10-CM

## 2024-05-23 DIAGNOSIS — R918 Other nonspecific abnormal finding of lung field: Secondary | ICD-10-CM | POA: Diagnosis not present

## 2024-05-23 MED ORDER — APIXABAN 2.5 MG PO TABS
2.5000 mg | ORAL_TABLET | Freq: Two times a day (BID) | ORAL | 5 refills | Status: AC
Start: 2024-05-23 — End: ?

## 2024-05-23 MED ORDER — ROSUVASTATIN CALCIUM 5 MG PO TABS: 5.0000 mg | ORAL_TABLET | ORAL | 3 refills | Status: AC

## 2024-05-23 NOTE — Patient Instructions (Addendum)
 We have placed a referral for you today to occupational therapy at [facility]- please call their # if you do not hear within a week (may be listed below or you may see mychart message within a few days with #).   Call if you have not heard by Friday [facility] [address] [place] [address] [phone]  X-ray today of chest to look at fluid around the lungs  Reduce eliquis  to 2.5 mg twice daily  Restart rosuvastatin  5 mg weekly  Recommended follow up: Return in about 3 months (around 08/22/2024) for followup or sooner if needed.Schedule b4 you leave. -sometime after the visit on December 5th

## 2024-05-23 NOTE — Progress Notes (Signed)
 Subjective:   Maria Lowe is a 89 y.o. who presents for a Medicare Wellness preventive visit.  As a reminder, Annual Wellness Visits don't include a physical exam, and some assessments may be limited, especially if this visit is performed virtually. We may recommend an in-person follow-up visit with your provider if needed.  Visit Complete: Virtual I connected with  Maria Lowe on 05/23/24 by a audio enabled telemedicine application and verified that I am speaking with the correct person using two identifiers.  Patient Location: Home  Provider Location: Office/Clinic  I discussed the limitations of evaluation and management by telemedicine. The patient expressed understanding and agreed to proceed.  Vital Signs: Because this visit was a virtual/telehealth visit, some criteria may be missing or patient reported. Any vitals not documented were not able to be obtained and vitals that have been documented are patient reported.  VideoDeclined- This patient declined Librarian, academic. Therefore the visit was completed with audio only.  Persons Participating in Visit: Patient.  AWV Questionnaire: No: Patient Medicare AWV questionnaire was not completed prior to this visit.  Cardiac Risk Factors include: advanced age (>42men, >80 women);dyslipidemia;hypertension     Objective:    Today's Vitals   05/23/24 1412  Weight: 131 lb (59.4 kg)  Height: 5' 1 (1.549 m)   Body mass index is 24.75 kg/m.     05/23/2024    2:17 PM 05/25/2023    9:57 AM 05/18/2023    1:30 PM 07/03/2022   10:11 AM 09/20/2021   12:32 PM 08/25/2021    9:19 AM 06/20/2021    9:39 AM  Advanced Directives  Does Patient Have a Medical Advance Directive? Yes No No No No No No  Type of Estate agent of [place];Living will        Copy of Healthcare Power of Attorney in Chart? No - copy requested        Would patient like information on creating a medical advance  directive?  No - Patient declined No - Patient declined   No - Patient declined No - Patient declined    Current Medications (verified) Outpatient Encounter Medications as of 05/23/2024  Medication Sig   amLODipine  (NORVASC ) 10 MG tablet TAKE 1 TABLET BY MOUTH EVERY DAY   apixaban  (ELIQUIS ) 2.5 MG TABS tablet Take 1 tablet (2.5 mg total) by mouth 2 (two) times daily.   atenolol  (TENORMIN ) 100 MG tablet TAKE 1 TABLET BY MOUTH EVERY DAY   hydrochlorothiazide  (HYDRODIURIL ) 12.5 MG tablet Take 1 tablet (12.5 mg total) by mouth daily.   losartan  (COZAAR ) 100 MG tablet TAKE 1 TABLET BY MOUTH EVERY DAY   Multiple Vitamins-Minerals (ONE-A-DAY WOMENS 50+ PO) Take by mouth daily.   rosuvastatin  (CRESTOR ) 5 MG tablet Take 1 tablet (5 mg total) by mouth once a week.   No facility-administered encounter medications on file as of 05/23/2024.    Allergies (verified) Patient has no known allergies.   History: Past Medical History:  Diagnosis Date   Anticoagulant long-term use    eliquis -- managed by cardiology   Arthritis    knees   CKD (chronic kidney disease), stage III (HCC)    CLL (chronic lymphocytic leukemia) (HCC) 11/27/2013   oncologist-- dr lonn;  initial dx 03/ 2015, no treatment, active survillance   Essential hypertension    followed by pcp   Full dentures    History of gastric ulcer    stomach ulcer when young   Lymphocytosis 2009  followed oncology   Permanent atrial fibrillation (HCC) 11/2020   cardiologist-- primary dr w. barbaraann and followed by atrial clinic;    dx 003/ 2022, atenolol  for rate control and on eliquis    Rectocele    Thrombocythemia 2009   related to CLL, followed by dr lonn   Urinary incontinence    Vaginal atrophy    Past Surgical History:  Procedure Laterality Date   APPENDECTOMY     1980s   CATARACT EXTRACTION W/ INTRAOCULAR LENS IMPLANT Bilateral 2020   RECTOCELE REPAIR N/A 08/25/2021   Procedure: POSTERIOR REPAIR (RECTOCELE);  Surgeon: Jertson,  Jill Evelyn, MD;  Location: [facility];  Service: Gynecology;  Laterality: N/A;   TONSILLECTOMY AND ADENOIDECTOMY     age 58   Family History  Problem Relation Age of Onset   Healthy Mother        died age 79 (communist country could not get records)   Other Father        died young-unclear cause   Other Sister        died in 22 old age. hard to get answers in [place]   Other Sister        67,  hard to get answers in [place]- possible cancer- rectal or colorectal?   Other Sister        died at 53- unclear cause   Social History   Socioeconomic History   Marital status: Widowed    Spouse name: Not on file   Number of children: 3   Years of education: Not on file   Highest education level: Not on file  Occupational History   Occupation: retired  Tobacco Use   Smoking status: Former    Current packs/day: 0.00    Types: Cigarettes    Start date: 1960    Quit date: 1980    Years since quitting: 45.7   Smokeless tobacco: Never  Vaping Use   Vaping status: Never Used  Substance and Sexual Activity   Alcohol use: Not Currently   Drug use: Never   Sexual activity: Not on file  Other Topics Concern   Not on file  Social History Narrative   Lives alone- cooks Sunday dinner, cares for home and in good shape. Widowed April 2016. 3 children. 7 grandkids. 3 greatgrandkids.   Husband worked as Clinical research associate.       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      Hobbies: travel- husband was buried at home ([place])   Social Drivers of Corporate investment banker Strain: Low Risk  (05/23/2024)   Overall Financial Resource Strain (CARDIA)    Difficulty of Paying Living Expenses: Not hard at all  Food Insecurity: No Food Insecurity (05/23/2024)   Hunger Vital Sign    Worried About Running Out of Food in the Last Year: Never true    Ran Out of Food in the Last Year: Never true  Transportation Needs: No Transportation Needs (05/23/2024)    PRAPARE - Administrator, Civil Service (Medical): No    Lack of Transportation (Non-Medical): No  Physical Activity: Insufficiently Active (05/23/2024)   Exercise Vital Sign    Days of Exercise per Week: 5 days    Minutes of Exercise per Session: 10 min  Stress: No Stress Concern Present (05/23/2024)   [employer] of Occupational Health - Occupational Stress Questionnaire    Feeling of Stress: Not at all  Social Connections: Socially Isolated (05/23/2024)  Social Connection and Isolation Panel    Frequency of Communication with Friends and Family: More than three times a week    Frequency of Social Gatherings with Friends and Family: More than three times a week    Attends Religious Services: Never    Database administrator or Organizations: No    Attends Banker Meetings: Never    Marital Status: Widowed    Tobacco Counseling Counseling given: Not Answered    Clinical Intake:  Pre-visit preparation completed: Yes  Pain : No/denies pain     BMI - recorded: 24.75 Nutritional Status: BMI of 19-24  Normal Nutritional Risks: None Diabetes: No  Lab Results  Component Value Date   HGBA1C 5.7 03/13/2024   HGBA1C 6.0 01/07/2023   HGBA1C 6.1 06/25/2022     How often do you need to have someone help you when you read instructions, pamphlets, or other written materials from your doctor or pharmacy?: 1 - Never  Interpreter Needed?: No  Information entered by :: Ellouise Haws, LPN   Activities of Daily Living     05/23/2024    2:13 PM  In your present state of health, do you have any difficulty performing the following activities:  Hearing? 0  Vision? 0  Difficulty concentrating or making decisions? 0  Walking or climbing stairs? 1  Comment walker  Dressing or bathing? 0  Doing errands, shopping? 0  Preparing Food and eating ? N  Using the Toilet? N  In the past six months, have you accidently leaked urine? Y  Comment wears a pad  Do you  have problems with loss of bowel control? N  Managing your Medications? N  Managing your Finances? N  Housekeeping or managing your Housekeeping? N    Patient Care Team: Katrinka Garnette KIDD, MD as PCP - General (Family Medicine) Lonn Hicks, MD as Consulting Physician (Hematology and Oncology) Nicholaus Sherlean CROME, [facility] (Inactive) (Pharmacist)  I have updated your Care Teams any recent Medical Services you may have received from other providers in the past year.     Assessment:   This is a routine wellness examination for Edwin.  Hearing/Vision screen Hearing Screening - Comments:: Pt denies any hearing issues  Vision Screening - Comments:: Wears rx glasses - up to date with routine eye exams with Dr Octavia    Goals Addressed   None    Depression Screen     05/23/2024    2:15 PM 05/23/2024   10:14 AM 08/12/2023    7:57 AM 05/18/2023    1:29 PM 01/07/2023    8:08 AM 07/03/2022   10:07 AM 06/25/2022    8:24 AM  PHQ 2/9 Scores  PHQ - 2 Score 0 0 0 0 0 0 0  PHQ- 9 Score 0 0 0    0    Fall Risk     05/23/2024    2:16 PM 05/23/2024   10:13 AM 08/12/2023    7:56 AM 05/18/2023    1:32 PM 01/07/2023    8:08 AM  Fall Risk   Falls in the past year? 1 1 0 0 0  Number falls in past yr: 1 1 0 0 0  Injury with Fall? 1 0 0 0 0  Risk for fall due to : Impaired balance/gait;Impaired mobility;History of fall(s) No Fall Risks No Fall Risks Impaired vision No Fall Risks  Follow up Falls prevention discussed Falls evaluation completed Falls evaluation completed Falls prevention discussed Falls evaluation completed  MEDICARE RISK AT HOME:  Medicare Risk at Home Any stairs in or around the home?: No If so, are there any without handrails?: No Home free of loose throw rugs in walkways, pet beds, electrical cords, etc?: Yes Adequate lighting in your home to reduce risk of falls?: Yes Life alert?: Yes Use of a cane, walker or w/c?: Yes Grab bars in the bathroom?: Yes Shower chair or bench in  shower?: Yes Elevated toilet seat or a handicapped toilet?: Yes  TIMED UP AND GO:  Was the test performed?  No  Cognitive Function: 6CIT completed    04/07/2018   10:34 AM  MMSE - Mini Mental State Exam  Not completed: --        05/23/2024    2:17 PM 05/18/2023    1:32 PM 07/03/2022   10:09 AM 06/20/2021    9:42 AM 06/17/2020    9:44 AM  6CIT Screen  What Year? 0 points 0 points 0 points 0 points 0 points  What month? 0 points 0 points 0 points 0 points 0 points  What time? 0 points 0 points 0 points 0 points   Count back from 20 0 points 0 points 0 points 0 points 0 points  Months in reverse 0 points 0 points 0 points 0 points 0 points  Repeat phrase 10 points 0 points 0 points 0 points 6 points  Total Score 10 points 0 points 0 points 0 points     Immunizations Immunization History  Administered Date(s) Administered   Fluad Quad(high Dose 65+) 07/17/2021   INFLUENZA, HIGH DOSE SEASONAL PF 06/25/2016, 06/21/2019, 06/18/2022, 08/06/2023   Influenza Whole 06/30/2013   Influenza,inj,Quad PF,6+ Mos 06/13/2014   Influenza-Unspecified 07/12/2015, 06/09/2017, 06/21/2020   Moderna Covid-19 Fall Seasonal Vaccine 65yrs & older 08/06/2023   PFIZER Comirnaty(Gray Top)Covid-19 Tri-Sucrose Vaccine 01/30/2021, 07/09/2022   PFIZER(Purple Top)SARS-COV-2 Vaccination 12/13/2019, 12/30/2019, 07/12/2020   Pfizer Covid-19 Vaccine Bivalent Booster 1yrs & up 07/17/2021   Pneumococcal Conjugate-13 06/25/2016   Pneumococcal Polysaccharide-23 06/21/2013   Respiratory Syncytial Virus Vaccine,Recomb Aduvanted(Arexvy) 07/09/2022   Td 04/07/2018    Screening Tests Health Maintenance  Topic Date Due   COVID-19 Vaccine (8 - Pfizer risk 2024-25 season) 06/08/2024 (Originally 05/22/2024)   Zoster Vaccines- Shingrix (1 of 2) 07/14/2024 (Originally 06/21/1950)   INFLUENZA VACCINE  12/19/2024 (Originally 04/21/2024)   DEXA SCAN  10/23/2043 (Originally 06/21/1998)   Medicare Annual Wellness (AWV)  05/23/2025    DTaP/Tdap/Td (2 - Tdap) 04/07/2028   Pneumococcal Vaccine: 50+ Years  Completed   HPV VACCINES  Aged Out   Meningococcal B Vaccine  Aged Out    Health Maintenance  There are no preventive care reminders to display for this patient. Health Maintenance Items Addressed: See Nurse Notes at the end of this note  Additional Screening:  Vision Screening: Recommended annual ophthalmology exams for early detection of glaucoma and other disorders of the eye. Would you like a referral to an eye doctor? No    Dental Screening: Recommended annual dental exams for proper oral hygiene  Community Resource Referral / Chronic Care Management: CRR required this visit?  No   CCM required this visit?  No   Plan:    I have personally reviewed and noted the following in the patient's chart:   Medical and social history Use of alcohol, tobacco or illicit drugs  Current medications and supplements including opioid prescriptions. Patient is not currently taking opioid prescriptions. Functional ability and status Nutritional status Physical activity Advanced directives List of  other physicians Hospitalizations, surgeries, and ER visits in previous 12 months Vitals Screenings to include cognitive, depression, and falls Referrals and appointments  In addition, I have reviewed and discussed with patient certain preventive protocols, quality metrics, and best practice recommendations. A written personalized care plan for preventive services as well as general preventive health recommendations were provided to patient.   Ellouise VEAR Haws, LPN   [id number]   After Visit Summary: (MyChart) Due to this being a telephonic visit, the after visit summary with patients personalized plan was offered to patient via MyChart   Notes: PCP Follow Up Recommendations: Pt was unable to recall address however very sharp with questions asked

## 2024-05-23 NOTE — Progress Notes (Signed)
 Phone [phone] In person visit   Subjective:   Maria Lowe is a 89 y.o. year old very pleasant female patient who presents for/with See problem oriented charting Chief Complaint  Patient presents with   Medical Management of Chronic Issues   Chronic Lymphocytic Leukemia   Discussed the use of AI scribe software for clinical note transcription with the patient, who gave verbal consent to proceed.  History of Present Illness   Maria Lowe is a 89 year old female with chronic lymphocytic leukemia who presents for a follow-up visit. She is accompanied by her daughter, who is her primary caregiver.  She experienced severe deconditioning after a hospitalization in [place], resulting in her becoming wheelchair-bound. She works with physical and occupational therapy, though access to adequate therapy at home is challenging due to staffing issues.  At last visit she progressed to using a rollator to move around the house and can manage daily activities but has difficulty with fine motor tasks due to deconditioning in her right hand.  She has chronic lymphocytic leukemia with a recent decrease in white blood cell count from 47.5 thousand to 28 thousand after treatment of infection thankfully. Her anemia has worsened slightly from 11.5 g/dL to 89.1 g/dL, with no signs of bleeding.  She has chronic kidney disease, which worsened two months ago with a filtration rate of 37, but has since improved to 56 with improved hydration.  An echocardiogram revealed severe mitral valve regurgitation, pericardial effusion, and pleural effusion.  She has a history of atrial fibrillation and is on Eliquis  5 mg twice daily for anticoagulation and atenolol  100 mg for rate control. Her blood pressure is well controlled with atenolol , hydrochlorothiazide  12.5 mg, and losartan  100 mg.  For hyperlipidemia, she takes rosuvastatin  5 mg weekly, effectively controlling her cholesterol, with her last LDL at 49 in  June. Her prediabetes has improved with a recent A1c of 5.7.  A recent CT scan showed short segment circumferential wall thickening of the distal descending colon in the left lower quadrant, which could represent diverticulitis or malignancy, but she also had a significant stool burden. She uses fiber gummies for constipation and increases fluid intake.  She discussed this imaging and anemia with oncology-essentially plan was to monitor and hold off on GI referral/colonoscopy/further imaging  Past Medical History-  Patient Active Problem List   Diagnosis Date Noted   Severe mitral regurgitation 05/23/2024    Priority: High   Persistent atrial fibrillation (HCC) 11/29/2020    Priority: High   Thrombocytopenia (HCC) 11/26/2014    Priority: High   CLL (chronic lymphocytic leukemia) (HCC) 11/27/2013    Priority: High   Hyperlipidemia, unspecified 04/07/2018    Priority: Medium    CKD (chronic kidney disease), stage III (HCC) 02/09/2018    Priority: Medium    Hyperglycemia 05/08/2013    Priority: Medium    Essential hypertension, benign 11/07/2012    Priority: Medium    H/O rectocele repair 08/25/2021    Priority: Low   Secondary hypercoagulable state (HCC) 12/10/2020    Priority: Low   Rash 02/09/2018    Priority: Low   Seborrheic dermatitis 01/08/2017    Priority: Low   Former smoker 09/30/2015    Priority: Low   Rectocele 03/29/2015    Priority: Low   Obesity, unspecified 11/07/2012    Priority: Low   Deficiency anemia 04/21/2024   Dehydration 04/21/2024   Acute left-sided low back pain with left-sided sciatica 06/25/2022    Medications- reviewed and  updated Current Outpatient Medications  Medication Sig Dispense Refill   amLODipine  (NORVASC ) 10 MG tablet TAKE 1 TABLET BY MOUTH EVERY DAY 90 tablet 3   apixaban  (ELIQUIS ) 2.5 MG TABS tablet Take 1 tablet (2.5 mg total) by mouth 2 (two) times daily. 60 tablet 5   atenolol  (TENORMIN ) 100 MG tablet TAKE 1 TABLET BY MOUTH EVERY  DAY 90 tablet 3   hydrochlorothiazide  (HYDRODIURIL ) 12.5 MG tablet Take 1 tablet (12.5 mg total) by mouth daily. 90 tablet 3   losartan  (COZAAR ) 100 MG tablet TAKE 1 TABLET BY MOUTH EVERY DAY 90 tablet 3   Multiple Vitamins-Minerals (ONE-A-DAY WOMENS 50+ PO) Take by mouth daily.     rosuvastatin  (CRESTOR ) 5 MG tablet Take 1 tablet (5 mg total) by mouth once a week. 13 tablet 3   No current facility-administered medications for this visit.     Objective:  BP 130/60 (BP Location: Left Arm, Patient Position: Sitting, Cuff Size: Normal)   Pulse 83   Temp (!) 97.3 F (36.3 C) (Temporal)   Ht 5' 1 (1.549 m)   Wt 131 lb (59.4 kg)   SpO2 94%   BMI 24.75 kg/m  Gen: NAD, resting comfortably in chair- mobilizes in rollator- progress from wheelchair at first post hospital trip CV: irregularly irregular  no murmurs rubs or gallops Lungs: CTAB no crackles, wheeze, rhonchi Abdomen: soft/nontender/nondistended/normal bowel sounds. No rebound or guarding.  No longer feeling prior abdominal bulge in lower abdomen Ext: trace edema Skin: warm, dry     Assessment and Plan     # Severe mitral valve regurgitation with pericardial and pleural effusions -On echocardiogram with associated pericardial and pleural effusions, possibly contributing to deconditioning and edema. - Refer to cardiology for evaluation and potential treatment options, including catheter-based valve replacement.  Was already referred at last visit I encouraged her to call to schedule - Discussed informed consent for catheter-based valve replacement as a less invasive alternative to open-heart surgery, with potential for improved strength post-procedure but ultimately she would have to be evaluated to see if she would be a good candidate. - Instruct to go to the emergency department if symptoms worsen before cardiology visit. - Order chest x-ray to assess pleural effusion also noted on echocardiogram.  # Atrial  fibrillation -Reasonably managed with Eliquis  5 mg and atenolol  for rate control. Blood pressure well controlled. - Reduce Eliquis  to 2.5 mg twice daily due to age and weight to minimize bleeding risk.  Especially with recent anemia noted - Continue atenolol  100 mg daily for rate control.  # Chronic lymphocytic leukemia (CLL) with multifactorial anemia and chronic kidney disease stage 3 -CLL with decrease in white blood count from 47.5k to 28k. Anemia slightly worse, likely multifactorial due to CLL and chronic kidney disease. Kidney function improved from a filtration rate of 37 to 56. No signs of bleeding. - Monitor CLL and anemia status. - Recheck blood work on August 25, 2024. -We discussed doing stool cards look for blood in stool especially with prior abnormal CT imaging and area of possible diverticulitis-she would simply like to recheck blood work next visit and at her age likely take a more palliative approach unless that is something that we will significantly improve function  # Deconditioning and right hand weakness after hospitalization and casting Severe deconditioning after hospitalization in [place], with right hand weakness post-casting. Occupational therapy at home was insufficient. - Refer to outpatient occupational therapy, preferably in [place] area. - Encourage use of rollator for mobility.  #  Diverticulosis of colon with prior concern for malignancy -CT findings showed short segment circumferential wall thickening of distal descending colon, possibly diverticulitis or malignancy. Symptoms improved with increased fluid intake and reduced constipation.  Her prior abdominal swelling has resolved.  Was never treated for diverticulitis - Hold off on further evaluation like colonoscopy unless symptoms worsen due to patient's desire for more palliative approach-if anemia worsens or recurrent lower abdominal pain we may need to push for this.  # Constipation Constipation  improved with increased fluid intake and fiber gummies. Less straining and fullness in the lower abdomen reported. - Encourage continued fluid intake and use of fiber gummies.  # Hypertension Hypertension well controlled with current medication regimen: atenolol , hydrochlorothiazide , and losartan . - Continue current antihypertensive regimen: atenolol  100 mg, hydrochlorothiazide  12.5 mg, losartan  100 mg.  # Hyperlipidemia Hyperlipidemia managed with rosuvastatin . LDL was 49 in June, indicating good control. - Restart rosuvastatin  5 mg weekly as apparently she is stopped recently.  # Prediabetes Prediabetes improved with A1c of 5.7.  Too soon for repeat but glad trend has been improving Lab Results  Component Value Date   HGBA1C 5.7 03/13/2024   HGBA1C 6.0 01/07/2023   HGBA1C 6.1 06/25/2022    General Health Maintenance Discussion about COVID-19 vaccination. New vaccine not available until June 21, 2024. - Write prescription for COVID-19 vaccine if needed after June 21, 2024.     Recommended follow up: Return in about 3 months (around 08/22/2024) for followup or sooner if needed.Schedule b4 you leave. Future Appointments  Date Time Provider Department Center  05/25/2024  3:30 PM Joesph Dire, OT [facility] None  05/30/2024  3:30 PM Joesph Dire, OT [facility] None  06/01/2024  4:15 PM Joesph Dire, OT [facility] None  06/05/2024 10:15 AM Joesph Dire, OT [facility] None  06/07/2024 10:15 AM Joesph Dire, OT [facility] None  06/12/2024 10:15 AM Delroy Inocente POUR, OT [facility] None  06/14/2024 10:15 AM Joesph Dire, OT [facility] None  06/20/2024 10:15 AM Joesph Dire, OT [facility] None  06/22/2024  8:45 AM Delroy Inocente POUR, OT [facility] None  06/26/2024 10:15 AM Delroy Inocente POUR, OT [facility] None  06/28/2024 10:15 AM Delroy Inocente POUR, OT [facility] None  07/03/2024  9:30 AM Joesph Dire, OT [facility] None  07/05/2024  9:30 AM Joesph Dire,  OT [facility] None  07/10/2024  9:30 AM Joesph Dire, OT [facility] None  07/12/2024  9:30 AM Joesph Dire, OT [facility] None  07/17/2024  9:30 AM Joesph Dire, OT [facility] None  07/19/2024  9:30 AM Joesph Dire, OT [facility] None  07/24/2024  9:30 AM Joesph Dire, OT [facility] None  07/26/2024  9:30 AM Joesph Dire, OT [facility] None  07/31/2024  9:30 AM Joesph Dire, OT [facility] None  08/02/2024  9:30 AM Joesph Dire, OT [facility] None  08/07/2024  9:30 AM Joesph Dire, OT [facility] None  08/09/2024  9:30 AM Joesph Dire, OT [facility] None  08/14/2024  9:30 AM Joesph Dire, OT [facility] None  08/16/2024  9:30 AM Joesph Dire, OT [facility] None  08/21/2024  9:30 AM Joesph Dire, OT [facility] None  08/23/2024  9:30 AM Joesph Dire, OT [facility] None  08/25/2024  8:30 AM [facility] None  08/25/2024  9:00 AM Lonn Hicks, MD [facility] None  08/28/2024  8:00 AM Katrinka Garnette KIDD, MD [facility]  08/28/2024  9:30 AM Joesph Dire, OT [facility] None  08/30/2024  9:30 AM Joesph Dire, OT [facility] None  09/04/2024  9:30 AM Joesph Dire, OT [facility] None  09/06/2024  9:30 AM Jagentenfl, Elaine, OT [facility] None  05/24/2025  3:00 PM [facility] ANNUAL WELLNESS VISIT 1 [facility] [place]    Lab/Order associations:   ICD-10-CM   1. Right hand weakness  R29.898 Ambulatory referral to Occupational Therapy    2. Pleural effusion  J90 DG Chest 2 View    3. Persistent atrial fibrillation (HCC)  I48.19     4. CLL (chronic lymphocytic leukemia) (HCC)  C91.10     5. Stage 3 chronic kidney disease, unspecified whether stage 3a or 3b CKD (HCC)  N18.30     6. Essential hypertension, benign  I10     7. Hyperlipidemia, unspecified hyperlipidemia type  E78.5     8. Severe mitral regurgitation  I34.0       Meds ordered this encounter  Medications   apixaban  (ELIQUIS ) 2.5 MG  TABS tablet    Sig: Take 1 tablet (2.5 mg total) by mouth 2 (two) times daily.    Dispense:  60 tablet    Refill:  5   rosuvastatin  (CRESTOR ) 5 MG tablet    Sig: Take 1 tablet (5 mg total) by mouth once a week.    Dispense:  13 tablet    Refill:  3    Return precautions advised.  Garnette Lukes, MD

## 2024-05-23 NOTE — Addendum Note (Signed)
 Addended by: KATRINKA GARNETTE KIDD on: 05/23/2024 06:04 PM   Modules accepted: Level of Service

## 2024-05-25 ENCOUNTER — Ambulatory Visit: Attending: Family Medicine | Admitting: Occupational Therapy

## 2024-05-25 DIAGNOSIS — R278 Other lack of coordination: Secondary | ICD-10-CM | POA: Insufficient documentation

## 2024-05-25 DIAGNOSIS — R29898 Other symptoms and signs involving the musculoskeletal system: Secondary | ICD-10-CM | POA: Diagnosis not present

## 2024-05-25 DIAGNOSIS — M6281 Muscle weakness (generalized): Secondary | ICD-10-CM | POA: Insufficient documentation

## 2024-05-25 NOTE — Therapy (Incomplete)
 OUTPATIENT OCCUPATIONAL THERAPY EVALUATION  Patient Name: Maria Lowe MRN: [id number] DOB:September 26, 1930, 89 y.o., female Today's Date: 05/25/2024   REFERRING PROVIDER: Katrinka Senior, MD  END OF SESSION:   OT End of Session - 05/25/24 2308     Visit Number 1    Number of Visits 24    Date for OT Re-Evaluation 08/17/24    OT Start Time 1535    OT Stop Time 1615    OT Time Calculation (min) 40 min    Activity Tolerance Patient tolerated treatment well    Behavior During Therapy [facility] for tasks assessed/performed          Past Medical History:  Diagnosis Date  . Anticoagulant long-term use    eliquis -- managed by cardiology  . Arthritis    knees  . CKD (chronic kidney disease), stage III (HCC)   . CLL (chronic lymphocytic leukemia) (HCC) 11/27/2013   oncologist-- dr lonn;  initial dx 03/ 2015, no treatment, active survillance  . Essential hypertension    followed by pcp  . Full dentures   . History of gastric ulcer    stomach ulcer when young  . Lymphocytosis 2009   followed oncology  . Permanent atrial fibrillation (HCC) 11/2020   cardiologist-- primary dr w. barbaraann and followed by atrial clinic;    dx 003/ 2022, atenolol  for rate control and on eliquis   . Rectocele   . Thrombocythemia 2009   related to CLL, followed by dr lonn  . Urinary incontinence   . Vaginal atrophy    Past Surgical History:  Procedure Laterality Date  . APPENDECTOMY     1980s  . CATARACT EXTRACTION W/ INTRAOCULAR LENS IMPLANT Bilateral 2020  . RECTOCELE REPAIR N/A 08/25/2021   Procedure: POSTERIOR REPAIR (RECTOCELE);  Surgeon: Jertson, Jill Evelyn, MD;  Location: [facility];  Service: Gynecology;  Laterality: N/A;  . TONSILLECTOMY AND ADENOIDECTOMY     age 58   Patient Active Problem List   Diagnosis Date Noted  . Severe mitral regurgitation 05/23/2024  . Deficiency anemia 04/21/2024  . Dehydration 04/21/2024  . Acute left-sided low back pain with left-sided  sciatica 06/25/2022  . H/O rectocele repair 08/25/2021  . Secondary hypercoagulable state (HCC) 12/10/2020  . Persistent atrial fibrillation (HCC) 11/29/2020  . Hyperlipidemia, unspecified 04/07/2018  . CKD (chronic kidney disease), stage III (HCC) 02/09/2018  . Rash 02/09/2018  . Seborrheic dermatitis 01/08/2017  . Former smoker 09/30/2015  . Rectocele 03/29/2015  . Thrombocytopenia (HCC) 11/26/2014  . CLL (chronic lymphocytic leukemia) (HCC) 11/27/2013  . Hyperglycemia 05/08/2013  . Essential hypertension, benign 11/07/2012  . Obesity, unspecified 11/07/2012    ONSET DATE: 01/2024  REFERRING DIAG: Right hand weakness  THERAPY DIAG:  Muscle weakness (generalized)  Other lack of coordination  Rationale for Evaluation and Treatment: Rehabilitation  SUBJECTIVE:   SUBJECTIVE STATEMENT: Pt. reports that she was completely independent prior to her hospitalization in [place] Pt accompanied by: self; with daughter in the reception area  PERTINENT HISTORY:  Pt. Was referred for outpatient OT services for right hand pain. Pt. Had received a cat bite  approximately one week prior to her going on a trip to [place]. She was hospitalized in [place] for one month due to developing a Cat Bite Pasturella Infection. While in the hospital Pt. reports that she was placed in an arm cast to the RUE. Pt. reports that while she was in the hospital she was bedbound, was not allowed to get out of bed for the  entire month, and has become weak, and deconditioned. Pt. Reports that she does not know why she was placed in a cast. [place] the [place] Pt[place] received home health OT and PT services, however OT services were limited due to staffing constraints. Pt.was then referred to outpatient for OT services.  PRECAUTIONS: None  WEIGHT BEARING RESTRICTIONS: No  PAIN:  Are you having pain? 5/10 right hand pain  FALLS: Has patient fallen in last 6 months? 1 fall in the last 6 months  LIVING  ENVIRONMENT: Lives with: Resides with her daughter Lives in: House- one level Stairs: ramped entrance Has following equipment at home: Counselling psychologist, Environmental consultant - 4 wheeled, shower chair, bed side commode, and Grab bars, hospital bed, elevated toilet  PLOF: Independent, living alone, managing a household-per report  PATIENT GOALS:  To be able to cut her meat  OBJECTIVE:  Note: Objective measures were completed at Evaluation unless otherwise noted.  HAND DOMINANCE: Right  ADLs:  Transfers/ambulation related to ADLs: Supervision with a rollator walker Eating: Pt. Has difficulty with using utensils with the right hand, often bringing her head down to meet hand/utensil when eating. Pt. Has  weakness with bringing the cup to her mouth. Difficulty with cutting food. Grooming: Difficulty sustaining the BUE's in elevation long enough to perform hair care. Independent with oral care,  UB Dressing: Independent, requires increased time to complete. Pt. Does endorse having difficulty reaching up to put a shirt on over her head. LB Dressing:Reports being able to perom LE dressing, Uses slide on footwear.  Toileting:Pt. Reports independently being able to perform toileting care needs using her left hand. (Pt. Reports that she has always used her left hand to perform toilet hygiene care.) Bathing: Independent, daughter provides supervision from outside the shower curtain. Daughter assists with drying her back due to limited reach Tub Shower transfers: Daughter provides supervision   IADLs: Light housekeeping: Pt. Reports that she has resumed some light housekeeping tasks, however has been limited. Pt. Reports that she does not vacuum, or sweep due to rollator use. Meal Prep: Pt. Has resumed weekly cooking on Sundays. Reports having difficulty cutting onions, as well as difficulty holding an using a beater. Pt. Reports that she uses a modified technique to hold kitchen/cooking utensils. Community  mobility:  Relies on family  and friends for transportation Medication management: Pt. Has difficulty manipulating medication efficiently with the right hand Handwriting: 50% legible for name only Leisure/Hobbies: Loves to cook; enjoys reading , and working crossword puzzles.  MOBILITY STATUS:  Supervision using a rollator  FUNCTIONAL OUTCOME MEASURES: MAM-20Score: TBD  UPPER EXTREMITY ROM:    {AROM/PROM:27142} ROM Right eval Left eval  Shoulder flexion    Shoulder abduction    Shoulder adduction    Shoulder extension    Shoulder internal rotation    Shoulder external rotation    Elbow flexion    Elbow extension    Wrist flexion    Wrist extension    Wrist ulnar deviation    Wrist radial deviation    Wrist pronation    Wrist supination    (Blank rows = not tested)  UPPER EXTREMITY MMT:     MMT Right eval Left eval  Shoulder flexion    Shoulder abduction    Shoulder adduction    Shoulder extension    Shoulder internal rotation    Shoulder external rotation    Middle trapezius    Lower trapezius    Elbow flexion  Elbow extension    Wrist flexion    Wrist extension    Wrist ulnar deviation    Wrist radial deviation    Wrist pronation    Wrist supination    (Blank rows = not tested)  HAND FUNCTION: {handfunction:27230}  COORDINATION: {otcoordination:27237}  SENSATION: {sensation:27233}  EDEMA: ***  MUSCLE TONE: {UETONE:25567}  COGNITION: Overall cognitive status: {cognition:24006}  VISION: Subjective report: *** Baseline vision: {OTBASELINEVISION:25363} Visual history: {OTVISUALHISTORY:25364}  VISION ASSESSMENT: {visionassessment:27231}  Patient has difficulty with following activities due to following visual impairments: ***  PERCEPTION: {Perception:25564}  PRAXIS: {Praxis:25565}  OBSERVATIONS: ***                                                                                                                             TREATMENT  DATE: ***         PATIENT EDUCATION: Education details: *** Person educated: {Person educated:25204} Education method: {Education Method:25205} Education comprehension: {Education Comprehension:25206}  HOME EXERCISE PROGRAM: ***   GOALS: Goals reviewed with patient? {yes/no:20286}  SHORT TERM GOALS: Target date: ***  *** Baseline: Goal status: INITIAL  2.  *** Baseline:  Goal status: INITIAL  3.  *** Baseline:  Goal status: INITIAL  4.  *** Baseline:  Goal status: INITIAL  5.  *** Baseline:  Goal status: INITIAL  6.  *** Baseline:  Goal status: INITIAL  LONG TERM GOALS: Target date: ***  *** Baseline:  Goal status: INITIAL  2.  *** Baseline:  Goal status: INITIAL  3.  *** Baseline:  Goal status: INITIAL  4.  *** Baseline:  Goal status: INITIAL  5.  *** Baseline:  Goal status: INITIAL  6.  *** Baseline:  Goal status: INITIAL  ASSESSMENT:  CLINICAL IMPRESSION: Patient is a *** y.o. *** who was seen today for occupational therapy evaluation for ***.   PERFORMANCE DEFICITS: in functional skills including {OT physical skills:25468}, cognitive skills including {OT cognitive skills:25469}, and psychosocial skills including {OT psychosocial skills:25470}.   IMPAIRMENTS: are limiting patient from {OT performance deficits:25471}.   CO-MORBIDITIES: {Comorbidities:25485} that affects occupational performance. Patient will benefit from skilled OT to address above impairments and improve overall function.  MODIFICATION OR ASSISTANCE TO COMPLETE EVALUATION: {OT modification:25474}  OT OCCUPATIONAL PROFILE AND HISTORY: {OT PROFILE AND HISTORY:25484}  CLINICAL DECISION MAKING: {OT CDM:25475}  REHAB POTENTIAL: {rehabpotential:25112}  EVALUATION COMPLEXITY: {Evaluation complexity:25115}    PLAN:  OT FREQUENCY: {rehab frequency:25116}  OT DURATION: {rehab duration:25117}  PLANNED INTERVENTIONS: {OT Interventions:25467}  RECOMMENDED  OTHER SERVICES: ***  CONSULTED AND AGREED WITH PLAN OF CARE: {ENR:74513}  PLAN FOR NEXT SESSION: PIERRETTE Richardson Otter, OT 05/25/2024, 11:10 PM

## 2024-05-25 NOTE — Therapy (Signed)
 OUTPATIENT OCCUPATIONAL THERAPY EVALUATION  Patient Name: Maria Lowe MRN: [id number] DOB:November 26, 1930, 89 y.o., female Today's Date: 05/25/2024   REFERRING PROVIDER: Katrinka Senior, MD  END OF SESSION:   Past Medical History:  Diagnosis Date   Anticoagulant long-term use    eliquis -- managed by cardiology   Arthritis    knees   CKD (chronic kidney disease), stage III (HCC)    CLL (chronic lymphocytic leukemia) (HCC) 11/27/2013   oncologist-- dr lonn;  initial dx 03/ 2015, no treatment, active survillance   Essential hypertension    followed by pcp   Full dentures    History of gastric ulcer    stomach ulcer when young   Lymphocytosis 2009   followed oncology   Permanent atrial fibrillation (HCC) 11/2020   cardiologist-- primary dr w. barbaraann and followed by atrial clinic;    dx 003/ 2022, atenolol  for rate control and on eliquis    Rectocele    Thrombocythemia 2009   related to CLL, followed by dr lonn   Urinary incontinence    Vaginal atrophy    Past Surgical History:  Procedure Laterality Date   APPENDECTOMY     1980s   CATARACT EXTRACTION W/ INTRAOCULAR LENS IMPLANT Bilateral 2020   RECTOCELE REPAIR N/A 08/25/2021   Procedure: POSTERIOR REPAIR (RECTOCELE);  Surgeon: Jertson, Jill Evelyn, MD;  Location: [facility];  Service: Gynecology;  Laterality: N/A;   TONSILLECTOMY AND ADENOIDECTOMY     age 69   Patient Active Problem List   Diagnosis Date Noted   Severe mitral regurgitation 05/23/2024   Deficiency anemia 04/21/2024   Dehydration 04/21/2024   Acute left-sided low back pain with left-sided sciatica 06/25/2022   H/O rectocele repair 08/25/2021   Secondary hypercoagulable state (HCC) 12/10/2020   Persistent atrial fibrillation (HCC) 11/29/2020   Hyperlipidemia, unspecified 04/07/2018   CKD (chronic kidney disease), stage III (HCC) 02/09/2018   Rash 02/09/2018   Seborrheic dermatitis 01/08/2017   Former smoker 09/30/2015   Rectocele  03/29/2015   Thrombocytopenia (HCC) 11/26/2014   CLL (chronic lymphocytic leukemia) (HCC) 11/27/2013   Hyperglycemia 05/08/2013   Essential hypertension, benign 11/07/2012   Obesity, unspecified 11/07/2012    ONSET DATE: 01/2024  REFERRING DIAG: Right hand weakness  THERAPY DIAG:  No diagnosis found.  Rationale for Evaluation and Treatment: Rehabilitation  SUBJECTIVE:   SUBJECTIVE STATEMENT: *** Pt accompanied by: self; with daughter in the reception area  PERTINENT HISTORY: ***  PRECAUTIONS: None  WEIGHT BEARING RESTRICTIONS: {Yes ***/No:24003}  PAIN:  Are you having pain? 5/10 right hand pain  FALLS: Has patient fallen in last 6 months? 1 fall in the last 6 months  LIVING ENVIRONMENT: Lives with: Resides with her daughter Lives in: House- one level Stairs: ramped entrance Has following equipment at home: Counselling psychologist, Environmental consultant - 4 wheeled, shower chair, bed side commode, and Grab bars, hospital bed, elevated toilet  PLOF: Independent, living alone, managing a household-per report  PATIENT GOALS:  To be able to cut her meat  OBJECTIVE:  Note: Objective measures were completed at Evaluation unless otherwise noted.  HAND DOMINANCE: Right  ADLs:  Transfers/ambulation related to ADLs: Eating: Pt. Has difficulty with using utensils with the right hand, brings head down to meet hand when eating, weakness with bringing the cup to her mouth. Difficulty with cutting food. Grooming: Difficulty sustaining the BUE in elevation long enough to perform [facility] Dressing: Independent, requires increased time. Has difficulty reaching up to put shirt over her head. LB  Dressing:  Toileting: Independently * Bathing: Independent, daughter supervision from outside shower.Daughter assists with drying Tub Shower transfers: Daughter provides supervision   IADLs: Shopping: *** Light housekeeping: *** Meal Prep:  Difficulty cutting onions, modified position fro cooking  utensils, difficulty holding a Building surveyor mobility:  Relies on family  and friends Medication management: *** Financial management: *** Handwriting: {OTWRITTENEXPRESSION:25361}  MOBILITY STATUS: {OTMOBILITY:25360}  POSTURE COMMENTS:  {posture:25561} Sitting balance: {sitting balance:25483}  ACTIVITY TOLERANCE: Activity tolerance: ***  FUNCTIONAL OUTCOME MEASURES: {OTFUNCTIONALMEASURES:27238}  UPPER EXTREMITY ROM:    {AROM/PROM:27142} ROM Right eval Left eval  Shoulder flexion    Shoulder abduction    Shoulder adduction    Shoulder extension    Shoulder internal rotation    Shoulder external rotation    Elbow flexion    Elbow extension    Wrist flexion    Wrist extension    Wrist ulnar deviation    Wrist radial deviation    Wrist pronation    Wrist supination    (Blank rows = not tested)  UPPER EXTREMITY MMT:     MMT Right eval Left eval  Shoulder flexion    Shoulder abduction    Shoulder adduction    Shoulder extension    Shoulder internal rotation    Shoulder external rotation    Middle trapezius    Lower trapezius    Elbow flexion    Elbow extension    Wrist flexion    Wrist extension    Wrist ulnar deviation    Wrist radial deviation    Wrist pronation    Wrist supination    (Blank rows = not tested)  HAND FUNCTION: {handfunction:27230}  COORDINATION: {otcoordination:27237}  SENSATION: {sensation:27233}  EDEMA: ***  MUSCLE TONE: {UETONE:25567}  COGNITION: Overall cognitive status: {cognition:24006}  VISION: Subjective report: *** Baseline vision: {OTBASELINEVISION:25363} Visual history: {OTVISUALHISTORY:25364}  VISION ASSESSMENT: {visionassessment:27231}  Patient has difficulty with following activities due to following visual impairments: ***  PERCEPTION: {Perception:25564}  PRAXIS: {Praxis:25565}  OBSERVATIONS: ***                                                                                                                              TREATMENT DATE: ***         PATIENT EDUCATION: Education details: *** Person educated: {Person educated:25204} Education method: {Education Method:25205} Education comprehension: {Education Comprehension:25206}  HOME EXERCISE PROGRAM: ***   GOALS: Goals reviewed with patient? {yes/no:20286}  SHORT TERM GOALS: Target date: ***  *** Baseline: Goal status: INITIAL  2.  *** Baseline:  Goal status: INITIAL  3.  *** Baseline:  Goal status: INITIAL  4.  *** Baseline:  Goal status: INITIAL  5.  *** Baseline:  Goal status: INITIAL  6.  *** Baseline:  Goal status: INITIAL  LONG TERM GOALS: Target date: ***  *** Baseline:  Goal status: INITIAL  2.  *** Baseline:  Goal status: INITIAL  3.  *** Baseline:  Goal status: INITIAL  4.  ***  Baseline:  Goal status: INITIAL  5.  *** Baseline:  Goal status: INITIAL  6.  *** Baseline:  Goal status: INITIAL  ASSESSMENT:  CLINICAL IMPRESSION: Patient is a *** y.o. *** who was seen today for occupational therapy evaluation for ***.   PERFORMANCE DEFICITS: in functional skills including {OT physical skills:25468}, cognitive skills including {OT cognitive skills:25469}, and psychosocial skills including {OT psychosocial skills:25470}.   IMPAIRMENTS: are limiting patient from {OT performance deficits:25471}.   CO-MORBIDITIES: {Comorbidities:25485} that affects occupational performance. Patient will benefit from skilled OT to address above impairments and improve overall function.  MODIFICATION OR ASSISTANCE TO COMPLETE EVALUATION: {OT modification:25474}  OT OCCUPATIONAL PROFILE AND HISTORY: {OT PROFILE AND HISTORY:25484}  CLINICAL DECISION MAKING: {OT CDM:25475}  REHAB POTENTIAL: {rehabpotential:25112}  EVALUATION COMPLEXITY: {Evaluation complexity:25115}    PLAN:  OT FREQUENCY: {rehab frequency:25116}  OT DURATION: {rehab duration:25117}  PLANNED INTERVENTIONS: {OT  Interventions:25467}  RECOMMENDED OTHER SERVICES: ***  CONSULTED AND AGREED WITH PLAN OF CARE: {ENR:74513}  PLAN FOR NEXT SESSION: PIERRETTE Richardson Otter, OT 05/25/2024, 3:41 PM

## 2024-05-26 NOTE — Addendum Note (Signed)
 Addended by: Maicy Filip M on: 05/26/2024 03:35 AM   Modules accepted: Orders

## 2024-05-30 ENCOUNTER — Ambulatory Visit: Payer: Self-pay | Admitting: Family Medicine

## 2024-05-30 ENCOUNTER — Ambulatory Visit: Admitting: Occupational Therapy

## 2024-05-30 DIAGNOSIS — M6281 Muscle weakness (generalized): Secondary | ICD-10-CM

## 2024-05-30 NOTE — Therapy (Signed)
 OUTPATIENT OCCUPATIONAL THERAPY TREATMENT NOTE  Patient Name: Novie S Nilsson MRN: [id number] DOB:Dec 27, 1930, 89 y.o., female Today's Date: 05/30/2024   REFERRING PROVIDER: Katrinka Senior, MD  END OF SESSION:   OT End of Session - 05/30/24 2206     Visit Number 2    Number of Visits 24    Date for OT Re-Evaluation 08/17/24    OT Start Time 1530    OT Stop Time 1620    OT Time Calculation (min) 50 min    Activity Tolerance Patient tolerated treatment well    Behavior During Therapy WFL for tasks assessed/performed          Past Medical History:  Diagnosis Date   Anticoagulant long-term use    eliquis -- managed by cardiology   Arthritis    knees   CKD (chronic kidney disease), stage III (HCC)    CLL (chronic lymphocytic leukemia) (HCC) 11/27/2013   oncologist-- dr lonn;  initial dx 03/ 2015, no treatment, active survillance   Essential hypertension    followed by pcp   Full dentures    History of gastric ulcer    stomach ulcer when young   Lymphocytosis 2009   followed oncology   Permanent atrial fibrillation (HCC) 11/2020   cardiologist-- primary dr w. barbaraann and followed by atrial clinic;    dx 003/ 2022, atenolol  for rate control and on eliquis    Rectocele    Thrombocythemia 2009   related to CLL, followed by dr lonn   Urinary incontinence    Vaginal atrophy    Past Surgical History:  Procedure Laterality Date   APPENDECTOMY     1980s   CATARACT EXTRACTION W/ INTRAOCULAR LENS IMPLANT Bilateral 2020   RECTOCELE REPAIR N/A 08/25/2021   Procedure: POSTERIOR REPAIR (RECTOCELE);  Surgeon: Jertson, Jill Evelyn, MD;  Location: [facility];  Service: Gynecology;  Laterality: N/A;   TONSILLECTOMY AND ADENOIDECTOMY     age 44   Patient Active Problem List   Diagnosis Date Noted   Severe mitral regurgitation 05/23/2024   Deficiency anemia 04/21/2024   Dehydration 04/21/2024   Acute left-sided low back pain with left-sided sciatica 06/25/2022    H/O rectocele repair 08/25/2021   Secondary hypercoagulable state (HCC) 12/10/2020   Persistent atrial fibrillation (HCC) 11/29/2020   Hyperlipidemia, unspecified 04/07/2018   CKD (chronic kidney disease), stage III (HCC) 02/09/2018   Rash 02/09/2018   Seborrheic dermatitis 01/08/2017   Former smoker 09/30/2015   Rectocele 03/29/2015   Thrombocytopenia (HCC) 11/26/2014   CLL (chronic lymphocytic leukemia) (HCC) 11/27/2013   Hyperglycemia 05/08/2013   Essential hypertension, benign 11/07/2012   Obesity, unspecified 11/07/2012    ONSET DATE: 01/2024  REFERRING DIAG: Right hand weakness  THERAPY DIAG:  Muscle weakness (generalized)  Rationale for Evaluation and Treatment: Rehabilitation  SUBJECTIVE:   SUBJECTIVE STATEMENT: Pt. And daughter each report they are not sure why her arm was casted while in the hospital in [place], Pt accompanied by: self; with daughter in the reception area  PERTINENT HISTORY:  Pt. was referred for outpatient OT services for right hand pain. Pt. had received a cat bite approximately one week prior to her going on a trip to [place]. She was hospitalized in [place] for one month due to developing a Cat Bite Pasturella Infection. While in the hospital, Pt. reports that she was placed in an arm cast. Pt. reports that while she was in the hospital she was bedbound, was not allowed to get out of bed for the entire  month, and became weak, and deconditioned. Pt. Reports that she does not know why she was placed in a cast. [place] the [place] Pt. started home health OT and PT services, however OT services were limited due to staffing constraints. Pt. was then referred to outpatient OT services. PMHx includes: Severe mitral valve regurgitation, pericadrial, and pleural effusions, A-Fib, Chronic Lymphocytic Anemia, multifactorial anemia and CKD stage III, Diverticulosis, constipation, HTN, Hyperlipidemia, Prediabetes.  PRECAUTIONS: None  WEIGHT BEARING  RESTRICTIONS: No  PAIN:  Are you having pain? 5/10 right hand pain  FALLS: Has patient fallen in last 6 months? 1 fall in the last 6 months  LIVING ENVIRONMENT: Lives with: Resides with her daughter Lives in: House- one level Stairs: ramped entrance Has following equipment at home: Counselling psychologist, Environmental consultant - 4 wheeled, shower chair, bed side commode, and Grab bars, hospital bed, elevated toilet  PLOF: Independent, living alone, managing a household-per report  PATIENT GOALS:  To be able to cut her meat  OBJECTIVE:  Note: Objective measures were completed at Evaluation unless otherwise noted.  HAND DOMINANCE: Right  ADLs:  Transfers/ambulation related to ADLs: Supervision with a rollator walker Eating: Pt. Has difficulty with using utensils with the right hand, often bringing her head down to meet hand/utensil when eating. Pt. Has  weakness with bringing the cup to her mouth. Difficulty with cutting food. Grooming: Difficulty sustaining the BUE's in up long enough to perform hair care. Independent with oral care,  UB Dressing: Independent, requires increased time to complete. Pt. Does endorse having difficulty reaching up to put a shirt on over her head. LB Dressing:Reports being able to perform LE dressing, Uses slide-on footwear.  Toileting:Pt. Reports independently being able to perform toileting care needs using her left hand. (Of note, Pt. Reports that she has always used her left hand to perform toilet hygiene care.) Bathing: Independent, daughter provides supervision from outside the shower curtain. Daughter assists with drying her back due to limited reach Tub Shower transfers: Daughter provides supervision tub/shower t/f's   IADLs: Light housekeeping: Pt. reports that she has resumed some light housekeeping tasks, however this has been limited. Pt. Reports that she does not vacuum, or sweep due to rollator use. Meal Prep: Pt. Has resumed weekly cooking on Sundays.  Reports having difficulty cutting onions, as well as difficulty holding and using a beater. Pt. Reports that she uses a modified technique to hold kitchen/cooking utensils. Community mobility: Pt. Relies on family  and friends for transportation Medication management: Pt. Has difficulty manipulating medication efficiently with the right hand Handwriting: 50% legible for name only Leisure/Hobbies: Loves to cook; enjoys reading , and working crossword puzzles.  MOBILITY STATUS:  Supervision using a rollator  FUNCTIONAL OUTCOME MEASURES: 05/30/24: MAM-20: 11/20 items were completed. The following items Pt. indicated as a 2 -very hard to do: wringing out a towel, opening a wide mouth bottle-previously opened, cutting meat on a plate, tying shoe laces, zipping a jacket, and squeezing toothpaste on a toothbrush. The following items Pt. indicated as a 1-cannot do: opening a medication bottle with a childproof cap, cutting nails with a clipper, buttoning clothing, picking up a 1/2 full water pitcher, and writing 3-4 lines legibly.  UPPER EXTREMITY ROM:     ROM Right eval Left eval  Shoulder flexion 130(150) [place]   Shoulder abduction 82(120) 72(102)  Shoulder adduction    Shoulder extension    Shoulder internal rotation    Shoulder external rotation    Elbow  flexion WFL 148  Elbow extension WFL -60(-28)  Wrist flexion 26(60) WFL  Wrist extension 44(62) WFL  Wrist ulnar deviation    Wrist radial deviation    Wrist pronation    Wrist supination    (Blank rows = not tested)  Eval: Digit flexion to [facility]:    Right: 2nd: 5cm(2cm) 3rd: 0cm  4th: 0cm 5th: 5cm(3cm)     Left: 2nd: 1cm(1cm) 3rd: 2cm(0cm) 4th: 4cm(0cm) 5th: 5cm(3cm)  UPPER EXTREMITY MMT:     MMT Right eval Left eval  Shoulder flexion    Shoulder abduction 3-/5 3-/5  Shoulder adduction    Shoulder extension    Shoulder internal rotation    Shoulder external rotation    Middle trapezius    Lower trapezius    Elbow  flexion 4/5 4/5  Elbow extension 4/5 2+/5  Wrist flexion 3-/5 4-/5  Wrist extension 3-/5 4-/5  Wrist ulnar deviation    Wrist radial deviation    Wrist pronation    Wrist supination    (Blank rows = not tested)  HAND FUNCTION: 05/30/24: Grip strength: Right: 8 lbs; Left: 12 lbs, Lateral pinch: Right: 4 lbs, Left: 3 lbs, and 3 point pinch: Right: 3 lbs, Left: 3 lbs  COORDINATION: 9 Hole Peg test: Right: 48 sec; Left: 49 sec  SENSATION: TBD   EDEMA: N/A    COGNITION: Overall cognitive status: Within functional limits for tasks assessed  VISION: Subjective report: No change from baseline  PERCEPTION: WFL                                                                                                                TREATMENT DATE: 05/30/24  Contrast Bath:  Pt. Tolerated contrasting heat pack for 3 min. followed by cold pack for 1 min. for 3 trials ending with 3 min. of heat for a total of 15 min. to the Right hand 2/2 pain, and stiffness. Contrast bath was performed in preparation for manual therapy.   Manual Therapy:   Pt. Tolerated STM to the volar surface of the right hand, palm, thenar eminence, hypothenar eminence, the volar aspect of the digits, the thumb MCP/IP joints, and the 1st dorsal interossei to increase circulation, and decrease pain.  -gentle carpal spread stretches were performed   Therapeutic Ex:  -AROM  for 2nd through 5th digit MCP, PIP, and DIP flexion followed by passive 2nd, and 5th digit flexion to the end range in preparation for formulating a composite fist.    PATIENT EDUCATION: Education details:  contrast heat/cold;  massage to the right hand Person educated: Patient Education method: Explanation, Demonstration, Tactile cues, and Verbal cues Education comprehension: verbalized understanding, returned demonstration, verbal cues required, tactile cues required, and needs further education  HOME EXERCISE PROGRAM:  -contrasting heat 3 min.,  followed by 1 min. Cold  for 1 min. Ending with heat for 3 min.  -Massage to the right hand    GOALS: Goals reviewed with patient? Yes  SHORT TERM GOALS: Target date: 07/07/2024  Pt. Will be independent with HEPs for UE strength, and coordination Baseline: Eval: No current HEP Goal status: INITIAL   LONG TERM GOALS: Target date: 08/18/2024   To reduce pain by 3 grades on the pain scale in preparation for functional hand use during ADLs/IADLs Baseline: Eval:  5/10 pain in the right hand Goal status: INITIAL  2.  Pt. Will formulate a full composite fist in preparation for securely holding kitchen/cooking utensils. Baseline: Digit flexion to the IER:Mphyu: 2nd: 5cm(2cm) 3rd: 0cm  4th: 0cm 5th: 5cm(3cm) Left: 2nd: 1cm(1cm) 3rd: 2cm(0cm) 4th: 4cm(0cm) 5th: 5cm(3cm) Pt, has difficulty securely holding cooking utensils in her right hand Goal status: INITIAL  3.  Pt. Will efficiently perform hand to mouth patterns during self-feeding with modified independence using her right hand. Baseline: Eval: Pt. Has difficulty completing hand to mouth patterns, often bringing her mouth down to meet the spoon 2/2 weakness in the RUE/hand Goal status: INITIAL  4.  Pt. Will cut meat on her plate efficiently with modified independence Baseline: Eval: Pt. Has difficulty cutting meat Goal status: INITIAL  5.  Pt. Will increase bilateral shoulder abduction by 10 degrees be able to efficiently perform hair care Baseline: Eval: shoulder abduction: Right: 82(120), Left: 72(102) Goal status: INITIAL  6.  Pt. Will write one sentence efficiently  with 75% in preparation for being able to fill in crossword puzzles. Baseline: Eval: writing legibility: 75% for name only. Goal status: INITIAL  ASSESSMENT:  CLINICAL IMPRESSION:  Pt.'s daughter accompanied the Pt. for the OT session this afternoon. Both the Pt. And daughter both confirm that they have no idea why her right arm was placed in a cast  while  in the hospital in [place]. Pt. Tolerated contrasting heat/cold today for pain and stiffness, as well as massage and ROM  to the digits.  Pt. Presented with 5/10 pain in the right hand initially. Following treatment, Pt. reported 3-5/10 pain. Measurements for grip strength, pinch strength, and [facility] skills were obtained. The measurement results revealed weakness in bilateral hand grip and pinch strength, as well as  impaired Bilateral [facility] skills which significantly impact her ability to use her hands efficiently during daily tasks. Pt. continues benefit from OT services to work on improving right hand pain, and to improve overall  BUE, functioning in order to increase engagement of the UEs during ADLs, and IADL tasks.   PERFORMANCE DEFICITS: in functional skills including ADLs, IADLs, coordination, dexterity, ROM, strength, pain, Fine motor control, Gross motor control, and UE functional use, , and psychosocial skills including coping strategies, environmental adaptation, habits, interpersonal interactions, and routines and behaviors.   IMPAIRMENTS: are limiting patient from ADLs, IADLs, and leisure.   CO-MORBIDITIES: may have co-morbidities  that affects occupational performance. Patient will benefit from skilled OT to address above impairments and improve overall function.  MODIFICATION OR ASSISTANCE TO COMPLETE EVALUATION: Min-Moderate modification of tasks or assist with assess necessary to complete an evaluation.  OT OCCUPATIONAL PROFILE AND HISTORY: Detailed assessment: Review of records and additional review of physical, cognitive, psychosocial history related to current functional performance.  CLINICAL DECISION MAKING: Moderate - several treatment options, min-mod task modification necessary  REHAB POTENTIAL: Good  EVALUATION COMPLEXITY: Moderate    PLAN:  OT FREQUENCY: 2x/week  OT DURATION: 12 weeks  PLANNED INTERVENTIONS: 97168 OT Re-evaluation, 97535 self care/ADL training, 02889  therapeutic exercise, 97530 therapeutic activity, 97112 neuromuscular re-education, 97140 manual therapy, 97018 paraffin, 02989 moist heat, 97010 cryotherapy, 97034 contrast bath, 97760 Orthotic Initial, [id number] Orthotic/Prosthetic  subsequent, passive range of motion, balance training, functional mobility training, energy conservation, coping strategies training, patient/family education, and DME and/or AE instructions  RECOMMENDED OTHER SERVICES: PT for balance  CONSULTED AND AGREED WITH PLAN OF CARE: Patient  PLAN FOR NEXT SESSION:  Assess sensation, and hand function skills at the next visit, as well administer the MAM-20 outcome measure; treatment  Richardson Otter, MS, OTR/L  05/30/2024, 10:19 PM

## 2024-06-01 ENCOUNTER — Ambulatory Visit: Admitting: Occupational Therapy

## 2024-06-01 DIAGNOSIS — M6281 Muscle weakness (generalized): Secondary | ICD-10-CM

## 2024-06-01 NOTE — Therapy (Signed)
 OUTPATIENT OCCUPATIONAL THERAPY TREATMENT NOTE  Patient Name: Maria Lowe MRN: [id number] DOB:1931/08/20, 89 y.o., female Today's Date: 06/01/2024   REFERRING PROVIDER: Katrinka Senior, MD  END OF SESSION:   OT End of Session - 06/01/24 1726     Visit Number 3    Number of Visits 24    Date for OT Re-Evaluation 08/17/24    OT Start Time 1618    OT Stop Time 1705    OT Time Calculation (min) 47 min    Activity Tolerance Patient tolerated treatment well    Behavior During Therapy WFL for tasks assessed/performed          Past Medical History:  Diagnosis Date   Anticoagulant long-term use    eliquis -- managed by cardiology   Arthritis    knees   CKD (chronic kidney disease), stage III (HCC)    CLL (chronic lymphocytic leukemia) (HCC) 11/27/2013   oncologist-- dr lonn;  initial dx 03/ 2015, no treatment, active survillance   Essential hypertension    followed by pcp   Full dentures    History of gastric ulcer    stomach ulcer when young   Lymphocytosis 2009   followed oncology   Permanent atrial fibrillation (HCC) 11/2020   cardiologist-- primary dr w. barbaraann and followed by atrial clinic;    dx 003/ 2022, atenolol  for rate control and on eliquis    Rectocele    Thrombocythemia 2009   related to CLL, followed by dr lonn   Urinary incontinence    Vaginal atrophy    Past Surgical History:  Procedure Laterality Date   APPENDECTOMY     1980s   CATARACT EXTRACTION W/ INTRAOCULAR LENS IMPLANT Bilateral 2020   RECTOCELE REPAIR N/A 08/25/2021   Procedure: POSTERIOR REPAIR (RECTOCELE);  Surgeon: Jertson, Jill Evelyn, MD;  Location: [facility];  Service: Gynecology;  Laterality: N/A;   TONSILLECTOMY AND ADENOIDECTOMY     age 43   Patient Active Problem List   Diagnosis Date Noted   Severe mitral regurgitation 05/23/2024   Deficiency anemia 04/21/2024   Dehydration 04/21/2024   Acute left-sided low back pain with left-sided sciatica 06/25/2022    H/O rectocele repair 08/25/2021   Secondary hypercoagulable state (HCC) 12/10/2020   Persistent atrial fibrillation (HCC) 11/29/2020   Hyperlipidemia, unspecified 04/07/2018   CKD (chronic kidney disease), stage III (HCC) 02/09/2018   Rash 02/09/2018   Seborrheic dermatitis 01/08/2017   Former smoker 09/30/2015   Rectocele 03/29/2015   Thrombocytopenia (HCC) 11/26/2014   CLL (chronic lymphocytic leukemia) (HCC) 11/27/2013   Hyperglycemia 05/08/2013   Essential hypertension, benign 11/07/2012   Obesity, unspecified 11/07/2012    ONSET DATE: 01/2024  REFERRING DIAG: Right hand weakness  THERAPY DIAG:  Muscle weakness (generalized)  Rationale for Evaluation and Treatment: Rehabilitation  SUBJECTIVE:   SUBJECTIVE STATEMENT: Pt. Reports that she has only been using heat for her right hand at home, instead of doing contrasting/heat and cold. Pt. Reports that she does not care to use cold at home. Pt accompanied by: self; with daughter in the reception area  PERTINENT HISTORY:  Pt. was referred for outpatient OT services for right hand pain. Pt. had received a cat bite approximately one week prior to her going on a trip to [place]. She was hospitalized in [place] for one month due to developing a Cat Bite Pasturella Infection. While in the hospital, Pt. reports that she was placed in an arm cast. Pt. reports that while she was in the hospital she  was bedbound, was not allowed to get out of bed for the entire month, and became weak, and deconditioned. Pt. Reports that she does not know why she was placed in a cast. [place] return to the [place] Pt. started home health OT and PT services, however OT services were limited due to staffing constraints. Pt. was then referred to outpatient OT services. PMHx includes: Severe mitral valve regurgitation, pericadrial, and pleural effusions, A-Fib, Chronic Lymphocytic Anemia, multifactorial anemia and CKD stage III, Diverticulosis, constipation, HTN,  Hyperlipidemia, Prediabetes.  PRECAUTIONS: None  WEIGHT BEARING RESTRICTIONS: No  PAIN:  Are you having pain? 1-3/10 intermittent right hand pain  FALLS: Has patient fallen in last 6 months? 1 fall in the last 6 months  LIVING ENVIRONMENT: Lives with: Resides with her daughter Lives in: House- one level Stairs: ramped entrance Has following equipment at home: Counselling psychologist, Environmental consultant - 4 wheeled, shower chair, bed side commode, and Grab bars, hospital bed, elevated toilet  PLOF: Independent, living alone, managing a household-per report  PATIENT GOALS:  To be able to cut her meat  OBJECTIVE:  Note: Objective measures were completed at Evaluation unless otherwise noted.  HAND DOMINANCE: Right  ADLs:  Transfers/ambulation related to ADLs: Supervision with a rollator walker Eating: Pt. Has difficulty with using utensils with the right hand, often bringing her head down to meet hand/utensil when eating. Pt. Has  weakness with bringing the cup to her mouth. Difficulty with cutting food. Grooming: Difficulty sustaining the BUE's in up long enough to perform hair care. Independent with oral care,  UB Dressing: Independent, requires increased time to complete. Pt. Does endorse having difficulty reaching up to put a shirt on over her head. LB Dressing:Reports being able to perform LE dressing, Uses slide-on footwear.  Toileting:Pt. Reports independently being able to perform toileting care needs using her left hand. (Of note, Pt. Reports that she has always used her left hand to perform toilet hygiene care.) Bathing: Independent, daughter provides supervision from outside the shower curtain. Daughter assists with drying her back due to limited reach Tub Shower transfers: Daughter provides supervision tub/shower t/f's   IADLs: Light housekeeping: Pt. reports that she has resumed some light housekeeping tasks, however this has been limited. Pt. Reports that she does not vacuum, or  sweep due to rollator use. Meal Prep: Pt. Has resumed weekly cooking on Sundays. Reports having difficulty cutting onions, as well as difficulty holding and using a beater. Pt. Reports that she uses a modified technique to hold kitchen/cooking utensils. Community mobility: Pt. Relies on family  and friends for transportation Medication management: Pt. Has difficulty manipulating medication efficiently with the right hand Handwriting: 50% legible for name only Leisure/Hobbies: Loves to cook; enjoys reading , and working crossword puzzles.  MOBILITY STATUS:  Supervision using a rollator  FUNCTIONAL OUTCOME MEASURES: 05/30/24: MAM-20: 11/20 items were completed. The following items Pt. indicated as a 2 -very hard to do: wringing out a towel, opening a wide mouth bottle-previously opened, cutting meat on a plate, tying shoe laces, zipping a jacket, and squeezing toothpaste on a toothbrush. The following items Pt. indicated as a 1-cannot do: opening a medication bottle with a childproof cap, cutting nails with a clipper, buttoning clothing, picking up a 1/2 full water pitcher, and writing 3-4 lines legibly.  UPPER EXTREMITY ROM:     ROM Right eval Left eval  Shoulder flexion 130(150) Scaption   Shoulder abduction 82(120) 72(102)  Shoulder adduction    Shoulder extension  Shoulder internal rotation    Shoulder external rotation    Elbow flexion WFL 148  Elbow extension WFL -60(-28)  Wrist flexion 26(60) WFL  Wrist extension 44(62) WFL  Wrist ulnar deviation    Wrist radial deviation    Wrist pronation    Wrist supination    (Blank rows = not tested)  Eval: Digit flexion to the [facility]:    Right: 2nd: 5cm(2cm) 3rd: 0cm  4th: 0cm 5th: 5cm(3cm)     Left: 2nd: 1cm(1cm) 3rd: 2cm(0cm) 4th: 4cm(0cm) 5th: 5cm(3cm)  UPPER EXTREMITY MMT:     MMT Right eval Left eval  Shoulder flexion    Shoulder abduction 3-/5 3-/5  Shoulder adduction    Shoulder extension    Shoulder internal rotation     Shoulder external rotation    Middle trapezius    Lower trapezius    Elbow flexion 4/5 4/5  Elbow extension 4/5 2+/5  Wrist flexion 3-/5 4-/5  Wrist extension 3-/5 4-/5  Wrist ulnar deviation    Wrist radial deviation    Wrist pronation    Wrist supination    (Blank rows = not tested)  HAND FUNCTION: 05/30/24: Grip strength: Right: 8 lbs; Left: 12 lbs, Lateral pinch: Right: 4 lbs, Left: 3 lbs, and 3 point pinch: Right: 3 lbs, Left: 3 lbs  COORDINATION: 9 Hole Peg test: Right: 48 sec; Left: 49 sec  SENSATION: TBD   EDEMA: N/A    COGNITION: Overall cognitive status: Within functional limits for tasks assessed  VISION: Subjective report: No change from baseline  PERCEPTION: WFL                                                                                                                TREATMENT DATE: 06/01/24  Contrast Bath:  Pt. tolerated contrasting heat pack for 3 min. followed by cold pack for 1 min. for 3 trials ending with 3 min. of heat for a total of 15 min. to the Right hand 2/2 pain, and stiffness. Contrast bath was performed in preparation for manual therapy, and there. Ex.  Manual Therapy:   Pt. tolerated STM to the volar surface of the right hand, palm, thenar eminence, hypothenar eminence, the volar aspect of the digits, the thumb MCP/IP joints, and the 1st dorsal interossei to increase circulation, and decrease pain. -gentle carpal spread stretches were performed  -gentle metacarpal spread stretches were performed -Manual therapy was performed independent of, and in preparation for therapeutic Ex.  Therapeutic Ex:  Right:   -AROM  for the right 2nd through 5th digit MCP, PIP, and DIP flexion followed by passive 2nd, and 5th digit flexion to the end range in preparation for formulating a composite fist. -right hand 2nd through 5th digit extension reps were performed with hand placed flat at the tabletop surface while stabilizing the digits on each side  of the one extending.  -Active intrinsic stretches- with assist required,and cues to maintain the wrist in neutral and avoiding wrist extension during the stretches.  -Blue level resistive foam  block for gross gripping, lateral, and 3pt. Pinch.   Left: -Passive stretching for Left 5th digit MP, PIP, and DIP flexion in preparation for formulating a composite fist following moist heat -Lateral, and 3pt. Pinch strengthening was performed with yellow, and red resistive clips.  -Blue level resistive foam block  was utilized for gross gripping, lateral, and 3pt. pinch     PATIENT EDUCATION: Education details:  contrast heat/cold;  massage to the right hand, HEP progression Person educated: Patient Education method: Explanation, Demonstration, Tactile cues, and Verbal cues Education comprehension: verbalized understanding, returned demonstration, verbal cues required, tactile cues required, and needs further education  HOME EXERCISE PROGRAM:  -contrasting heat 3 min., followed by 1 min. Cold  for 1 min. Ending with heat for 3 min. To the right hand. (Pt. Prefers just heat at home), moist heat to the left hand. -Massage to the right hand  -Right hand digit extension with the hand flat at the tabletop surface -Blue resistive foam block for Bilateral gross gripping, lateral pinch, and 3pt. Pinch strength    GOALS: Goals reviewed with patient? Yes  SHORT TERM GOALS: Target date: 07/07/2024     Pt. Will be independent with HEPs for UE strength, and coordination Baseline: Eval: No current HEP Goal status: INITIAL   LONG TERM GOALS: Target date: 08/18/2024   To reduce pain by 3 grades on the pain scale in preparation for functional hand use during ADLs/IADLs Baseline: Eval:  5/10 pain in the right hand Goal status: INITIAL  2.  Pt. Will formulate a full composite fist in preparation for securely holding kitchen/cooking utensils. Baseline: Digit flexion to the IER:Mphyu: 2nd: 5cm(2cm)  3rd: 0cm  4th: 0cm 5th: 5cm(3cm) Left: 2nd: 1cm(1cm) 3rd: 2cm(0cm) 4th: 4cm(0cm) 5th: 5cm(3cm) Pt, has difficulty securely holding cooking utensils in her right hand Goal status: INITIAL  3.  Pt. Will efficiently perform hand to mouth patterns during self-feeding with modified independence using her right hand. Baseline: Eval: Pt. Has difficulty completing hand to mouth patterns, often bringing her mouth down to meet the spoon 2/2 weakness in the RUE/hand Goal status: INITIAL  4.  Pt. Will cut meat on her plate efficiently with modified independence Baseline: Eval: Pt. Has difficulty cutting meat Goal status: INITIAL  5.  Pt. Will increase bilateral shoulder abduction by 10 degrees be able to efficiently perform hair care Baseline: Eval: shoulder abduction: Right: 82(120), Left: 72(102) Goal status: INITIAL  6.  Pt. Will write one sentence efficiently  with 75% in preparation for being able to fill in crossword puzzles. Baseline: Eval: writing legibility: 75% for name only. Goal status: INITIAL  ASSESSMENT:  CLINICAL IMPRESSION:  Pt. reports that she is working with her hand at home, and has noticed an improvement. Pt. reports that she is using only heat at home. Pt. continues to tolerate contrasting heat/cold today for pain and stiffness, as well as massage and ROM  to the digits of the right hand. Pt. reports that she has noticed the right hand is improving. Pt. requires cues and assist to maintain the right wrist in neutral during intrinsic stretches. Pt. Was able to perform digit extension from the tabletop surface with the 2nd and 5th digits, and is starting to initiate 3rd digit extension from the tabletop with facilitation. Pt. To lift the 4th digit in extension from the tabletop surface. Stabilization was required to the digits on each side of the ones extending. Pt. education was provided about the progression of the HEP, and exercises were  reviewed with the Pt. For both the right,  and left hands. Pt. continues benefit from OT services to work on improving right hand pain, and to improve overall  BUE, functioning in order to increase engagement of the UEs during ADLs, and IADL tasks.   PERFORMANCE DEFICITS: in functional skills including ADLs, IADLs, coordination, dexterity, ROM, strength, pain, Fine motor control, Gross motor control, and UE functional use, , and psychosocial skills including coping strategies, environmental adaptation, habits, interpersonal interactions, and routines and behaviors.   IMPAIRMENTS: are limiting patient from ADLs, IADLs, and leisure.   CO-MORBIDITIES: may have co-morbidities  that affects occupational performance. Patient will benefit from skilled OT to address above impairments and improve overall function.  MODIFICATION OR ASSISTANCE TO COMPLETE EVALUATION: Min-Moderate modification of tasks or assist with assess necessary to complete an evaluation.  OT OCCUPATIONAL PROFILE AND HISTORY: Detailed assessment: Review of records and additional review of physical, cognitive, psychosocial history related to current functional performance.  CLINICAL DECISION MAKING: Moderate - several treatment options, min-mod task modification necessary  REHAB POTENTIAL: Good  EVALUATION COMPLEXITY: Moderate    PLAN:  OT FREQUENCY: 2x/week  OT DURATION: 12 weeks  PLANNED INTERVENTIONS: 97168 OT Re-evaluation, 97535 self care/ADL training, 02889 therapeutic exercise, 97530 therapeutic activity, 97112 neuromuscular re-education, 97140 manual therapy, 97018 paraffin, 02989 moist heat, 97010 cryotherapy, 97034 contrast bath, 97760 Orthotic Initial, 97763 Orthotic/Prosthetic subsequent, passive range of motion, balance training, functional mobility training, energy conservation, coping strategies training, patient/family education, and DME and/or AE instructions  RECOMMENDED OTHER SERVICES: PT for balance  CONSULTED AND AGREED WITH PLAN OF CARE:  Patient  PLAN FOR NEXT SESSION:  Assess sensation, and hand function skills at the next visit, as well administer the MAM-20 outcome measure; treatment  Richardson Otter, MS, OTR/L  06/01/2024, 5:35 PM

## 2024-06-05 ENCOUNTER — Ambulatory Visit: Admitting: Occupational Therapy

## 2024-06-05 DIAGNOSIS — R278 Other lack of coordination: Secondary | ICD-10-CM

## 2024-06-05 DIAGNOSIS — M6281 Muscle weakness (generalized): Secondary | ICD-10-CM | POA: Diagnosis not present

## 2024-06-05 NOTE — Therapy (Signed)
 OUTPATIENT OCCUPATIONAL THERAPY TREATMENT NOTE  Patient Name: Maria Lowe MRN: [id number] DOB:[birth year], 89 y.o., female Today's Date: 06/05/2024   REFERRING PROVIDER: Katrinka Senior, MD  END OF SESSION:   OT End of Session - 06/05/24 1205     Visit Number 4    Number of Visits 24    Date for OT Re-Evaluation 08/17/24    OT Start Time 1015    OT Stop Time 1105    OT Time Calculation (min) 50 min    Activity Tolerance Patient tolerated treatment well    Behavior During Therapy WFL for tasks assessed/performed          Past Medical History:  Diagnosis Date   Anticoagulant long-term use    eliquis -- managed by cardiology   Arthritis    knees   CKD (chronic kidney disease), stage III (HCC)    CLL (chronic lymphocytic leukemia) (HCC) 11/27/2013   oncologist-- dr lonn;  initial dx 03/ 2015, no treatment, active survillance   Essential hypertension    followed by pcp   Full dentures    History of gastric ulcer    stomach ulcer when young   Lymphocytosis 2009   followed oncology   Permanent atrial fibrillation (HCC) 11/2020   cardiologist-- primary dr w. barbaraann and followed by atrial clinic;    dx 003/ 2022, atenolol  for rate control and on eliquis    Rectocele    Thrombocythemia 2009   related to CLL, followed by dr lonn   Urinary incontinence    Vaginal atrophy    Past Surgical History:  Procedure Laterality Date   APPENDECTOMY     1980s   CATARACT EXTRACTION W/ INTRAOCULAR LENS IMPLANT Bilateral 2020   RECTOCELE REPAIR N/A 08/25/2021   Procedure: POSTERIOR REPAIR (RECTOCELE);  Surgeon: Jertson, Jill Evelyn, MD;  Location: [facility];  Service: Gynecology;  Laterality: N/A;   TONSILLECTOMY AND ADENOIDECTOMY     age 48   Patient Active Problem List   Diagnosis Date Noted   Severe mitral regurgitation 05/23/2024   Deficiency anemia 04/21/2024   Dehydration 04/21/2024   Acute left-sided low back pain with left-sided sciatica 06/25/2022    H/O rectocele repair 08/25/2021   Secondary hypercoagulable state (HCC) 12/10/2020   Persistent atrial fibrillation (HCC) 11/29/2020   Hyperlipidemia, unspecified 04/07/2018   CKD (chronic kidney disease), stage III (HCC) 02/09/2018   Rash 02/09/2018   Seborrheic dermatitis 01/08/2017   Former smoker 09/30/2015   Rectocele 03/29/2015   Thrombocytopenia (HCC) 11/26/2014   CLL (chronic lymphocytic leukemia) (HCC) 11/27/2013   Hyperglycemia 05/08/2013   Essential hypertension, benign 11/07/2012   Obesity, unspecified 11/07/2012    ONSET DATE: 01/2024  REFERRING DIAG: Right hand weakness  THERAPY DIAG:  Muscle weakness (generalized)  Other lack of coordination  Rationale for Evaluation and Treatment: Rehabilitation  SUBJECTIVE:   SUBJECTIVE STATEMENT: Pt. Reports being disappointed the she is having difficulty using a spoon to feed herself. Pt accompanied by: self; with daughter in the reception area  PERTINENT HISTORY:  Pt. was referred for outpatient OT services for right hand pain. Pt. had received a cat bite approximately one week prior to her going on a trip to [place]. She was hospitalized in [place] for one month due to developing a Cat Bite Pasturella Infection. While in the hospital, Pt. reports that she was placed in an arm cast. Pt. reports that while she was in the hospital she was bedbound, was not allowed to get out of bed for the entire  month, and became weak, and deconditioned. Pt. Reports that she does not know why she was placed in a cast. [place] return to the [place] Pt. started home health OT and PT services, however OT services were limited due to staffing constraints. Pt. was then referred to outpatient OT services. PMHx includes: Severe mitral valve regurgitation, pericadrial, and pleural effusions, A-Fib, Chronic Lymphocytic Anemia, multifactorial anemia and CKD stage III, Diverticulosis, constipation, HTN, Hyperlipidemia, Prediabetes.  PRECAUTIONS: None  WEIGHT  BEARING RESTRICTIONS: No  PAIN:  Are you having pain? 0-5/10 intermittent right hand pain  FALLS: Has patient fallen in last 6 months? 1 fall in the last 6 months  LIVING ENVIRONMENT: Lives with: Resides with her daughter Lives in: House- one level Stairs: ramped entrance Has following equipment at home: Counselling psychologist, Environmental consultant - 4 wheeled, shower chair, bed side commode, and Grab bars, hospital bed, elevated toilet  PLOF: Independent, living alone, managing a household-per report  PATIENT GOALS:  To be able to cut her meat  OBJECTIVE:  Note: Objective measures were completed at Evaluation unless otherwise noted.  HAND DOMINANCE: Right  ADLs:  Transfers/ambulation related to ADLs: Supervision with a rollator walker Eating: Pt. Has difficulty with using utensils with the right hand, often bringing her head down to meet hand/utensil when eating. Pt. Has  weakness with bringing the cup to her mouth. Difficulty with cutting food. Grooming: Difficulty sustaining the BUE's in up long enough to perform hair care. Independent with oral care,  UB Dressing: Independent, requires increased time to complete. Pt. Does endorse having difficulty reaching up to put a shirt on over her head. LB Dressing:Reports being able to perform LE dressing, Uses slide-on footwear.  Toileting:Pt. Reports independently being able to perform toileting care needs using her left hand. (Of note, Pt. Reports that she has always used her left hand to perform toilet hygiene care.) Bathing: Independent, daughter provides supervision from outside the shower curtain. Daughter assists with drying her back due to limited reach Tub Shower transfers: Daughter provides supervision tub/shower t/f's   IADLs: Light housekeeping: Pt. reports that she has resumed some light housekeeping tasks, however this has been limited. Pt. Reports that she does not vacuum, or sweep due to rollator use. Meal Prep: Pt. Has resumed weekly  cooking on Sundays. Reports having difficulty cutting onions, as well as difficulty holding and using a beater. Pt. Reports that she uses a modified technique to hold kitchen/cooking utensils. Community mobility: Pt. Relies on family  and friends for transportation Medication management: Pt. Has difficulty manipulating medication efficiently with the right hand Handwriting: 50% legible for name only Leisure/Hobbies: Loves to cook; enjoys reading , and working crossword puzzles.  MOBILITY STATUS:  Supervision using a rollator  FUNCTIONAL OUTCOME MEASURES: 05/30/24: MAM-20: 11/20 items were completed. The following items Pt. indicated as a 2 -very hard to do: wringing out a towel, opening a wide mouth bottle-previously opened, cutting meat on a plate, tying shoe laces, zipping a jacket, and squeezing toothpaste on a toothbrush. The following items Pt. indicated as a 1-cannot do: opening a medication bottle with a childproof cap, cutting nails with a clipper, buttoning clothing, picking up a 1/2 full water pitcher, and writing 3-4 lines legibly.  UPPER EXTREMITY ROM:     ROM Right eval Left eval  Shoulder flexion 130(150) Sc[place]ion   Shoulder abduction 82(120) 72(102)  Shoulder adduction    Shoulder extension    Shoulder internal rotation    Shoulder external rotation  Elbow flexion WFL 148  Elbow extension WFL -60(-28)  Wrist flexion 26(60) WFL  Wrist extension 44(62) WFL  Wrist ulnar deviation    Wrist radial deviation    Wrist pronation    Wrist supination    (Blank rows = not tested)  Eval: Digit flexion to the [facility]:    Right: 2nd: 5cm(2cm) 3rd: 0cm  4th: 0cm 5th: 5cm(3cm)     Left: 2nd: 1cm(1cm) 3rd: 2cm(0cm) 4th: 4cm(0cm) 5th: 5cm(3cm)  UPPER EXTREMITY MMT:     MMT Right eval Left eval  Shoulder flexion    Shoulder abduction 3-/5 3-/5  Shoulder adduction    Shoulder extension    Shoulder internal rotation    Shoulder external rotation    Middle trapezius    Lower  trapezius    Elbow flexion 4/5 4/5  Elbow extension 4/5 2+/5  Wrist flexion 3-/5 4-/5  Wrist extension 3-/5 4-/5  Wrist ulnar deviation    Wrist radial deviation    Wrist pronation    Wrist supination    (Blank rows = not tested)  HAND FUNCTION: 05/30/24: Grip strength: Right: 8 lbs; Left: 12 lbs, Lateral pinch: Right: 4 lbs, Left: 3 lbs, and 3 point pinch: Right: 3 lbs, Left: 3 lbs  COORDINATION: 9 Hole Peg test: Right: 48 sec; Left: 49 sec  SENSATION: TBD   EDEMA: N/A    COGNITION: Overall cognitive status: Within functional limits for tasks assessed  VISION: Subjective report: No change from baseline  PERCEPTION: WFL                                                                                                                TREATMENT DATE: 06/05/24  Contrast Bath:  Pt. tolerated contrasting heat pack for 3 min. followed by cold pack for 1 min. for 3 trials ending with 3 min. of heat for a total of 15 min. to the Right hand 2/2 pain, and stiffness. Contrast bath was performed in preparation for manual therapy, and there. Ex.  Manual Therapy:   Pt. tolerated STM to the volar surface of the right hand, palm, thenar eminence, hypothenar eminence, the volar aspect of the digits, the thumb MCP/IP joints, and the 1st dorsal interossei to increase circulation, and decrease pain. -Manual therapy was performed independent of, and in preparation for therapeutic Ex.  Therapeutic Activities:   -Facilitated bilateral shoulder stabilization exercises in supine with the shoulder stabilized at 90 degrees with the elbows extended. Pt. worked on stabilizing the UE with perturbations, performing scapular protraction against gravity, formulating circular motion, and formulating the alphabet  with cues, assist, and rest breaks required.   Therapeutic Ex:  -AROM  for the right 2nd through 5th digit MCP, PIP, and DIP flexion followed by passive ROM 2nd, and 5th digit flexion to the end  range in preparation for formulating a composite fist. -Bilateral shoulder flexion exercises were performed using a 1 lb. dowel initially seated for shoulder flexion 1 set 10 reps with assist for support. The dowel exercises were modified, and  transitioned to supine for shoulder flexion, and chest presses with support to the dowel. -Right hand gross gripping for 1 set 10 reps with 1 red band of resistance. -Lateral, and 3pt. Pinch strengthening using yellow, and red level resistive clips, and placing them onto a horizontal dowel.    PATIENT EDUCATION: Education details:  contrast heat/cold;  massage to the right hand, HEP progression Person educated: Patient Education method: Explanation, Demonstration, Tactile cues, and Verbal cues Education comprehension: verbalized understanding, returned demonstration, verbal cues required, tactile cues required, and needs further education  HOME EXERCISE PROGRAM:  -contrasting heat 3 min., followed by 1 min. Cold  for 1 min. Ending with heat for 3 min. To the right hand. (Pt. Prefers just heat at home), moist heat to the left hand. -Massage to the right hand  -Right hand digit extension with the hand flat at the tabletop surface -Blue resistive foam block for Bilateral gross gripping, lateral pinch, and 3pt. Pinch strength    GOALS: Goals reviewed with patient? Yes  SHORT TERM GOALS: Target date: 07/07/2024     Pt. Will be independent with HEPs for UE strength, and coordination Baseline: Eval: No current HEP Goal status: INITIAL   LONG TERM GOALS: Target date: 08/18/2024   To reduce pain by 3 grades on the pain scale in preparation for functional hand use during ADLs/IADLs Baseline: Eval:  5/10 pain in the right hand Goal status: INITIAL  2.  Pt. Will formulate a full composite fist in preparation for securely holding kitchen/cooking utensils. Baseline: Digit flexion to the IER:Mphyu: 2nd: 5cm(2cm) 3rd: 0cm  4th: 0cm 5th: 5cm(3cm) Left:  2nd: 1cm(1cm) 3rd: 2cm(0cm) 4th: 4cm(0cm) 5th: 5cm(3cm) Pt, has difficulty securely holding cooking utensils in her right hand Goal status: INITIAL  3.  Pt. Will efficiently perform hand to mouth patterns during self-feeding with modified independence using her right hand. Baseline: Eval: Pt. Has difficulty completing hand to mouth patterns, often bringing her mouth down to meet the spoon 2/2 weakness in the RUE/hand Goal status: INITIAL  4.  Pt. Will cut meat on her plate efficiently with modified independence Baseline: Eval: Pt. Has difficulty cutting meat Goal status: INITIAL  5.  Pt. Will increase bilateral shoulder abduction by 10 degrees be able to efficiently perform hair care Baseline: Eval: shoulder abduction: Right: 82(120), Left: 72(102) Goal status: INITIAL  6.  Pt. Will write one sentence efficiently  with 75% in preparation for being able to fill in crossword puzzles. Baseline: Eval: writing legibility: 75% for name only. Goal status: INITIAL  ASSESSMENT:  CLINICAL IMPRESSION:  Pt. reports that she is still having difficulty using a spoon to feed herself soup. When assessed simulated self-feeding, Pt. was able to hold  the spoon stable in her right hand while scooping the contents from a bowl, however has weakness proximally in the RUE affecting completion of the hand to mouth pattern with the spoon efficiently. Pt. tolerated the session well today, however presents with BUE weakness requiring varying levels of proximal support, and assist to complete each of  the exercises.  Pt. Was able to formulate the alphabet to the letter B with the RUE in supine, and the letter D with the LUE. Pt. Presents with BUE weakness affecting efficiency of self-feeding skills. Pt. continues benefit from OT services to work on improving right hand pain, and to improve overall  BUE, functioning in order to increase engagement of the UEs during ADLs, and IADL tasks.   PERFORMANCE DEFICITS: in  functional skills  including ADLs, IADLs, coordination, dexterity, ROM, strength, pain, Fine motor control, Gross motor control, and UE functional use, , and psychosocial skills including coping strategies, environmental adaptation, habits, interpersonal interactions, and routines and behaviors.   IMPAIRMENTS: are limiting patient from ADLs, IADLs, and leisure.   CO-MORBIDITIES: may have co-morbidities  that affects occupational performance. Patient will benefit from skilled OT to address above impairments and improve overall function.  MODIFICATION OR ASSISTANCE TO COMPLETE EVALUATION: Min-Moderate modification of tasks or assist with assess necessary to complete an evaluation.  OT OCCUPATIONAL PROFILE AND HISTORY: Detailed assessment: Review of records and additional review of physical, cognitive, psychosocial history related to current functional performance.  CLINICAL DECISION MAKING: Moderate - several treatment options, min-mod task modification necessary  REHAB POTENTIAL: Good  EVALUATION COMPLEXITY: Moderate    PLAN:  OT FREQUENCY: 2x/week  OT DURATION: 12 weeks  PLANNED INTERVENTIONS: 97168 OT Re-evaluation, 97535 self care/ADL training, 02889 therapeutic exercise, 97530 therapeutic activity, 97112 neuromuscular re-education, 97140 manual therapy, 97018 paraffin, 02989 moist heat, 97010 cryotherapy, 97034 contrast bath, 97760 Orthotic Initial, 97763 Orthotic/Prosthetic subsequent, passive range of motion, balance training, functional mobility training, energy conservation, coping strategies training, patient/family education, and DME and/or AE instructions  RECOMMENDED OTHER SERVICES: PT for balance  CONSULTED AND AGREED WITH PLAN OF CARE: Patient  PLAN FOR NEXT SESSION:  Assess sensation, and hand function skills at the next visit, as well administer the MAM-20 outcome measure; treatment  Richardson Otter, MS, OTR/L  06/05/2024, 12:09 PM

## 2024-06-07 ENCOUNTER — Ambulatory Visit: Admitting: Occupational Therapy

## 2024-06-07 DIAGNOSIS — M6281 Muscle weakness (generalized): Secondary | ICD-10-CM | POA: Diagnosis not present

## 2024-06-08 NOTE — Therapy (Signed)
 OUTPATIENT OCCUPATIONAL THERAPY TREATMENT NOTE  Patient Name: Maria Lowe MRN: [id number] DOB:[birth year], 89 y.o., female Today's Date: 06/08/2024   REFERRING PROVIDER: Katrinka Senior, MD  END OF SESSION:   OT End of Session - 06/08/24 1658     Visit Number 5    Date for Recertification  08/17/24    OT Start Time 1015    OT Stop Time 1100    OT Time Calculation (min) 45 min    Activity Tolerance Patient tolerated treatment well    Behavior During Therapy WFL for tasks assessed/performed          Past Medical History:  Diagnosis Date   Anticoagulant long-term use    eliquis -- managed by cardiology   Arthritis    knees   CKD (chronic kidney disease), stage III (HCC)    CLL (chronic lymphocytic leukemia) (HCC) 11/27/2013   oncologist-- dr lonn;  initial dx 03/ 2015, no treatment, active survillance   Essential hypertension    followed by pcp   Full dentures    History of gastric ulcer    stomach ulcer when young   Lymphocytosis 2009   followed oncology   Permanent atrial fibrillation (HCC) 11/2020   cardiologist-- primary dr w. barbaraann and followed by atrial clinic;    dx 003/ 2022, atenolol  for rate control and on eliquis    Rectocele    Thrombocythemia 2009   related to CLL, followed by dr lonn   Urinary incontinence    Vaginal atrophy    Past Surgical History:  Procedure Laterality Date   APPENDECTOMY     1980s   CATARACT EXTRACTION W/ INTRAOCULAR LENS IMPLANT Bilateral 2020   RECTOCELE REPAIR N/A 08/25/2021   Procedure: POSTERIOR REPAIR (RECTOCELE);  Surgeon: Jertson, Jill Evelyn, MD;  Location: [facility];  Service: Gynecology;  Laterality: N/A;   TONSILLECTOMY AND ADENOIDECTOMY     age 31   Patient Active Problem List   Diagnosis Date Noted   Severe mitral regurgitation 05/23/2024   Deficiency anemia 04/21/2024   Dehydration 04/21/2024   Acute left-sided low back pain with left-sided sciatica 06/25/2022   H/O rectocele repair  08/25/2021   Secondary hypercoagulable state (HCC) 12/10/2020   Persistent atrial fibrillation (HCC) 11/29/2020   Hyperlipidemia, unspecified 04/07/2018   CKD (chronic kidney disease), stage III (HCC) 02/09/2018   Rash 02/09/2018   Seborrheic dermatitis 01/08/2017   Former smoker 09/30/2015   Rectocele 03/29/2015   Thrombocytopenia (HCC) 11/26/2014   CLL (chronic lymphocytic leukemia) (HCC) 11/27/2013   Hyperglycemia 05/08/2013   Essential hypertension, benign 11/07/2012   Obesity, unspecified 11/07/2012    ONSET DATE: 01/2024  REFERRING DIAG: Right hand weakness  THERAPY DIAG:  Muscle weakness (generalized)  Rationale for Evaluation and Treatment: Rehabilitation  SUBJECTIVE:   SUBJECTIVE STATEMENT: Pt. Reports being disappointed the she is having difficulty using a spoon to feed herself. Pt accompanied by: self; with daughter in the reception area  PERTINENT HISTORY:  Pt. was referred for outpatient OT services for right hand pain. Pt. had received a cat bite approximately one week prior to her going on a trip to [place]. She was hospitalized in [place] for one month due to developing a Cat Bite Pasturella Infection. While in the hospital, Pt. reports that she was placed in an arm cast. Pt. reports that while she was in the hospital she was bedbound, was not allowed to get out of bed for the entire month, and became weak, and deconditioned. Pt. Reports that she does not  know why she was placed in a cast. [place] return to the [place] Pt. started home health OT and PT services, however OT services were limited due to staffing constraints. Pt. was then referred to outpatient OT services. PMHx includes: Severe mitral valve regurgitation, pericadrial, and pleural effusions, A-Fib, Chronic Lymphocytic Anemia, multifactorial anemia and CKD stage III, Diverticulosis, constipation, HTN, Hyperlipidemia, Prediabetes.  PRECAUTIONS: None  WEIGHT BEARING RESTRICTIONS: No  PAIN:  Are you having  pain? 0-5/10 intermittent right hand pain  FALLS: Has patient fallen in last 6 months? 1 fall in the last 6 months  LIVING ENVIRONMENT: Lives with: Resides with her daughter Lives in: House- one level Stairs: ramped entrance Has following equipment at home: Counselling psychologist, Environmental consultant - 4 wheeled, shower chair, bed side commode, and Grab bars, hospital bed, elevated toilet  PLOF: Independent, living alone, managing a household-per report  PATIENT GOALS:  To be able to cut her meat  OBJECTIVE:  Note: Objective measures were completed at Evaluation unless otherwise noted.  HAND DOMINANCE: Right  ADLs:  Transfers/ambulation related to ADLs: Supervision with a rollator walker Eating: Pt. Has difficulty with using utensils with the right hand, often bringing her head down to meet hand/utensil when eating. Pt. Has  weakness with bringing the cup to her mouth. Difficulty with cutting food. Grooming: Difficulty sustaining the BUE's in up long enough to perform hair care. Independent with oral care,  UB Dressing: Independent, requires increased time to complete. Pt. Does endorse having difficulty reaching up to put a shirt on over her head. LB Dressing:Reports being able to perform LE dressing, Uses slide-on footwear.  Toileting:Pt. Reports independently being able to perform toileting care needs using her left hand. (Of note, Pt. Reports that she has always used her left hand to perform toilet hygiene care.) Bathing: Independent, daughter provides supervision from outside the shower curtain. Daughter assists with drying her back due to limited reach Tub Shower transfers: Daughter provides supervision tub/shower t/f's   IADLs: Light housekeeping: Pt. reports that she has resumed some light housekeeping tasks, however this has been limited. Pt. Reports that she does not vacuum, or sweep due to rollator use. Meal Prep: Pt. Has resumed weekly cooking on Sundays. Reports having difficulty  cutting onions, as well as difficulty holding and using a beater. Pt. Reports that she uses a modified technique to hold kitchen/cooking utensils. Community mobility: Pt. Relies on family  and friends for transportation Medication management: Pt. Has difficulty manipulating medication efficiently with the right hand Handwriting: 50% legible for name only Leisure/Hobbies: Loves to cook; enjoys reading , and working crossword puzzles.  MOBILITY STATUS:  Supervision using a rollator  FUNCTIONAL OUTCOME MEASURES: 05/30/24: MAM-20: 11/20 items were completed. The following items Pt. indicated as a 2 -very hard to do: wringing out a towel, opening a wide mouth bottle-previously opened, cutting meat on a plate, tying shoe laces, zipping a jacket, and squeezing toothpaste on a toothbrush. The following items Pt. indicated as a 1-cannot do: opening a medication bottle with a childproof cap, cutting nails with a clipper, buttoning clothing, picking up a 1/2 full water pitcher, and writing 3-4 lines legibly.  UPPER EXTREMITY ROM:     ROM Right eval Left eval  Shoulder flexion 130(150) [place]   Shoulder abduction 82(120) 72(102)  Shoulder adduction    Shoulder extension    Shoulder internal rotation    Shoulder external rotation    Elbow flexion WFL 148  Elbow extension [facility] -60(-28)  Wrist flexion  26(60) WFL  Wrist extension 44(62) WFL  Wrist ulnar deviation    Wrist radial deviation    Wrist pronation    Wrist supination    (Blank rows = not tested)  Eval: Digit flexion to the [facility]:    Right: 2nd: 5cm(2cm) 3rd: 0cm  4th: 0cm 5th: 5cm(3cm)     Left: 2nd: 1cm(1cm) 3rd: 2cm(0cm) 4th: 4cm(0cm) 5th: 5cm(3cm)  UPPER EXTREMITY MMT:     MMT Right eval Left eval  Shoulder flexion    Shoulder abduction 3-/5 3-/5  Shoulder adduction    Shoulder extension    Shoulder internal rotation    Shoulder external rotation    Middle trapezius    Lower trapezius    Elbow flexion 4/5 4/5  Elbow  extension 4/5 2+/5  Wrist flexion 3-/5 4-/5  Wrist extension 3-/5 4-/5  Wrist ulnar deviation    Wrist radial deviation    Wrist pronation    Wrist supination    (Blank rows = not tested)  HAND FUNCTION: 05/30/24: Grip strength: Right: 8 lbs; Left: 12 lbs, Lateral pinch: Right: 4 lbs, Left: 3 lbs, and 3 point pinch: Right: 3 lbs, Left: 3 lbs  COORDINATION: 9 Hole Peg test: Right: 48 sec; Left: 49 sec  SENSATION: TBD   EDEMA: N/A    COGNITION: Overall cognitive status: Within functional limits for tasks assessed  VISION: Subjective report: No change from baseline  PERCEPTION: WFL                                                                                                                TREATMENT DATE: 06/08/24  Contrast Bath:   Pt. tolerated contrasting heat pack for 3 min. followed by cold pack for 1 min. for 3 trials ending with 3 min. of heat for a total of 15 min. to the Right hand 2/2 pain, and stiffness. Contrast bath was performed in preparation for manual therapy, and there. Ex.   Manual Therapy:    Pt. tolerated STM to the volar surface of the right hand, palm, thenar eminence, hypothenar eminence, the volar aspect of the digits, the thumb MCP/IP joints, and the 1st dorsal interossei to increase circulation, and decrease pain. -gentle carpal spread stretches were performed  -gentle metacarpal spread stretches were performed -Manual therapy was performed independent of, and in preparation for therapeutic Ex.  Therapeutic Ex:   Right:   -AROM  for the right 2nd through 5th digit MCP, PIP, and DIP flexion followed by passive 2nd, and 5th digit flexion to the end range in preparation for formulating a composite fist. - Reviewed active intrinsic stretches- with assist required, and cues to maintain the wrist in neutral and avoiding wrist extension during the stretches.   Left: -Passive stretching for Left 5th digit MP, PIP, and DIP flexion in preparation for  formulating a composite fist following moist heat   PATIENT EDUCATION: Education details:  contrast heat/cold;  massage to the right hand Person educated: Patient Education method: Explanation, Demonstration, Tactile cues, and Verbal cues Education  comprehension: verbalized understanding, returned demonstration, verbal cues required, tactile cues required, and needs further education  HOME EXERCISE PROGRAM:  -contrasting heat 3 min., followed by 1 min. Cold  for 1 min. Ending with heat for 3 min. To the right hand. (Pt. Prefers just heat at home), moist heat to the left hand. -Massage to the right hand  -Right hand digit extension with the hand flat at the tabletop surface -Blue resistive foam block for Bilateral gross gripping, lateral pinch, and 3pt. Pinch strength -UE shoulder stabilization in supine with the shoulder flexed to 90 degrees with the elbow extended-formulating the alphabet      GOALS: Goals reviewed with patient? Yes  SHORT TERM GOALS: Target date: 07/07/2024     Pt. Will be independent with HEPs for UE strength, and coordination Baseline: Eval: No current HEP Goal status: INITIAL   LONG TERM GOALS: Target date: 08/18/2024   To reduce pain by 3 grades on the pain scale in preparation for functional hand use during ADLs/IADLs Baseline: Eval:  5/10 pain in the right hand Goal status: INITIAL  2.  Pt. Will formulate a full composite fist in preparation for securely holding kitchen/cooking utensils. Baseline: Digit flexion to the IER:Mphyu: 2nd: 5cm(2cm) 3rd: 0cm  4th: 0cm 5th: 5cm(3cm) Left: 2nd: 1cm(1cm) 3rd: 2cm(0cm) 4th: 4cm(0cm) 5th: 5cm(3cm) Pt, has difficulty securely holding cooking utensils in her right hand Goal status: INITIAL  3.  Pt. Will efficiently perform hand to mouth patterns during self-feeding with modified independence using her right hand. Baseline: Eval: Pt. Has difficulty completing hand to mouth patterns, often bringing her mouth down  to meet the spoon 2/2 weakness in the RUE/hand Goal status: INITIAL  4.  Pt. Will cut meat on her plate efficiently with modified independence Baseline: Eval: Pt. Has difficulty cutting meat Goal status: INITIAL  5.  Pt. Will increase bilateral shoulder abduction by 10 degrees be able to efficiently perform hair care Baseline: Eval: shoulder abduction: Right: 82(120), Left: 72(102) Goal status: INITIAL  6.  Pt. Will write one sentence efficiently  with 75% in preparation for being able to fill in crossword puzzles. Baseline: Eval: writing legibility: 75% for name only. Goal status: INITIAL  ASSESSMENT:  CLINICAL IMPRESSION:  Pt. reports that she has been doing exercises at home, and reports that she she can feel like her arms were exercising. Pt. is tolerating contrast bath, manual therapy, and proximal/distal UE exercises well. BUE, and hand weakness continues to affect efficiency with self-feeding skills, cutting food, performing hair care, writing legibly, and securely holding items. Pt. continues benefit from OT services to work on improving right hand pain, and to improve overall  BUE, functioning in order to increase engagement of the UEs during ADLs, and IADL tasks.   PERFORMANCE DEFICITS: in functional skills including ADLs, IADLs, coordination, dexterity, ROM, strength, pain, Fine motor control, Gross motor control, and UE functional use, , and psychosocial skills including coping strategies, environmental adaptation, habits, interpersonal interactions, and routines and behaviors.   IMPAIRMENTS: are limiting patient from ADLs, IADLs, and leisure.   CO-MORBIDITIES: may have co-morbidities  that affects occupational performance. Patient will benefit from skilled OT to address above impairments and improve overall function.  MODIFICATION OR ASSISTANCE TO COMPLETE EVALUATION: Min-Moderate modification of tasks or assist with assess necessary to complete an evaluation.  OT  OCCUPATIONAL PROFILE AND HISTORY: Detailed assessment: Review of records and additional review of physical, cognitive, psychosocial history related to current functional performance.  CLINICAL DECISION MAKING: Moderate - several  treatment options, min-mod task modification necessary  REHAB POTENTIAL: Good  EVALUATION COMPLEXITY: Moderate    PLAN:  OT FREQUENCY: 2x/week  OT DURATION: 12 weeks  PLANNED INTERVENTIONS: 97168 OT Re-evaluation, 97535 self care/ADL training, 97110 therapeutic exercise, 97530 therapeutic activity, 97112 neuromuscular re-education, 97140 manual therapy, 97018 paraffin, 97010 moist heat, 97010 cryotherapy, 97034 contrast bath, 97760 Orthotic Initial, 97763 Orthotic/Prosthetic subsequent, passive range of motion, balance training, functional mobility training, energy conservation, coping strategies training, patient/family education, and DME and/or AE instructions  RECOMMENDED OTHER SERVICES: PT for balance  CONSULTED AND AGREED WITH PLAN OF CARE: Patient  PLAN FOR NEXT SESSION:  Assess sensation, and hand function skills at the next visit, as well administer the MAM-20 outcome measure; treatment  Richardson Otter, MS, OTR/L  06/08/2024, 5:00 PM

## 2024-06-12 ENCOUNTER — Ambulatory Visit: Admitting: Occupational Therapy

## 2024-06-12 DIAGNOSIS — M6281 Muscle weakness (generalized): Secondary | ICD-10-CM

## 2024-06-12 NOTE — Therapy (Addendum)
 OUTPATIENT OCCUPATIONAL THERAPY TREATMENT NOTE  Patient Name: Maria Lowe MRN: [id number] DOB:11-19-1930, 89 y.o., female Today's Date: 06/12/2024   REFERRING PROVIDER: Katrinka Senior, MD  END OF SESSION:   OT End of Session - 06/12/24 1115     Visit Number 6    Number of Visits 24    Date for Recertification  08/17/24    OT Start Time 1015    OT Stop Time 1100    OT Time Calculation (min) 45 min    Activity Tolerance Patient tolerated treatment well    Behavior During Therapy WFL for tasks assessed/performed          Past Medical History:  Diagnosis Date   Anticoagulant long-term use    eliquis -- managed by cardiology   Arthritis    knees   CKD (chronic kidney disease), stage III (HCC)    CLL (chronic lymphocytic leukemia) (HCC) 11/27/2013   oncologist-- dr lonn;  initial dx 03/ 2015, no treatment, active survillance   Essential hypertension    followed by pcp   Full dentures    History of gastric ulcer    stomach ulcer when young   Lymphocytosis 2009   followed oncology   Permanent atrial fibrillation (HCC) 11/2020   cardiologist-- primary dr w. barbaraann and followed by atrial clinic;    dx 003/ 2022, atenolol  for rate control and on eliquis    Rectocele    Thrombocythemia 2009   related to CLL, followed by dr lonn   Urinary incontinence    Vaginal atrophy    Past Surgical History:  Procedure Laterality Date   APPENDECTOMY     1980s   CATARACT EXTRACTION W/ INTRAOCULAR LENS IMPLANT Bilateral 2020   RECTOCELE REPAIR N/A 08/25/2021   Procedure: POSTERIOR REPAIR (RECTOCELE);  Surgeon: Jertson, Jill Evelyn, MD;  Location: [facility];  Service: Gynecology;  Laterality: N/A;   TONSILLECTOMY AND ADENOIDECTOMY     age 57   Patient Active Problem List   Diagnosis Date Noted   Severe mitral regurgitation 05/23/2024   Deficiency anemia 04/21/2024   Dehydration 04/21/2024   Acute left-sided low back pain with left-sided sciatica 06/25/2022    H/O rectocele repair 08/25/2021   Secondary hypercoagulable state (HCC) 12/10/2020   Persistent atrial fibrillation (HCC) 11/29/2020   Hyperlipidemia, unspecified 04/07/2018   CKD (chronic kidney disease), stage III (HCC) 02/09/2018   Rash 02/09/2018   Seborrheic dermatitis 01/08/2017   Former smoker 09/30/2015   Rectocele 03/29/2015   Thrombocytopenia (HCC) 11/26/2014   CLL (chronic lymphocytic leukemia) (HCC) 11/27/2013   Hyperglycemia 05/08/2013   Essential hypertension, benign 11/07/2012   Obesity, unspecified 11/07/2012    ONSET DATE: 01/2024  REFERRING DIAG: Right hand weakness  THERAPY DIAG:  Muscle weakness (generalized)  Rationale for Evaluation and Treatment: Rehabilitation  SUBJECTIVE:   SUBJECTIVE STATEMENT: Pt. Reports that the pain is better today. Pt accompanied by: self; with daughter in the reception area  PERTINENT HISTORY:  Pt. was referred for outpatient OT services for right hand pain. Pt. had received a cat bite approximately one week prior to her going on a trip to [place]. She was hospitalized in [place] for one month due to developing a Cat Bite Pasturella Infection. While in the hospital, Pt. reports that she was placed in an arm cast. Pt. reports that while she was in the hospital she was bedbound, was not allowed to get out of bed for the entire month, and became weak, and deconditioned. Pt. Reports that she does not  know why she was placed in a cast. [place] return to the [place] Pt. started home health OT and PT services, however OT services were limited due to staffing constraints. Pt. was then referred to outpatient OT services. PMHx includes: Severe mitral valve regurgitation, pericadrial, and pleural effusions, A-Fib, Chronic Lymphocytic Anemia, multifactorial anemia and CKD stage III, Diverticulosis, constipation, HTN, Hyperlipidemia, Prediabetes.  PRECAUTIONS: None  WEIGHT BEARING RESTRICTIONS: No  PAIN:  Are you having pain? 0/10 intermittent  right hand pain, 3/10 in the left hand & 5th digit.  FALLS: Has patient fallen in last 6 months? 1 fall in the last 6 months  LIVING ENVIRONMENT: Lives with: Resides with her daughter Lives in: House- one level Stairs: ramped entrance Has following equipment at home: Counselling psychologist, Environmental consultant - 4 wheeled, shower chair, bed side commode, and Grab bars, hospital bed, elevated toilet  PLOF: Independent, living alone, managing a household-per report  PATIENT GOALS:  To be able to cut her meat  OBJECTIVE:  Note: Objective measures were completed at Evaluation unless otherwise noted.  HAND DOMINANCE: Right  ADLs:  Transfers/ambulation related to ADLs: Supervision with a rollator walker Eating: Pt. Has difficulty with using utensils with the right hand, often bringing her head down to meet hand/utensil when eating. Pt. Has  weakness with bringing the cup to her mouth. Difficulty with cutting food. Grooming: Difficulty sustaining the BUE's in up long enough to perform hair care. Independent with oral care,  UB Dressing: Independent, requires increased time to complete. Pt. Does endorse having difficulty reaching up to put a shirt on over her head. LB Dressing:Reports being able to perform LE dressing, Uses slide-on footwear.  Toileting:Pt. Reports independently being able to perform toileting care needs using her left hand. (Of note, Pt. Reports that she has always used her left hand to perform toilet hygiene care.) Bathing: Independent, daughter provides supervision from outside the shower curtain. Daughter assists with drying her back due to limited reach Tub Shower transfers: Daughter provides supervision tub/shower t/f's   IADLs: Light housekeeping: Pt. reports that she has resumed some light housekeeping tasks, however this has been limited. Pt. Reports that she does not vacuum, or sweep due to rollator use. Meal Prep: Pt. Has resumed weekly cooking on Sundays. Reports having  difficulty cutting onions, as well as difficulty holding and using a beater. Pt. Reports that she uses a modified technique to hold kitchen/cooking utensils. Community mobility: Pt. Relies on family  and friends for transportation Medication management: Pt. Has difficulty manipulating medication efficiently with the right hand Handwriting: 50% legible for name only Leisure/Hobbies: Loves to cook; enjoys reading , and working crossword puzzles.  MOBILITY STATUS:  Supervision using a rollator  FUNCTIONAL OUTCOME MEASURES: 05/30/24: MAM-20: 11/20 items were completed. The following items Pt. indicated as a 2 -very hard to do: wringing out a towel, opening a wide mouth bottle-previously opened, cutting meat on a plate, tying shoe laces, zipping a jacket, and squeezing toothpaste on a toothbrush. The following items Pt. indicated as a 1-cannot do: opening a medication bottle with a childproof cap, cutting nails with a clipper, buttoning clothing, picking up a 1/2 full water pitcher, and writing 3-4 lines legibly.  UPPER EXTREMITY ROM:     ROM Right eval Left eval  Shoulder flexion 130(150) Sc[place]ion   Shoulder abduction 82(120) 72(102)  Shoulder adduction    Shoulder extension    Shoulder internal rotation    Shoulder external rotation    Elbow flexion WFL 148  Elbow extension WFL -60(-28)  Wrist flexion 26(60) WFL  Wrist extension 44(62) WFL  Wrist ulnar deviation    Wrist radial deviation    Wrist pronation    Wrist supination    (Blank rows = not tested)  Eval: Digit flexion to the [facility]:    Right: 2nd: 5cm(2cm) 3rd: 0cm  4th: 0cm 5th: 5cm(3cm)     Left: 2nd: 1cm(1cm) 3rd: 2cm(0cm) 4th: 4cm(0cm) 5th: 5cm(3cm)  UPPER EXTREMITY MMT:     MMT Right eval Left eval  Shoulder flexion    Shoulder abduction 3-/5 3-/5  Shoulder adduction    Shoulder extension    Shoulder internal rotation    Shoulder external rotation    Middle trapezius    Lower trapezius    Elbow flexion 4/5 4/5   Elbow extension 4/5 2+/5  Wrist flexion 3-/5 4-/5  Wrist extension 3-/5 4-/5  Wrist ulnar deviation    Wrist radial deviation    Wrist pronation    Wrist supination    (Blank rows = not tested)  HAND FUNCTION: 05/30/24: Grip strength: Right: 8 lbs; Left: 12 lbs, Lateral pinch: Right: 4 lbs, Left: 3 lbs, and 3 point pinch: Right: 3 lbs, Left: 3 lbs  COORDINATION: 9 Hole Peg test: Right: 48 sec; Left: 49 sec  SENSATION: TBD   EDEMA: N/A    COGNITION: Overall cognitive status: Within functional limits for tasks assessed  VISION: Subjective report: No change from baseline  PERCEPTION: WFL                                                                                                                TREATMENT DATE: 06/12/24  Contrast Bath:   Pt. tolerated contrasting heat pack for 3 min. followed by cold pack for 1 min. for 3 trials ending with 3 min. of heat for a total of 15 min. to the Right hand 2/2 pain, and stiffness. Contrast heat/cold was performed in preparation for manual therapy, and there. Ex.   Manual Therapy:    Pt. tolerated STM to the volar surface of the right hand, palm, thenar eminence, hypothenar eminence, the volar aspect of the digits, the thumb MCP/IP joints, and the 1st dorsal interossei, to the volar surface of the left palm, 5th digit to increase circulation, and decrease pain. -gentle carpal spread stretches were performed  -gentle metacarpal spread stretches were performed -Manual therapy was performed independent of, and in preparation for therapeutic Ex.   Therapeutic Ex:   Right:    -AROM  for the right 2nd through 5th digit MCP, PIP, and DIP flexion followed by passive 2nd, and 5th digit flexion to the end range in preparation for formulating a composite fist. -Thumb AROM for abduction - Gross grip strengthening with 5# resistive force 1 set 10 reps each -Right Lateral, and 3pt. Pinch strengthening with red and yellow resistive clips    Left: -Passive stretching for Left 5th digit MP, PIP, and DIP flexion in preparation for formulating a composite fist following moist heat  PATIENT EDUCATION:  Education details:  contrast heat/cold;  massage to the right hand, right hand strengthening Person educated: Patient Education method: Explanation, Demonstration, Tactile cues, and Verbal cues Education comprehension: verbalized understanding, returned demonstration, verbal cues required, tactile cues required, and needs further education  HOME EXERCISE PROGRAM:  -contrasting heat 3 min., followed by 1 min. Cold  for 1 min. Ending with heat for 3 min. To the right hand. (Pt. Prefers just heat at home), moist heat to the left hand. -Massage to the right hand  -Right hand digit extension with the hand flat at the tabletop surface -Blue resistive foam block for Bilateral gross gripping, lateral pinch, and 3pt. Pinch strength -UE shoulder stabilization in supine with the shoulder flexed to 90 degrees with the elbow extended-formulating the alphabet      GOALS: Goals reviewed with patient? Yes  SHORT TERM GOALS: Target date: 07/07/2024     Pt. Will be independent with HEPs for UE strength, and coordination Baseline: Eval: No current HEP Goal status: INITIAL   LONG TERM GOALS: Target date: 08/18/2024   To reduce pain by 3 grades on the pain scale in preparation for functional hand use during ADLs/IADLs Baseline: Eval:  5/10 pain in the right hand Goal status: INITIAL  2.  Pt. Will formulate a full composite fist in preparation for securely holding kitchen/cooking utensils. Baseline: Digit flexion to the IER:Mphyu: 2nd: 5cm(2cm) 3rd: 0cm  4th: 0cm 5th: 5cm(3cm) Left: 2nd: 1cm(1cm) 3rd: 2cm(0cm) 4th: 4cm(0cm) 5th: 5cm(3cm) Pt, has difficulty securely holding cooking utensils in her right hand Goal status: INITIAL  3.  Pt. Will efficiently perform hand to mouth patterns during self-feeding with modified independence using her  right hand. Baseline: Eval: Pt. Has difficulty completing hand to mouth patterns, often bringing her mouth down to meet the spoon 2/2 weakness in the RUE/hand Goal status: INITIAL  4.  Pt. Will cut meat on her plate efficiently with modified independence Baseline: Eval: Pt. Has difficulty cutting meat Goal status: INITIAL  5.  Pt. Will increase bilateral shoulder abduction by 10 degrees be able to efficiently perform hair care Baseline: Eval: shoulder abduction: Right: 82(120), Left: 72(102) Goal status: INITIAL  6.  Pt. Will write one sentence efficiently  with 75% in preparation for being able to fill in crossword puzzles. Baseline: Eval: writing legibility: 75% for name only. Goal status: INITIAL  ASSESSMENT:  CLINICAL IMPRESSION:  Pt. presents with improved pain. Pt. reports no pain in the right hand, and 3/10 pain in the left 5th digit PIP, and DIP flexion. Pt. was able to tolerate progression with right grip, and pinch strengthening. Pt. plans to bring in yellow theraputty from home next visit for progression of right hand strengthening HEP.  Pt. Continues to present with right 2nd & 5th digit MCP/PIP/DIP flexion, and left 5th digit MCP/PIP/DIP flexion.Pt. continues benefit from OT services to work on improving right hand pain, and to improve overall  BUE, functioning in order to increase engagement of the UEs during ADLs, and IADL tasks.   PERFORMANCE DEFICITS: in functional skills including ADLs, IADLs, coordination, dexterity, ROM, strength, pain, Fine motor control, Gross motor control, and UE functional use, , and psychosocial skills including coping strategies, environmental adaptation, habits, interpersonal interactions, and routines and behaviors.   IMPAIRMENTS: are limiting patient from ADLs, IADLs, and leisure.   CO-MORBIDITIES: may have co-morbidities  that affects occupational performance. Patient will benefit from skilled OT to address above impairments and improve  overall function.  MODIFICATION OR ASSISTANCE TO COMPLETE EVALUATION:  Min-Moderate modification of tasks or assist with assess necessary to complete an evaluation.  OT OCCUPATIONAL PROFILE AND HISTORY: Detailed assessment: Review of records and additional review of physical, cognitive, psychosocial history related to current functional performance.  CLINICAL DECISION MAKING: Moderate - several treatment options, min-mod task modification necessary  REHAB POTENTIAL: Good  EVALUATION COMPLEXITY: Moderate    PLAN:  OT FREQUENCY: 2x/week  OT DURATION: 12 weeks  PLANNED INTERVENTIONS: 97168 OT Re-evaluation, 97535 self care/ADL training, 02889 therapeutic exercise, 97530 therapeutic activity, 97112 neuromuscular re-education, 97140 manual therapy, 97018 paraffin, 02989 moist heat, 97010 cryotherapy, 97034 contrast bath, 97760 Orthotic Initial, 97763 Orthotic/Prosthetic subsequent, passive range of motion, balance training, functional mobility training, energy conservation, coping strategies training, patient/family education, and DME and/or AE instructions  RECOMMENDED OTHER SERVICES: PT for balance  CONSULTED AND AGREED WITH PLAN OF CARE: Patient  PLAN FOR NEXT SESSION:  Assess sensation, and hand function skills at the next visit, as well administer the MAM-20 outcome measure; treatment  Richardson Otter, MS, OTR/L  06/12/2024, 11:33 AM

## 2024-06-14 ENCOUNTER — Ambulatory Visit: Admitting: Occupational Therapy

## 2024-06-14 DIAGNOSIS — M6281 Muscle weakness (generalized): Secondary | ICD-10-CM

## 2024-06-14 NOTE — Therapy (Signed)
 OUTPATIENT OCCUPATIONAL THERAPY TREATMENT NOTE  Patient Name: Maria Lowe MRN: [id number] DOB:05-Nov-1930, 89 y.o., female Today's Date: 06/14/2024   REFERRING PROVIDER: Katrinka Senior, MD  END OF SESSION:   OT End of Session - 06/14/24 1209     Visit Number 7    Number of Visits 24    Date for Recertification  08/17/24    OT Start Time 1020    OT Stop Time 1100    OT Time Calculation (min) 40 min    Activity Tolerance Patient tolerated treatment well    Behavior During Therapy WFL for tasks assessed/performed          Past Medical History:  Diagnosis Date   Anticoagulant long-term use    eliquis -- managed by cardiology   Arthritis    knees   CKD (chronic kidney disease), stage III (HCC)    CLL (chronic lymphocytic leukemia) (HCC) 11/27/2013   oncologist-- dr lonn;  initial dx 03/ 2015, no treatment, active survillance   Essential hypertension    followed by pcp   Full dentures    History of gastric ulcer    stomach ulcer when young   Lymphocytosis 2009   followed oncology   Permanent atrial fibrillation (HCC) 11/2020   cardiologist-- primary dr w. barbaraann and followed by atrial clinic;    dx 003/ 2022, atenolol  for rate control and on eliquis    Rectocele    Thrombocythemia 2009   related to CLL, followed by dr lonn   Urinary incontinence    Vaginal atrophy    Past Surgical History:  Procedure Laterality Date   APPENDECTOMY     1980s   CATARACT EXTRACTION W/ INTRAOCULAR LENS IMPLANT Bilateral 2020   RECTOCELE REPAIR N/A 08/25/2021   Procedure: POSTERIOR REPAIR (RECTOCELE);  Surgeon: Jertson, Jill Evelyn, MD;  Location: [facility];  Service: Gynecology;  Laterality: N/A;   TONSILLECTOMY AND ADENOIDECTOMY     age 27   Patient Active Problem List   Diagnosis Date Noted   Severe mitral regurgitation 05/23/2024   Deficiency anemia 04/21/2024   Dehydration 04/21/2024   Acute left-sided low back pain with left-sided sciatica 06/25/2022    H/O rectocele repair 08/25/2021   Secondary hypercoagulable state 12/10/2020   Persistent atrial fibrillation (HCC) 11/29/2020   Hyperlipidemia, unspecified 04/07/2018   CKD (chronic kidney disease), stage III (HCC) 02/09/2018   Rash 02/09/2018   Seborrheic dermatitis 01/08/2017   Former smoker 09/30/2015   Rectocele 03/29/2015   Thrombocytopenia 11/26/2014   CLL (chronic lymphocytic leukemia) (HCC) 11/27/2013   Hyperglycemia 05/08/2013   Essential hypertension, benign 11/07/2012   Obesity, unspecified 11/07/2012    ONSET DATE: 01/2024  REFERRING DIAG: Right hand weakness  THERAPY DIAG:  Muscle weakness (generalized)  Rationale for Evaluation and Treatment: Rehabilitation  SUBJECTIVE:   SUBJECTIVE STATEMENT: Pt. Reports no pain today Pt accompanied by: self; with daughter in the reception area  PERTINENT HISTORY:  Pt. was referred for outpatient OT services for right hand pain. Pt. had received a cat bite approximately one week prior to her going on a trip to [place]. She was hospitalized in [place] for one month due to developing a Cat Bite Pasturella Infection. While in the hospital, Pt. reports that she was placed in an arm cast. Pt. reports that while she was in the hospital she was bedbound, was not allowed to get out of bed for the entire month, and became weak, and deconditioned. Pt. Reports that she does not know why she was placed  in a cast. [place] return to the [place] Pt. started home health OT and PT services, however OT services were limited due to staffing constraints. Pt. was then referred to outpatient OT services. PMHx includes: Severe mitral valve regurgitation, pericadrial, and pleural effusions, A-Fib, Chronic Lymphocytic Anemia, multifactorial anemia and CKD stage III, Diverticulosis, constipation, HTN, Hyperlipidemia, Prediabetes.  PRECAUTIONS: None  WEIGHT BEARING RESTRICTIONS: No  PAIN:  Are you having pain? No reports of pain today  FALLS: Has patient  fallen in last 6 months? 1 fall in the last 6 months  LIVING ENVIRONMENT: Lives with: Resides with her daughter Lives in: House- one level Stairs: ramped entrance Has following equipment at home: Counselling psychologist, Environmental consultant - 4 wheeled, shower chair, bed side commode, and Grab bars, hospital bed, elevated toilet  PLOF: Independent, living alone, managing a household-per report  PATIENT GOALS:  To be able to cut her meat  OBJECTIVE:  Note: Objective measures were completed at Evaluation unless otherwise noted.  HAND DOMINANCE: Right  ADLs:  Transfers/ambulation related to ADLs: Supervision with a rollator walker Eating: Pt. Has difficulty with using utensils with the right hand, often bringing her head down to meet hand/utensil when eating. Pt. Has  weakness with bringing the cup to her mouth. Difficulty with cutting food. Grooming: Difficulty sustaining the BUE's in up long enough to perform hair care. Independent with oral care,  UB Dressing: Independent, requires increased time to complete. Pt. Does endorse having difficulty reaching up to put a shirt on over her head. LB Dressing:Reports being able to perform LE dressing, Uses slide-on footwear.  Toileting:Pt. Reports independently being able to perform toileting care needs using her left hand. (Of note, Pt. Reports that she has always used her left hand to perform toilet hygiene care.) Bathing: Independent, daughter provides supervision from outside the shower curtain. Daughter assists with drying her back due to limited reach Tub Shower transfers: Daughter provides supervision tub/shower t/f's   IADLs: Light housekeeping: Pt. reports that she has resumed some light housekeeping tasks, however this has been limited. Pt. Reports that she does not vacuum, or sweep due to rollator use. Meal Prep: Pt. Has resumed weekly cooking on Sundays. Reports having difficulty cutting onions, as well as difficulty holding and using a beater. Pt.  Reports that she uses a modified technique to hold kitchen/cooking utensils. Community mobility: Pt. Relies on family  and friends for transportation Medication management: Pt. Has difficulty manipulating medication efficiently with the right hand Handwriting: 50% legible for name only Leisure/Hobbies: Loves to cook; enjoys reading , and working crossword puzzles.  MOBILITY STATUS:  Supervision using a rollator  FUNCTIONAL OUTCOME MEASURES: 05/30/24: MAM-20: 11/20 items were completed. The following items Pt. indicated as a 2 -very hard to do: wringing out a towel, opening a wide mouth bottle-previously opened, cutting meat on a plate, tying shoe laces, zipping a jacket, and squeezing toothpaste on a toothbrush. The following items Pt. indicated as a 1-cannot do: opening a medication bottle with a childproof cap, cutting nails with a clipper, buttoning clothing, picking up a 1/2 full water pitcher, and writing 3-4 lines legibly.  UPPER EXTREMITY ROM:     ROM Right eval Left eval  Shoulder flexion 130(150) [place]   Shoulder abduction 82(120) 72(102)  Shoulder adduction    Shoulder extension    Shoulder internal rotation    Shoulder external rotation    Elbow flexion WFL 148  Elbow extension [facility] -60(-28)  Wrist flexion 26(60) WFL  Wrist extension  44(62) WFL  Wrist ulnar deviation    Wrist radial deviation    Wrist pronation    Wrist supination    (Blank rows = not tested)  Eval: Digit flexion to the [facility]:    Right: 2nd: 5cm(2cm) 3rd: 0cm  4th: 0cm 5th: 5cm(3cm)     Left: 2nd: 1cm(1cm) 3rd: 2cm(0cm) 4th: 4cm(0cm) 5th: 5cm(3cm)  UPPER EXTREMITY MMT:     MMT Right eval Left eval  Shoulder flexion    Shoulder abduction 3-/5 3-/5  Shoulder adduction    Shoulder extension    Shoulder internal rotation    Shoulder external rotation    Middle trapezius    Lower trapezius    Elbow flexion 4/5 4/5  Elbow extension 4/5 2+/5  Wrist flexion 3-/5 4-/5  Wrist extension 3-/5 4-/5   Wrist ulnar deviation    Wrist radial deviation    Wrist pronation    Wrist supination    (Blank rows = not tested)  HAND FUNCTION: 05/30/24: Grip strength: Right: 8 lbs; Left: 12 lbs, Lateral pinch: Right: 4 lbs, Left: 3 lbs, and 3 point pinch: Right: 3 lbs, Left: 3 lbs  COORDINATION: 9 Hole Peg test: Right: 48 sec; Left: 49 sec  SENSATION: TBD   EDEMA: N/A    COGNITION: Overall cognitive status: Within functional limits for tasks assessed  VISION: Subjective report: No change from baseline  PERCEPTION: WFL                                                                                                                TREATMENT DATE: 06/14/24  Therapeutic Activities:   Theraputty hand strengthening exercises including: gross gripping, gross digit extension, lateral, and 3pt. pinch strengthening, thumb opposition, rolling circular spheres within the tips of the fingers, and manipulating putty within the tips of fingers to remove small objects. Pt. was provided with a visual handout HEP through [employer] with a video access code.   Self-Care:   -Assessed hand to mouth patterns of movement holding utensils with the right hand. -Assessed right hand grasp on a standard spoon, adjustable built-up handled spoons, a long handled hinge spoon, and a swivel spoon.     Therapeutic Ex:   Right:   -AROM for shoulder shoulder flexion, abduction, horizontal abduction, external rotation, elbow flexion, and extension. -Seated Shoulder flexion with 1.5# cuff weight for 2 reps, horizontal abduction with 10 reps with manual resistance, elbow flexion 7 reps with 1.5# cuff weight, forearm supination/pronation for 10 reps with 1# hand weight, wrist extension 10 reps with 1# hand weight. -AROM  for the right 2nd through 5th digit MCP, PIP, and DIP flexion followed by passive 2nd, and 5th digit flexion to the end range in preparation for formulating a composite fist.    PATIENT  EDUCATION: Education details:  Various adaptive spoons, UE strengthening, right hand strengthening with yellow theraputty with a HEP through [employer] Person educated: Patient Education method: Explanation, Demonstration, Tactile cues, and Verbal cues Education comprehension: verbalized understanding, returned demonstration, verbal cues required, tactile cues  required, and needs further education  HOME EXERCISE PROGRAM:    -Yellow theraputty strengthening  following a HEP through [employer]. -contrasting heat 3 min., followed by 1 min. Cold  for 1 min. Ending with heat for 3 min. To the right hand. (Pt. Prefers just heat at home), moist heat to the left hand. -Massage to the right hand  -Right hand digit extension with the hand flat at the tabletop surface -Blue resistive foam block for Bilateral gross gripping, lateral pinch, and 3pt. Pinch strength -UE shoulder stabilization in supine with the shoulder flexed to 90 degrees with the elbow extended-formulating the alphabet      GOALS: Goals reviewed with patient? Yes  SHORT TERM GOALS: Target date: 07/07/2024     Pt. Will be independent with HEPs for UE strength, and coordination Baseline: Eval: No current HEP Goal status: INITIAL   LONG TERM GOALS: Target date: 08/18/2024   To reduce pain by 3 grades on the pain scale in preparation for functional hand use during ADLs/IADLs Baseline: Eval:  5/10 pain in the right hand Goal status: INITIAL  2.  Pt. Will formulate a full composite fist in preparation for securely holding kitchen/cooking utensils. Baseline: Digit flexion to the IER:Mphyu: 2nd: 5cm(2cm) 3rd: 0cm  4th: 0cm 5th: 5cm(3cm) Left: 2nd: 1cm(1cm) 3rd: 2cm(0cm) 4th: 4cm(0cm) 5th: 5cm(3cm) Pt, has difficulty securely holding cooking utensils in her right hand Goal status: INITIAL  3.  Pt. Will efficiently perform hand to mouth patterns during self-feeding with modified independence using her right hand. Baseline: Eval: Pt.  Has difficulty completing hand to mouth patterns, often bringing her mouth down to meet the spoon 2/2 weakness in the RUE/hand Goal status: INITIAL  4.  Pt. Will cut meat on her plate efficiently with modified independence Baseline: Eval: Pt. Has difficulty cutting meat Goal status: INITIAL  5.  Pt. Will increase bilateral shoulder abduction by 10 degrees be able to efficiently perform hair care Baseline: Eval: shoulder abduction: Right: 82(120), Left: 72(102) Goal status: INITIAL  6.  Pt. Will write one sentence efficiently  with 75% in preparation for being able to fill in crossword puzzles. Baseline: Eval: writing legibility: 75% for name only. Goal status: INITIAL  ASSESSMENT:  CLINICAL IMPRESSION:  Pt. reports being discouraged that she is still having difficulty eating. Assessed simulated self-feeding use with a variety of adaptive spoons during hand to mouth patterns. Pt. Was able to keep both the swivel spoon, and the adjustable long handled spoons the most steady during the hand to mouth pattern. Pt. continues to bring her mouth down to meet the spoon 2/2 proximal UE weakness. Pt. continues to present with improved pain this week. Pt. was able to tolerate upgrading HEP for yellow theraputty resistive exercises without pain. Pt. was able to tolerate gradual addition of resistive exercise to the RUE without pain. Pt. continues to present with limited active right 2nd & 5th digit MCP/PIP/DIP flexion, and left 5th digit MCP/PIP/DIP flexion. Pt. continues benefit from OT services to work on improving right hand pain, and to improve overall  BUE, functioning in order to increase engagement of the UEs during ADLs, and IADL tasks.   PERFORMANCE DEFICITS: in functional skills including ADLs, IADLs, coordination, dexterity, ROM, strength, pain, Fine motor control, Gross motor control, and UE functional use, , and psychosocial skills including coping strategies, environmental adaptation, habits,  interpersonal interactions, and routines and behaviors.   IMPAIRMENTS: are limiting patient from ADLs, IADLs, and leisure.   CO-MORBIDITIES: may have co-morbidities  that  affects occupational performance. Patient will benefit from skilled OT to address above impairments and improve overall function.  MODIFICATION OR ASSISTANCE TO COMPLETE EVALUATION: Min-Moderate modification of tasks or assist with assess necessary to complete an evaluation.  OT OCCUPATIONAL PROFILE AND HISTORY: Detailed assessment: Review of records and additional review of physical, cognitive, psychosocial history related to current functional performance.  CLINICAL DECISION MAKING: Moderate - several treatment options, min-mod task modification necessary  REHAB POTENTIAL: Good  EVALUATION COMPLEXITY: Moderate    PLAN:  OT FREQUENCY: 2x/week  OT DURATION: 12 weeks  PLANNED INTERVENTIONS: 97168 OT Re-evaluation, 97535 self care/ADL training, 02889 therapeutic exercise, 97530 therapeutic activity, 97112 neuromuscular re-education, 97140 manual therapy, 97018 paraffin, 02989 moist heat, 97010 cryotherapy, 97034 contrast bath, 97760 Orthotic Initial, 97763 Orthotic/Prosthetic subsequent, passive range of motion, balance training, functional mobility training, energy conservation, coping strategies training, patient/family education, and DME and/or AE instructions  RECOMMENDED OTHER SERVICES: PT for balance  CONSULTED AND AGREED WITH PLAN OF CARE: Patient  PLAN FOR NEXT SESSION:  Assess sensation, and hand function skills at the next visit, as well administer the MAM-20 outcome measure; treatment  Richardson Otter, MS, OTR/L  06/14/2024, 12:10 PM

## 2024-06-20 ENCOUNTER — Ambulatory Visit: Admitting: Occupational Therapy

## 2024-06-20 ENCOUNTER — Encounter: Payer: Self-pay | Admitting: Occupational Therapy

## 2024-06-20 DIAGNOSIS — M6281 Muscle weakness (generalized): Secondary | ICD-10-CM

## 2024-06-20 DIAGNOSIS — R278 Other lack of coordination: Secondary | ICD-10-CM

## 2024-06-20 NOTE — Therapy (Signed)
 OUTPATIENT OCCUPATIONAL THERAPY TREATMENT NOTE  Patient Name: Maria Lowe MRN: [id number] DOB:09-Apr-1931, 89 y.o., female Today's Date: 06/20/2024   REFERRING PROVIDER: Katrinka Senior, MD  END OF SESSION:   OT End of Session - 06/20/24 1920     Visit Number 8    Number of Visits 24    Date for Recertification  08/17/24    OT Start Time 1015    OT Stop Time 1100    OT Time Calculation (min) 45 min    Activity Tolerance Patient tolerated treatment well    Behavior During Therapy WFL for tasks assessed/performed          Past Medical History:  Diagnosis Date   Anticoagulant long-term use    eliquis -- managed by cardiology   Arthritis    knees   CKD (chronic kidney disease), stage III (HCC)    CLL (chronic lymphocytic leukemia) (HCC) 11/27/2013   oncologist-- dr lonn;  initial dx 03/ 2015, no treatment, active survillance   Essential hypertension    followed by pcp   Full dentures    History of gastric ulcer    stomach ulcer when young   Lymphocytosis 2009   followed oncology   Permanent atrial fibrillation (HCC) 11/2020   cardiologist-- primary dr w. barbaraann and followed by atrial clinic;    dx 003/ 2022, atenolol  for rate control and on eliquis    Rectocele    Thrombocythemia 2009   related to CLL, followed by dr lonn   Urinary incontinence    Vaginal atrophy    Past Surgical History:  Procedure Laterality Date   APPENDECTOMY     1980s   CATARACT EXTRACTION W/ INTRAOCULAR LENS IMPLANT Bilateral 2020   RECTOCELE REPAIR N/A 08/25/2021   Procedure: POSTERIOR REPAIR (RECTOCELE);  Surgeon: Jertson, Jill Evelyn, MD;  Location: [facility];  Service: Gynecology;  Laterality: N/A;   TONSILLECTOMY AND ADENOIDECTOMY     age 50   Patient Active Problem List   Diagnosis Date Noted   Severe mitral regurgitation 05/23/2024   Deficiency anemia 04/21/2024   Dehydration 04/21/2024   Acute left-sided low back pain with left-sided sciatica 06/25/2022    H/O rectocele repair 08/25/2021   Secondary hypercoagulable state 12/10/2020   Persistent atrial fibrillation (HCC) 11/29/2020   Hyperlipidemia, unspecified 04/07/2018   CKD (chronic kidney disease), stage III (HCC) 02/09/2018   Rash 02/09/2018   Seborrheic dermatitis 01/08/2017   Former smoker 09/30/2015   Rectocele 03/29/2015   Thrombocytopenia 11/26/2014   CLL (chronic lymphocytic leukemia) (HCC) 11/27/2013   Hyperglycemia 05/08/2013   Essential hypertension, benign 11/07/2012   Obesity, unspecified 11/07/2012    ONSET DATE: 01/2024  REFERRING DIAG: Right hand weakness  THERAPY DIAG:  Muscle weakness (generalized)  Other lack of coordination  Rationale for Evaluation and Treatment: Rehabilitation  SUBJECTIVE:  Pt reports she has difficulty with reaching her mouth with self feeding, feels uncoordinated.    SUBJECTIVE STATEMENT: Pt. Reports no pain today Pt accompanied by: self; with daughter in the reception area  PERTINENT HISTORY:  Pt. was referred for outpatient OT services for right hand pain. Pt. had received a cat bite approximately one week prior to her going on a trip to [place]. She was hospitalized in [place] for one month due to developing a Cat Bite Pasturella Infection. While in the hospital, Pt. reports that she was placed in an arm cast. Pt. reports that while she was in the hospital she was bedbound, was not allowed to get out of  bed for the entire month, and became weak, and deconditioned. Pt. Reports that she does not know why she was placed in a cast. [place] return to the [place] Pt. started home health OT and PT services, however OT services were limited due to staffing constraints. Pt. was then referred to outpatient OT services. PMHx includes: Severe mitral valve regurgitation, pericadrial, and pleural effusions, A-Fib, Chronic Lymphocytic Anemia, multifactorial anemia and CKD stage III, Diverticulosis, constipation, HTN, Hyperlipidemia,  Prediabetes.  PRECAUTIONS: None  WEIGHT BEARING RESTRICTIONS: No  PAIN:  Are you having pain? No reports of pain today  FALLS: Has patient fallen in last 6 months? 1 fall in the last 6 months  LIVING ENVIRONMENT: Lives with: Resides with her daughter Lives in: House- one level Stairs: ramped entrance Has following equipment at home: Counselling psychologist, Environmental consultant - 4 wheeled, shower chair, bed side commode, and Grab bars, hospital bed, elevated toilet  PLOF: Independent, living alone, managing a household-per report  PATIENT GOALS:  To be able to cut her meat  OBJECTIVE:  Note: Objective measures were completed at Evaluation unless otherwise noted.  HAND DOMINANCE: Right  ADLs:  Transfers/ambulation related to ADLs: Supervision with a rollator walker Eating: Pt. Has difficulty with using utensils with the right hand, often bringing her head down to meet hand/utensil when eating. Pt. Has  weakness with bringing the cup to her mouth. Difficulty with cutting food. Grooming: Difficulty sustaining the BUE's in up long enough to perform hair care. Independent with oral care,  UB Dressing: Independent, requires increased time to complete. Pt. Does endorse having difficulty reaching up to put a shirt on over her head. LB Dressing:Reports being able to perform LE dressing, Uses slide-on footwear.  Toileting:Pt. Reports independently being able to perform toileting care needs using her left hand. (Of note, Pt. Reports that she has always used her left hand to perform toilet hygiene care.) Bathing: Independent, daughter provides supervision from outside the shower curtain. Daughter assists with drying her back due to limited reach Tub Shower transfers: Daughter provides supervision tub/shower t/f's   IADLs: Light housekeeping: Pt. reports that she has resumed some light housekeeping tasks, however this has been limited. Pt. Reports that she does not vacuum, or sweep due to rollator  use. Meal Prep: Pt. Has resumed weekly cooking on Sundays. Reports having difficulty cutting onions, as well as difficulty holding and using a beater. Pt. Reports that she uses a modified technique to hold kitchen/cooking utensils. Community mobility: Pt. Relies on family  and friends for transportation Medication management: Pt. Has difficulty manipulating medication efficiently with the right hand Handwriting: 50% legible for name only Leisure/Hobbies: Loves to cook; enjoys reading , and working crossword puzzles.  MOBILITY STATUS:  Supervision using a rollator  FUNCTIONAL OUTCOME MEASURES: 05/30/24: MAM-20: 11/20 items were completed. The following items Pt. indicated as a 2 -very hard to do: wringing out a towel, opening a wide mouth bottle-previously opened, cutting meat on a plate, tying shoe laces, zipping a jacket, and squeezing toothpaste on a toothbrush. The following items Pt. indicated as a 1-cannot do: opening a medication bottle with a childproof cap, cutting nails with a clipper, buttoning clothing, picking up a 1/2 full water pitcher, and writing 3-4 lines legibly.  UPPER EXTREMITY ROM:     ROM Right eval Left eval  Shoulder flexion 130(150) Scaption   Shoulder abduction 82(120) 72(102)  Shoulder adduction    Shoulder extension    Shoulder internal rotation    Shoulder external  rotation    Elbow flexion WFL 148  Elbow extension WFL -60(-28)  Wrist flexion 26(60) WFL  Wrist extension 44(62) WFL  Wrist ulnar deviation    Wrist radial deviation    Wrist pronation    Wrist supination    (Blank rows = not tested)  Eval: Digit flexion to the [facility]:    Right: 2nd: 5cm(2cm) 3rd: 0cm  4th: 0cm 5th: 5cm(3cm)     Left: 2nd: 1cm(1cm) 3rd: 2cm(0cm) 4th: 4cm(0cm) 5th: 5cm(3cm)  UPPER EXTREMITY MMT:     MMT Right eval Left eval  Shoulder flexion    Shoulder abduction 3-/5 3-/5  Shoulder adduction    Shoulder extension    Shoulder internal rotation    Shoulder external  rotation    Middle trapezius    Lower trapezius    Elbow flexion 4/5 4/5  Elbow extension 4/5 2+/5  Wrist flexion 3-/5 4-/5  Wrist extension 3-/5 4-/5  Wrist ulnar deviation    Wrist radial deviation    Wrist pronation    Wrist supination    (Blank rows = not tested)  HAND FUNCTION: 05/30/24: Grip strength: Right: 8 lbs; Left: 12 lbs, Lateral pinch: Right: 4 lbs, Left: 3 lbs, and 3 point pinch: Right: 3 lbs, Left: 3 lbs  COORDINATION: 9 Hole Peg test: Right: 48 sec; Left: 49 sec  SENSATION: TBD   EDEMA: N/A    COGNITION: Overall cognitive status: Within functional limits for tasks assessed  VISION: Subjective report: No change from baseline  PERCEPTION: WFL                                                                                                                TREATMENT DATE: 06/20/24  Pt denies pain this date.    Therapeutic Exercises: Pt seen for reaching/ROM tasks with use of SAEBO tower.  With right UE, pt able to complete 3rd level of reach with cues and encouragement.  With left UE, pt able to reach to 2nd level with occasional guiding and assist.   Difficulty with full fisting on right hand, small finger stays extended.    Therapeutic Activities: Pt seen for unknotting exercise with medium nylon rope with use of bilateral hands, increased time required and cues for prehension patterns.  Pt picking up small, med and large washers to place on small dowel stick, when removing she demonstrated difficulty with separating washers especially the smaller sized ones.  Difficulty with moving items to palm and unable to use the hand for storage.  Manipulation of small metal pegs from flat surface with difficulty.   PATIENT EDUCATION: Education details:  Various adaptive spoons, UE strengthening, right hand strengthening with yellow theraputty with a HEP through [employer] Person educated: Patient Education method: Explanation, Demonstration, Tactile cues, and Verbal  cues Education comprehension: verbalized understanding, returned demonstration, verbal cues required, tactile cues required, and needs further education  HOME EXERCISE PROGRAM:    -Yellow theraputty strengthening  following a HEP through [employer]. -contrasting heat 3 min., followed by 1 min. Cold  for 1 min. Ending with heat for 3 min. To the right hand. (Pt. Prefers just heat at home), moist heat to the left hand. -Massage to the right hand  -Right hand digit extension with the hand flat at the tabletop surface -Blue resistive foam block for Bilateral gross gripping, lateral pinch, and 3pt. Pinch strength -UE shoulder stabilization in supine with the shoulder flexed to 90 degrees with the elbow extended-formulating the alphabet    GOALS: Goals reviewed with patient? Yes  SHORT TERM GOALS: Target date: 07/07/2024     Pt. Will be independent with HEPs for UE strength, and coordination Baseline: Eval: No current HEP Goal status: INITIAL   LONG TERM GOALS: Target date: 08/18/2024   To reduce pain by 3 grades on the pain scale in preparation for functional hand use during ADLs/IADLs Baseline: Eval:  5/10 pain in the right hand Goal status: INITIAL  2.  Pt. Will formulate a full composite fist in preparation for securely holding kitchen/cooking utensils. Baseline: Digit flexion to the IER:Mphyu: 2nd: 5cm(2cm) 3rd: 0cm  4th: 0cm 5th: 5cm(3cm) Left: 2nd: 1cm(1cm) 3rd: 2cm(0cm) 4th: 4cm(0cm) 5th: 5cm(3cm) Pt, has difficulty securely holding cooking utensils in her right hand Goal status: INITIAL  3.  Pt. Will efficiently perform hand to mouth patterns during self-feeding with modified independence using her right hand. Baseline: Eval: Pt. Has difficulty completing hand to mouth patterns, often bringing her mouth down to meet the spoon 2/2 weakness in the RUE/hand Goal status: INITIAL  4.  Pt. Will cut meat on her plate efficiently with modified independence Baseline: Eval: Pt. Has  difficulty cutting meat Goal status: INITIAL  5.  Pt. Will increase bilateral shoulder abduction by 10 degrees be able to efficiently perform hair care Baseline: Eval: shoulder abduction: Right: 82(120), Left: 72(102) Goal status: INITIAL  6.  Pt. Will write one sentence efficiently  with 75% in preparation for being able to fill in crossword puzzles. Baseline: Eval: writing legibility: 75% for name only. Goal status: INITIAL  ASSESSMENT:  CLINICAL IMPRESSION: Pt continues to report difficulty with self feeding and coordination for hand to mouth.  Pt demonstrates difficulty with fisting on right and therefore has difficulty with using the hand for storage.  Small finger on the right tends to stay in extension.  Pt able to achieve 3rd level of SAEBO tower with right UE with reaching, 2nd level with difficulty and requires assistance on left.  Continue to work towards goals in plan of care to maximize safety and independence in necessary daily tasks.   PERFORMANCE DEFICITS: in functional skills including ADLs, IADLs, coordination, dexterity, ROM, strength, pain, Fine motor control, Gross motor control, and UE functional use, , and psychosocial skills including coping strategies, environmental adaptation, habits, interpersonal interactions, and routines and behaviors.   IMPAIRMENTS: are limiting patient from ADLs, IADLs, and leisure.   CO-MORBIDITIES: may have co-morbidities  that affects occupational performance. Patient will benefit from skilled OT to address above impairments and improve overall function.  MODIFICATION OR ASSISTANCE TO COMPLETE EVALUATION: Min-Moderate modification of tasks or assist with assess necessary to complete an evaluation.  OT OCCUPATIONAL PROFILE AND HISTORY: Detailed assessment: Review of records and additional review of physical, cognitive, psychosocial history related to current functional performance.  CLINICAL DECISION MAKING: Moderate - several treatment  options, min-mod task modification necessary  REHAB POTENTIAL: Good  EVALUATION COMPLEXITY: Moderate   PLAN:  OT FREQUENCY: 2x/week  OT DURATION: 12 weeks  PLANNED INTERVENTIONS: 02831 OT Re-evaluation, 97535 self care/ADL training,  97110 therapeutic exercise, 97530 therapeutic activity, 97112 neuromuscular re-education, 97140 manual therapy, 97018 paraffin, 02989 moist heat, 97010 cryotherapy, 97034 contrast bath, 97760 Orthotic Initial, 97763 Orthotic/Prosthetic subsequent, passive range of motion, balance training, functional mobility training, energy conservation, coping strategies training, patient/family education, and DME and/or AE instructions  RECOMMENDED OTHER SERVICES: PT for balance  CONSULTED AND AGREED WITH PLAN OF CARE: Patient  PLAN FOR NEXT SESSION:  Assess sensation, and hand function skills at the next visit, as well administer the MAM-20 outcome measure; treatment  Laneya Gasaway T Steve Youngberg, OTR/L, CLT  06/20/2024, 7:21 PM

## 2024-06-22 ENCOUNTER — Ambulatory Visit: Attending: Family Medicine | Admitting: Occupational Therapy

## 2024-06-22 DIAGNOSIS — R278 Other lack of coordination: Secondary | ICD-10-CM | POA: Diagnosis not present

## 2024-06-22 DIAGNOSIS — M6281 Muscle weakness (generalized): Secondary | ICD-10-CM | POA: Diagnosis not present

## 2024-06-22 NOTE — Therapy (Signed)
 OUTPATIENT OCCUPATIONAL THERAPY TREATMENT NOTE  Patient Name: Maria Lowe MRN: [id number] DOB:[birth year], 89 y.o., female Today's Date: 06/22/2024   REFERRING PROVIDER: Katrinka Senior, MD  END OF SESSION:   OT End of Session - 06/22/24 1218     Visit Number 9    Number of Visits 24    Date for Recertification  08/17/24    OT Start Time 0845    OT Stop Time 0930    OT Time Calculation (min) 45 min    Activity Tolerance Patient tolerated treatment well    Behavior During Therapy WFL for tasks assessed/performed          Past Medical History:  Diagnosis Date   Anticoagulant long-term use    eliquis -- managed by cardiology   Arthritis    knees   CKD (chronic kidney disease), stage III (HCC)    CLL (chronic lymphocytic leukemia) (HCC) 11/27/2013   oncologist-- dr lonn;  initial dx 03/ 2015, no treatment, active survillance   Essential hypertension    followed by pcp   Full dentures    History of gastric ulcer    stomach ulcer when young   Lymphocytosis 2009   followed oncology   Permanent atrial fibrillation (HCC) 11/2020   cardiologist-- primary dr w. barbaraann and followed by atrial clinic;    dx 003/ 2022, atenolol  for rate control and on eliquis    Rectocele    Thrombocythemia 2009   related to CLL, followed by dr lonn   Urinary incontinence    Vaginal atrophy    Past Surgical History:  Procedure Laterality Date   APPENDECTOMY     1980s   CATARACT EXTRACTION W/ INTRAOCULAR LENS IMPLANT Bilateral 2020   RECTOCELE REPAIR N/A 08/25/2021   Procedure: POSTERIOR REPAIR (RECTOCELE);  Surgeon: Jertson, Jill Evelyn, MD;  Location: [facility];  Service: Gynecology;  Laterality: N/A;   TONSILLECTOMY AND ADENOIDECTOMY     age 75   Patient Active Problem List   Diagnosis Date Noted   Severe mitral regurgitation 05/23/2024   Deficiency anemia 04/21/2024   Dehydration 04/21/2024   Acute left-sided low back pain with left-sided sciatica 06/25/2022    H/O rectocele repair 08/25/2021   Secondary hypercoagulable state 12/10/2020   Persistent atrial fibrillation (HCC) 11/29/2020   Hyperlipidemia, unspecified 04/07/2018   CKD (chronic kidney disease), stage III (HCC) 02/09/2018   Rash 02/09/2018   Seborrheic dermatitis 01/08/2017   Former smoker 09/30/2015   Rectocele 03/29/2015   Thrombocytopenia 11/26/2014   CLL (chronic lymphocytic leukemia) (HCC) 11/27/2013   Hyperglycemia 05/08/2013   Essential hypertension, benign 11/07/2012   Obesity, unspecified 11/07/2012    ONSET DATE: 01/2024  REFERRING DIAG: Right hand weakness  THERAPY DIAG:  Muscle weakness (generalized)  Other lack of coordination  Rationale for Evaluation and Treatment: Rehabilitation  SUBJECTIVE:  Pt reports she has difficulty with reaching her mouth with self feeding, feels uncoordinated.    SUBJECTIVE STATEMENT: Pt. Reports no pain today Pt accompanied by: self; with daughter in the reception area  PERTINENT HISTORY:  Pt. was referred for outpatient OT services for right hand pain. Pt. had received a cat bite approximately one week prior to her going on a trip to [place]. She was hospitalized in [place] for one month due to developing a Cat Bite Pasturella Infection. While in the hospital, Pt. reports that she was placed in an arm cast. Pt. reports that while she was in the hospital she was bedbound, was not allowed to get out of  bed for the entire month, and became weak, and deconditioned. Pt. Reports that she does not know why she was placed in a cast. [place] return to the [place] Pt. started home health OT and PT services, however OT services were limited due to staffing constraints. Pt. was then referred to outpatient OT services. PMHx includes: Severe mitral valve regurgitation, pericadrial, and pleural effusions, A-Fib, Chronic Lymphocytic Anemia, multifactorial anemia and CKD stage III, Diverticulosis, constipation, HTN, Hyperlipidemia,  Prediabetes.  PRECAUTIONS: None  WEIGHT BEARING RESTRICTIONS: No  PAIN:  Are you having pain? No reports of pain today  FALLS: Has patient fallen in last 6 months? 1 fall in the last 6 months  LIVING ENVIRONMENT: Lives with: Resides with her daughter Lives in: House- one level Stairs: ramped entrance Has following equipment at home: Counselling psychologist, Environmental consultant - 4 wheeled, shower chair, bed side commode, and Grab bars, hospital bed, elevated toilet  PLOF: Independent, living alone, managing a household-per report  PATIENT GOALS:  To be able to cut her meat  OBJECTIVE:  Note: Objective measures were completed at Evaluation unless otherwise noted.  HAND DOMINANCE: Right  ADLs:  Transfers/ambulation related to ADLs: Supervision with a rollator walker Eating: Pt. Has difficulty with using utensils with the right hand, often bringing her head down to meet hand/utensil when eating. Pt. Has  weakness with bringing the cup to her mouth. Difficulty with cutting food. Grooming: Difficulty sustaining the BUE's in up long enough to perform hair care. Independent with oral care,  UB Dressing: Independent, requires increased time to complete. Pt. Does endorse having difficulty reaching up to put a shirt on over her head. LB Dressing:Reports being able to perform LE dressing, Uses slide-on footwear.  Toileting:Pt. Reports independently being able to perform toileting care needs using her left hand. (Of note, Pt. Reports that she has always used her left hand to perform toilet hygiene care.) Bathing: Independent, daughter provides supervision from outside the shower curtain. Daughter assists with drying her back due to limited reach Tub Shower transfers: Daughter provides supervision tub/shower t/f's   IADLs: Light housekeeping: Pt. reports that she has resumed some light housekeeping tasks, however this has been limited. Pt. Reports that she does not vacuum, or sweep due to rollator  use. Meal Prep: Pt. Has resumed weekly cooking on Sundays. Reports having difficulty cutting onions, as well as difficulty holding and using a beater. Pt. Reports that she uses a modified technique to hold kitchen/cooking utensils. Community mobility: Pt. Relies on family  and friends for transportation Medication management: Pt. Has difficulty manipulating medication efficiently with the right hand Handwriting: 50% legible for name only Leisure/Hobbies: Loves to cook; enjoys reading , and working crossword puzzles.  MOBILITY STATUS:  Supervision using a rollator  FUNCTIONAL OUTCOME MEASURES: 05/30/24: MAM-20: 11/20 items were completed. The following items Pt. indicated as a 2 -very hard to do: wringing out a towel, opening a wide mouth bottle-previously opened, cutting meat on a plate, tying shoe laces, zipping a jacket, and squeezing toothpaste on a toothbrush. The following items Pt. indicated as a 1-cannot do: opening a medication bottle with a childproof cap, cutting nails with a clipper, buttoning clothing, picking up a 1/2 full water pitcher, and writing 3-4 lines legibly.  UPPER EXTREMITY ROM:     ROM Right eval Left eval  Shoulder flexion 130(150) [place]   Shoulder abduction 82(120) 72(102)  Shoulder adduction    Shoulder extension    Shoulder internal rotation    Shoulder external  rotation    Elbow flexion WFL 148  Elbow extension WFL -60(-28)  Wrist flexion 26(60) WFL  Wrist extension 44(62) WFL  Wrist ulnar deviation    Wrist radial deviation    Wrist pronation    Wrist supination    (Blank rows = not tested)  Eval: Digit flexion to the [facility] [place]:    Right: 2nd: 5cm(2cm) 3rd: 0cm  4th: 0cm 5th: 5cm(3cm)     Left: 2nd: 1cm(1cm) 3rd: 2cm(0cm) 4th: 4cm(0cm) 5th: 5cm(3cm)  UPPER EXTREMITY MMT:     MMT Right eval Left eval  Shoulder flexion    Shoulder abduction 3-/5 3-/5  Shoulder adduction    Shoulder extension    Shoulder internal rotation    Shoulder external  rotation    Middle trapezius    Lower trapezius    Elbow flexion 4/5 4/5  Elbow extension 4/5 2+/5  Wrist flexion 3-/5 4-/5  Wrist extension 3-/5 4-/5  Wrist ulnar deviation    Wrist radial deviation    Wrist pronation    Wrist supination    (Blank rows = not tested)  HAND FUNCTION: 05/30/24: Grip strength: Right: 8 lbs; Left: 12 lbs, Lateral pinch: Right: 4 lbs, Left: 3 lbs, and 3 point pinch: Right: 3 lbs, Left: 3 lbs  COORDINATION: 9 Hole Peg test: Right: 48 sec; Left: 49 sec  SENSATION: TBD   EDEMA: N/A    COGNITION: Overall cognitive status: Within functional limits for tasks assessed  VISION: Subjective report: No change from baseline  PERCEPTION: WFL                                                                                                                TREATMENT DATE: 06/22/24  Therapeutic Exercises:  -AROM/PROM for the right 2nd through 5th digit MCP, PIP, and DIP flexion followed by passive 2nd, and 5th digit flexion to the end range in preparation for formulating a composite fist. AROM for the right 2nd through 5th digit MCP, PIP, and DIP flexion followed by passive 2nd, and 5th digit flexion to the end range in preparation for formulating a composite fist.  -Passive stretching for Left 5th digit MP, PIP, and DIP flexion in preparation for formulating a composite fist.   Therapeutic Activities:  -Pt. worked on BUE functional reaching, grasping large flat shapes, and reaching up to move them through progressively 1-3 increasing heights of vertical dowels on the tabletop surface. -Support was provided proximally, modified, graded, and tapered as needed. -Facilitated a combination of pinch strengthening with hand function skills. Pt. Performed Lateral, and 3pt. Pinch strengthening using yellow, red, green, and blue, and black level resistive clips. Pt. Then performed translatory movements moving clips from the lateral pinch position to the 3pt. pinch position  in preparation for securely placing them on the dowel to promote hand function skills.  PATIENT EDUCATION: Education details:  functional reaching, digit ROM, and translatory movements. Person educated: Patient Education method: Explanation, Demonstration, Tactile cues, and Verbal cues Education comprehension: verbalized understanding, returned demonstration, verbal cues required, tactile  cues required, and needs further education  HOME EXERCISE PROGRAM:    -Yellow theraputty strengthening  following a HEP through [employer]. -contrasting heat 3 min., followed by 1 min. Cold  for 1 min. Ending with heat for 3 min. To the right hand. (Pt. Prefers just heat at home), moist heat to the left hand. -Massage to the right hand  -Right hand digit extension with the hand flat at the tabletop surface -Blue resistive foam block for Bilateral gross gripping, lateral pinch, and 3pt. Pinch strength -UE shoulder stabilization in supine with the shoulder flexed to 90 degrees with the elbow extended-formulating the alphabet    GOALS: Goals reviewed with patient? Yes  SHORT TERM GOALS: Target date: 07/07/2024     Pt. Will be independent with HEPs for UE strength, and coordination Baseline: Eval: No current HEP Goal status: INITIAL   LONG TERM GOALS: Target date: 08/18/2024   To reduce pain by 3 grades on the pain scale in preparation for functional hand use during ADLs/IADLs Baseline: Eval:  5/10 pain in the right hand Goal status: INITIAL  2.  Pt. Will formulate a full composite fist in preparation for securely holding kitchen/cooking utensils. Baseline: Digit flexion to the IER:Mphyu: 2nd: 5cm(2cm) 3rd: 0cm  4th: 0cm 5th: 5cm(3cm) Left: 2nd: 1cm(1cm) 3rd: 2cm(0cm) 4th: 4cm(0cm) 5th: 5cm(3cm) Pt, has difficulty securely holding cooking utensils in her right hand Goal status: INITIAL  3.  Pt. Will efficiently perform hand to mouth patterns during self-feeding with modified independence using her  right hand. Baseline: Eval: Pt. Has difficulty completing hand to mouth patterns, often bringing her mouth down to meet the spoon 2/2 weakness in the RUE/hand Goal status: INITIAL  4.  Pt. Will cut meat on her plate efficiently with modified independence Baseline: Eval: Pt. Has difficulty cutting meat Goal status: INITIAL  5.  Pt. Will increase bilateral shoulder abduction by 10 degrees be able to efficiently perform hair care Baseline: Eval: shoulder abduction: Right: 82(120), Left: 72(102) Goal status: INITIAL  6.  Pt. Will write one sentence efficiently  with 75% in preparation for being able to fill in crossword puzzles. Baseline: Eval: writing legibility: 75% for name only. Goal status: INITIAL  ASSESSMENT:  CLINICAL IMPRESSION:  Pt. has improved with pain in the right hand, reporting no pain this morning. Pt. required assist, proximal support, and frequent rest breaks during the reaching tasks. Pt. requires frequent cuing, and cues for form and technique with the pinch strengthening tasks, as well as when moving them through her hand from one pinch position to another. Pt. requires some mirroring of the translatory movements. Pt. continues to work towards goals in plan of care to maximize safety and independence in necessary daily tasks.   PERFORMANCE DEFICITS: in functional skills including ADLs, IADLs, coordination, dexterity, ROM, strength, pain, Fine motor control, Gross motor control, and UE functional use, , and psychosocial skills including coping strategies, environmental adaptation, habits, interpersonal interactions, and routines and behaviors.   IMPAIRMENTS: are limiting patient from ADLs, IADLs, and leisure.   CO-MORBIDITIES: may have co-morbidities  that affects occupational performance. Patient will benefit from skilled OT to address above impairments and improve overall function.  MODIFICATION OR ASSISTANCE TO COMPLETE EVALUATION: Min-Moderate modification of tasks  or assist with assess necessary to complete an evaluation.  OT OCCUPATIONAL PROFILE AND HISTORY: Detailed assessment: Review of records and additional review of physical, cognitive, psychosocial history related to current functional performance.  CLINICAL DECISION MAKING: Moderate - several treatment options, min-mod task modification  necessary  REHAB POTENTIAL: Good  EVALUATION COMPLEXITY: Moderate   PLAN:  OT FREQUENCY: 2x/week  OT DURATION: 12 weeks  PLANNED INTERVENTIONS: 97168 OT Re-evaluation, 97535 self care/ADL training, 02889 therapeutic exercise, 97530 therapeutic activity, 97112 neuromuscular re-education, 97140 manual therapy, 97018 paraffin, 02989 moist heat, 97010 cryotherapy, 97034 contrast bath, 97760 Orthotic Initial, 97763 Orthotic/Prosthetic subsequent, passive range of motion, balance training, functional mobility training, energy conservation, coping strategies training, patient/family education, and DME and/or AE instructions  RECOMMENDED OTHER SERVICES: PT for balance  CONSULTED AND AGREED WITH PLAN OF CARE: Patient  PLAN FOR NEXT SESSION:  Assess sensation, and hand function skills at the next visit, as well administer the MAM-20 outcome measure; treatment  Richardson Otter, MS, OTR/L  06/22/2024, 12:19 PM

## 2024-06-26 ENCOUNTER — Ambulatory Visit

## 2024-06-27 ENCOUNTER — Ambulatory Visit: Admitting: Occupational Therapy

## 2024-06-27 ENCOUNTER — Encounter: Admitting: Occupational Therapy

## 2024-06-27 DIAGNOSIS — M6281 Muscle weakness (generalized): Secondary | ICD-10-CM | POA: Diagnosis not present

## 2024-06-27 DIAGNOSIS — R278 Other lack of coordination: Secondary | ICD-10-CM

## 2024-06-27 NOTE — Therapy (Addendum)
 Occupational Therapy Progress Note  Dates of reporting period  05/25/24  to   06/27/24   Patient Name: Maria Lowe MRN: [id number] DOB:September 17, 1931, 89 y.o., female Today's Date: 06/27/2024   REFERRING PROVIDER: Katrinka Senior, MD  END OF SESSION:   OT End of Session - 06/27/24 1319     Visit Number 10    Number of Visits 24    Date for Recertification  08/17/24    OT Start Time 1315    OT Stop Time 1400    OT Time Calculation (min) 45 min    Activity Tolerance Patient tolerated treatment well    Behavior During Therapy WFL for tasks assessed/performed          Past Medical History:  Diagnosis Date   Anticoagulant long-term use    eliquis -- managed by cardiology   Arthritis    knees   CKD (chronic kidney disease), stage III (HCC)    CLL (chronic lymphocytic leukemia) (HCC) 11/27/2013   oncologist-- dr lonn;  initial dx 03/ 2015, no treatment, active survillance   Essential hypertension    followed by pcp   Full dentures    History of gastric ulcer    stomach ulcer when young   Lymphocytosis 2009   followed oncology   Permanent atrial fibrillation (HCC) 11/2020   cardiologist-- primary dr w. barbaraann and followed by atrial clinic;    dx 003/ 2022, atenolol  for rate control and on eliquis    Rectocele    Thrombocythemia 2009   related to CLL, followed by dr lonn   Urinary incontinence    Vaginal atrophy    Past Surgical History:  Procedure Laterality Date   APPENDECTOMY     1980s   CATARACT EXTRACTION W/ INTRAOCULAR LENS IMPLANT Bilateral 2020   RECTOCELE REPAIR N/A 08/25/2021   Procedure: POSTERIOR REPAIR (RECTOCELE);  Surgeon: Jertson, Jill Evelyn, MD;  Location: [facility];  Service: Gynecology;  Laterality: N/A;   TONSILLECTOMY AND ADENOIDECTOMY     age 71   Patient Active Problem List   Diagnosis Date Noted   Severe mitral regurgitation 05/23/2024   Deficiency anemia 04/21/2024   Dehydration 04/21/2024   Acute left-sided low  back pain with left-sided sciatica 06/25/2022   H/O rectocele repair 08/25/2021   Secondary hypercoagulable state 12/10/2020   Persistent atrial fibrillation (HCC) 11/29/2020   Hyperlipidemia, unspecified 04/07/2018   CKD (chronic kidney disease), stage III (HCC) 02/09/2018   Rash 02/09/2018   Seborrheic dermatitis 01/08/2017   Former smoker 09/30/2015   Rectocele 03/29/2015   Thrombocytopenia 11/26/2014   CLL (chronic lymphocytic leukemia) (HCC) 11/27/2013   Hyperglycemia 05/08/2013   Essential hypertension, benign 11/07/2012   Obesity, unspecified 11/07/2012    ONSET DATE: 01/2024  REFERRING DIAG: Right hand weakness  THERAPY DIAG:  Muscle weakness (generalized)  Other lack of coordination  Rationale for Evaluation and Treatment: Rehabilitation  SUBJECTIVE:   Pt. reports that she has been cross this morning, and today.  SUBJECTIVE STATEMENT:  Pt. reports no pain today Pt accompanied by: self; with daughter in the reception area  PERTINENT HISTORY:  Pt. was referred for outpatient OT services for right hand pain. Pt. had received a cat bite approximately one week prior to her going on a trip to [place]. She was hospitalized in [place] for one month due to developing a Cat Bite Pasturella Infection. While in the hospital, Pt. reports that she was placed in an arm cast. Pt. reports that while she was in the hospital she  was bedbound, was not allowed to get out of bed for the entire month, and became weak, and deconditioned. Pt. Reports that she does not know why she was placed in a cast. [place] return to the [place] Pt. started home health OT and PT services, however OT services were limited due to staffing constraints. Pt. was then referred to outpatient OT services. PMHx includes: Severe mitral valve regurgitation, pericadrial, and pleural effusions, A-Fib, Chronic Lymphocytic Anemia, multifactorial anemia and CKD stage III, Diverticulosis, constipation, HTN, Hyperlipidemia,  Prediabetes.  PRECAUTIONS: None  WEIGHT BEARING RESTRICTIONS: No  PAIN:  Are you having pain? No reports of pain today  FALLS: Has patient fallen in last 6 months? 1 fall in the last 6 months  LIVING ENVIRONMENT: Lives with: Resides with her daughter Lives in: House- one level Stairs: ramped entrance Has following equipment at home: Counselling psychologist, Environmental consultant - 4 wheeled, shower chair, bed side commode, and Grab bars, hospital bed, elevated toilet  PLOF: Independent, living alone, managing a household-per report  PATIENT GOALS:  To be able to cut her meat  OBJECTIVE:  Note: Objective measures were completed at Evaluation unless otherwise noted.  HAND DOMINANCE: Right  ADLs:  Transfers/ambulation related to ADLs: Supervision with a rollator walker Eating: Pt. Has difficulty with using utensils with the right hand, often bringing her head down to meet hand/utensil when eating. Pt. Has  weakness with bringing the cup to her mouth. Difficulty with cutting food. Grooming: Difficulty sustaining the BUE's in up long enough to perform hair care. Independent with oral care,  UB Dressing: Independent, requires increased time to complete. Pt. Does endorse having difficulty reaching up to put a shirt on over her head. LB Dressing:Reports being able to perform LE dressing, Uses slide-on footwear.  Toileting:Pt. Reports independently being able to perform toileting care needs using her left hand. (Of note, Pt. Reports that she has always used her left hand to perform toilet hygiene care.) Bathing: Independent, daughter provides supervision from outside the shower curtain. Daughter assists with drying her back due to limited reach Tub Shower transfers: Daughter provides supervision tub/shower t/f's   IADLs: Light housekeeping: Pt. reports that she has resumed some light housekeeping tasks, however this has been limited. Pt. Reports that she does not vacuum, or sweep due to rollator  use. Meal Prep: Pt. Has resumed weekly cooking on Sundays. Reports having difficulty cutting onions, as well as difficulty holding and using a beater. Pt. Reports that she uses a modified technique to hold kitchen/cooking utensils. Community mobility: Pt. Relies on family  and friends for transportation Medication management: Pt. Has difficulty manipulating medication efficiently with the right hand Handwriting: 50% legible for name only Leisure/Hobbies: Loves to cook; enjoys reading , and working crossword puzzles.  MOBILITY STATUS:  Supervision using a rollator  FUNCTIONAL OUTCOME MEASURES: 05/30/24: MAM-20: 11/20 items were completed. The following items Pt. indicated as a 2 -very hard to do: wringing out a towel, opening a wide mouth bottle-previously opened, cutting meat on a plate, tying shoe laces, zipping a jacket, and squeezing toothpaste on a toothbrush. The following items Pt. indicated as a 1-cannot do: opening a medication bottle with a childproof cap, cutting nails with a clipper, buttoning clothing, picking up a 1/2 full water pitcher, and writing 3-4 lines legibly.  UPPER EXTREMITY ROM:     ROM Right eval Right 06/27/24 Left eval Left 06/27/24  Shoulder flexion 130(150) Scaption 134(150)  100 scaption  Shoulder abduction 82(120) 109(120) 72(102) 892(887)  Shoulder adduction      Shoulder extension      Shoulder internal rotation      Shoulder external rotation      Elbow flexion [facility] WFL 148 WFL  Elbow extension [facility] WFL -60(-28) -35(-18)  Wrist flexion 26(60) 30(60) WFL WFL  Wrist extension 44(62) 46(62) WFL WFL  Wrist ulnar deviation      Wrist radial deviation      Wrist pronation      Wrist supination      (Blank rows = not tested)  Eval: Digit flexion to the [facility]:    Right: 2nd: 5cm(2cm) 3rd: 0cm  4th: 0cm 5th: 5cm(3cm)     Left: 2nd: 1cm(1cm) 3rd: 2cm(0cm) 4th: 4cm(0cm) 5th: 5cm(3cm)    06/27/24:  Digit flexion to the [facility]:    Right: 2nd: 4.5cm(2cm) 3rd:  2.5cm(0cm)  4th: 2cm (0cm) 5th: 5cm(2cm)     Left: 2nd: 1cm(1cm) 3rd: 1.96m(1cm) 4th: 1.5cm(1cm) 5th: 5cm(1.5cm)  UPPER EXTREMITY MMT:     MMT Right eval Right  06/27/24 Left eval Left 06/27/24  Shoulder flexion  3+/5  3-/5  Shoulder abduction 3-/5 3+/5 3-/5 3+/5  Shoulder adduction      Shoulder extension      Shoulder internal rotation      Shoulder external rotation      Middle trapezius      Lower trapezius      Elbow flexion 4/5 4/5 4/5 4/5  Elbow extension 4/5 4/5 2+/5 3-/5  Wrist flexion 3-/5 3-/5 4-/5 4-/5  Wrist extension 3-/5 3/5 4-/5 4-/5  Wrist ulnar deviation      Wrist radial deviation      Wrist pronation      Wrist supination      (Blank rows = not tested)  HAND FUNCTION: 05/30/24: Grip strength: Right: 8 lbs; Left: 12 lbs, Lateral pinch: Right: 4 lbs, Left: 3 lbs, and 3 point pinch: Right: 3 lbs, Left: 3 lbs  06/27/24: Grip strength: Right: 11 lbs; Left: 12 lbs, Lateral pinch: Right: 4 lbs, Left: 3 lbs, and 3 point pinch: Right: 3 lbs, Left: 3 lbs  COORDINATION:  COORDINATION: Eval:   9 Hole Peg test: Right: 48 sec; Left: 49 sec  06/27/24:  9 Hole Peg test: Right: 33 sec; Left: 49 sec  SENSATION: TBD   EDEMA: N/A    COGNITION: Overall cognitive status: Within functional limits for tasks assessed  VISION: Subjective report: No change from baseline  PERCEPTION: WFL                                                                                                                TREATMENT DATE: 06/27/24  Measurements were obtained and goals were reviewed with the Pt.  PATIENT EDUCATION: Education details:  functional reaching, digit ROM, and translatory movements. Person educated: Patient Education method: Explanation, Demonstration, Tactile cues, and Verbal cues Education comprehension: verbalized understanding, returned demonstration, verbal cues required, tactile cues required, and needs further education  HOME EXERCISE  PROGRAM:    -Yellow theraputty strengthening  following a HEP through [employer]. -contrasting heat 3 min., followed by 1 min. Cold  for 1 min. Ending with heat for 3 min. To the right hand. (Pt. Prefers just heat at home), moist heat to the left hand. -Massage to the right hand  -Right hand digit extension with the hand flat at the tabletop surface -Blue resistive foam block for Bilateral gross gripping, lateral pinch, and 3pt. Pinch strength -UE shoulder stabilization in supine with the shoulder flexed to 90 degrees with the elbow extended-formulating the alphabet    GOALS: Goals reviewed with patient? Yes  SHORT TERM GOALS: Target date: 07/07/2024     Pt. Will be independent with HEPs for UE strength, and coordination Baseline: 06/27/24: Independent; continue Eval: No current HEP Goal status: Ongoing   LONG TERM GOALS: Target date: 08/18/2024   To reduce pain by 3 grades on the pain scale in preparation for functional hand use during ADLs/IADLs Baseline: 06/27/24: 0/10 pain in the right hand. Pt. reports discomfort intermittently with left 5th digit flexion. Eval:  5/10 pain in the right hand Goal status: Progressed; Achieved  2.  Pt. Will formulate a full composite fist in preparation for securely holding kitchen/cooking utensils. Baseline:06/27/24: Digit flexion to the [facility]: Right: 2nd: 4.5cm(2cm) 3rd: 2.5cm(0cm)  4th: 2cm (0cm) 5th: 5cm(2cm) Left: 2nd: 1cm(1cm) 3rd: 1.30m(1cm) 4th: 1.5cm(1cm) 5th: 5cm(1.5cm) Pt. Had difficulty with holding and using utensils during self-feeding, Pt. Is improving with larger cooking utensilsEval: Digit flexion to the I[facility]:Mphyu: 2nd: 5cm(2cm) 3rd: 0cm  4th: 0cm 5th: 5cm(3cm) Left: 2nd: 1cm(1cm) 3rd: 2cm(0cm) 4th: 4cm(0cm) 5th: 5cm(3cm) Pt, has difficulty securely holding cooking utensils in her right hand Goal status: Progressing, Ongoing  3.  Pt. Will efficiently perform hand to mouth patterns during self-feeding with modified independence using her  right hand. Baseline: 06/27/24: Pt. continues to have difficulty bringing her spoon all the way to her mouth, often needing to bring her mouth down to her spoon. Eval: Pt. Has difficulty completing hand to mouth patterns, often bringing her mouth down to meet the spoon 2/2 weakness in the RUE/hand Goal status: Progressing, Ongoing  4.  Pt. Will cut meat on her plate efficiently with modified independence Baseline: 06/27/24: Pt. Is improving with cutting meat, over continues to have difficulty cutting meat. Eval: Pt. Has difficulty cutting meat Goal status:  Progressing, Ongoing   5.  Pt. Will increase bilateral shoulder abduction by 10 degrees be able to efficiently perform hair care Baseline: 06/27/24: Shoulder abduction: Right: 109(120), Left: 892(887) pt. Is able to reach up further to her head, however has difficulty sustaining BUE in elevation while performing hair care. Eval: shoulder abduction: Right: 82(120), Left: 72(102) Goal status: Progressing  6.  Pt. Will write one sentence efficiently  with 75% in preparation for being able to fill in crossword puzzles. Baseline: 06/27/24: Name only with 90% legibility; one sentence completed with 50% legibility Eval: writing legibility: 75% for name only. Goal status: INITIAL  ASSESSMENT:  CLINICAL IMPRESSION:  Measurements were obtained, and goals were reviewed with the patient. Pt. right hand pain has improved since the initial evaluation. Pt. has achieved with the established goal. Pt. has made progress over this progress reporting period with bilateral shoulder ROM, left elbow extension, bilateral digit flexion towards the [facility], right grip strength, and right hand [facility] speed and dexterity skills. Pt. is improving with bilateral UE functional reaching, however continues to present with weakness limiting her UE's sustained in elevation while performing hair care. Pt. is improving with grasping  utensils, however continues to have difficulty  performing the hand to mouth pattern during self-feeding often requiring her to bring her head down to meet the utensil 2/2 weakness. Pt. has difficulty using cooking utensils to flip and turn items, and is able to stir against very little resistance. Pt. continues benefit from OT services to work on improving right hand pain, and to improve overall  BUE, functioning in order to increase engagement of the UEs during ADLs, and IADL tasks.      PERFORMANCE DEFICITS: in functional skills including ADLs, IADLs, coordination, dexterity, ROM, strength, pain, Fine motor control, Gross motor control, and UE functional use, , and psychosocial skills including coping strategies, environmental adaptation, habits, interpersonal interactions, and routines and behaviors.   IMPAIRMENTS: are limiting patient from ADLs, IADLs, and leisure.   CO-MORBIDITIES: may have co-morbidities  that affects occupational performance. Patient will benefit from skilled OT to address above impairments and improve overall function.  MODIFICATION OR ASSISTANCE TO COMPLETE EVALUATION: Min-Moderate modification of tasks or assist with assess necessary to complete an evaluation.  OT OCCUPATIONAL PROFILE AND HISTORY: Detailed assessment: Review of records and additional review of physical, cognitive, psychosocial history related to current functional performance.  CLINICAL DECISION MAKING: Moderate - several treatment options, min-mod task modification necessary  REHAB POTENTIAL: Good  EVALUATION COMPLEXITY: Moderate   PLAN:  OT FREQUENCY: 2x/week  OT DURATION: 12 weeks  PLANNED INTERVENTIONS: 97168 OT Re-evaluation, 97535 self care/ADL training, 02889 therapeutic exercise, 97530 therapeutic activity, 97112 neuromuscular re-education, 97140 manual therapy, 97018 paraffin, 02989 moist heat, 97010 cryotherapy, 97034 contrast bath, 97760 Orthotic Initial, 97763 Orthotic/Prosthetic subsequent, passive range of motion, balance  training, functional mobility training, energy conservation, coping strategies training, patient/family education, and DME and/or AE instructions  RECOMMENDED OTHER SERVICES: PT for balance  CONSULTED AND AGREED WITH PLAN OF CARE: Patient  PLAN FOR NEXT SESSION:  Assess sensation, and hand function skills at the next visit, as well administer the MAM-20 outcome measure; treatment  Richardson Otter, MS, OTR/L  06/27/2024, 1:20 PM

## 2024-06-28 ENCOUNTER — Ambulatory Visit: Admitting: Occupational Therapy

## 2024-06-28 DIAGNOSIS — M6281 Muscle weakness (generalized): Secondary | ICD-10-CM | POA: Diagnosis not present

## 2024-06-28 NOTE — Therapy (Addendum)
 Occupational Therapy Treatment Note   Patient Name: Maria Lowe MRN: [id number] DOB:1930-12-05, 89 y.o., female Today's Date: 06/28/2024   REFERRING PROVIDER: Katrinka Senior, MD  END OF SESSION:   OT End of Session - 06/28/24 1137     Visit Number 11    Number of Visits 24    Date for Recertification  08/17/24    OT Start Time 1015    OT Stop Time 1100    OT Time Calculation (min) 45 min    Activity Tolerance Patient tolerated treatment well    Behavior During Therapy WFL for tasks assessed/performed          Past Medical History:  Diagnosis Date   Anticoagulant long-term use    eliquis -- managed by cardiology   Arthritis    knees   CKD (chronic kidney disease), stage III (HCC)    CLL (chronic lymphocytic leukemia) (HCC) 11/27/2013   oncologist-- dr lonn;  initial dx 03/ 2015, no treatment, active survillance   Essential hypertension    followed by pcp   Full dentures    History of gastric ulcer    stomach ulcer when young   Lymphocytosis 2009   followed oncology   Permanent atrial fibrillation (HCC) 11/2020   cardiologist-- primary dr w. barbaraann and followed by atrial clinic;    dx 003/ 2022, atenolol  for rate control and on eliquis    Rectocele    Thrombocythemia 2009   related to CLL, followed by dr lonn   Urinary incontinence    Vaginal atrophy    Past Surgical History:  Procedure Laterality Date   APPENDECTOMY     1980s   CATARACT EXTRACTION W/ INTRAOCULAR LENS IMPLANT Bilateral 2020   RECTOCELE REPAIR N/A 08/25/2021   Procedure: POSTERIOR REPAIR (RECTOCELE);  Surgeon: Jertson, Jill Evelyn, MD;  Location: [facility];  Service: Gynecology;  Laterality: N/A;   TONSILLECTOMY AND ADENOIDECTOMY     age 24   Patient Active Problem List   Diagnosis Date Noted   Severe mitral regurgitation 05/23/2024   Deficiency anemia 04/21/2024   Dehydration 04/21/2024   Acute left-sided low back pain with left-sided sciatica 06/25/2022   H/O  rectocele repair 08/25/2021   Secondary hypercoagulable state 12/10/2020   Persistent atrial fibrillation (HCC) 11/29/2020   Hyperlipidemia, unspecified 04/07/2018   CKD (chronic kidney disease), stage III (HCC) 02/09/2018   Rash 02/09/2018   Seborrheic dermatitis 01/08/2017   Former smoker 09/30/2015   Rectocele 03/29/2015   Thrombocytopenia 11/26/2014   CLL (chronic lymphocytic leukemia) (HCC) 11/27/2013   Hyperglycemia 05/08/2013   Essential hypertension, benign 11/07/2012   Obesity, unspecified 11/07/2012    ONSET DATE: 01/2024  REFERRING DIAG: Right hand weakness  THERAPY DIAG:  Muscle weakness (generalized)  Rationale for Evaluation and Treatment: Rehabilitation  SUBJECTIVE:   Pt. reports that she has been cross this morning, and today.  SUBJECTIVE STATEMENT:  Pt. reports no pain today. Pt accompanied by: self; with daughter in the reception area  PERTINENT HISTORY:  Pt. was referred for outpatient OT services for right hand pain. Pt. had received a cat bite approximately one week prior to her going on a trip to [place]. She was hospitalized in [place] for one month due to developing a Cat Bite Pasturella Infection. While in the hospital, Pt. reports that she was placed in an arm cast. Pt. reports that while she was in the hospital she was bedbound, was not allowed to get out of bed for the entire month, and became weak,  and deconditioned. Pt. Reports that she does not know why she was placed in a cast. [place] return to the [place] Pt. started home health OT and PT services, however OT services were limited due to staffing constraints. Pt. was then referred to outpatient OT services. PMHx includes: Severe mitral valve regurgitation, pericadrial, and pleural effusions, A-Fib, Chronic Lymphocytic Anemia, multifactorial anemia and CKD stage III, Diverticulosis, constipation, HTN, Hyperlipidemia, Prediabetes.  PRECAUTIONS: None  WEIGHT BEARING RESTRICTIONS: No  PAIN:  Are you  having pain? No reports of pain today  FALLS: Has patient fallen in last 6 months? 1 fall in the last 6 months  LIVING ENVIRONMENT: Lives with: Resides with her daughter Lives in: House- one level Stairs: ramped entrance Has following equipment at home: Counselling psychologist, Environmental consultant - 4 wheeled, shower chair, bed side commode, and Grab bars, hospital bed, elevated toilet  PLOF: Independent, living alone, managing a household-per report  PATIENT GOALS:  To be able to cut her meat  OBJECTIVE:  Note: Objective measures were completed at Evaluation unless otherwise noted.  HAND DOMINANCE: Right  ADLs:  Transfers/ambulation related to ADLs: Supervision with a rollator walker Eating: Pt. Has difficulty with using utensils with the right hand, often bringing her head down to meet hand/utensil when eating. Pt. Has  weakness with bringing the cup to her mouth. Difficulty with cutting food. Grooming: Difficulty sustaining the BUE's in up long enough to perform hair care. Independent with oral care,  UB Dressing: Independent, requires increased time to complete. Pt. Does endorse having difficulty reaching up to put a shirt on over her head. LB Dressing:Reports being able to perform LE dressing, Uses slide-on footwear.  Toileting:Pt. Reports independently being able to perform toileting care needs using her left hand. (Of note, Pt. Reports that she has always used her left hand to perform toilet hygiene care.) Bathing: Independent, daughter provides supervision from outside the shower curtain. Daughter assists with drying her back due to limited reach Tub Shower transfers: Daughter provides supervision tub/shower t/f's   IADLs: Light housekeeping: Pt. reports that she has resumed some light housekeeping tasks, however this has been limited. Pt. Reports that she does not vacuum, or sweep due to rollator use. Meal Prep: Pt. Has resumed weekly cooking on Sundays. Reports having difficulty cutting  onions, as well as difficulty holding and using a beater. Pt. Reports that she uses a modified technique to hold kitchen/cooking utensils. Community mobility: Pt. Relies on family  and friends for transportation Medication management: Pt. Has difficulty manipulating medication efficiently with the right hand Handwriting: 50% legible for name only Leisure/Hobbies: Loves to cook; enjoys reading , and working crossword puzzles.  MOBILITY STATUS:  Supervision using a rollator  FUNCTIONAL OUTCOME MEASURES: 05/30/24: MAM-20: 11/20 items were completed. The following items Pt. indicated as a 2 -very hard to do: wringing out a towel, opening a wide mouth bottle-previously opened, cutting meat on a plate, tying shoe laces, zipping a jacket, and squeezing toothpaste on a toothbrush. The following items Pt. indicated as a 1-cannot do: opening a medication bottle with a childproof cap, cutting nails with a clipper, buttoning clothing, picking up a 1/2 full water pitcher, and writing 3-4 lines legibly.  UPPER EXTREMITY ROM:     ROM Right eval Right 06/27/24 Left eval Left 06/27/24  Shoulder flexion 130(150) Scaption 134(150)  100 scaption  Shoulder abduction 82(120) 109(120) 72(102) 892(887)  Shoulder adduction      Shoulder extension      Shoulder internal rotation  Shoulder external rotation      Elbow flexion [facility] WFL 148 WFL  Elbow extension [facility] WFL -60(-28) -35(-18)  Wrist flexion 26(60) 30(60) WFL WFL  Wrist extension 44(62) 46(62) WFL WFL  Wrist ulnar deviation      Wrist radial deviation      Wrist pronation      Wrist supination      (Blank rows = not tested)  Eval: Digit flexion to the [facility]:    Right: 2nd: 5cm(2cm) 3rd: 0cm  4th: 0cm 5th: 5cm(3cm)     Left: 2nd: 1cm(1cm) 3rd: 2cm(0cm) 4th: 4cm(0cm) 5th: 5cm(3cm)    06/27/24:  Digit flexion to the [facility]:    Right: 2nd: 4.5cm(2cm) 3rd: 2.5cm(0cm)  4th: 2cm (0cm) 5th: 5cm(2cm)     Left: 2nd: 1cm(1cm) 3rd: 1.4m(1cm) 4th: 1.5cm(1cm)  5th: 5cm(1.5cm)  UPPER EXTREMITY MMT:     MMT Right eval Right  06/27/24 Left eval Left 06/27/24  Shoulder flexion  3+/5  3-/5  Shoulder abduction 3-/5 3+/5 3-/5 3+/5  Shoulder adduction      Shoulder extension      Shoulder internal rotation      Shoulder external rotation      Middle trapezius      Lower trapezius      Elbow flexion 4/5 4/5 4/5 4/5  Elbow extension 4/5 4/5 2+/5 3-/5  Wrist flexion 3-/5 3-/5 4-/5 4-/5  Wrist extension 3-/5 3/5 4-/5 4-/5  Wrist ulnar deviation      Wrist radial deviation      Wrist pronation      Wrist supination      (Blank rows = not tested)  HAND FUNCTION: 05/30/24: Grip strength: Right: 8 lbs; Left: 12 lbs, Lateral pinch: Right: 4 lbs, Left: 3 lbs, and 3 point pinch: Right: 3 lbs, Left: 3 lbs  06/27/24: Grip strength: Right: 11 lbs; Left: 12 lbs, Lateral pinch: Right: 4 lbs, Left: 3 lbs, and 3 point pinch: Right: 3 lbs, Left: 3 lbs  COORDINATION:  COORDINATION: Eval:   9 Hole Peg test: Right: 48 sec; Left: 49 sec  06/27/24:  9 Hole Peg test: Right: 33 sec; Left: 49 sec  SENSATION: TBD   EDEMA: N/A    COGNITION: Overall cognitive status: Within functional limits for tasks assessed  VISION: Subjective report: No change from baseline  PERCEPTION: WFL                                                                                                                TREATMENT DATE: 06/28/24  Manual Therapy:   -Pt. tolerated STM to the bilateral 5th digit dorsal, volar, medial, and lateral surfaces. Manual therapy was performed independent of, and in preparation for therapeutic Ex.    Therapeutic Ex.:   -Pt. tolerated AROM/AAROM/PROM for bilateral 5th digit MP, PIP, and DIP flexion. -Pt. tolerated on BUE strengthening, and reciprocal motion using the UBE while seated for 3 min. with minimal resistance. In forward and reverse motion. Constant monitoring was provided.   Supine: -Bilateral shoulder stabilization exercises  with the shoulder flexed in 90 with the elbow in extension holding with perturbations, scapular protraction reps, and performed circular motion. -chest press with 1.5# dowel 2 sets 5 reps with tapering support Sitting: -Bilateral elbow flexion, and extension with yellow theraband 1-2 sets 10 reps each.    PATIENT EDUCATION: Education details:  functional reaching, digit ROM, and translatory movements. Person educated: Patient Education method: Explanation, Demonstration, Tactile cues, and Verbal cues Education comprehension: verbalized understanding, returned demonstration, verbal cues required, tactile cues required, and needs further education  HOME EXERCISE PROGRAM:    -Bilateral elbow flexion, and extension with yellow theraband.  -Yellow theraputty strengthening  following a HEP through [employer]. -contrasting heat 3 min., followed by 1 min. Cold  for 1 min. Ending with heat for 3 min. To the right hand. (Pt. Prefers just heat at home), moist heat to the left hand. -Massage to the right hand  -Right hand digit extension with the hand flat at the tabletop surface -Blue resistive foam block for Bilateral gross gripping, lateral pinch, and 3pt. Pinch strength -UE shoulder stabilization in supine with the shoulder flexed to 90 degrees with the elbow extended-formulating the alphabet    GOALS: Goals reviewed with patient? Yes  SHORT TERM GOALS: Target date: 07/07/2024     Pt. Will be independent with HEPs for UE strength, and coordination Baseline: 06/27/24: Independent; continue Eval: No current HEP Goal status: Ongoing   LONG TERM GOALS: Target date: 08/18/2024   To reduce pain by 3 grades on the pain scale in preparation for functional hand use during ADLs/IADLs Baseline: 06/27/24: 0/10 pain in the right hand. Pt. reports discomfort intermittently with left 5th digit flexion. Eval:  5/10 pain in the right hand Goal status: Progressed; Achieved  2.  Pt. Will formulate a  full composite fist in preparation for securely holding kitchen/cooking utensils. Baseline:06/27/24: Digit flexion to the [facility]: Right: 2nd: 4.5cm(2cm) 3rd: 2.5cm(0cm)  4th: 2cm (0cm) 5th: 5cm(2cm) Left: 2nd: 1cm(1cm) 3rd: 1.80m(1cm) 4th: 1.5cm(1cm) 5th: 5cm(1.5cm) Pt. Had difficulty with holding and using utensils during self-feeding, Pt. Is improving with larger cooking utensilsEval: Digit flexion to the [facility]:Mphyu: 2nd: 5cm(2cm) 3rd: 0cm  4th: 0cm 5th: 5cm(3cm) Left: 2nd: 1cm(1cm) 3rd: 2cm(0cm) 4th: 4cm(0cm) 5th: 5cm(3cm) Pt, has difficulty securely holding cooking utensils in her right hand Goal status: Progressing, Ongoing  3.  Pt. Will efficiently perform hand to mouth patterns during self-feeding with modified independence using her right hand. Baseline: 06/27/24: Pt. continues to have difficulty bringing her spoon all the way to her mouth, often needing to bring her mouth down to her spoon. Eval: Pt. Has difficulty completing hand to mouth patterns, often bringing her mouth down to meet the spoon 2/2 weakness in the RUE/hand Goal status: Progressing, Ongoing  4.  Pt. Will cut meat on her plate efficiently with modified independence Baseline: 06/27/24: Pt. Is improving with cutting meat, over continues to have difficulty cutting meat. Eval: Pt. Has difficulty cutting meat Goal status:  Progressing, Ongoing   5.  Pt. Will increase bilateral shoulder abduction by 10 degrees be able to efficiently perform hair care Baseline: 06/27/24: Shoulder abduction: Right: 109(120), Left: 892(887) pt. Is able to reach up further to her head, however has difficulty sustaining BUE in elevation while performing hair care. Eval: shoulder abduction: Right: 82(120), Left: 72(102) Goal status: Progressing  6.  Pt. Will write one sentence efficiently  with 75% in preparation for being able to fill in crossword puzzles. Baseline: 06/27/24: Name only with 90% legibility; one  sentence completed with 50% legibility Eval:  writing legibility: 75% for name only. Goal status: INITIAL  ASSESSMENT:  CLINICAL IMPRESSION:  Pt. reports that she has consistently been working with her hands at home. Pt. tolerated stretches for bilateral 5th digit MP, PIP, and DIP flexion without reports of pain. Pt. tolerated minimal UE strengthening with the UBE with rest breaks. Pt. was able to tolerate increased resistance with the shoulder stabilization exercises. Pt. tolerated yellow level resistive theraband for elbow flexion, and extension with verbal cues, and visual demonstration. A visual handout was provided through [employer]. Pt. continues benefit from OT services to work on improving right hand pain, and to improve overall  BUE, functioning in order to increase engagement of the UEs during ADLs, and IADL tasks.      PERFORMANCE DEFICITS: in functional skills including ADLs, IADLs, coordination, dexterity, ROM, strength, pain, Fine motor control, Gross motor control, and UE functional use, , and psychosocial skills including coping strategies, environmental adaptation, habits, interpersonal interactions, and routines and behaviors.   IMPAIRMENTS: are limiting patient from ADLs, IADLs, and leisure.   CO-MORBIDITIES: may have co-morbidities  that affects occupational performance. Patient will benefit from skilled OT to address above impairments and improve overall function.  MODIFICATION OR ASSISTANCE TO COMPLETE EVALUATION: Min-Moderate modification of tasks or assist with assess necessary to complete an evaluation.  OT OCCUPATIONAL PROFILE AND HISTORY: Detailed assessment: Review of records and additional review of physical, cognitive, psychosocial history related to current functional performance.  CLINICAL DECISION MAKING: Moderate - several treatment options, min-mod task modification necessary  REHAB POTENTIAL: Good  EVALUATION COMPLEXITY: Moderate   PLAN:  OT FREQUENCY: 2x/week  OT DURATION: 12 weeks  PLANNED  INTERVENTIONS: 97168 OT Re-evaluation, 97535 self care/ADL training, 02889 therapeutic exercise, 97530 therapeutic activity, 97112 neuromuscular re-education, 97140 manual therapy, 97018 paraffin, 02989 moist heat, 97010 cryotherapy, 97034 contrast bath, 97760 Orthotic Initial, 97763 Orthotic/Prosthetic subsequent, passive range of motion, balance training, functional mobility training, energy conservation, coping strategies training, patient/family education, and DME and/or AE instructions  RECOMMENDED OTHER SERVICES: PT for balance  CONSULTED AND AGREED WITH PLAN OF CARE: Patient  PLAN FOR NEXT SESSION:  Assess sensation, and hand function skills at the next visit, as well administer the MAM-20 outcome measure; treatment  Richardson Otter, MS, OTR/L  06/28/2024, 11:38 AM

## 2024-07-03 ENCOUNTER — Ambulatory Visit: Admitting: Occupational Therapy

## 2024-07-03 DIAGNOSIS — M6281 Muscle weakness (generalized): Secondary | ICD-10-CM | POA: Diagnosis not present

## 2024-07-03 DIAGNOSIS — R278 Other lack of coordination: Secondary | ICD-10-CM

## 2024-07-03 NOTE — Therapy (Signed)
 Occupational Therapy Treatment Note   Patient Name: Maria Lowe MRN: [id number] DOB:08-Jun-1931, 89 y.o., female Today's Date: 07/03/2024   REFERRING PROVIDER: Katrinka Senior, MD  END OF SESSION:   OT End of Session - 07/03/24 1452     Visit Number 12    Number of Visits 24    Date for Recertification  08/17/24    OT Start Time 0930    OT Stop Time 1015    OT Time Calculation (min) 45 min    Activity Tolerance Patient tolerated treatment well    Behavior During Therapy WFL for tasks assessed/performed          Past Medical History:  Diagnosis Date   Anticoagulant long-term use    eliquis -- managed by cardiology   Arthritis    knees   CKD (chronic kidney disease), stage III (HCC)    CLL (chronic lymphocytic leukemia) (HCC) 11/27/2013   oncologist-- dr lonn;  initial dx 03/ 2015, no treatment, active survillance   Essential hypertension    followed by pcp   Full dentures    History of gastric ulcer    stomach ulcer when young   Lymphocytosis 2009   followed oncology   Permanent atrial fibrillation (HCC) 11/2020   cardiologist-- primary dr w. barbaraann and followed by atrial clinic;    dx 003/ 2022, atenolol  for rate control and on eliquis    Rectocele    Thrombocythemia 2009   related to CLL, followed by dr lonn   Urinary incontinence    Vaginal atrophy    Past Surgical History:  Procedure Laterality Date   APPENDECTOMY     1980s   CATARACT EXTRACTION W/ INTRAOCULAR LENS IMPLANT Bilateral 2020   RECTOCELE REPAIR N/A 08/25/2021   Procedure: POSTERIOR REPAIR (RECTOCELE);  Surgeon: Jertson, Jill Evelyn, MD;  Location: [facility];  Service: Gynecology;  Laterality: N/A;   TONSILLECTOMY AND ADENOIDECTOMY     age 74   Patient Active Problem List   Diagnosis Date Noted   Severe mitral regurgitation 05/23/2024   Deficiency anemia 04/21/2024   Dehydration 04/21/2024   Acute left-sided low back pain with left-sided sciatica 06/25/2022   H/O  rectocele repair 08/25/2021   Secondary hypercoagulable state 12/10/2020   Persistent atrial fibrillation (HCC) 11/29/2020   Hyperlipidemia, unspecified 04/07/2018   CKD (chronic kidney disease), stage III (HCC) 02/09/2018   Rash 02/09/2018   Seborrheic dermatitis 01/08/2017   Former smoker 09/30/2015   Rectocele 03/29/2015   Thrombocytopenia 11/26/2014   CLL (chronic lymphocytic leukemia) (HCC) 11/27/2013   Hyperglycemia 05/08/2013   Essential hypertension, benign 11/07/2012   Obesity, unspecified 11/07/2012    ONSET DATE: 01/2024  REFERRING DIAG: Right hand weakness  THERAPY DIAG:  Muscle weakness (generalized)  Other lack of coordination  Rationale for Evaluation and Treatment: Rehabilitation  SUBJECTIVE:   Pt. reports that she has is feeling better today.  SUBJECTIVE STATEMENT:  Pt. reports no pain today. Pt accompanied by: self; with daughter in the reception area  PERTINENT HISTORY:  Pt. was referred for outpatient OT services for right hand pain. Pt. had received a cat bite approximately one week prior to her going on a trip to [place]. She was hospitalized in [place] for one month due to developing a Cat Bite Pasturella Infection. While in the hospital, Pt. reports that she was placed in an arm cast. Pt. reports that while she was in the hospital she was bedbound, was not allowed to get out of bed for the entire month,  and became weak, and deconditioned. Pt. Reports that she does not know why she was placed in a cast. [place] return to the [place] Pt. started home health OT and PT services, however OT services were limited due to staffing constraints. Pt. was then referred to outpatient OT services. PMHx includes: Severe mitral valve regurgitation, pericadrial, and pleural effusions, A-Fib, Chronic Lymphocytic Anemia, multifactorial anemia and CKD stage III, Diverticulosis, constipation, HTN, Hyperlipidemia, Prediabetes.  PRECAUTIONS: None  WEIGHT BEARING RESTRICTIONS:  No  PAIN:  Are you having pain? No reports of pain today  FALLS: Has patient fallen in last 6 months? 1 fall in the last 6 months  LIVING ENVIRONMENT: Lives with: Resides with her daughter Lives in: House- one level Stairs: ramped entrance Has following equipment at home: Counselling psychologist, Environmental consultant - 4 wheeled, shower chair, bed side commode, and Grab bars, hospital bed, elevated toilet  PLOF: Independent, living alone, managing a household-per report  PATIENT GOALS:  To be able to cut her meat  OBJECTIVE:  Note: Objective measures were completed at Evaluation unless otherwise noted.  HAND DOMINANCE: Right  ADLs:  Transfers/ambulation related to ADLs: Supervision with a rollator walker Eating: Pt. Has difficulty with using utensils with the right hand, often bringing her head down to meet hand/utensil when eating. Pt. Has  weakness with bringing the cup to her mouth. Difficulty with cutting food. Grooming: Difficulty sustaining the BUE's in up long enough to perform hair care. Independent with oral care,  UB Dressing: Independent, requires increased time to complete. Pt. Does endorse having difficulty reaching up to put a shirt on over her head. LB Dressing:Reports being able to perform LE dressing, Uses slide-on footwear.  Toileting:Pt. Reports independently being able to perform toileting care needs using her left hand. (Of note, Pt. Reports that she has always used her left hand to perform toilet hygiene care.) Bathing: Independent, daughter provides supervision from outside the shower curtain. Daughter assists with drying her back due to limited reach Tub Shower transfers: Daughter provides supervision tub/shower t/f's   IADLs: Light housekeeping: Pt. reports that she has resumed some light housekeeping tasks, however this has been limited. Pt. Reports that she does not vacuum, or sweep due to rollator use. Meal Prep: Pt. Has resumed weekly cooking on Sundays. Reports having  difficulty cutting onions, as well as difficulty holding and using a beater. Pt. Reports that she uses a modified technique to hold kitchen/cooking utensils. Community mobility: Pt. Relies on family  and friends for transportation Medication management: Pt. Has difficulty manipulating medication efficiently with the right hand Handwriting: 50% legible for name only Leisure/Hobbies: Loves to cook; enjoys reading , and working crossword puzzles.  MOBILITY STATUS:  Supervision using a rollator  FUNCTIONAL OUTCOME MEASURES: 05/30/24: MAM-20: 11/20 items were completed. The following items Pt. indicated as a 2 -very hard to do: wringing out a towel, opening a wide mouth bottle-previously opened, cutting meat on a plate, tying shoe laces, zipping a jacket, and squeezing toothpaste on a toothbrush. The following items Pt. indicated as a 1-cannot do: opening a medication bottle with a childproof cap, cutting nails with a clipper, buttoning clothing, picking up a 1/2 full water pitcher, and writing 3-4 lines legibly.  UPPER EXTREMITY ROM:     ROM Right eval Right 06/27/24 Left eval Left 06/27/24  Shoulder flexion 130(150) Scaption 134(150)  100 scaption  Shoulder abduction 82(120) 109(120) 72(102) 892(887)  Shoulder adduction      Shoulder extension  Shoulder internal rotation      Shoulder external rotation      Elbow flexion [facility] WFL 148 WFL  Elbow extension [facility] WFL -60(-28) -35(-18)  Wrist flexion 26(60) 30(60) WFL WFL  Wrist extension 44(62) 46(62) WFL WFL  Wrist ulnar deviation      Wrist radial deviation      Wrist pronation      Wrist supination      (Blank rows = not tested)  Eval: Digit flexion to the [facility]:    Right: 2nd: 5cm(2cm) 3rd: 0cm  4th: 0cm 5th: 5cm(3cm)     Left: 2nd: 1cm(1cm) 3rd: 2cm(0cm) 4th: 4cm(0cm) 5th: 5cm(3cm)    06/27/24:  Digit flexion to the [facility]:    Right: 2nd: 4.5cm(2cm) 3rd: 2.5cm(0cm)  4th: 2cm (0cm) 5th: 5cm(2cm)     Left: 2nd: 1cm(1cm) 3rd:  1.52m(1cm) 4th: 1.5cm(1cm) 5th: 5cm(1.5cm)  UPPER EXTREMITY MMT:     MMT Right eval Right  06/27/24 Left eval Left 06/27/24  Shoulder flexion  3+/5  3-/5  Shoulder abduction 3-/5 3+/5 3-/5 3+/5  Shoulder adduction      Shoulder extension      Shoulder internal rotation      Shoulder external rotation      Middle trapezius      Lower trapezius      Elbow flexion 4/5 4/5 4/5 4/5  Elbow extension 4/5 4/5 2+/5 3-/5  Wrist flexion 3-/5 3-/5 4-/5 4-/5  Wrist extension 3-/5 3/5 4-/5 4-/5  Wrist ulnar deviation      Wrist radial deviation      Wrist pronation      Wrist supination      (Blank rows = not tested)  HAND FUNCTION: 05/30/24: Grip strength: Right: 8 lbs; Left: 12 lbs, Lateral pinch: Right: 4 lbs, Left: 3 lbs, and 3 point pinch: Right: 3 lbs, Left: 3 lbs  06/27/24: Grip strength: Right: 11 lbs; Left: 12 lbs, Lateral pinch: Right: 4 lbs, Left: 3 lbs, and 3 point pinch: Right: 3 lbs, Left: 3 lbs  COORDINATION:  COORDINATION: Eval:   9 Hole Peg test: Right: 48 sec; Left: 49 sec  06/27/24:  9 Hole Peg test: Right: 33 sec; Left: 49 sec  SENSATION: TBD   EDEMA: N/A    COGNITION: Overall cognitive status: Within functional limits for tasks assessed  VISION: Subjective report: No change from baseline  PERCEPTION: WFL                                                                                                                TREATMENT DATE: 07/03/24  Therapeutic Ex.:   -Pt. tolerated AROM/AAROM/PROM for bilateral 5th digit MP, PIP, and DIP flexion to prepare for formulating a composite fist.  Therapeutic Activities:  -Facilitated bilateral lateral, and 3pt. pinch strengthening using yellow, red, and green level resistive clips combined with reaching out to place them onto a horizontal dowel.  Neuromuscular re-education:   -Facilitated right hand FMC tasks using the Grooved pegboard, grasping and 1 grooved pegs from a horizontal position  in the shallow  dish, and transitioning them to a vertical position in preparation for placing them upright into the pegboard.   PATIENT EDUCATION: Education details:  functional reaching, digit ROM, and translatory movements. Person educated: Patient Education method: Explanation, Demonstration, Tactile cues, and Verbal cues Education comprehension: verbalized understanding, returned demonstration, verbal cues required, tactile cues required, and needs further education  HOME EXERCISE PROGRAM:    -Bilateral elbow flexion, and extension with yellow theraband.  -Yellow theraputty strengthening  following a HEP through [employer]. -contrasting heat 3 min., followed by 1 min. Cold  for 1 min. Ending with heat for 3 min. To the right hand. (Pt. Prefers just heat at home), moist heat to the left hand. -Massage to the right hand  -Right hand digit extension with the hand flat at the tabletop surface -Blue resistive foam block for Bilateral gross gripping, lateral pinch, and 3pt. Pinch strength -UE shoulder stabilization in supine with the shoulder flexed to 90 degrees with the elbow extended-formulating the alphabet    GOALS: Goals reviewed with patient? Yes  SHORT TERM GOALS: Target date: 07/07/2024     Pt. Will be independent with HEPs for UE strength, and coordination Baseline: 06/27/24: Independent; continue Eval: No current HEP Goal status: Ongoing   LONG TERM GOALS: Target date: 08/18/2024   To reduce pain by 3 grades on the pain scale in preparation for functional hand use during ADLs/IADLs Baseline: 06/27/24: 0/10 pain in the right hand. Pt. reports discomfort intermittently with left 5th digit flexion. Eval:  5/10 pain in the right hand Goal status: Progressed; Achieved  2.  Pt. Will formulate a full composite fist in preparation for securely holding kitchen/cooking utensils. Baseline:06/27/24: Digit flexion to the [facility]: Right: 2nd: 4.5cm(2cm) 3rd: 2.5cm(0cm)  4th: 2cm (0cm) 5th: 5cm(2cm) Left:  2nd: 1cm(1cm) 3rd: 1.88m(1cm) 4th: 1.5cm(1cm) 5th: 5cm(1.5cm) Pt. Had difficulty with holding and using utensils during self-feeding, Pt. Is improving with larger cooking utensilsEval: Digit flexion to the [facility]:Mphyu: 2nd: 5cm(2cm) 3rd: 0cm  4th: 0cm 5th: 5cm(3cm) Left: 2nd: 1cm(1cm) 3rd: 2cm(0cm) 4th: 4cm(0cm) 5th: 5cm(3cm) Pt, has difficulty securely holding cooking utensils in her right hand Goal status: Progressing, Ongoing  3.  Pt. Will efficiently perform hand to mouth patterns during self-feeding with modified independence using her right hand. Baseline: 06/27/24: Pt. continues to have difficulty bringing her spoon all the way to her mouth, often needing to bring her mouth down to her spoon. Eval: Pt. Has difficulty completing hand to mouth patterns, often bringing her mouth down to meet the spoon 2/2 weakness in the RUE/hand Goal status: Progressing, Ongoing  4.  Pt. Will cut meat on her plate efficiently with modified independence Baseline: 06/27/24: Pt. Is improving with cutting meat, over continues to have difficulty cutting meat. Eval: Pt. Has difficulty cutting meat Goal status:  Progressing, Ongoing   5.  Pt. Will increase bilateral shoulder abduction by 10 degrees be able to efficiently perform hair care Baseline: 06/27/24: Shoulder abduction: Right: 109(120), Left: 892(887) pt. Is able to reach up further to her head, however has difficulty sustaining BUE in elevation while performing hair care. Eval: shoulder abduction: Right: 82(120), Left: 72(102) Goal status: Progressing  6.  Pt. Will write one sentence efficiently  with 75% in preparation for being able to fill in crossword puzzles. Baseline: 06/27/24: Name only with 90% legibility; one sentence completed with 50% legibility Eval: writing legibility: 75% for name only. Goal status: INITIAL  ASSESSMENT:  CLINICAL IMPRESSION:  Pt. reports having had right shoulder  pain over the weekend, and reports that using heat had helped.  Pt. presents with no pain this morning. Pt. continues to have difficulty formulating full composite fists 2/2 to limited bilateral 5th digit MP, PIP, and DIP flexion. Pt. Continues to present with limited BUE weakness, and limited right hand [facility] skills. Pt. Continues to have difficulty feeding herself. Pt. continues benefit from OT services to work on improving right hand pain, and to improve overall  BUE, functioning in order to increase engagement of the UEs during ADLs, and IADL tasks.      PERFORMANCE DEFICITS: in functional skills including ADLs, IADLs, coordination, dexterity, ROM, strength, pain, Fine motor control, Gross motor control, and UE functional use, , and psychosocial skills including coping strategies, environmental adaptation, habits, interpersonal interactions, and routines and behaviors.   IMPAIRMENTS: are limiting patient from ADLs, IADLs, and leisure.   CO-MORBIDITIES: may have co-morbidities  that affects occupational performance. Patient will benefit from skilled OT to address above impairments and improve overall function.  MODIFICATION OR ASSISTANCE TO COMPLETE EVALUATION: Min-Moderate modification of tasks or assist with assess necessary to complete an evaluation.  OT OCCUPATIONAL PROFILE AND HISTORY: Detailed assessment: Review of records and additional review of physical, cognitive, psychosocial history related to current functional performance.  CLINICAL DECISION MAKING: Moderate - several treatment options, min-mod task modification necessary  REHAB POTENTIAL: Good  EVALUATION COMPLEXITY: Moderate   PLAN:  OT FREQUENCY: 2x/week  OT DURATION: 12 weeks  PLANNED INTERVENTIONS: 97168 OT Re-evaluation, 97535 self care/ADL training, 02889 therapeutic exercise, 97530 therapeutic activity, 97112 neuromuscular re-education, 97140 manual therapy, 97018 paraffin, 02989 moist heat, 97010 cryotherapy, 97034 contrast bath, 97760 Orthotic Initial, 97763 Orthotic/Prosthetic  subsequent, passive range of motion, balance training, functional mobility training, energy conservation, coping strategies training, patient/family education, and DME and/or AE instructions  RECOMMENDED OTHER SERVICES: PT for balance  CONSULTED AND AGREED WITH PLAN OF CARE: Patient  PLAN FOR NEXT SESSION:  Assess sensation, and hand function skills at the next visit, as well administer the MAM-20 outcome measure; treatment  Richardson Otter, MS, OTR/L  07/03/2024, 2:55 PM

## 2024-07-05 ENCOUNTER — Ambulatory Visit: Admitting: Occupational Therapy

## 2024-07-05 DIAGNOSIS — M6281 Muscle weakness (generalized): Secondary | ICD-10-CM | POA: Diagnosis not present

## 2024-07-05 NOTE — Therapy (Signed)
 Occupational Therapy Treatment Note   Patient Name: Maria Lowe MRN: [id number] DOB:June 17, 1931, 89 y.o., female Today's Date: 07/05/2024   REFERRING PROVIDER: Katrinka Senior, MD  END OF SESSION:   OT End of Session - 07/05/24 1018     Visit Number 13    Number of Visits 24    Date for Recertification  08/17/24    OT Start Time 0930    OT Stop Time 1015    OT Time Calculation (min) 45 min    Activity Tolerance Patient tolerated treatment well    Behavior During Therapy WFL for tasks assessed/performed          Past Medical History:  Diagnosis Date   Anticoagulant long-term use    eliquis -- managed by cardiology   Arthritis    knees   CKD (chronic kidney disease), stage III (HCC)    CLL (chronic lymphocytic leukemia) (HCC) 11/27/2013   oncologist-- dr lonn;  initial dx 03/ 2015, no treatment, active survillance   Essential hypertension    followed by pcp   Full dentures    History of gastric ulcer    stomach ulcer when young   Lymphocytosis 2009   followed oncology   Permanent atrial fibrillation (HCC) 11/2020   cardiologist-- primary dr w. barbaraann and followed by atrial clinic;    dx 003/ 2022, atenolol  for rate control and on eliquis    Rectocele    Thrombocythemia 2009   related to CLL, followed by dr lonn   Urinary incontinence    Vaginal atrophy    Past Surgical History:  Procedure Laterality Date   APPENDECTOMY     1980s   CATARACT EXTRACTION W/ INTRAOCULAR LENS IMPLANT Bilateral 2020   RECTOCELE REPAIR N/A 08/25/2021   Procedure: POSTERIOR REPAIR (RECTOCELE);  Surgeon: Jertson, Jill Evelyn, MD;  Location: [facility];  Service: Gynecology;  Laterality: N/A;   TONSILLECTOMY AND ADENOIDECTOMY     age 54   Patient Active Problem List   Diagnosis Date Noted   Severe mitral regurgitation 05/23/2024   Deficiency anemia 04/21/2024   Dehydration 04/21/2024   Acute left-sided low back pain with left-sided sciatica 06/25/2022   H/O  rectocele repair 08/25/2021   Secondary hypercoagulable state 12/10/2020   Persistent atrial fibrillation (HCC) 11/29/2020   Hyperlipidemia, unspecified 04/07/2018   CKD (chronic kidney disease), stage III (HCC) 02/09/2018   Rash 02/09/2018   Seborrheic dermatitis 01/08/2017   Former smoker 09/30/2015   Rectocele 03/29/2015   Thrombocytopenia 11/26/2014   CLL (chronic lymphocytic leukemia) (HCC) 11/27/2013   Hyperglycemia 05/08/2013   Essential hypertension, benign 11/07/2012   Obesity, unspecified 11/07/2012    ONSET DATE: 01/2024  REFERRING DIAG: Right hand weakness  THERAPY DIAG:  Muscle weakness (generalized)  Rationale for Evaluation and Treatment: Rehabilitation  SUBJECTIVE:   Pt. reports that she feels like she may be coming down with a cold.   SUBJECTIVE STATEMENT:  Pt. reports no pain today. Pt accompanied by: self; with daughter in the reception area  PERTINENT HISTORY:  Pt. was referred for outpatient OT services for right hand pain. Pt. had received a cat bite approximately one week prior to her going on a trip to [place]. She was hospitalized in [place] for one month due to developing a Cat Bite Pasturella Infection. While in the hospital, Pt. reports that she was placed in an arm cast. Pt. reports that while she was in the hospital she was bedbound, was not allowed to get out of bed for the entire  month, and became weak, and deconditioned. Pt. Reports that she does not know why she was placed in a cast. [place] return to the [place] Pt. started home health OT and PT services, however OT services were limited due to staffing constraints. Pt. was then referred to outpatient OT services. PMHx includes: Severe mitral valve regurgitation, pericadrial, and pleural effusions, A-Fib, Chronic Lymphocytic Anemia, multifactorial anemia and CKD stage III, Diverticulosis, constipation, HTN, Hyperlipidemia, Prediabetes.  PRECAUTIONS: None  WEIGHT BEARING RESTRICTIONS: No  PAIN:  Are  you having pain? No reports of pain today  FALLS: Has patient fallen in last 6 months? 1 fall in the last 6 months  LIVING ENVIRONMENT: Lives with: Resides with her daughter Lives in: House- one level Stairs: ramped entrance Has following equipment at home: Counselling psychologist, Environmental consultant - 4 wheeled, shower chair, bed side commode, and Grab bars, hospital bed, elevated toilet  PLOF: Independent, living alone, managing a household-per report  PATIENT GOALS:  To be able to cut her meat  OBJECTIVE:  Note: Objective measures were completed at Evaluation unless otherwise noted.  HAND DOMINANCE: Right  ADLs:  Transfers/ambulation related to ADLs: Supervision with a rollator walker Eating: Pt. Has difficulty with using utensils with the right hand, often bringing her head down to meet hand/utensil when eating. Pt. Has  weakness with bringing the cup to her mouth. Difficulty with cutting food. Grooming: Difficulty sustaining the BUE's in up long enough to perform hair care. Independent with oral care,  UB Dressing: Independent, requires increased time to complete. Pt. Does endorse having difficulty reaching up to put a shirt on over her head. LB Dressing:Reports being able to perform LE dressing, Uses slide-on footwear.  Toileting:Pt. Reports independently being able to perform toileting care needs using her left hand. (Of note, Pt. Reports that she has always used her left hand to perform toilet hygiene care.) Bathing: Independent, daughter provides supervision from outside the shower curtain. Daughter assists with drying her back due to limited reach Tub Shower transfers: Daughter provides supervision tub/shower t/f's   IADLs: Light housekeeping: Pt. reports that she has resumed some light housekeeping tasks, however this has been limited. Pt. Reports that she does not vacuum, or sweep due to rollator use. Meal Prep: Pt. Has resumed weekly cooking on Sundays. Reports having difficulty  cutting onions, as well as difficulty holding and using a beater. Pt. Reports that she uses a modified technique to hold kitchen/cooking utensils. Community mobility: Pt. Relies on family  and friends for transportation Medication management: Pt. Has difficulty manipulating medication efficiently with the right hand Handwriting: 50% legible for name only Leisure/Hobbies: Loves to cook; enjoys reading , and working crossword puzzles.  MOBILITY STATUS:  Supervision using a rollator  FUNCTIONAL OUTCOME MEASURES: 05/30/24: MAM-20: 11/20 items were completed. The following items Pt. indicated as a 2 -very hard to do: wringing out a towel, opening a wide mouth bottle-previously opened, cutting meat on a plate, tying shoe laces, zipping a jacket, and squeezing toothpaste on a toothbrush. The following items Pt. indicated as a 1-cannot do: opening a medication bottle with a childproof cap, cutting nails with a clipper, buttoning clothing, picking up a 1/2 full water pitcher, and writing 3-4 lines legibly.  UPPER EXTREMITY ROM:     ROM Right eval Right 06/27/24 Left eval Left 06/27/24  Shoulder flexion 130(150) Scaption 134(150)  100 scaption  Shoulder abduction 82(120) 109(120) 72(102) 892(887)  Shoulder adduction      Shoulder extension  Shoulder internal rotation      Shoulder external rotation      Elbow flexion [facility] WFL 148 WFL  Elbow extension [facility] WFL -60(-28) -35(-18)  Wrist flexion 26(60) 30(60) WFL WFL  Wrist extension 44(62) 46(62) WFL WFL  Wrist ulnar deviation      Wrist radial deviation      Wrist pronation      Wrist supination      (Blank rows = not tested)  Eval: Digit flexion to the [facility]:    Right: 2nd: 5cm(2cm) 3rd: 0cm  4th: 0cm 5th: 5cm(3cm)     Left: 2nd: 1cm(1cm) 3rd: 2cm(0cm) 4th: 4cm(0cm) 5th: 5cm(3cm)    06/27/24:  Digit flexion to the [facility]:    Right: 2nd: 4.5cm(2cm) 3rd: 2.5cm(0cm)  4th: 2cm (0cm) 5th: 5cm(2cm)     Left: 2nd: 1cm(1cm) 3rd: 1.80m(1cm) 4th:  1.5cm(1cm) 5th: 5cm(1.5cm)  UPPER EXTREMITY MMT:     MMT Right eval Right  06/27/24 Left eval Left 06/27/24  Shoulder flexion  3+/5  3-/5  Shoulder abduction 3-/5 3+/5 3-/5 3+/5  Shoulder adduction      Shoulder extension      Shoulder internal rotation      Shoulder external rotation      Middle trapezius      Lower trapezius      Elbow flexion 4/5 4/5 4/5 4/5  Elbow extension 4/5 4/5 2+/5 3-/5  Wrist flexion 3-/5 3-/5 4-/5 4-/5  Wrist extension 3-/5 3/5 4-/5 4-/5  Wrist ulnar deviation      Wrist radial deviation      Wrist pronation      Wrist supination      (Blank rows = not tested)  HAND FUNCTION: 05/30/24: Grip strength: Right: 8 lbs; Left: 12 lbs, Lateral pinch: Right: 4 lbs, Left: 3 lbs, and 3 point pinch: Right: 3 lbs, Left: 3 lbs  06/27/24: Grip strength: Right: 11 lbs; Left: 12 lbs, Lateral pinch: Right: 4 lbs, Left: 3 lbs, and 3 point pinch: Right: 3 lbs, Left: 3 lbs  COORDINATION:  COORDINATION: Eval:   9 Hole Peg test: Right: 48 sec; Left: 49 sec  06/27/24:  9 Hole Peg test: Right: 33 sec; Left: 49 sec  SENSATION: TBD   EDEMA: N/A    COGNITION: Overall cognitive status: Within functional limits for tasks assessed  VISION: Subjective report: No change from baseline  PERCEPTION: WFL                                                                                                                TREATMENT DATE: 07/05/24  Therapeutic Ex.:   -Pt. tolerated AROM/AAROM/PROM for right 2nd digit, and  bilateral 5th digit MP, PIP, and DIP flexion to prepare for formulating a composite fist. -Digit extension reps were performed with the hand positioned flat at the tabletop surface. -Gross grip strengthening using 1 yellow level resistive band. Pt. with the gripper positioned vertically, and horizontally 1 set 10 reps each.  Therapeutic Activities:  -Facilitated bilateral lateral, and 3pt. pinch strengthening using  yellow, and red level resistive  clips combined with placing them onto a horizontal dowel.    PATIENT EDUCATION: Education details: digit ROM, grip, and pinch strengthening Person educated: Patient Education method: Explanation, Demonstration, Tactile cues, and Verbal cues Education comprehension: verbalized understanding, returned demonstration, verbal cues required, tactile cues required, and needs further education  HOME EXERCISE PROGRAM:    -Bilateral elbow flexion, and extension with yellow theraband.  -Yellow theraputty strengthening  following a HEP through [employer]. -contrasting heat 3 min., followed by 1 min. Cold  for 1 min. Ending with heat for 3 min. To the right hand. (Pt. Prefers just heat at home), moist heat to the left hand. -Massage to the right hand  -Right hand digit extension with the hand flat at the tabletop surface -Blue resistive foam block for Bilateral gross gripping, lateral pinch, and 3pt. Pinch strength -UE shoulder stabilization in supine with the shoulder flexed to 90 degrees with the elbow extended-formulating the alphabet    GOALS: Goals reviewed with patient? Yes  SHORT TERM GOALS: Target date: 07/07/2024     Pt. Will be independent with HEPs for UE strength, and coordination Baseline: 06/27/24: Independent; continue Eval: No current HEP Goal status: Ongoing   LONG TERM GOALS: Target date: 08/18/2024   To reduce pain by 3 grades on the pain scale in preparation for functional hand use during ADLs/IADLs Baseline: 06/27/24: 0/10 pain in the right hand. Pt. reports discomfort intermittently with left 5th digit flexion. Eval:  5/10 pain in the right hand Goal status: Progressed; Achieved  2.  Pt. Will formulate a full composite fist in preparation for securely holding kitchen/cooking utensils. Baseline:06/27/24: Digit flexion to the [facility]: Right: 2nd: 4.5cm(2cm) 3rd: 2.5cm(0cm)  4th: 2cm (0cm) 5th: 5cm(2cm) Left: 2nd: 1cm(1cm) 3rd: 1.1m(1cm) 4th: 1.5cm(1cm) 5th: 5cm(1.5cm) Pt. Had  difficulty with holding and using utensils during self-feeding, Pt. Is improving with larger cooking utensilsEval: Digit flexion to the I[facility]:Mphyu: 2nd: 5cm(2cm) 3rd: 0cm  4th: 0cm 5th: 5cm(3cm) Left: 2nd: 1cm(1cm) 3rd: 2cm(0cm) 4th: 4cm(0cm) 5th: 5cm(3cm) Pt, has difficulty securely holding cooking utensils in her right hand Goal status: Progressing, Ongoing  3.  Pt. Will efficiently perform hand to mouth patterns during self-feeding with modified independence using her right hand. Baseline: 06/27/24: Pt. continues to have difficulty bringing her spoon all the way to her mouth, often needing to bring her mouth down to her spoon. Eval: Pt. Has difficulty completing hand to mouth patterns, often bringing her mouth down to meet the spoon 2/2 weakness in the RUE/hand Goal status: Progressing, Ongoing  4.  Pt. Will cut meat on her plate efficiently with modified independence Baseline: 06/27/24: Pt. Is improving with cutting meat, over continues to have difficulty cutting meat. Eval: Pt. Has difficulty cutting meat Goal status:  Progressing, Ongoing   5.  Pt. Will increase bilateral shoulder abduction by 10 degrees be able to efficiently perform hair care Baseline: 06/27/24: Shoulder abduction: Right: 109(120), Left: 892(887) pt. Is able to reach up further to her head, however has difficulty sustaining BUE in elevation while performing hair care. Eval: shoulder abduction: Right: 82(120), Left: 72(102) Goal status: Progressing  6.  Pt. Will write one sentence efficiently  with 75% in preparation for being able to fill in crossword puzzles. Baseline: 06/27/24: Name only with 90% legibility; one sentence completed with 50% legibility Eval: writing legibility: 75% for name only. Goal status: INITIAL  ASSESSMENT:  CLINICAL IMPRESSION:  Pt. reports that she used heat this morning. Pt. reports that her hand  would not work this morning to hold her coffee. AROM for bilateral 5th digits, as well as the 2nd  digit. Pt. tolerated the exercises well today, however requires the position of the resistive clip task to be modified for the RUE 2/2 weakness.  Pt. continues to benefit from OT services to work on improving right hand pain, and to improve overall  BUE, functioning in order to increase engagement of the UEs during ADLs, and IADL tasks.    PERFORMANCE DEFICITS: in functional skills including ADLs, IADLs, coordination, dexterity, ROM, strength, pain, Fine motor control, Gross motor control, and UE functional use, , and psychosocial skills including coping strategies, environmental adaptation, habits, interpersonal interactions, and routines and behaviors.   IMPAIRMENTS: are limiting patient from ADLs, IADLs, and leisure.   CO-MORBIDITIES: may have co-morbidities  that affects occupational performance. Patient will benefit from skilled OT to address above impairments and improve overall function.  MODIFICATION OR ASSISTANCE TO COMPLETE EVALUATION: Min-Moderate modification of tasks or assist with assess necessary to complete an evaluation.  OT OCCUPATIONAL PROFILE AND HISTORY: Detailed assessment: Review of records and additional review of physical, cognitive, psychosocial history related to current functional performance.  CLINICAL DECISION MAKING: Moderate - several treatment options, min-mod task modification necessary  REHAB POTENTIAL: Good  EVALUATION COMPLEXITY: Moderate   PLAN:  OT FREQUENCY: 2x/week  OT DURATION: 12 weeks  PLANNED INTERVENTIONS: 97168 OT Re-evaluation, 97535 self care/ADL training, 02889 therapeutic exercise, 97530 therapeutic activity, 97112 neuromuscular re-education, 97140 manual therapy, 97018 paraffin, 02989 moist heat, 97010 cryotherapy, 97034 contrast bath, 97760 Orthotic Initial, 97763 Orthotic/Prosthetic subsequent, passive range of motion, balance training, functional mobility training, energy conservation, coping strategies training, patient/family  education, and DME and/or AE instructions  RECOMMENDED OTHER SERVICES: PT for balance  CONSULTED AND AGREED WITH PLAN OF CARE: Patient  PLAN FOR NEXT SESSION:  Assess sensation, and hand function skills at the next visit, as well administer the MAM-20 outcome measure; treatment  Richardson Otter, MS, OTR/L  07/05/2024, 10:24 AM

## 2024-07-10 ENCOUNTER — Ambulatory Visit: Admitting: Occupational Therapy

## 2024-07-10 DIAGNOSIS — M6281 Muscle weakness (generalized): Secondary | ICD-10-CM | POA: Diagnosis not present

## 2024-07-10 NOTE — Therapy (Addendum)
 Occupational Therapy Treatment Note   Patient Name: Maria Lowe MRN: [id number] DOB:[birth year], 89 y.o., female Today's Date: 07/10/2024   REFERRING PROVIDER: Katrinka Senior, MD  END OF SESSION:   OT End of Session - 07/10/24 1024     Visit Number 14    Number of Visits 24    Date for Recertification  08/17/24    OT Start Time 0930    OT Stop Time 1015    OT Time Calculation (min) 45 min    Activity Tolerance Patient tolerated treatment well    Behavior During Therapy WFL for tasks assessed/performed          Past Medical History:  Diagnosis Date   Anticoagulant long-term use    eliquis -- managed by cardiology   Arthritis    knees   CKD (chronic kidney disease), stage III (HCC)    CLL (chronic lymphocytic leukemia) (HCC) 11/27/2013   oncologist-- dr Maria Lowe;  initial dx 03/ 2015, no treatment, active survillance   Essential hypertension    followed by pcp   Full dentures    History of gastric ulcer    stomach ulcer when young   Lymphocytosis 2009   followed oncology   Permanent atrial fibrillation (HCC) 11/2020   cardiologist-- primary dr Maria Lowe and followed by atrial clinic;    dx 003/ 2022, atenolol  for rate control and on eliquis    Rectocele    Thrombocythemia 2009   related to CLL, followed by dr Maria Lowe   Urinary incontinence    Vaginal atrophy    Past Surgical History:  Procedure Laterality Date   APPENDECTOMY     1980s   CATARACT EXTRACTION W/ INTRAOCULAR LENS IMPLANT Bilateral 2020   RECTOCELE REPAIR N/A 08/25/2021   Procedure: POSTERIOR REPAIR (RECTOCELE);  Surgeon: Lowe, Maria Evelyn, MD;  Location: [facility];  Service: Gynecology;  Laterality: N/A;   TONSILLECTOMY AND ADENOIDECTOMY     age 50   Patient Active Problem List   Diagnosis Date Noted   Severe mitral regurgitation 05/23/2024   Deficiency anemia 04/21/2024   Dehydration 04/21/2024   Acute left-sided low back pain with left-sided sciatica 06/25/2022   H/O  rectocele repair 08/25/2021   Secondary hypercoagulable state 12/10/2020   Persistent atrial fibrillation (HCC) 11/29/2020   Hyperlipidemia, unspecified 04/07/2018   CKD (chronic kidney disease), stage III (HCC) 02/09/2018   Rash 02/09/2018   Seborrheic dermatitis 01/08/2017   Former smoker 09/30/2015   Rectocele 03/29/2015   Thrombocytopenia 11/26/2014   CLL (chronic lymphocytic leukemia) (HCC) 11/27/2013   Hyperglycemia 05/08/2013   Essential hypertension, benign 11/07/2012   Obesity, unspecified 11/07/2012    ONSET DATE: 01/2024  REFERRING DIAG: Right hand weakness  THERAPY DIAG:  Muscle weakness (generalized)  Rationale for Evaluation and Treatment: Rehabilitation  SUBJECTIVE:   SUBJECTIVE STATEMENT:  Pt. reports that she can't reach up to brush the back of her hair. Pt accompanied by: self; with daughter in the reception area  PERTINENT HISTORY:  Pt. was referred for outpatient OT services for right hand pain. Pt. had received a cat bite approximately one week prior to her going on a trip to [place]. She was hospitalized in [place] for one month due to developing a Cat Bite Pasturella Infection. While in the hospital, Pt. reports that she was placed in an arm cast. Pt. reports that while she was in the hospital she was bedbound, was not allowed to get out of bed for the entire month, and became weak, and deconditioned. Pt.  Reports that she does not know why she was placed in a cast. [place] return [place] the [place] Pt. started home health OT and PT services, however OT services were limited due to staffing constraints. Pt. was then referred to outpatient OT services. PMHx includes: Severe mitral valve regurgitation, pericadrial, and pleural effusions, A-Fib, Chronic Lymphocytic Anemia, multifactorial anemia and CKD stage III, Diverticulosis, constipation, HTN, Hyperlipidemia, Prediabetes.  PRECAUTIONS: None  WEIGHT BEARING RESTRICTIONS: No  PAIN:  Are you having pain? No reports of  pain today  FALLS: Has patient fallen in last 6 months? 1 fall in the last 6 months  LIVING ENVIRONMENT: Lives with: Resides with her daughter Lives in: House- one level Stairs: ramped entrance Has following equipment at home: Counselling psychologist, Environmental consultant - 4 wheeled, shower chair, bed side commode, and Grab bars, hospital bed, elevated toilet  PLOF: Independent, living alone, managing a household-per report  PATIENT GOALS:  To be able to cut her meat  OBJECTIVE:  Note: Objective measures were completed at Evaluation unless otherwise noted.  HAND DOMINANCE: Right  ADLs:  Transfers/ambulation related to ADLs: Supervision with a rollator walker Eating: Pt. Has difficulty with using utensils with the right hand, often bringing her head down to meet hand/utensil when eating. Pt. Has  weakness with bringing the cup to her mouth. Difficulty with cutting food. Grooming: Difficulty sustaining the BUE's in up long enough to perform hair care. Independent with oral care,  UB Dressing: Independent, requires increased time to complete. Pt. Does endorse having difficulty reaching up to put a shirt on over her head. LB Dressing:Reports being able to perform LE dressing, Uses slide-on footwear.  Toileting:Pt. Reports independently being able to perform toileting care needs using her left hand. (Of note, Pt. Reports that she has always used her left hand to perform toilet hygiene care.) Bathing: Independent, daughter provides supervision from outside the shower curtain. Daughter assists with drying her back due to limited reach Tub Shower transfers: Daughter provides supervision tub/shower t/f's   IADLs: Light housekeeping: Pt. reports that she has resumed some light housekeeping tasks, however this has been limited. Pt. Reports that she does not vacuum, or sweep due to rollator use. Meal Prep: Pt. Has resumed weekly cooking on Sundays. Reports having difficulty cutting onions, as well as difficulty  holding and using a beater. Pt. Reports that she uses a modified technique to hold kitchen/cooking utensils. Community mobility: Pt. Relies on family  and friends for transportation Medication management: Pt. Has difficulty manipulating medication efficiently with the right hand Handwriting: 50% legible for name only Leisure/Hobbies: Loves to cook; enjoys reading , and working crossword puzzles.  MOBILITY STATUS:  Supervision using a rollator  FUNCTIONAL OUTCOME MEASURES: 05/30/24: MAM-20: 11/20 items were completed. The following items Pt. indicated as a 2 -very hard to do: wringing out a towel, opening a wide mouth bottle-previously opened, cutting meat on a plate, tying shoe laces, zipping a jacket, and squeezing toothpaste on a toothbrush. The following items Pt. indicated as a 1-cannot do: opening a medication bottle with a childproof cap, cutting nails with a clipper, buttoning clothing, picking up a 1/2 full water pitcher, and writing 3-4 lines legibly.  UPPER EXTREMITY ROM:     ROM Right eval Right 06/27/24 Left eval Left 06/27/24  Shoulder flexion 130(150) Scaption 134(150)  100 scaption  Shoulder abduction 82(120) 109(120) 72(102) 892(887)  Shoulder adduction      Shoulder extension      Shoulder internal rotation  Shoulder external rotation      Elbow flexion [facility] WFL 148 WFL  Elbow extension [facility] WFL -60(-28) -35(-18)  Wrist flexion 26(60) 30(60) WFL WFL  Wrist extension 44(62) 46(62) WFL WFL  Wrist ulnar deviation      Wrist radial deviation      Wrist pronation      Wrist supination      (Blank rows = not tested)  Eval: Digit flexion to the [facility]:    Right: 2nd: 5cm(2cm) 3rd: 0cm  4th: 0cm 5th: 5cm(3cm)     Left: 2nd: 1cm(1cm) 3rd: 2cm(0cm) 4th: 4cm(0cm) 5th: 5cm(3cm)    06/27/24:  Digit flexion to the [facility]:    Right: 2nd: 4.5cm(2cm) 3rd: 2.5cm(0cm)  4th: 2cm (0cm) 5th: 5cm(2cm)     Left: 2nd: 1cm(1cm) 3rd: 1.23m(1cm) 4th: 1.5cm(1cm) 5th: 5cm(1.5cm)  UPPER  EXTREMITY MMT:     MMT Right eval Right  06/27/24 Left eval Left 06/27/24  Shoulder flexion  3+/5  3-/5  Shoulder abduction 3-/5 3+/5 3-/5 3+/5  Shoulder adduction      Shoulder extension      Shoulder internal rotation      Shoulder external rotation      Middle trapezius      Lower trapezius      Elbow flexion 4/5 4/5 4/5 4/5  Elbow extension 4/5 4/5 2+/5 3-/5  Wrist flexion 3-/5 3-/5 4-/5 4-/5  Wrist extension 3-/5 3/5 4-/5 4-/5  Wrist ulnar deviation      Wrist radial deviation      Wrist pronation      Wrist supination      (Blank rows = not tested)  HAND FUNCTION: 05/30/24: Grip strength: Right: 8 lbs; Left: 12 lbs, Lateral pinch: Right: 4 lbs, Left: 3 lbs, and 3 point pinch: Right: 3 lbs, Left: 3 lbs  06/27/24: Grip strength: Right: 11 lbs; Left: 12 lbs, Lateral pinch: Right: 4 lbs, Left: 3 lbs, and 3 point pinch: Right: 3 lbs, Left: 3 lbs  COORDINATION:  COORDINATION: Eval:   9 Hole Peg test: Right: 48 sec; Left: 49 sec  06/27/24:  9 Hole Peg test: Right: 33 sec; Left: 49 sec  SENSATION: TBD   EDEMA: N/A    COGNITION: Overall cognitive status: Within functional limits for tasks assessed  VISION: Subjective report: No change from baseline  PERCEPTION: WFL                                                                                                                TREATMENT DATE: 07/10/24  Therapeutic Ex.:   -Pt. tolerated AROM/AAROM/PROM for right 2nd digit, and bilateral 5th digit MP, PIP, and DIP flexion to prepare for formulating a composite fist. -Digit extension reps were performed with the hand positioned flat at the tabletop surface. -Bilateral gross grip strengthening using 1 red level resistive band. Pt. with the gripper positioned vertically, and horizontally 1 set 10 reps each. Attempted to increase right grip strength with 2 resistive bands, however was unable to tolerate  Therapeutic Activities:  -Facilitated  bilateral lateral, and  3pt. pinch strengthening using yellow, red, and green level resistive clips in combination with placing them onto a horizontal dowel.    PATIENT EDUCATION: Education details: digit ROM, grip, and pinch strengthening Person educated: Patient Education method: Explanation, Demonstration, Tactile cues, and Verbal cues Education comprehension: verbalized understanding, returned demonstration, verbal cues required, tactile cues required, and needs further education  HOME EXERCISE PROGRAM:    -Bilateral elbow flexion, and extension with yellow theraband.  -Yellow theraputty strengthening  following a HEP through [employer]. -contrasting heat 3 min., followed by 1 min. Cold  for 1 min. Ending with heat for 3 min. To the right hand. (Pt. Prefers just heat at home), moist heat to the left hand. -Massage to the right hand  -Right hand digit extension with the hand flat at the tabletop surface -Blue resistive foam block for Bilateral gross gripping, lateral pinch, and 3pt. Pinch strength -UE shoulder stabilization in supine with the shoulder flexed to 90 degrees with the elbow extended-formulating the alphabet    GOALS: Goals reviewed with patient? Yes  SHORT TERM GOALS: Target date: 07/07/2024     Pt. Will be independent with HEPs for UE strength, and coordination Baseline: 06/27/24: Independent; continue Eval: No current HEP Goal status: Ongoing   LONG TERM GOALS: Target date: 08/18/2024   To reduce pain by 3 grades on the pain scale in preparation for functional hand use during ADLs/IADLs Baseline: 06/27/24: 0/10 pain in the right hand. Pt. reports discomfort intermittently with left 5th digit flexion. Eval:  5/10 pain in the right hand Goal status: Progressed; Achieved  2.  Pt. Will formulate a full composite fist in preparation for securely holding kitchen/cooking utensils. Baseline:06/27/24: Digit flexion to the [facility]: Right: 2nd: 4.5cm(2cm) 3rd: 2.5cm(0cm)  4th: 2cm (0cm) 5th: 5cm(2cm)  Left: 2nd: 1cm(1cm) 3rd: 1.33m(1cm) 4th: 1.5cm(1cm) 5th: 5cm(1.5cm) Pt. Had difficulty with holding and using utensils during self-feeding, Pt. Is improving with larger cooking utensilsEval: Digit flexion to the [facility]:Mphyu: 2nd: 5cm(2cm) 3rd: 0cm  4th: 0cm 5th: 5cm(3cm) Left: 2nd: 1cm(1cm) 3rd: 2cm(0cm) 4th: 4cm(0cm) 5th: 5cm(3cm) Pt, has difficulty securely holding cooking utensils in her right hand Goal status: Progressing, Ongoing  3.  Pt. Will efficiently perform hand to mouth patterns during self-feeding with modified independence using her right hand. Baseline: 06/27/24: Pt. continues to have difficulty bringing her spoon all the way to her mouth, often needing to bring her mouth down to her spoon. Eval: Pt. Has difficulty completing hand to mouth patterns, often bringing her mouth down to meet the spoon 2/2 weakness in the RUE/hand Goal status: Progressing, Ongoing  4.  Pt. Will cut meat on her plate efficiently with modified independence Baseline: 06/27/24: Pt. Is improving with cutting meat, over continues to have difficulty cutting meat. Eval: Pt. Has difficulty cutting meat Goal status:  Progressing, Ongoing   5.  Pt. Will increase bilateral shoulder abduction by 10 degrees be able to efficiently perform hair care Baseline: 06/27/24: Shoulder abduction: Right: 109(120), Left: 892(887) pt. Is able to reach up further to her head, however has difficulty sustaining BUE in elevation while performing hair care. Eval: shoulder abduction: Right: 82(120), Left: 72(102) Goal status: Progressing  6.  Pt. Will write one sentence efficiently  with 75% in preparation for being able to fill in crossword puzzles. Baseline: 06/27/24: Name only with 90% legibility; one sentence completed with 50% legibility Eval: writing legibility: 75% for name only. Goal status: INITIAL  ASSESSMENT:  CLINICAL IMPRESSION:  Pt. reports that she  already applied to her hands this morning. Pt. reports that she has  difficulty reaching up to brush the hair at the back of her head. Pt. tolerated the exercises well today, and was able to reach higher for more reps with the RUE, and hand. Pt. was able to complete all the green level resistive clips on the right, and one with the left. Pt. continues to present with weakness proximally. Pt. continues to benefit from OT services to work on improving right hand pain, and to improve overall BUE functioning in order to increase engagement of the UEs during ADLs, and IADL tasks.    PERFORMANCE DEFICITS: in functional skills including ADLs, IADLs, coordination, dexterity, ROM, strength, pain, Fine motor control, Gross motor control, and UE functional use, , and psychosocial skills including coping strategies, environmental adaptation, habits, interpersonal interactions, and routines and behaviors.   IMPAIRMENTS: are limiting patient from ADLs, IADLs, and leisure.   CO-MORBIDITIES: may have co-morbidities  that affects occupational performance. Patient will benefit from skilled OT to address above impairments and improve overall function.  MODIFICATION OR ASSISTANCE TO COMPLETE EVALUATION: Min-Moderate modification of tasks or assist with assess necessary to complete an evaluation.  OT OCCUPATIONAL PROFILE AND HISTORY: Detailed assessment: Review of records and additional review of physical, cognitive, psychosocial history related to current functional performance.  CLINICAL DECISION MAKING: Moderate - several treatment options, min-mod task modification necessary  REHAB POTENTIAL: Good  EVALUATION COMPLEXITY: Moderate   PLAN:  OT FREQUENCY: 2x/week  OT DURATION: 12 weeks  PLANNED INTERVENTIONS: 97168 OT Re-evaluation, 97535 self care/ADL training, 02889 therapeutic exercise, 97530 therapeutic activity, 97112 neuromuscular re-education, 97140 manual therapy, 97018 paraffin, 02989 moist heat, 97010 cryotherapy, 97034 contrast bath, 97760 Orthotic Initial, 97763  Orthotic/Prosthetic subsequent, passive range of motion, balance training, functional mobility training, energy conservation, coping strategies training, patient/family education, and DME and/or AE instructions  RECOMMENDED OTHER SERVICES: PT for balance  CONSULTED AND AGREED WITH PLAN OF CARE: Patient  PLAN FOR NEXT SESSION:  Assess sensation, and hand function skills at the next visit, as well administer the MAM-20 outcome measure; treatment  Richardson Otter, MS, OTR/L  07/10/2024, 10:30 AM

## 2024-07-12 ENCOUNTER — Ambulatory Visit: Admitting: Occupational Therapy

## 2024-07-12 DIAGNOSIS — M6281 Muscle weakness (generalized): Secondary | ICD-10-CM

## 2024-07-12 NOTE — Therapy (Addendum)
 Occupational Therapy Treatment Note   Patient Name: Maria Lowe MRN: [id number] DOB:Jan 04, 1931, 89 y.o., female Today's Date: 07/12/2024   REFERRING PROVIDER: Katrinka Senior, MD  END OF SESSION:   OT End of Session - 07/12/24 1056     Visit Number 15    Number of Visits 24    Date for Recertification  08/17/24    OT Start Time 0930    OT Stop Time 1015    OT Time Calculation (min) 45 min    Behavior During Therapy [facility] for tasks assessed/performed          Past Medical History:  Diagnosis Date   Anticoagulant long-term use    eliquis -- managed by cardiology   Arthritis    knees   CKD (chronic kidney disease), stage III (HCC)    CLL (chronic lymphocytic leukemia) (HCC) 11/27/2013   oncologist-- dr lonn;  initial dx 03/ 2015, no treatment, active survillance   Essential hypertension    followed by pcp   Full dentures    History of gastric ulcer    stomach ulcer when young   Lymphocytosis 2009   followed oncology   Permanent atrial fibrillation (HCC) 11/2020   cardiologist-- primary dr w. barbaraann and followed by atrial clinic;    dx 003/ 2022, atenolol  for rate control and on eliquis    Rectocele    Thrombocythemia 2009   related to CLL, followed by dr lonn   Urinary incontinence    Vaginal atrophy    Past Surgical History:  Procedure Laterality Date   APPENDECTOMY     1980s   CATARACT EXTRACTION W/ INTRAOCULAR LENS IMPLANT Bilateral 2020   RECTOCELE REPAIR N/A 08/25/2021   Procedure: POSTERIOR REPAIR (RECTOCELE);  Surgeon: Jertson, Jill Evelyn, MD;  Location: [facility];  Service: Gynecology;  Laterality: N/A;   TONSILLECTOMY AND ADENOIDECTOMY     age 32   Patient Active Problem List   Diagnosis Date Noted   Severe mitral regurgitation 05/23/2024   Deficiency anemia 04/21/2024   Dehydration 04/21/2024   Acute left-sided low back pain with left-sided sciatica 06/25/2022   H/O rectocele repair 08/25/2021   Secondary  hypercoagulable state 12/10/2020   Persistent atrial fibrillation (HCC) 11/29/2020   Hyperlipidemia, unspecified 04/07/2018   CKD (chronic kidney disease), stage III (HCC) 02/09/2018   Rash 02/09/2018   Seborrheic dermatitis 01/08/2017   Former smoker 09/30/2015   Rectocele 03/29/2015   Thrombocytopenia 11/26/2014   CLL (chronic lymphocytic leukemia) (HCC) 11/27/2013   Hyperglycemia 05/08/2013   Essential hypertension, benign 11/07/2012   Obesity, unspecified 11/07/2012    ONSET DATE: 01/2024  REFERRING DIAG: Right hand weakness  THERAPY DIAG:  Muscle weakness (generalized)  Rationale for Evaluation and Treatment: Rehabilitation  SUBJECTIVE:   SUBJECTIVE STATEMENT:  Pt. reports that she will not be able to attend therapy next week, as her daughter is going on a cruise.  Accompanied by self; with daughter in the reception area  PERTINENT HISTORY:  Pt. was referred for outpatient OT services for right hand pain. Pt. had received a cat bite approximately one week prior to her going on a trip to [place]. She was hospitalized in [place] for one month due to developing a Cat Bite Pasturella Infection. While in the hospital, Pt. reports that she was placed in an arm cast. Pt. reports that while she was in the hospital she was bedbound, was not allowed to get out of bed for the entire month, and became weak, and deconditioned. Pt. Reports that  she does not know why she was placed in a cast. [place] return to the [place] Pt. started home health OT and PT services, however OT services were limited due to staffing constraints. Pt. was then referred to outpatient OT services. PMHx includes: Severe mitral valve regurgitation, pericadrial, and pleural effusions, A-Fib, Chronic Lymphocytic Anemia, multifactorial anemia and CKD stage III, Diverticulosis, constipation, HTN, Hyperlipidemia, Prediabetes.  PRECAUTIONS: None  WEIGHT BEARING RESTRICTIONS: No  PAIN:  Are you having pain? Intermittent 5/10  pain in the right CMC joint with radial deviation hand weight  FALLS: Has patient fallen in last 6 months? 1 fall in the last 6 months  LIVING ENVIRONMENT: Lives with: Resides with her daughter Lives in: House- one level Stairs: ramped entrance Has following equipment at home: Counselling psychologist, Environmental consultant - 4 wheeled, shower chair, bed side commode, and Grab bars, hospital bed, elevated toilet  PLOF: Independent, living alone, managing a household-per report  PATIENT GOALS:  To be able to cut her meat  OBJECTIVE:  Note: Objective measures were completed at Evaluation unless otherwise noted.  HAND DOMINANCE: Right  ADLs:  Transfers/ambulation related to ADLs: Supervision with a rollator walker Eating: Pt. Has difficulty with using utensils with the right hand, often bringing her head down to meet hand/utensil when eating. Pt. Has  weakness with bringing the cup to her mouth. Difficulty with cutting food. Grooming: Difficulty sustaining the BUE's in up long enough to perform hair care. Independent with oral care,  UB Dressing: Independent, requires increased time to complete. Pt. Does endorse having difficulty reaching up to put a shirt on over her head. LB Dressing:Reports being able to perform LE dressing, Uses slide-on footwear.  Toileting:Pt. Reports independently being able to perform toileting care needs using her left hand. (Of note, Pt. Reports that she has always used her left hand to perform toilet hygiene care.) Bathing: Independent, daughter provides supervision from outside the shower curtain. Daughter assists with drying her back due to limited reach Tub Shower transfers: Daughter provides supervision tub/shower t/f's   IADLs: Light housekeeping: Pt. reports that she has resumed some light housekeeping tasks, however this has been limited. Pt. Reports that she does not vacuum, or sweep due to rollator use. Meal Prep: Pt. Has resumed weekly cooking on Sundays. Reports  having difficulty cutting onions, as well as difficulty holding and using a beater. Pt. Reports that she uses a modified technique to hold kitchen/cooking utensils. Community mobility: Pt. Relies on family  and friends for transportation Medication management: Pt. Has difficulty manipulating medication efficiently with the right hand Handwriting: 50% legible for name only Leisure/Hobbies: Loves to cook; enjoys reading , and working crossword puzzles.  MOBILITY STATUS:  Supervision using a rollator  FUNCTIONAL OUTCOME MEASURES: 05/30/24: MAM-20: 11/20 items were completed. The following items Pt. indicated as a 2 -very hard to do: wringing out a towel, opening a wide mouth bottle-previously opened, cutting meat on a plate, tying shoe laces, zipping a jacket, and squeezing toothpaste on a toothbrush. The following items Pt. indicated as a 1-cannot do: opening a medication bottle with a childproof cap, cutting nails with a clipper, buttoning clothing, picking up a 1/2 full water pitcher, and writing 3-4 lines legibly.  UPPER EXTREMITY ROM:     ROM Right eval Right 06/27/24 Left eval Left 06/27/24  Shoulder flexion 130(150) Scaption 134(150)  100 scaption  Shoulder abduction 82(120) 109(120) 72(102) 892(887)  Shoulder adduction      Shoulder extension  Shoulder internal rotation      Shoulder external rotation      Elbow flexion [facility] WFL 148 WFL  Elbow extension [facility] WFL -60(-28) -35(-18)  Wrist flexion 26(60) 30(60) WFL WFL  Wrist extension 44(62) 46(62) WFL WFL  Wrist ulnar deviation      Wrist radial deviation      Wrist pronation      Wrist supination      (Blank rows = not tested)  Eval: Digit flexion to the [facility]:    Right: 2nd: 5cm(2cm) 3rd: 0cm  4th: 0cm 5th: 5cm(3cm)     Left: 2nd: 1cm(1cm) 3rd: 2cm(0cm) 4th: 4cm(0cm) 5th: 5cm(3cm)    06/27/24:  Digit flexion to the [facility]:    Right: 2nd: 4.5cm(2cm) 3rd: 2.5cm(0cm)  4th: 2cm (0cm) 5th: 5cm(2cm)     Left: 2nd: 1cm(1cm) 3rd:  1.55m(1cm) 4th: 1.5cm(1cm) 5th: 5cm(1.5cm)  UPPER EXTREMITY MMT:     MMT Right eval Right  06/27/24 Left eval Left 06/27/24  Shoulder flexion  3+/5  3-/5  Shoulder abduction 3-/5 3+/5 3-/5 3+/5  Shoulder adduction      Shoulder extension      Shoulder internal rotation      Shoulder external rotation      Middle trapezius      Lower trapezius      Elbow flexion 4/5 4/5 4/5 4/5  Elbow extension 4/5 4/5 2+/5 3-/5  Wrist flexion 3-/5 3-/5 4-/5 4-/5  Wrist extension 3-/5 3/5 4-/5 4-/5  Wrist ulnar deviation      Wrist radial deviation      Wrist pronation      Wrist supination      (Blank rows = not tested)  HAND FUNCTION: 05/30/24: Grip strength: Right: 8 lbs; Left: 12 lbs, Lateral pinch: Right: 4 lbs, Left: 3 lbs, and 3 point pinch: Right: 3 lbs, Left: 3 lbs  06/27/24: Grip strength: Right: 11 lbs; Left: 12 lbs, Lateral pinch: Right: 4 lbs, Left: 3 lbs, and 3 point pinch: Right: 3 lbs, Left: 3 lbs  COORDINATION:  COORDINATION: Eval:   9 Hole Peg test: Right: 48 sec; Left: 49 sec  06/27/24:  9 Hole Peg test: Right: 33 sec; Left: 49 sec  SENSATION: TBD   EDEMA: N/A    COGNITION: Overall cognitive status: Within functional limits for tasks assessed  VISION: Subjective report: No change from baseline  PERCEPTION: WFL                                                                                                                TREATMENT DATE: 07/12/24  Manual Therapy:  -Soft tissue massage was performed to the volar, dorsal, lateral, and medial aspect of the bilateral 2nd, and 5th digits in preparation for ROM -Manual therapy was performed independent of, and in preparation for therapeutic Ex.    Therapeutic Ex.:   -Pt. tolerated AROM/AAROM/PROM for right 2nd digit, and bilateral 5th digit MP, PIP, and DIP flexion to prepare for formulating a composite fist. -AROM/PROM digit MCP extension, followed by active  digit extension reps performed with the hand  positioned flat at the tabletop surface. -Bilateral UE strengthening with 2# hand weight for right elbow flexion, extension, forearm supination, pronation with 2#, and left with 1#, wrist extension, and radial deviation with 1# bilaterally 1 set 10 reps each   PATIENT EDUCATION: Education details: digit ROM, bilateral hand wrist strengthening Person educated: Patient Education method: Explanation, Demonstration, Tactile cues, and Verbal cues Education comprehension: verbalized understanding, returned demonstration, verbal cues required, tactile cues required, and needs further education  HOME EXERCISE PROGRAM:    -Bilateral elbow flexion, and extension with yellow theraband.  -Yellow theraputty strengthening  following a HEP through [employer]. -contrasting heat 3 min., followed by 1 min. Cold  for 1 min. Ending with heat for 3 min. To the right hand. (Pt. Prefers just heat at home), moist heat to the left hand. -Massage to the right hand  -Right hand digit extension with the hand flat at the tabletop surface -Blue resistive foam block for Bilateral gross gripping, lateral pinch, and 3pt. Pinch strength -UE shoulder stabilization in supine with the shoulder flexed to 90 degrees with the elbow extended-formulating the alphabet    GOALS: Goals reviewed with patient? Yes  SHORT TERM GOALS: Target date: 07/07/2024     Pt. Will be independent with HEPs for UE strength, and coordination Baseline: 06/27/24: Independent; continue Eval: No current HEP Goal status: Ongoing   LONG TERM GOALS: Target date: 08/18/2024   To reduce pain by 3 grades on the pain scale in preparation for functional hand use during ADLs/IADLs Baseline: 06/27/24: 0/10 pain in the right hand. Pt. reports discomfort intermittently with left 5th digit flexion. Eval:  5/10 pain in the right hand Goal status: Progressed; Achieved  2.  Pt. Will formulate a full composite fist in preparation for securely holding  kitchen/cooking utensils. Baseline:06/27/24: Digit flexion to the [facility]: Right: 2nd: 4.5cm(2cm) 3rd: 2.5cm(0cm)  4th: 2cm (0cm) 5th: 5cm(2cm) Left: 2nd: 1cm(1cm) 3rd: 1.91m(1cm) 4th: 1.5cm(1cm) 5th: 5cm(1.5cm) Pt. Had difficulty with holding and using utensils during self-feeding, Pt. Is improving with larger cooking utensilsEval: Digit flexion to the IER:Mphyu: 2nd: 5cm(2cm) 3rd: 0cm  4th: 0cm 5th: 5cm(3cm) Left: 2nd: 1cm(1cm) 3rd: 2cm(0cm) 4th: 4cm(0cm) 5th: 5cm(3cm) Pt, has difficulty securely holding cooking utensils in her right hand Goal status: Progressing, Ongoing  3.  Pt. Will efficiently perform hand to mouth patterns during self-feeding with modified independence using her right hand. Baseline: 06/27/24: Pt. continues to have difficulty bringing her spoon all the way to her mouth, often needing to bring her mouth down to her spoon. Eval: Pt. Has difficulty completing hand to mouth patterns, often bringing her mouth down to meet the spoon 2/2 weakness in the RUE/hand Goal status: Progressing, Ongoing  4.  Pt. Will cut meat on her plate efficiently with modified independence Baseline: 06/27/24: Pt. Is improving with cutting meat, over continues to have difficulty cutting meat. Eval: Pt. Has difficulty cutting meat Goal status:  Progressing, Ongoing   5.  Pt. Will increase bilateral shoulder abduction by 10 degrees be able to efficiently perform hair care Baseline: 06/27/24: Shoulder abduction: Right: 109(120), Left: 892(887) pt. Is able to reach up further to her head, however has difficulty sustaining BUE in elevation while performing hair care. Eval: shoulder abduction: Right: 82(120), Left: 72(102) Goal status: Progressing  6.  Pt. Will write one sentence efficiently  with 75% in preparation for being able to fill in crossword puzzles. Baseline: 06/27/24: Name only with 90% legibility; one sentence completed  with 50% legibility Eval: writing legibility: 75% for name only. Goal status:  INITIAL  ASSESSMENT:  CLINICAL IMPRESSION:  Pt. reports that she is still having difficulty reaching up to the back of her head to brush her hair. Pt. reports having difficulty holding a glass, and feeding herself with her right hand. Pt. Tolerated the exercises well, overall with cues, and support proximally. Pt. does present with intermittent right thumb CMC joint pain during the radial deviation exercises. Pt. continues to benefit from OT services to work on improving right hand pain, and to improve overall BUE functioning in order to increase engagement of the UEs during ADLs, and IADL tasks.    PERFORMANCE DEFICITS: in functional skills including ADLs, IADLs, coordination, dexterity, ROM, strength, pain, Fine motor control, Gross motor control, and UE functional use, , and psychosocial skills including coping strategies, environmental adaptation, habits, interpersonal interactions, and routines and behaviors.   IMPAIRMENTS: are limiting patient from ADLs, IADLs, and leisure.   CO-MORBIDITIES: may have co-morbidities  that affects occupational performance. Patient will benefit from skilled OT to address above impairments and improve overall function.  MODIFICATION OR ASSISTANCE TO COMPLETE EVALUATION: Min-Moderate modification of tasks or assist with assess necessary to complete an evaluation.  OT OCCUPATIONAL PROFILE AND HISTORY: Detailed assessment: Review of records and additional review of physical, cognitive, psychosocial history related to current functional performance.  CLINICAL DECISION MAKING: Moderate - several treatment options, min-mod task modification necessary  REHAB POTENTIAL: Good  EVALUATION COMPLEXITY: Moderate   PLAN:  OT FREQUENCY: 2x/week  OT DURATION: 12 weeks  PLANNED INTERVENTIONS: 97168 OT Re-evaluation, 97535 self care/ADL training, 02889 therapeutic exercise, 97530 therapeutic activity, 97112 neuromuscular re-education, 97140 manual therapy, 97018  paraffin, 02989 moist heat, 97010 cryotherapy, 97034 contrast bath, 97760 Orthotic Initial, 97763 Orthotic/Prosthetic subsequent, passive range of motion, balance training, functional mobility training, energy conservation, coping strategies training, patient/family education, and DME and/or AE instructions  RECOMMENDED OTHER SERVICES: PT for balance  CONSULTED AND AGREED WITH PLAN OF CARE: Patient  PLAN FOR NEXT SESSION:  Assess sensation, and hand function skills at the next visit, as well administer the MAM-20 outcome measure; treatment  Richardson Otter, MS, OTR/L  07/12/2024, 11:11 AM

## 2024-07-17 ENCOUNTER — Encounter: Admitting: Occupational Therapy

## 2024-07-19 ENCOUNTER — Encounter: Admitting: Occupational Therapy

## 2024-07-24 ENCOUNTER — Ambulatory Visit: Attending: Family Medicine | Admitting: Occupational Therapy

## 2024-07-24 DIAGNOSIS — R262 Difficulty in walking, not elsewhere classified: Secondary | ICD-10-CM | POA: Diagnosis not present

## 2024-07-24 DIAGNOSIS — R2681 Unsteadiness on feet: Secondary | ICD-10-CM | POA: Diagnosis not present

## 2024-07-24 DIAGNOSIS — R278 Other lack of coordination: Secondary | ICD-10-CM | POA: Diagnosis not present

## 2024-07-24 DIAGNOSIS — M6281 Muscle weakness (generalized): Secondary | ICD-10-CM | POA: Diagnosis not present

## 2024-07-24 NOTE — Therapy (Addendum)
 Occupational Therapy Treatment Note   Patient Name: Maria Lowe MRN: [id number] DOB:16-Sep-1931, 89 y.o., female Today's Date: 07/24/2024   REFERRING PROVIDER: Katrinka Senior, MD  END OF SESSION:   OT End of Session - 07/24/24 1453     Visit Number 16    Date for Recertification  08/17/24    OT Start Time 0930    OT Stop Time 1015    OT Time Calculation (min) 45 min    Activity Tolerance Patient tolerated treatment well    Behavior During Therapy WFL for tasks assessed/performed          Past Medical History:  Diagnosis Date   Anticoagulant long-term use    eliquis -- managed by cardiology   Arthritis    knees   CKD (chronic kidney disease), stage III (HCC)    CLL (chronic lymphocytic leukemia) (HCC) 11/27/2013   oncologist-- dr lonn;  initial dx 03/ 2015, no treatment, active survillance   Essential hypertension    followed by pcp   Full dentures    History of gastric ulcer    stomach ulcer when young   Lymphocytosis 2009   followed oncology   Permanent atrial fibrillation (HCC) 11/2020   cardiologist-- primary dr w. barbaraann and followed by atrial clinic;    dx 003/ 2022, atenolol  for rate control and on eliquis    Rectocele    Thrombocythemia 2009   related to CLL, followed by dr lonn   Urinary incontinence    Vaginal atrophy    Past Surgical History:  Procedure Laterality Date   APPENDECTOMY     1980s   CATARACT EXTRACTION W/ INTRAOCULAR LENS IMPLANT Bilateral 2020   RECTOCELE REPAIR N/A 08/25/2021   Procedure: POSTERIOR REPAIR (RECTOCELE);  Surgeon: Jertson, Jill Evelyn, MD;  Location: [facility];  Service: Gynecology;  Laterality: N/A;   TONSILLECTOMY AND ADENOIDECTOMY     age 60   Patient Active Problem List   Diagnosis Date Noted   Severe mitral regurgitation 05/23/2024   Deficiency anemia 04/21/2024   Dehydration 04/21/2024   Acute left-sided low back pain with left-sided sciatica 06/25/2022   H/O rectocele repair  08/25/2021   Secondary hypercoagulable state 12/10/2020   Persistent atrial fibrillation (HCC) 11/29/2020   Hyperlipidemia, unspecified 04/07/2018   CKD (chronic kidney disease), stage III (HCC) 02/09/2018   Rash 02/09/2018   Seborrheic dermatitis 01/08/2017   Former smoker 09/30/2015   Rectocele 03/29/2015   Thrombocytopenia 11/26/2014   CLL (chronic lymphocytic leukemia) (HCC) 11/27/2013   Hyperglycemia 05/08/2013   Essential hypertension, benign 11/07/2012   Obesity, unspecified 11/07/2012    ONSET DATE: 01/2024  REFERRING DIAG: Right hand weakness  THERAPY DIAG:  Muscle weakness (generalized)  Rationale for Evaluation and Treatment: Rehabilitation  SUBJECTIVE:   SUBJECTIVE STATEMENT:  Pt. reports that she is concerned about her son, who is having surgery this morning.  Accompanied by self; with daughter in the reception area  PERTINENT HISTORY:  Pt. was referred for outpatient OT services for right hand pain. Pt. had received a cat bite approximately one week prior to her going on a trip to [place]. She was hospitalized in [place] for one month due to developing a Cat Bite Pasturella Infection. While in the hospital, Pt. reports that she was placed in an arm cast. Pt. reports that while she was in the hospital she was bedbound, was not allowed to get out of bed for the entire month, and became weak, and deconditioned. Pt. Reports that she does not know  why she was placed in a cast. [place] return to the [place] Pt. started home health OT and PT services, however OT services were limited due to staffing constraints. Pt. was then referred to outpatient OT services. PMHx includes: Severe mitral valve regurgitation, pericadrial, and pleural effusions, A-Fib, Chronic Lymphocytic Anemia, multifactorial anemia and CKD stage III, Diverticulosis, constipation, HTN, Hyperlipidemia, Prediabetes.  PRECAUTIONS: None  WEIGHT BEARING RESTRICTIONS: No  PAIN:  Are you having pain? 0/10  FALLS:  Has patient fallen in last 6 months? 1 fall in the last 6 months  LIVING ENVIRONMENT: Lives with: Resides with her daughter Lives in: House- one level Stairs: ramped entrance Has following equipment at home: Counselling psychologist, Environmental Consultant - 4 wheeled, shower chair, bed side commode, and Grab bars, hospital bed, elevated toilet  PLOF: Independent, living alone, managing a household-per report  PATIENT GOALS:  To be able to cut her meat  OBJECTIVE:  Note: Objective measures were completed at Evaluation unless otherwise noted.  HAND DOMINANCE: Right  ADLs:  Transfers/ambulation related to ADLs: Supervision with a rollator walker Eating: Pt. has difficulty with using utensils with the right hand, often bringing her head down to meet hand/utensil when eating. Pt. Has  weakness with bringing the cup to her mouth. Difficulty with cutting food. Grooming: Difficulty sustaining the BUE's in up long enough to perform hair care. Independent with oral care,  UB Dressing: Independent, requires increased time to complete. Pt. Does endorse having difficulty reaching up to put a shirt on over her head. LB Dressing:Reports being able to perform LE dressing, Uses slide-on footwear.  Toileting:Pt. Reports independently being able to perform toileting care needs using her left hand. (Of note, Pt. Reports that she has always used her left hand to perform toilet hygiene care.) Bathing: Independent, daughter provides supervision from outside the shower curtain. Daughter assists with drying her back due to limited reach Tub Shower transfers: Daughter provides supervision tub/shower t/f's   IADLs: Light housekeeping: Pt. reports that she has resumed some light housekeeping tasks, however this has been limited. Pt. Reports that she does not vacuum, or sweep due to rollator use. Meal Prep: Pt. Has resumed weekly cooking on Sundays. Reports having difficulty cutting onions, as well as difficulty holding and using a  beater. Pt. Reports that she uses a modified technique to hold kitchen/cooking utensils. Community mobility: Pt. Relies on family  and friends for transportation Medication management: Pt. Has difficulty manipulating medication efficiently with the right hand Handwriting: 50% legible for name only Leisure/Hobbies: Loves to cook; enjoys reading , and working crossword puzzles.  MOBILITY STATUS:  Supervision using a rollator  FUNCTIONAL OUTCOME MEASURES: 05/30/24: MAM-20: 11/20 items were completed. The following items Pt. indicated as a 2 -very hard to do: wringing out a towel, opening a wide mouth bottle-previously opened, cutting meat on a plate, tying shoe laces, zipping a jacket, and squeezing toothpaste on a toothbrush. The following items Pt. indicated as a 1-cannot do: opening a medication bottle with a childproof cap, cutting nails with a clipper, buttoning clothing, picking up a 1/2 full water pitcher, and writing 3-4 lines legibly.  UPPER EXTREMITY ROM:     ROM Right eval Right 06/27/24 Left eval Left 06/27/24  Shoulder flexion 130(150) Scaption 134(150)  100 scaption  Shoulder abduction 82(120) 109(120) 72(102) 107(112)  Shoulder adduction      Shoulder extension      Shoulder internal rotation      Shoulder external rotation  Elbow flexion [facility] WFL 148 WFL  Elbow extension [facility] WFL -60(-28) -35(-18)  Wrist flexion 26(60) 30(60) WFL WFL  Wrist extension 44(62) 46(62) WFL WFL  Wrist ulnar deviation      Wrist radial deviation      Wrist pronation      Wrist supination      (Blank rows = not tested)  Eval: Digit flexion to the [facility]:    Right: 2nd: 5cm(2cm) 3rd: 0cm  4th: 0cm 5th: 5cm(3cm)     Left: 2nd: 1cm(1cm) 3rd: 2cm(0cm) 4th: 4cm(0cm) 5th: 5cm(3cm)    06/27/24:  Digit flexion to the [facility]:    Right: 2nd: 4.5cm(2cm) 3rd: 2.5cm(0cm)  4th: 2cm (0cm) 5th: 5cm(2cm)     Left: 2nd: 1cm(1cm) 3rd: 1.15m(1cm) 4th: 1.5cm(1cm) 5th: 5cm(1.5cm)  UPPER EXTREMITY MMT:     MMT  Right eval Right  06/27/24 Left eval Left 06/27/24  Shoulder flexion  3+/5  3-/5  Shoulder abduction 3-/5 3+/5 3-/5 3+/5  Shoulder adduction      Shoulder extension      Shoulder internal rotation      Shoulder external rotation      Middle trapezius      Lower trapezius      Elbow flexion 4/5 4/5 4/5 4/5  Elbow extension 4/5 4/5 2+/5 3-/5  Wrist flexion 3-/5 3-/5 4-/5 4-/5  Wrist extension 3-/5 3/5 4-/5 4-/5  Wrist ulnar deviation      Wrist radial deviation      Wrist pronation      Wrist supination      (Blank rows = not tested)  HAND FUNCTION: 05/30/24: Grip strength: Right: 8 lbs; Left: 12 lbs, Lateral pinch: Right: 4 lbs, Left: 3 lbs, and 3 point pinch: Right: 3 lbs, Left: 3 lbs  06/27/24: Grip strength: Right: 11 lbs; Left: 12 lbs, Lateral pinch: Right: 4 lbs, Left: 3 lbs, and 3 point pinch: Right: 3 lbs, Left: 3 lbs  COORDINATION:  COORDINATION: Eval:   9 Hole Peg test: Right: 48 sec; Left: 49 sec  06/27/24:  9 Hole Peg test: Right: 33 sec; Left: 49 sec  SENSATION: TBD   EDEMA: N/A    COGNITION: Overall cognitive status: Within functional limits for tasks assessed  VISION: Subjective report: No change from baseline  PERCEPTION: WFL                                                                                                                TREATMENT DATE: 07/24/24  Therapeutic Ex.:   -Performed AROM, followed by PROM to the end range for shoulder flexion, abduction, external rotation, horizontal abduction. -Pt. tolerated bilateral AROM/AAROM/PROM for right 2nd digit, and bilateral 5th digit MP, PIP, and DIP flexion to prepare for formulating a composite fist. -Right gross grip strengthening with 15 # of grip strength resistive force. The gripper was set with 1 red, and 1 yellow resistive band. 1-2 sets 10 reps -Right lateral, and 3pt. pinch strengthening using yellow, red, green, and blue level resistive clips  PATIENT  EDUCATION: Education details: digit ROM, UE strengthening Person educated: Patient Education method: Explanation, Demonstration, Tactile cues, and Verbal cues Education comprehension: verbalized understanding, returned demonstration, verbal cues required, tactile cues required, and needs further education  HOME EXERCISE PROGRAM:    -Bilateral elbow flexion, and extension with yellow theraband.  -Yellow theraputty strengthening  following a HEP through [employer]. -contrasting heat 3 min., followed by 1 min. Cold  for 1 min. Ending with heat for 3 min. To the right hand. (Pt. Prefers just heat at home), moist heat to the left hand. -Massage to the right hand  -Right hand digit extension with the hand flat at the tabletop surface -Blue resistive foam block for Bilateral gross gripping, lateral pinch, and 3pt. Pinch strength -UE shoulder stabilization in supine with the shoulder flexed to 90 degrees with the elbow extended-formulating the alphabet    GOALS: Goals reviewed with patient? Yes  SHORT TERM GOALS: Target date: 07/07/2024     Pt. Will be independent with HEPs for UE strength, and coordination Baseline: 06/27/24: Independent; continue Eval: No current HEP Goal status: Ongoing   LONG TERM GOALS: Target date: 08/18/2024   To reduce pain by 3 grades on the pain scale in preparation for functional hand use during ADLs/IADLs Baseline: 06/27/24: 0/10 pain in the right hand. Pt. reports discomfort intermittently with left 5th digit flexion. Eval:  5/10 pain in the right hand Goal status: Progressed; Achieved  2.  Pt. Will formulate a full composite fist in preparation for securely holding kitchen/cooking utensils. Baseline:06/27/24: Digit flexion to the [facility]: Right: 2nd: 4.5cm(2cm) 3rd: 2.5cm(0cm)  4th: 2cm (0cm) 5th: 5cm(2cm) Left: 2nd: 1cm(1cm) 3rd: 1.74m(1cm) 4th: 1.5cm(1cm) 5th: 5cm(1.5cm) Pt. Had difficulty with holding and using utensils during self-feeding, Pt. Is improving with  larger cooking utensilsEval: Digit flexion to the [facility]:Mphyu: 2nd: 5cm(2cm) 3rd: 0cm  4th: 0cm 5th: 5cm(3cm) Left: 2nd: 1cm(1cm) 3rd: 2cm(0cm) 4th: 4cm(0cm) 5th: 5cm(3cm) Pt, has difficulty securely holding cooking utensils in her right hand Goal status: Progressing, Ongoing  3.  Pt. Will efficiently perform hand to mouth patterns during self-feeding with modified independence using her right hand. Baseline: 06/27/24: Pt. continues to have difficulty bringing her spoon all the way to her mouth, often needing to bring her mouth down to her spoon. Eval: Pt. Has difficulty completing hand to mouth patterns, often bringing her mouth down to meet the spoon 2/2 weakness in the RUE/hand Goal status: Progressing, Ongoing  4.  Pt. Will cut meat on her plate efficiently with modified independence Baseline: 06/27/24: Pt. Is improving with cutting meat, over continues to have difficulty cutting meat. Eval: Pt. Has difficulty cutting meat Goal status:  Progressing, Ongoing   5.  Pt. Will increase bilateral shoulder abduction by 10 degrees be able to efficiently perform hair care Baseline: 06/27/24: Shoulder abduction: Right: 109(120), Left: 892(887) pt. Is able to reach up further to her head, however has difficulty sustaining BUE in elevation while performing hair care. Eval: shoulder abduction: Right: 82(120), Left: 72(102) Goal status: Progressing  6.  Pt. Will write one sentence efficiently  with 75% in preparation for being able to fill in crossword puzzles. Baseline: 06/27/24: Name only with 90% legibility; one sentence completed with 50% legibility Eval: writing legibility: 75% for name only. Goal status: INITIAL  ASSESSMENT:  CLINICAL IMPRESSION:  Pt. reports that she is still having difficulty feeding herself, and reaching up to the back of her head during hair care. Pt. tolerated all exercises well today.  Pt. required the  grip strengthening to be modified/adjusted from 2 red level bands to one  yellow band resistance. Pt. continues to require cues for proper form, and technique during the pinch strengthening exercises. Pt. continues to benefit from OT services to work on improving right hand pain, and to improve overall BUE functioning in order to increase engagement of the UEs during ADLs, and IADL tasks.    PERFORMANCE DEFICITS: in functional skills including ADLs, IADLs, coordination, dexterity, ROM, strength, pain, Fine motor control, Gross motor control, and UE functional use, , and psychosocial skills including coping strategies, environmental adaptation, habits, interpersonal interactions, and routines and behaviors.   IMPAIRMENTS: are limiting patient from ADLs, IADLs, and leisure.   CO-MORBIDITIES: may have co-morbidities  that affects occupational performance. Patient will benefit from skilled OT to address above impairments and improve overall function.  MODIFICATION OR ASSISTANCE TO COMPLETE EVALUATION: Min-Moderate modification of tasks or assist with assess necessary to complete an evaluation.  OT OCCUPATIONAL PROFILE AND HISTORY: Detailed assessment: Review of records and additional review of physical, cognitive, psychosocial history related to current functional performance.  CLINICAL DECISION MAKING: Moderate - several treatment options, min-mod task modification necessary  REHAB POTENTIAL: Good  EVALUATION COMPLEXITY: Moderate   PLAN:  OT FREQUENCY: 2x/week  OT DURATION: 12 weeks  PLANNED INTERVENTIONS: 97168 OT Re-evaluation, 97535 self care/ADL training, 02889 therapeutic exercise, 97530 therapeutic activity, 97112 neuromuscular re-education, 97140 manual therapy, 97018 paraffin, 02989 moist heat, 97010 cryotherapy, 97034 contrast bath, 97760 Orthotic Initial, 97763 Orthotic/Prosthetic subsequent, passive range of motion, balance training, functional mobility training, energy conservation, coping strategies training, patient/family education, and DME and/or AE  instructions  RECOMMENDED OTHER SERVICES: PT for balance  CONSULTED AND AGREED WITH PLAN OF CARE: Patient  PLAN FOR NEXT SESSION:  Assess sensation, and hand function skills at the next visit, as well administer the MAM-20 outcome measure; treatment  Richardson Otter, MS, OTR/L  07/24/2024, 2:56 PM

## 2024-07-26 ENCOUNTER — Other Ambulatory Visit: Payer: Self-pay | Admitting: Family Medicine

## 2024-07-26 ENCOUNTER — Ambulatory Visit: Admitting: Occupational Therapy

## 2024-07-26 DIAGNOSIS — M6281 Muscle weakness (generalized): Secondary | ICD-10-CM | POA: Diagnosis not present

## 2024-07-26 NOTE — Therapy (Signed)
 Occupational Therapy Treatment Note   Patient Name: Maria Lowe MRN: [id number] DOB:Feb 05, 1931, 89 y.o., female Today's Date: 07/26/2024   REFERRING PROVIDER: Katrinka Senior, MD  END OF SESSION:   OT End of Session - 07/26/24 1433     Visit Number 17    Number of Visits 24    Date for Recertification  08/17/24    OT Start Time 1000    OT Stop Time 1045    OT Time Calculation (min) 45 min    Activity Tolerance Patient tolerated treatment well    Behavior During Therapy WFL for tasks assessed/performed          Past Medical History:  Diagnosis Date   Anticoagulant long-term use    eliquis -- managed by cardiology   Arthritis    knees   CKD (chronic kidney disease), stage III (HCC)    CLL (chronic lymphocytic leukemia) (HCC) 11/27/2013   oncologist-- dr lonn;  initial dx 03/ 2015, no treatment, active survillance   Essential hypertension    followed by pcp   Full dentures    History of gastric ulcer    stomach ulcer when young   Lymphocytosis 2009   followed oncology   Permanent atrial fibrillation (HCC) 11/2020   cardiologist-- primary dr w. barbaraann and followed by atrial clinic;    dx 003/ 2022, atenolol  for rate control and on eliquis    Rectocele    Thrombocythemia 2009   related to CLL, followed by dr lonn   Urinary incontinence    Vaginal atrophy    Past Surgical History:  Procedure Laterality Date   APPENDECTOMY     1980s   CATARACT EXTRACTION W/ INTRAOCULAR LENS IMPLANT Bilateral 2020   RECTOCELE REPAIR N/A 08/25/2021   Procedure: POSTERIOR REPAIR (RECTOCELE);  Surgeon: Jertson, Jill Evelyn, MD;  Location: [facility];  Service: Gynecology;  Laterality: N/A;   TONSILLECTOMY AND ADENOIDECTOMY     age 42   Patient Active Problem List   Diagnosis Date Noted   Severe mitral regurgitation 05/23/2024   Deficiency anemia 04/21/2024   Dehydration 04/21/2024   Acute left-sided low back pain with left-sided sciatica 06/25/2022   H/O  rectocele repair 08/25/2021   Secondary hypercoagulable state 12/10/2020   Persistent atrial fibrillation (HCC) 11/29/2020   Hyperlipidemia, unspecified 04/07/2018   CKD (chronic kidney disease), stage III (HCC) 02/09/2018   Rash 02/09/2018   Seborrheic dermatitis 01/08/2017   Former smoker 09/30/2015   Rectocele 03/29/2015   Thrombocytopenia 11/26/2014   CLL (chronic lymphocytic leukemia) (HCC) 11/27/2013   Hyperglycemia 05/08/2013   Essential hypertension, benign 11/07/2012   Obesity, unspecified 11/07/2012    ONSET DATE: 01/2024  REFERRING DIAG: Right hand weakness  THERAPY DIAG:  Muscle weakness (generalized)  Rationale for Evaluation and Treatment: Rehabilitation  SUBJECTIVE:   SUBJECTIVE STATEMENT:  Pt.'s daughter reports that the Pt. needs to be able to climb stairs on Thanksgiving. Pt.'s daughter reports that she would like for her to progress from the rollator as she is concerned about spacing while using it in the house. A referral request was submitted to PT.  Accompanied by self; with daughter in the reception area  PERTINENT HISTORY:  Pt. was referred for outpatient OT services for right hand pain. Pt. had received a cat bite approximately one week prior to her going on a trip to [place]. She was hospitalized in [place] for one month due to developing a Cat Bite Pasturella Infection. While in the hospital, Pt. reports that she was placed  in an arm cast. Pt. reports that while she was in the hospital she was bedbound, was not allowed to get out of bed for the entire month, and became weak, and deconditioned. Pt. Reports that she does not know why she was placed in a cast. [place] return [place] the [place] Pt. started home health OT and PT services, however OT services were limited due to staffing constraints. Pt. was then referred to outpatient OT services. PMHx includes: Severe mitral valve regurgitation, pericadrial, and pleural effusions, A-Fib, Chronic Lymphocytic Anemia,  multifactorial anemia and CKD stage III, Diverticulosis, constipation, HTN, Hyperlipidemia, Prediabetes.  PRECAUTIONS: None  WEIGHT BEARING RESTRICTIONS: No  PAIN:  Are you having pain? 0/10  FALLS: Has patient fallen in last 6 months? 1 fall in the last 6 months  LIVING ENVIRONMENT: Lives with: Resides with her daughter Lives in: House- one level Stairs: ramped entrance Has following equipment at home: Counselling psychologist, Environmental Consultant - 4 wheeled, shower chair, bed side commode, and Grab bars, hospital bed, elevated toilet  PLOF: Independent, living alone, managing a household-per report  PATIENT GOALS:  To be able to cut her meat  OBJECTIVE:  Note: Objective measures were completed at Evaluation unless otherwise noted.  HAND DOMINANCE: Right  ADLs:  Transfers/ambulation related to ADLs: Supervision with a rollator walker Eating: Pt. has difficulty with using utensils with the right hand, often bringing her head down to meet hand/utensil when eating. Pt. Has  weakness with bringing the cup to her mouth. Difficulty with cutting food. Grooming: Difficulty sustaining the BUE's in up long enough to perform hair care. Independent with oral care,  UB Dressing: Independent, requires increased time to complete. Pt. Does endorse having difficulty reaching up to put a shirt on over her head. LB Dressing:Reports being able to perform LE dressing, Uses slide-on footwear.  Toileting:Pt. Reports independently being able to perform toileting care needs using her left hand. (Of note, Pt. Reports that she has always used her left hand to perform toilet hygiene care.) Bathing: Independent, daughter provides supervision from outside the shower curtain. Daughter assists with drying her back due to limited reach Tub Shower transfers: Daughter provides supervision tub/shower t/f's   IADLs: Light housekeeping: Pt. reports that she has resumed some light housekeeping tasks, however this has been limited.  Pt. Reports that she does not vacuum, or sweep due to rollator use. Meal Prep: Pt. Has resumed weekly cooking on Sundays. Reports having difficulty cutting onions, as well as difficulty holding and using a beater. Pt. Reports that she uses a modified technique to hold kitchen/cooking utensils. Community mobility: Pt. Relies on family  and friends for transportation Medication management: Pt. Has difficulty manipulating medication efficiently with the right hand Handwriting: 50% legible for name only Leisure/Hobbies: Loves to cook; enjoys reading , and working crossword puzzles.  MOBILITY STATUS:  Supervision using a rollator  FUNCTIONAL OUTCOME MEASURES: 05/30/24: MAM-20: 11/20 items were completed. The following items Pt. indicated as a 2 -very hard to do: wringing out a towel, opening a wide mouth bottle-previously opened, cutting meat on a plate, tying shoe laces, zipping a jacket, and squeezing toothpaste on a toothbrush. The following items Pt. indicated as a 1-cannot do: opening a medication bottle with a childproof cap, cutting nails with a clipper, buttoning clothing, picking up a 1/2 full water pitcher, and writing 3-4 lines legibly.  UPPER EXTREMITY ROM:     ROM Right eval Right 06/27/24 Left eval Left 06/27/24  Shoulder flexion 130(150) Scaption 134(150)  100 scaption  Shoulder abduction 82(120) 109(120) 72(102) 892(887)  Shoulder adduction      Shoulder extension      Shoulder internal rotation      Shoulder external rotation      Elbow flexion [facility] WFL 148 WFL  Elbow extension [facility] WFL -60(-28) -35(-18)  Wrist flexion 26(60) 30(60) WFL WFL  Wrist extension 44(62) 46(62) WFL WFL  Wrist ulnar deviation      Wrist radial deviation      Wrist pronation      Wrist supination      (Blank rows = not tested)  Eval: Digit flexion to the [facility]:    Right: 2nd: 5cm(2cm) 3rd: 0cm  4th: 0cm 5th: 5cm(3cm)     Left: 2nd: 1cm(1cm) 3rd: 2cm(0cm) 4th: 4cm(0cm) 5th:  5cm(3cm)    06/27/24:  Digit flexion to the [facility]:    Right: 2nd: 4.5cm(2cm) 3rd: 2.5cm(0cm)  4th: 2cm (0cm) 5th: 5cm(2cm)     Left: 2nd: 1cm(1cm) 3rd: 1.21m(1cm) 4th: 1.5cm(1cm) 5th: 5cm(1.5cm)  UPPER EXTREMITY MMT:     MMT Right eval Right  06/27/24 Left eval Left 06/27/24  Shoulder flexion  3+/5  3-/5  Shoulder abduction 3-/5 3+/5 3-/5 3+/5  Shoulder adduction      Shoulder extension      Shoulder internal rotation      Shoulder external rotation      Middle trapezius      Lower trapezius      Elbow flexion 4/5 4/5 4/5 4/5  Elbow extension 4/5 4/5 2+/5 3-/5  Wrist flexion 3-/5 3-/5 4-/5 4-/5  Wrist extension 3-/5 3/5 4-/5 4-/5  Wrist ulnar deviation      Wrist radial deviation      Wrist pronation      Wrist supination      (Blank rows = not tested)  HAND FUNCTION: 05/30/24: Grip strength: Right: 8 lbs; Left: 12 lbs, Lateral pinch: Right: 4 lbs, Left: 3 lbs, and 3 point pinch: Right: 3 lbs, Left: 3 lbs  06/27/24: Grip strength: Right: 11 lbs; Left: 12 lbs, Lateral pinch: Right: 4 lbs, Left: 3 lbs, and 3 point pinch: Right: 3 lbs, Left: 3 lbs  COORDINATION:  COORDINATION: Eval:   9 Hole Peg test: Right: 48 sec; Left: 49 sec  06/27/24:  9 Hole Peg test: Right: 33 sec; Left: 49 sec  SENSATION: TBD   EDEMA: N/A    COGNITION: Overall cognitive status: Within functional limits for tasks assessed  VISION: Subjective report: No change from baseline  PERCEPTION: WFL                                                                                                                TREATMENT DATE: 07/26/24  Therapeutic Ex.:   -Performed AROM, followed by PROM to the end range for shoulder flexion, abduction, external rotation, horizontal abduction. -Pt. tolerated bilateral AROM/AAROM/PROM for right 2nd digit, and bilateral 5th digit MP, PIP, and DIP flexion to prepare for formulating a composite fist. -Right gross grip strengthening with 15 #  of grip strength  resistive force. The gripper was set with 1 red, and 1 yellow resistive band. 1-2 sets 10 reps. Grip strengthening was performed  with the gripper in a vertical position, followed by horizontally.  Therapeutic Activities:  -Facilitated simulated hand to mouth patterns in preparation for self-feeding skills attempted with 2# cuff weight at the right forearm, modified to 1#. -Facilitated alternating digit flexion, and extension moving a 1 cylindrical foam roll proximal to distal in her hand.  PATIENT EDUCATION: Education details: digit ROM, UE strengthening Person educated: Patient Education method: Explanation, Demonstration, Tactile cues, and Verbal cues Education comprehension: verbalized understanding, returned demonstration, verbal cues required, tactile cues required, and needs further education  HOME EXERCISE PROGRAM:    -Bilateral elbow flexion, and extension with yellow theraband.  -Yellow theraputty strengthening  following a HEP through [facility]. -contrasting heat 3 min., followed by 1 min. Cold  for 1 min. Ending with heat for 3 min. To the right hand. (Pt. Prefers just heat at home), moist heat to the left hand. -Massage to the right hand  -Right hand digit extension with the hand flat at the tabletop surface -Blue resistive foam block for Bilateral gross gripping, lateral pinch, and 3pt. Pinch strength -UE shoulder stabilization in supine with the shoulder flexed to 90 degrees with the elbow extended-formulating the alphabet    GOALS: Goals reviewed with patient? Yes  SHORT TERM GOALS: Target date: 07/07/2024     Pt. Will be independent with HEPs for UE strength, and coordination Baseline: 06/27/24: Independent; continue Eval: No current HEP Goal status: Ongoing   LONG TERM GOALS: Target date: 08/18/2024   To reduce pain by 3 grades on the pain scale in preparation for functional hand use during ADLs/IADLs Baseline: 06/27/24: 0/10 pain in the right hand. Pt.  reports discomfort intermittently with left 5th digit flexion. Eval:  5/10 pain in the right hand Goal status: Progressed; Achieved  2.  Pt. Will formulate a full composite fist in preparation for securely holding kitchen/cooking utensils. Baseline:06/27/24: Digit flexion to the [facility]: Right: 2nd: 4.5cm(2cm) 3rd: 2.5cm(0cm)  4th: 2cm (0cm) 5th: 5cm(2cm) Left: 2nd: 1cm(1cm) 3rd: 1.81m(1cm) 4th: 1.5cm(1cm) 5th: 5cm(1.5cm) Pt. Had difficulty with holding and using utensils during self-feeding, Pt. Is improving with larger cooking utensilsEval: Digit flexion to the [facility]:Mphyu: 2nd: 5cm(2cm) 3rd: 0cm  4th: 0cm 5th: 5cm(3cm) Left: 2nd: 1cm(1cm) 3rd: 2cm(0cm) 4th: 4cm(0cm) 5th: 5cm(3cm) Pt, has difficulty securely holding cooking utensils in her right hand Goal status: Progressing, Ongoing  3.  Pt. Will efficiently perform hand to mouth patterns during self-feeding with modified independence using her right hand. Baseline: 06/27/24: Pt. continues to have difficulty bringing her spoon all the way to her mouth, often needing to bring her mouth down to her spoon. Eval: Pt. Has difficulty completing hand to mouth patterns, often bringing her mouth down to meet the spoon 2/2 weakness in the RUE/hand Goal status: Progressing, Ongoing  4.  Pt. Will cut meat on her plate efficiently with modified independence Baseline: 06/27/24: Pt. Is improving with cutting meat, over continues to have difficulty cutting meat. Eval: Pt. Has difficulty cutting meat Goal status:  Progressing, Ongoing   5.  Pt. Will increase bilateral shoulder abduction by 10 degrees be able to efficiently perform hair care Baseline: 06/27/24: Shoulder abduction: Right: 109(120), Left: 892(887) pt. Is able to reach up further to her head, however has difficulty sustaining BUE in elevation while performing hair care. Eval: shoulder abduction: Right: 82(120), Left: 72(102) Goal status: Progressing  6.  Pt. Will write one sentence efficiently  with 75% in  preparation for being able to fill in crossword puzzles. Baseline: 06/27/24: Name only with 90% legibility; one sentence completed with 50% legibility Eval: writing legibility: 75% for name only. Goal status: INITIAL  ASSESSMENT:  CLINICAL IMPRESSION:  Pt. required assist and support at the bilateral UEs through all shoulder ROM tasks. Pt. was able to initiate reps of hand to mouth patterns with the RUE with a 2# cuff weight in place, however required the weight to be modified to a 1# cuff weight.  Pt. required support proximally at the right elbow through all motions.  Pt. required verbal cues, tactile cues, and cues for visual demonstration of proper technique when alternating digit movement to progress the 1 roll throughout her hand.  Pt. presented with difficulty coordinating her digits to move the roll through her hand. Pt. continues to benefit from OT services to work on improving right hand pain, and to improve overall BUE functioning in order to increase engagement of the UEs during ADLs, and IADL tasks.    PERFORMANCE DEFICITS: in functional skills including ADLs, IADLs, coordination, dexterity, ROM, strength, pain, Fine motor control, Gross motor control, and UE functional use, , and psychosocial skills including coping strategies, environmental adaptation, habits, interpersonal interactions, and routines and behaviors.   IMPAIRMENTS: are limiting patient from ADLs, IADLs, and leisure.   CO-MORBIDITIES: may have co-morbidities  that affects occupational performance. Patient will benefit from skilled OT to address above impairments and improve overall function.  MODIFICATION OR ASSISTANCE TO COMPLETE EVALUATION: Min-Moderate modification of tasks or assist with assess necessary to complete an evaluation.  OT OCCUPATIONAL PROFILE AND HISTORY: Detailed assessment: Review of records and additional review of physical, cognitive, psychosocial history related to current functional  performance.  CLINICAL DECISION MAKING: Moderate - several treatment options, min-mod task modification necessary  REHAB POTENTIAL: Good  EVALUATION COMPLEXITY: Moderate   PLAN:  OT FREQUENCY: 2x/week  OT DURATION: 12 weeks  PLANNED INTERVENTIONS: 97168 OT Re-evaluation, 97535 self care/ADL training, 02889 therapeutic exercise, 97530 therapeutic activity, 97112 neuromuscular re-education, 97140 manual therapy, 97018 paraffin, 02989 moist heat, 97010 cryotherapy, 97034 contrast bath, 97760 Orthotic Initial, 97763 Orthotic/Prosthetic subsequent, passive range of motion, balance training, functional mobility training, energy conservation, coping strategies training, patient/family education, and DME and/or AE instructions  RECOMMENDED OTHER SERVICES: PT for balance  CONSULTED AND AGREED WITH PLAN OF CARE: Patient  PLAN FOR NEXT SESSION:  Assess sensation, and hand function skills at the next visit, as well administer the MAM-20 outcome measure; treatment  Richardson Otter, MS, OTR/L  07/26/2024, 2:37 PM

## 2024-07-31 ENCOUNTER — Ambulatory Visit: Admitting: Occupational Therapy

## 2024-07-31 DIAGNOSIS — M6281 Muscle weakness (generalized): Secondary | ICD-10-CM | POA: Diagnosis not present

## 2024-07-31 NOTE — Therapy (Signed)
 Occupational Therapy Treatment Note   Patient Name: Maria Lowe MRN: [id number] DOB:10-29-1930, 89 y.o., female Today's Date: 07/31/2024   REFERRING PROVIDER: Katrinka Senior, MD  END OF SESSION:   OT End of Session - 07/31/24 1509     Visit Number 18    Number of Visits 24    Date for Recertification  08/17/24    OT Start Time 0930    OT Stop Time 1015    OT Time Calculation (min) 45 min    Activity Tolerance Patient tolerated treatment well    Behavior During Therapy WFL for tasks assessed/performed          Past Medical History:  Diagnosis Date   Anticoagulant long-term use    eliquis -- managed by cardiology   Arthritis    knees   CKD (chronic kidney disease), stage III (HCC)    CLL (chronic lymphocytic leukemia) (HCC) 11/27/2013   oncologist-- dr lonn;  initial dx 03/ 2015, no treatment, active survillance   Essential hypertension    followed by pcp   Full dentures    History of gastric ulcer    stomach ulcer when young   Lymphocytosis 2009   followed oncology   Permanent atrial fibrillation (HCC) 11/2020   cardiologist-- primary dr w. barbaraann and followed by atrial clinic;    dx 003/ 2022, atenolol  for rate control and on eliquis    Rectocele    Thrombocythemia 2009   related to CLL, followed by dr lonn   Urinary incontinence    Vaginal atrophy    Past Surgical History:  Procedure Laterality Date   APPENDECTOMY     1980s   CATARACT EXTRACTION W/ INTRAOCULAR LENS IMPLANT Bilateral 2020   RECTOCELE REPAIR N/A 08/25/2021   Procedure: POSTERIOR REPAIR (RECTOCELE);  Surgeon: Jertson, Jill Evelyn, MD;  Location: [facility];  Service: Gynecology;  Laterality: N/A;   TONSILLECTOMY AND ADENOIDECTOMY     age 30   Patient Active Problem List   Diagnosis Date Noted   Severe mitral regurgitation 05/23/2024   Deficiency anemia 04/21/2024   Dehydration 04/21/2024   Acute left-sided low back pain with left-sided sciatica 06/25/2022   H/O  rectocele repair 08/25/2021   Secondary hypercoagulable state 12/10/2020   Persistent atrial fibrillation (HCC) 11/29/2020   Hyperlipidemia, unspecified 04/07/2018   CKD (chronic kidney disease), stage III (HCC) 02/09/2018   Rash 02/09/2018   Seborrheic dermatitis 01/08/2017   Former smoker 09/30/2015   Rectocele 03/29/2015   Thrombocytopenia 11/26/2014   CLL (chronic lymphocytic leukemia) (HCC) 11/27/2013   Hyperglycemia 05/08/2013   Essential hypertension, benign 11/07/2012   Obesity, unspecified 11/07/2012    ONSET DATE: 01/2024  REFERRING DIAG: Right hand weakness  THERAPY DIAG:  Muscle weakness (generalized)  Rationale for Evaluation and Treatment: Rehabilitation  SUBJECTIVE:   SUBJECTIVE STATEMENT:  Pt. reports that they have scheduled her for PT.  Accompanied by self; with daughter in the reception area  PERTINENT HISTORY:  Pt. was referred for outpatient OT services for right hand pain. Pt. had received a cat bite approximately one week prior to her going on a trip to [place]. She was hospitalized in [place] for one month due to developing a Cat Bite Pasturella Infection. While in the hospital, Pt. reports that she was placed in an arm cast. Pt. reports that while she was in the hospital she was bedbound, was not allowed to get out of bed for the entire month, and became weak, and deconditioned. Pt. Reports that she does not  know why she was placed in a cast. [place] return to the [place] Pt. started home health OT and PT services, however OT services were limited due to staffing constraints. Pt. was then referred to outpatient OT services. PMHx includes: Severe mitral valve regurgitation, pericadrial, and pleural effusions, A-Fib, Chronic Lymphocytic Anemia, multifactorial anemia and CKD stage III, Diverticulosis, constipation, HTN, Hyperlipidemia, Prediabetes.  PRECAUTIONS: None  WEIGHT BEARING RESTRICTIONS: No  PAIN:  Are you having pain? 0/10  FALLS: Has patient fallen  in last 6 months? 1 fall in the last 6 months  LIVING ENVIRONMENT: Lives with: Resides with her daughter Lives in: House- one level Stairs: ramped entrance Has following equipment at home: Counselling psychologist, Environmental Consultant - 4 wheeled, shower chair, bed side commode, and Grab bars, hospital bed, elevated toilet  PLOF: Independent, living alone, managing a household-per report  PATIENT GOALS:  To be able to cut her meat  OBJECTIVE:  Note: Objective measures were completed at Evaluation unless otherwise noted.  HAND DOMINANCE: Right  ADLs:  Transfers/ambulation related to ADLs: Supervision with a rollator walker Eating: Pt. has difficulty with using utensils with the right hand, often bringing her head down to meet hand/utensil when eating. Pt. Has  weakness with bringing the cup to her mouth. Difficulty with cutting food. Grooming: Difficulty sustaining the BUE's in up long enough to perform hair care. Independent with oral care,  UB Dressing: Independent, requires increased time to complete. Pt. Does endorse having difficulty reaching up to put a shirt on over her head. LB Dressing:Reports being able to perform LE dressing, Uses slide-on footwear.  Toileting:Pt. Reports independently being able to perform toileting care needs using her left hand. (Of note, Pt. Reports that she has always used her left hand to perform toilet hygiene care.) Bathing: Independent, daughter provides supervision from outside the shower curtain. Daughter assists with drying her back due to limited reach Tub Shower transfers: Daughter provides supervision tub/shower t/f's   IADLs: Light housekeeping: Pt. reports that she has resumed some light housekeeping tasks, however this has been limited. Pt. Reports that she does not vacuum, or sweep due to rollator use. Meal Prep: Pt. Has resumed weekly cooking on Sundays. Reports having difficulty cutting onions, as well as difficulty holding and using a beater. Pt. Reports  that she uses a modified technique to hold kitchen/cooking utensils. Community mobility: Pt. Relies on family  and friends for transportation Medication management: Pt. Has difficulty manipulating medication efficiently with the right hand Handwriting: 50% legible for name only Leisure/Hobbies: Loves to cook; enjoys reading , and working crossword puzzles.  MOBILITY STATUS:  Supervision using a rollator  FUNCTIONAL OUTCOME MEASURES: 05/30/24: MAM-20: 11/20 items were completed. The following items Pt. indicated as a 2 -very hard to do: wringing out a towel, opening a wide mouth bottle-previously opened, cutting meat on a plate, tying shoe laces, zipping a jacket, and squeezing toothpaste on a toothbrush. The following items Pt. indicated as a 1-cannot do: opening a medication bottle with a childproof cap, cutting nails with a clipper, buttoning clothing, picking up a 1/2 full water pitcher, and writing 3-4 lines legibly.  UPPER EXTREMITY ROM:     ROM Right eval Right 06/27/24 Left eval Left 06/27/24  Shoulder flexion 130(150) Scaption 134(150)  100 scaption  Shoulder abduction 82(120) 109(120) 72(102) 107(112)  Shoulder adduction      Shoulder extension      Shoulder internal rotation      Shoulder external rotation  Elbow flexion [facility] WFL 148 WFL  Elbow extension [facility] WFL -60(-28) -35(-18)  Wrist flexion 26(60) 30(60) WFL WFL  Wrist extension 44(62) 46(62) WFL WFL  Wrist ulnar deviation      Wrist radial deviation      Wrist pronation      Wrist supination      (Blank rows = not tested)  Eval: Digit flexion to the [facility]:    Right: 2nd: 5cm(2cm) 3rd: 0cm  4th: 0cm 5th: 5cm(3cm)     Left: 2nd: 1cm(1cm) 3rd: 2cm(0cm) 4th: 4cm(0cm) 5th: 5cm(3cm)    06/27/24:  Digit flexion to the [facility]:    Right: 2nd: 4.5cm(2cm) 3rd: 2.5cm(0cm)  4th: 2cm (0cm) 5th: 5cm(2cm)     Left: 2nd: 1cm(1cm) 3rd: 1.51m(1cm) 4th: 1.5cm(1cm) 5th: 5cm(1.5cm)  UPPER EXTREMITY MMT:     MMT Right eval Right   06/27/24 Left eval Left 06/27/24  Shoulder flexion  3+/5  3-/5  Shoulder abduction 3-/5 3+/5 3-/5 3+/5  Shoulder adduction      Shoulder extension      Shoulder internal rotation      Shoulder external rotation      Middle trapezius      Lower trapezius      Elbow flexion 4/5 4/5 4/5 4/5  Elbow extension 4/5 4/5 2+/5 3-/5  Wrist flexion 3-/5 3-/5 4-/5 4-/5  Wrist extension 3-/5 3/5 4-/5 4-/5  Wrist ulnar deviation      Wrist radial deviation      Wrist pronation      Wrist supination      (Blank rows = not tested)  HAND FUNCTION: 05/30/24: Grip strength: Right: 8 lbs; Left: 12 lbs, Lateral pinch: Right: 4 lbs, Left: 3 lbs, and 3 point pinch: Right: 3 lbs, Left: 3 lbs  06/27/24: Grip strength: Right: 11 lbs; Left: 12 lbs, Lateral pinch: Right: 4 lbs, Left: 3 lbs, and 3 point pinch: Right: 3 lbs, Left: 3 lbs  COORDINATION:  COORDINATION: Eval:   9 Hole Peg test: Right: 48 sec; Left: 49 sec  06/27/24:  9 Hole Peg test: Right: 33 sec; Left: 49 sec  SENSATION: TBD   EDEMA: N/A    COGNITION: Overall cognitive status: Within functional limits for tasks assessed  VISION: Subjective report: No change from baseline  PERCEPTION: WFL                                                                                                                TREATMENT DATE: 07/31/24  Therapeutic Ex.:   -Performed AROM, followed by PROM to the end range for bilateral shoulder flexion, abduction, external rotation, horizontal abduction. -Pt. tolerated bilateral AROM/AAROM/PROM for right 2nd digit, and bilateral 5th digit MP, PIP, and DIP flexion to prepare for formulating a composite fist.  Therapeutic Activities:  -Pt. worked on bilateral UE functional reaching using the shape tower. Pt. grasped and moved the shapes through 1-2 vertical dowels of  progressively increasing heights. -Support was provided proximally and gradually tapered as needed.   PATIENT EDUCATION: Education  details:  digit ROM, UE strengthening Person educated: Patient Education method: Explanation, Demonstration, Tactile cues, and Verbal cues Education comprehension: verbalized understanding, returned demonstration, verbal cues required, tactile cues required, and needs further education  HOME EXERCISE PROGRAM:    -Bilateral elbow flexion, and extension with yellow theraband.  -Yellow theraputty strengthening  following a HEP through [employer]. -contrasting heat 3 min., followed by 1 min. Cold  for 1 min. Ending with heat for 3 min. To the right hand. (Pt. Prefers just heat at home), moist heat to the left hand. -Massage to the right hand  -Right hand digit extension with the hand flat at the tabletop surface -Blue resistive foam block for Bilateral gross gripping, lateral pinch, and 3pt. Pinch strength -UE shoulder stabilization in supine with the shoulder flexed to 90 degrees with the elbow extended-formulating the alphabet    GOALS: Goals reviewed with patient? Yes  SHORT TERM GOALS: Target date: 07/07/2024     Pt. Will be independent with HEPs for UE strength, and coordination Baseline: 06/27/24: Independent; continue Eval: No current HEP Goal status: Ongoing   LONG TERM GOALS: Target date: 08/18/2024   To reduce pain by 3 grades on the pain scale in preparation for functional hand use during ADLs/IADLs Baseline: 06/27/24: 0/10 pain in the right hand. Pt. reports discomfort intermittently with left 5th digit flexion. Eval:  5/10 pain in the right hand Goal status: Progressed; Achieved  2.  Pt. Will formulate a full composite fist in preparation for securely holding kitchen/cooking utensils. Baseline:06/27/24: Digit flexion to the [facility]: Right: 2nd: 4.5cm(2cm) 3rd: 2.5cm(0cm)  4th: 2cm (0cm) 5th: 5cm(2cm) Left: 2nd: 1cm(1cm) 3rd: 1.67m(1cm) 4th: 1.5cm(1cm) 5th: 5cm(1.5cm) Pt. Had difficulty with holding and using utensils during self-feeding, Pt. Is improving with larger cooking  utensilsEval: Digit flexion to the [facility]:Mphyu: 2nd: 5cm(2cm) 3rd: 0cm  4th: 0cm 5th: 5cm(3cm) Left: 2nd: 1cm(1cm) 3rd: 2cm(0cm) 4th: 4cm(0cm) 5th: 5cm(3cm) Pt, has difficulty securely holding cooking utensils in her right hand Goal status: Progressing, Ongoing  3.  Pt. Will efficiently perform hand to mouth patterns during self-feeding with modified independence using her right hand. Baseline: 06/27/24: Pt. continues to have difficulty bringing her spoon all the way to her mouth, often needing to bring her mouth down to her spoon. Eval: Pt. Has difficulty completing hand to mouth patterns, often bringing her mouth down to meet the spoon 2/2 weakness in the RUE/hand Goal status: Progressing, Ongoing  4.  Pt. Will cut meat on her plate efficiently with modified independence Baseline: 06/27/24: Pt. Is improving with cutting meat, over continues to have difficulty cutting meat. Eval: Pt. Has difficulty cutting meat Goal status:  Progressing, Ongoing   5.  Pt. Will increase bilateral shoulder abduction by 10 degrees be able to efficiently perform hair care Baseline: 06/27/24: Shoulder abduction: Right: 109(120), Left: 892(887) pt. Is able to reach up further to her head, however has difficulty sustaining BUE in elevation while performing hair care. Eval: shoulder abduction: Right: 82(120), Left: 72(102) Goal status: Progressing  6.  Pt. Will write one sentence efficiently  with 75% in preparation for being able to fill in crossword puzzles. Baseline: 06/27/24: Name only with 90% legibility; one sentence completed with 50% legibility Eval: writing legibility: 75% for name only. Goal status: INITIAL  ASSESSMENT:  CLINICAL IMPRESSION:  Pt. required increased assist proximally for more reps when reaching with the LUE. Pt. required fewer cues, and assist when reaching with the RUE. Pt. continues to present with limited AROM in bilateral 5th digit MP, PIP,  and DIP flexion. Pt. continues to require rest  breaks during the UE exercises. Pt. continues to benefit from OT services to work on improving right hand pain, and to improve overall BUE functioning in order to increase engagement of the UEs during ADLs, and IADL tasks.    PERFORMANCE DEFICITS: in functional skills including ADLs, IADLs, coordination, dexterity, ROM, strength, pain, Fine motor control, Gross motor control, and UE functional use, , and psychosocial skills including coping strategies, environmental adaptation, habits, interpersonal interactions, and routines and behaviors.   IMPAIRMENTS: are limiting patient from ADLs, IADLs, and leisure.   CO-MORBIDITIES: may have co-morbidities  that affects occupational performance. Patient will benefit from skilled OT to address above impairments and improve overall function.  MODIFICATION OR ASSISTANCE TO COMPLETE EVALUATION: Min-Moderate modification of tasks or assist with assess necessary to complete an evaluation.  OT OCCUPATIONAL PROFILE AND HISTORY: Detailed assessment: Review of records and additional review of physical, cognitive, psychosocial history related to current functional performance.  CLINICAL DECISION MAKING: Moderate - several treatment options, min-mod task modification necessary  REHAB POTENTIAL: Good  EVALUATION COMPLEXITY: Moderate   PLAN:  OT FREQUENCY: 2x/week  OT DURATION: 12 weeks  PLANNED INTERVENTIONS: 97168 OT Re-evaluation, 97535 self care/ADL training, 02889 therapeutic exercise, 97530 therapeutic activity, 97112 neuromuscular re-education, 97140 manual therapy, 97018 paraffin, 02989 moist heat, 97010 cryotherapy, 97034 contrast bath, 97760 Orthotic Initial, 97763 Orthotic/Prosthetic subsequent, passive range of motion, balance training, functional mobility training, energy conservation, coping strategies training, patient/family education, and DME and/or AE instructions  RECOMMENDED OTHER SERVICES: PT for balance  CONSULTED AND AGREED WITH PLAN OF  CARE: Patient  PLAN FOR NEXT SESSION:  Assess sensation, and hand function skills at the next visit, as well administer the MAM-20 outcome measure; treatment  Richardson Otter, MS, OTR/L  07/31/2024, 3:13 PM

## 2024-08-02 ENCOUNTER — Ambulatory Visit: Admitting: Occupational Therapy

## 2024-08-02 ENCOUNTER — Ambulatory Visit: Admitting: Physical Therapy

## 2024-08-02 DIAGNOSIS — M6281 Muscle weakness (generalized): Secondary | ICD-10-CM

## 2024-08-02 DIAGNOSIS — R262 Difficulty in walking, not elsewhere classified: Secondary | ICD-10-CM

## 2024-08-02 DIAGNOSIS — R2681 Unsteadiness on feet: Secondary | ICD-10-CM

## 2024-08-02 NOTE — Therapy (Signed)
 Occupational Therapy Treatment Note   Patient Name: Maria Lowe MRN: [id number] DOB:1930-11-22, 89 y.o., female Today's Date: 08/02/2024   REFERRING PROVIDER: Katrinka Senior, MD  END OF SESSION:   OT End of Session - 08/02/24 1131     Visit Number 19    Number of Visits 24    Date for Recertification  08/17/24    OT Start Time 1010    OT Stop Time 1055    OT Time Calculation (min) 45 min    Activity Tolerance Patient tolerated treatment well    Behavior During Therapy WFL for tasks assessed/performed          Past Medical History:  Diagnosis Date   Anticoagulant long-term use    eliquis -- managed by cardiology   Arthritis    knees   CKD (chronic kidney disease), stage III (HCC)    CLL (chronic lymphocytic leukemia) (HCC) 11/27/2013   oncologist-- dr lonn;  initial dx 03/ 2015, no treatment, active survillance   Essential hypertension    followed by pcp   Full dentures    History of gastric ulcer    stomach ulcer when young   Lymphocytosis 2009   followed oncology   Permanent atrial fibrillation (HCC) 11/2020   cardiologist-- primary dr w. barbaraann and followed by atrial clinic;    dx 003/ 2022, atenolol  for rate control and on eliquis    Rectocele    Thrombocythemia 2009   related to CLL, followed by dr lonn   Urinary incontinence    Vaginal atrophy    Past Surgical History:  Procedure Laterality Date   APPENDECTOMY     1980s   CATARACT EXTRACTION W/ INTRAOCULAR LENS IMPLANT Bilateral 2020   RECTOCELE REPAIR N/A 08/25/2021   Procedure: POSTERIOR REPAIR (RECTOCELE);  Surgeon: Jertson, Jill Evelyn, MD;  Location: [facility];  Service: Gynecology;  Laterality: N/A;   TONSILLECTOMY AND ADENOIDECTOMY     age 40   Patient Active Problem List   Diagnosis Date Noted   Severe mitral regurgitation 05/23/2024   Deficiency anemia 04/21/2024   Dehydration 04/21/2024   Acute left-sided low back pain with left-sided sciatica 06/25/2022   H/O  rectocele repair 08/25/2021   Secondary hypercoagulable state 12/10/2020   Persistent atrial fibrillation (HCC) 11/29/2020   Hyperlipidemia, unspecified 04/07/2018   CKD (chronic kidney disease), stage III (HCC) 02/09/2018   Rash 02/09/2018   Seborrheic dermatitis 01/08/2017   Former smoker 09/30/2015   Rectocele 03/29/2015   Thrombocytopenia 11/26/2014   CLL (chronic lymphocytic leukemia) (HCC) 11/27/2013   Hyperglycemia 05/08/2013   Essential hypertension, benign 11/07/2012   Obesity, unspecified 11/07/2012    ONSET DATE: 01/2024  REFERRING DIAG: Right hand weakness  THERAPY DIAG:  Muscle weakness (generalized)  Rationale for Evaluation and Treatment: Rehabilitation  SUBJECTIVE:   SUBJECTIVE STATEMENT:  Pt. reports that they have scheduled her for PT.  Accompanied by self; with daughter in the reception area  PERTINENT HISTORY:  Pt. was referred for outpatient OT services for right hand pain. Pt. had received a cat bite approximately one week prior to her going on a trip to [place]. She was hospitalized in [place] for one month due to developing a Cat Bite Pasturella Infection. While in the hospital, Pt. reports that she was placed in an arm cast. Pt. reports that while she was in the hospital she was bedbound, was not allowed to get out of bed for the entire month, and became weak, and deconditioned. Pt. Reports that she does not  know why she was placed in a cast. [place] the [place] Pt. started home health OT and PT services, however OT services were limited due to staffing constraints. Pt. was then referred to outpatient OT services. PMHx includes: Severe mitral valve regurgitation, pericadrial, and pleural effusions, A-Fib, Chronic Lymphocytic Anemia, multifactorial anemia and CKD stage III, Diverticulosis, constipation, HTN, Hyperlipidemia, Prediabetes.  PRECAUTIONS: None  WEIGHT BEARING RESTRICTIONS: No  PAIN:  Are you having pain? No pain. Does have  discomfort/stiffness in the UEs.  FALLS: Has patient fallen in last 6 months? 1 fall in the last 6 months  LIVING ENVIRONMENT: Lives with: Resides with her daughter Lives in: House- one level Stairs: ramped entrance Has following equipment at home: Counselling psychologist, Environmental Consultant - 4 wheeled, shower chair, bed side commode, and Grab bars, hospital bed, elevated toilet  PLOF: Independent, living alone, managing a household-per report  PATIENT GOALS:  To be able to cut her meat  OBJECTIVE:  Note: Objective measures were completed at Evaluation unless otherwise noted.  HAND DOMINANCE: Right  ADLs:  Transfers/ambulation related to ADLs: Supervision with a rollator walker Eating: Pt. has difficulty with using utensils with the right hand, often bringing her head down to meet hand/utensil when eating. Pt. Has  weakness with bringing the cup to her mouth. Difficulty with cutting food. Grooming: Difficulty sustaining the BUE's in up long enough to perform hair care. Independent with oral care,  UB Dressing: Independent, requires increased time to complete. Pt. Does endorse having difficulty reaching up to put a shirt on over her head. LB Dressing:Reports being able to perform LE dressing, Uses slide-on footwear.  Toileting:Pt. Reports independently being able to perform toileting care needs using her left hand. (Of note, Pt. Reports that she has always used her left hand to perform toilet hygiene care.) Bathing: Independent, daughter provides supervision from outside the shower curtain. Daughter assists with drying her back due to limited reach Tub Shower transfers: Daughter provides supervision tub/shower t/f's   IADLs: Light housekeeping: Pt. reports that she has resumed some light housekeeping tasks, however this has been limited. Pt. Reports that she does not vacuum, or sweep due to rollator use. Meal Prep: Pt. Has resumed weekly cooking on Sundays. Reports having difficulty cutting onions,  as well as difficulty holding and using a beater. Pt. Reports that she uses a modified technique to hold kitchen/cooking utensils. Community mobility: Pt. Relies on family  and friends for transportation Medication management: Pt. Has difficulty manipulating medication efficiently with the right hand Handwriting: 50% legible for name only Leisure/Hobbies: Loves to cook; enjoys reading , and working crossword puzzles.  MOBILITY STATUS:  Supervision using a rollator  FUNCTIONAL OUTCOME MEASURES: 05/30/24: MAM-20: 11/20 items were completed. The following items Pt. indicated as a 2 -very hard to do: wringing out a towel, opening a wide mouth bottle-previously opened, cutting meat on a plate, tying shoe laces, zipping a jacket, and squeezing toothpaste on a toothbrush. The following items Pt. indicated as a 1-cannot do: opening a medication bottle with a childproof cap, cutting nails with a clipper, buttoning clothing, picking up a 1/2 full water pitcher, and writing 3-4 lines legibly.  UPPER EXTREMITY ROM:     ROM Right eval Right 06/27/24 Left eval Left 06/27/24  Shoulder flexion 130(150) Scaption 134(150)  100 scaption  Shoulder abduction 82(120) 109(120) 72(102) 892(887)  Shoulder adduction      Shoulder extension      Shoulder internal rotation  Shoulder external rotation      Elbow flexion [facility] WFL 148 WFL  Elbow extension [facility] WFL -60(-28) -35(-18)  Wrist flexion 26(60) 30(60) WFL WFL  Wrist extension 44(62) 46(62) WFL WFL  Wrist ulnar deviation      Wrist radial deviation      Wrist pronation      Wrist supination      (Blank rows = not tested)  Eval: Digit flexion to the [facility]:    Right: 2nd: 5cm(2cm) 3rd: 0cm  4th: 0cm 5th: 5cm(3cm)     Left: 2nd: 1cm(1cm) 3rd: 2cm(0cm) 4th: 4cm(0cm) 5th: 5cm(3cm)    06/27/24:  Digit flexion to the [facility]:    Right: 2nd: 4.5cm(2cm) 3rd: 2.5cm(0cm)  4th: 2cm (0cm) 5th: 5cm(2cm)     Left: 2nd: 1cm(1cm) 3rd: 1.82m(1cm) 4th: 1.5cm(1cm) 5th:  5cm(1.5cm)  UPPER EXTREMITY MMT:     MMT Right eval Right  06/27/24 Left eval Left 06/27/24  Shoulder flexion  3+/5  3-/5  Shoulder abduction 3-/5 3+/5 3-/5 3+/5  Shoulder adduction      Shoulder extension      Shoulder internal rotation      Shoulder external rotation      Middle trapezius      Lower trapezius      Elbow flexion 4/5 4/5 4/5 4/5  Elbow extension 4/5 4/5 2+/5 3-/5  Wrist flexion 3-/5 3-/5 4-/5 4-/5  Wrist extension 3-/5 3/5 4-/5 4-/5  Wrist ulnar deviation      Wrist radial deviation      Wrist pronation      Wrist supination      (Blank rows = not tested)  HAND FUNCTION: 05/30/24: Grip strength: Right: 8 lbs; Left: 12 lbs, Lateral pinch: Right: 4 lbs, Left: 3 lbs, and 3 point pinch: Right: 3 lbs, Left: 3 lbs  06/27/24: Grip strength: Right: 11 lbs; Left: 12 lbs, Lateral pinch: Right: 4 lbs, Left: 3 lbs, and 3 point pinch: Right: 3 lbs, Left: 3 lbs  COORDINATION:  COORDINATION: Eval:   9 Hole Peg test: Right: 48 sec; Left: 49 sec  06/27/24:  9 Hole Peg test: Right: 33 sec; Left: 49 sec  SENSATION: TBD   EDEMA: N/A    COGNITION: Overall cognitive status: Within functional limits for tasks assessed  VISION: Subjective report: No change from baseline  PERCEPTION: WFL                                                                                                                TREATMENT DATE: 07/31/24  Therapeutic Ex.:   -Performed AROM, followed by PROM to the end range for bilateral shoulder flexion, abduction, external rotation, horizontal abduction. Emphasis was placed of slow prolonged gentle stretching 2/2 stiffness and soreness. -Pt. tolerated bilateral AROM/AAROM/PROM for right 2nd digit, and bilateral 5th digit MP, PIP, and DIP flexion to prepare for formulating a composite fist. -Bilateral gross grip strengthening with 15 # of grip strength resistive force. The gripper was set with 1 red, and 1 yellow resistive band. 1-2 sets  10  reps. Grip strengthening was performed  with the gripper in a vertical position, followed by horizontally.   Therapeutic Activities:  -Lateral, and 3pt. Pinch strengthening using yellow, red, green, and blue resistive clips with the right hand, as well as yellow, red, and green resistive clips for the left hand. -Pinch strengthening was performed in combination with reaching with support to discard them into a container.  PATIENT EDUCATION: Education details: digit ROM, UE strengthening Person educated: Patient Education method: Explanation, Demonstration, Tactile cues, and Verbal cues Education comprehension: verbalized understanding, returned demonstration, verbal cues required, tactile cues required, and needs further education  HOME EXERCISE PROGRAM:    -Bilateral elbow flexion, and extension with yellow theraband.  -Yellow theraputty strengthening  following a HEP through [facility]. -contrasting heat 3 min., followed by 1 min. Cold  for 1 min. Ending with heat for 3 min. To the right hand. (Pt. Prefers just heat at home), moist heat to the left hand. -Massage to the right hand  -Right hand digit extension with the hand flat at the tabletop surface -Blue resistive foam block for Bilateral gross gripping, lateral pinch, and 3pt. Pinch strength -UE shoulder stabilization in supine with the shoulder flexed to 90 degrees with the elbow extended-formulating the alphabet    GOALS: Goals reviewed with patient? Yes  SHORT TERM GOALS: Target date: 07/07/2024     Pt. Will be independent with HEPs for UE strength, and coordination Baseline: 06/27/24: Independent; continue Eval: No current HEP Goal status: Ongoing   LONG TERM GOALS: Target date: 08/18/2024   To reduce pain by 3 grades on the pain scale in preparation for functional hand use during ADLs/IADLs Baseline: 06/27/24: 0/10 pain in the right hand. Pt. reports discomfort intermittently with left 5th digit flexion. Eval:  5/10  pain in the right hand Goal status: Progressed; Achieved  2.  Pt. Will formulate a full composite fist in preparation for securely holding kitchen/cooking utensils. Baseline:06/27/24: Digit flexion to the [facility]: Right: 2nd: 4.5cm(2cm) 3rd: 2.5cm(0cm)  4th: 2cm (0cm) 5th: 5cm(2cm) Left: 2nd: 1cm(1cm) 3rd: 1.37m(1cm) 4th: 1.5cm(1cm) 5th: 5cm(1.5cm) Pt. Had difficulty with holding and using utensils during self-feeding, Pt. Is improving with larger cooking utensilsEval: Digit flexion to the [facility]:Mphyu: 2nd: 5cm(2cm) 3rd: 0cm  4th: 0cm 5th: 5cm(3cm) Left: 2nd: 1cm(1cm) 3rd: 2cm(0cm) 4th: 4cm(0cm) 5th: 5cm(3cm) Pt, has difficulty securely holding cooking utensils in her right hand Goal status: Progressing, Ongoing  3.  Pt. Will efficiently perform hand to mouth patterns during self-feeding with modified independence using her right hand. Baseline: 06/27/24: Pt. continues to have difficulty bringing her spoon all the way to her mouth, often needing to bring her mouth down to her spoon. Eval: Pt. Has difficulty completing hand to mouth patterns, often bringing her mouth down to meet the spoon 2/2 weakness in the RUE/hand Goal status: Progressing, Ongoing  4.  Pt. Will cut meat on her plate efficiently with modified independence Baseline: 06/27/24: Pt. Is improving with cutting meat, over continues to have difficulty cutting meat. Eval: Pt. Has difficulty cutting meat Goal status:  Progressing, Ongoing   5.  Pt. Will increase bilateral shoulder abduction by 10 degrees be able to efficiently perform hair care Baseline: 06/27/24: Shoulder abduction: Right: 109(120), Left: 892(887) pt. Is able to reach up further to her head, however has difficulty sustaining BUE in elevation while performing hair care. Eval: shoulder abduction: Right: 82(120), Left: 72(102) Goal status: Progressing  6.  Pt. Will write one sentence efficiently  with 75% in preparation  for being able to fill in crossword puzzles. Baseline:  06/27/24: Name only with 90% legibility; one sentence completed with 50% legibility Eval: writing legibility: 75% for name only. Goal status: INITIAL  ASSESSMENT:  CLINICAL IMPRESSION:  Upon arrival, Pt. reports discomfort/soreness/stiffness in the BUEs. Pt. responded well to the gentle stretching. Pt. Was more fatigued today following the PT evaluation. Pt. Continues to require assist proximally for the BUEs when reaching to place the clips into the container 2/2 fatigue. Pt. continues to present with limited strengthen in the BUEs, and limited AROM in bilateral 5th digit MP, PIP, and DIP flexion. Pt. continues to benefit from OT services to work on improving right hand pain, and to improve overall BUE functioning in order to increase engagement of the UEs during ADLs, and IADL tasks.    PERFORMANCE DEFICITS: in functional skills including ADLs, IADLs, coordination, dexterity, ROM, strength, pain, Fine motor control, Gross motor control, and UE functional use, , and psychosocial skills including coping strategies, environmental adaptation, habits, interpersonal interactions, and routines and behaviors.   IMPAIRMENTS: are limiting patient from ADLs, IADLs, and leisure.   CO-MORBIDITIES: may have co-morbidities  that affects occupational performance. Patient will benefit from skilled OT to address above impairments and improve overall function.  MODIFICATION OR ASSISTANCE TO COMPLETE EVALUATION: Min-Moderate modification of tasks or assist with assess necessary to complete an evaluation.  OT OCCUPATIONAL PROFILE AND HISTORY: Detailed assessment: Review of records and additional review of physical, cognitive, psychosocial history related to current functional performance.  CLINICAL DECISION MAKING: Moderate - several treatment options, min-mod task modification necessary  REHAB POTENTIAL: Good  EVALUATION COMPLEXITY: Moderate   PLAN:  OT FREQUENCY: 2x/week  OT DURATION: 12  weeks  PLANNED INTERVENTIONS: 97168 OT Re-evaluation, 97535 self care/ADL training, 02889 therapeutic exercise, 97530 therapeutic activity, 97112 neuromuscular re-education, 97140 manual therapy, 97018 paraffin, 02989 moist heat, 97010 cryotherapy, 97034 contrast bath, 97760 Orthotic Initial, 97763 Orthotic/Prosthetic subsequent, passive range of motion, balance training, functional mobility training, energy conservation, coping strategies training, patient/family education, and DME and/or AE instructions  RECOMMENDED OTHER SERVICES: PT for balance  CONSULTED AND AGREED WITH PLAN OF CARE: Patient  PLAN FOR NEXT SESSION:  Assess sensation, and hand function skills at the next visit, as well administer the MAM-20 outcome measure; treatment  Richardson Otter, MS, OTR/L  08/02/2024, 11:32 AM

## 2024-08-02 NOTE — Addendum Note (Signed)
 Addended by: EBB BRUCKNER B on: 08/02/2024 02:22 PM   Modules accepted: Orders

## 2024-08-02 NOTE — Therapy (Signed)
 OUTPATIENT PHYSICAL THERAPY NEURO EVALUATION   Patient Name: Maria Lowe MRN: [id number] DOB:1931/01/11, 89 y.o., female Today's Date: 08/02/2024   PCP: Garnette KIDD. Katrinka, MD REFERRING PROVIDER: Garnette KIDD. Katrinka, MD  END OF SESSION:  PT End of Session - 08/02/24 0918     Visit Number 1    Number of Visits 24    Date for Recertification  10/25/24    PT Start Time 0920    PT Stop Time 1005    PT Time Calculation (min) 45 min    Equipment Utilized During Treatment Gait belt    Activity Tolerance Patient tolerated treatment well    Behavior During Therapy WFL for tasks assessed/performed          Past Medical History:  Diagnosis Date   Anticoagulant long-term use    eliquis -- managed by cardiology   Arthritis    knees   CKD (chronic kidney disease), stage III (HCC)    CLL (chronic lymphocytic leukemia) (HCC) 11/27/2013   oncologist-- dr lonn;  initial dx 03/ 2015, no treatment, active survillance   Essential hypertension    followed by pcp   Full dentures    History of gastric ulcer    stomach ulcer when young   Lymphocytosis 2009   followed oncology   Permanent atrial fibrillation (HCC) 11/2020   cardiologist-- primary dr w. barbaraann and followed by atrial clinic;    dx 003/ 2022, atenolol  for rate control and on eliquis    Rectocele    Thrombocythemia 2009   related to CLL, followed by dr lonn   Urinary incontinence    Vaginal atrophy    Past Surgical History:  Procedure Laterality Date   APPENDECTOMY     1980s   CATARACT EXTRACTION W/ INTRAOCULAR LENS IMPLANT Bilateral 2020   RECTOCELE REPAIR N/A 08/25/2021   Procedure: POSTERIOR REPAIR (RECTOCELE);  Surgeon: Jertson, Jill Evelyn, MD;  Location: [facility];  Service: Gynecology;  Laterality: N/A;   TONSILLECTOMY AND ADENOIDECTOMY     age 53   Patient Active Problem List   Diagnosis Date Noted   Severe mitral regurgitation 05/23/2024   Deficiency anemia 04/21/2024   Dehydration  04/21/2024   Acute left-sided low back pain with left-sided sciatica 06/25/2022   H/O rectocele repair 08/25/2021   Secondary hypercoagulable state 12/10/2020   Persistent atrial fibrillation (HCC) 11/29/2020   Hyperlipidemia, unspecified 04/07/2018   CKD (chronic kidney disease), stage III (HCC) 02/09/2018   Rash 02/09/2018   Seborrheic dermatitis 01/08/2017   Former smoker 09/30/2015   Rectocele 03/29/2015   Thrombocytopenia 11/26/2014   CLL (chronic lymphocytic leukemia) (HCC) 11/27/2013   Hyperglycemia 05/08/2013   Essential hypertension, benign 11/07/2012   Obesity, unspecified 11/07/2012    ONSET DATE: May 2025  REFERRING DIAG: Severe muscle deconditioning  THERAPY DIAG:  Muscle weakness (generalized)  Unsteadiness on feet  Difficulty in walking, not elsewhere classified  Rationale for Evaluation and Treatment: Rehabilitation  SUBJECTIVE:  SUBJECTIVE STATEMENT: Pt reports she is doing well. Her and her daughter discuss wanting to be able to navigate stairs and ambulating with LRAD.  Pt accompanied by: family member (daughter)  PERTINENT HISTORY:  Pt was referred to outpatient PT for muscle deconditioning. Pt experienced a cat bite that required hospitalization while she was in [place]. Daughter states she was quite ill and was on bed rest for 3 weeks, which she states contributed to her decline in function. Prior to hospitalization patient was ambulating with a cane and independent with all activities. Currently she ambulates with a rollator, but would like to work toward ambulating with a cane. Pt enjoys reading and cooking. Her goals are to ambulated with LRAD, navigate stairs with confidence, and increase tolerance for cooking.   PAIN:  Are you having pain? Occasional arthritic  pain, primarily right knee  PRECAUTIONS: None  RED FLAGS: None   WEIGHT BEARING RESTRICTIONS: No  FALLS: Has patient fallen in last 6 months? Yes. Number of falls 3   LIVING ENVIRONMENT: Lives with: lives with their daughter Lives in: House/apartment Stairs: Yes; two steps at daughters house/ currently ramped Has following equipment at home: Counselling psychologist, Environmental Consultant - 4 wheeled, shower chair, Grab bars, and hospital bed  PLOF: Independent with basic ADLs   PATIENT GOALS: Pt would like to work on getting in and out of chairs, increasing tolerance for cooking, and using LRAD.    OBJECTIVE:  Note: Objective measures were completed at Evaluation unless otherwise noted.   COGNITION: Overall cognitive status: Within functional limits for tasks assessed    LOWER EXTREMITY ROM:     Active  Right Eval Left Eval  Hip flexion    Hip extension    Hip abduction    Hip adduction    Hip internal rotation    Hip external rotation    Knee flexion    Knee extension    Ankle dorsiflexion    Ankle plantarflexion    Ankle inversion    Ankle eversion     (Blank rows = not tested)  LOWER EXTREMITY MMT:    MMT Right Eval Left Eval  Hip flexion 4+ 4+  Hip extension    Hip abduction 3+ 3+  Hip adduction 3+ 3+  Hip internal rotation    Hip external rotation    Knee flexion 4+ 4+  Knee extension 4+ 4+  Ankle dorsiflexion 4 4  Ankle plantarflexion 3+ 3+  Ankle inversion    Ankle eversion    (Blank rows = not tested)  BED MOBILITY:  Pt reports no difficulty with bed mobility, but utilizes a hospital bed  RAMP:  Pt reports unsteadiness with ramps  STAIRS: To be assessed at next visit GAIT: Findings: Distance walked: 644ft, Assistive device utilized:Walker - 4 wheeled  FUNCTIONAL TESTS:  5 times sit to stand: 16.95 sec completed with UE support Timed up and go (TUG): 23.59 sec completed w/ 5TT 10 meter walk test: 16.34 sec; 0.61 m/s completed w/ 5TT 6 minute walk  test: completed 5:07 min, 612 ft completed w/ 5TT  PATIENT SURVEYS:  ABC scale: 37.5%  TREATMENT DATE: 08/02/24   SELF CARE  Patient instructed in plan of care, findings for evaluation, and ways of physical therapy may improve their function and quality of life.    PATIENT EDUCATION: Education details: POC Person educated: Patient Education method: Explanation Education comprehension: verbalized understanding   HOME EXERCISE PROGRAM: Establish visit 2     GOALS: Goals reviewed with patient? Yes  SHORT TERM GOALS: Target date: 09/13/2024  Patient will be independent with HEP to improve strength and mobility for better functional independence with ADL's Baseline: Establish at second visit Goal status: INITIAL   LONG TERM GOALS: Target date: 10/25/2024  Patient will improve 5xSTS to 12 seconds or less to demonstrate improved strength and balance. Baseline: 16.95 sec with UE support Goal status: INITIAL  2.  Patient will decrease TUG score by 8 seconds or more with LRAD to indicate decreased fall risk and improved transfer ability. Baseline:  23.59 sec completed with 4WW Goal status: INITIAL  3.  Patient will improve by >1.0 m/s with LRAD for improved community ambulation and indicate a decreased fall risk. Baseline: 0.61 m/s completed with 4WW Goal status: INITIAL  4.  Patient will improve to >800 ft with LRAD for progression toward community ambulator.  Baseline: 5:07 min, 612 ft. Completed with 4WW Goal status: INITIAL  5.  Patient will improve BERG by 6 points or more to demonstrate decreased fall risk with functional activities.  Baseline: To be assessed at visit 2 Goal status: INITIAL  6.  Patient will be able to navigate 4 steps with step to pattern using UE support and supervision in order to enter daughter's home.  Baseline:  to be assessed at visit 2 Goal status: INITIAL   ASSESSMENT:  CLINICAL IMPRESSION:  Patient is a 89 y.o. female who was seen today for physical therapy evaluation and treatment for generalized weakness. Pt completed 5XSTS, TUG, , , and ABC scale. All tests indicate increased risk for falls, decreased endurance, reduced strength, and limited balance confidence. Previously patient was independent with all activities and ambulating with a cane. She has been completing OT HEP, and demonstrates good motivation to work toward goals of ambulating with LRAD and navigating stairs which she describes terrifies her. Pt will benefit from skilled physical therapy intervention to address impairments, improve QOL, and attain therapy goals.    OBJECTIVE IMPAIRMENTS: Abnormal gait, decreased activity tolerance, decreased balance, decreased endurance, decreased knowledge of use of DME, and decreased strength.   ACTIVITY LIMITATIONS: carrying, lifting, squatting, stairs, transfers, and reach over head  PARTICIPATION LIMITATIONS: meal prep, cleaning, laundry, and community activity  PERSONAL FACTORS: Age and 3+ comorbidities: CKD, CLL, HTN are also affecting patient's functional outcome.   REHAB POTENTIAL: Good  CLINICAL DECISION MAKING: Evolving/moderate complexity  EVALUATION COMPLEXITY: Moderate  PLAN:  PT FREQUENCY: 2x/week  PT DURATION: 12 weeks  PLANNED INTERVENTIONS: 97110-Therapeutic exercises, 97530- Therapeutic activity, 97112- Neuromuscular re-education, 97535- Self Care, [id number]- Manual therapy, [phone]- Gait training, Patient/Family education, Balance training, Stair training, Vestibular training, and DME instructions  PLAN FOR NEXT SESSION:  - Establish HEP - BERG - Stair assessment    Leonor Rode, SPT

## 2024-08-07 ENCOUNTER — Ambulatory Visit: Admitting: Occupational Therapy

## 2024-08-07 ENCOUNTER — Ambulatory Visit

## 2024-08-07 DIAGNOSIS — R278 Other lack of coordination: Secondary | ICD-10-CM

## 2024-08-07 DIAGNOSIS — M6281 Muscle weakness (generalized): Secondary | ICD-10-CM

## 2024-08-07 DIAGNOSIS — R262 Difficulty in walking, not elsewhere classified: Secondary | ICD-10-CM

## 2024-08-07 DIAGNOSIS — R2681 Unsteadiness on feet: Secondary | ICD-10-CM

## 2024-08-07 NOTE — Therapy (Signed)
 OUTPATIENT PHYSICAL THERAPY NEURO TREATMENT   Patient Name: Maria Lowe MRN: [id number] DOB:Aug 23, 1931, 89 y.o., female Today's Date: 08/07/2024   PCP: Garnette KIDD. Katrinka, MD REFERRING PROVIDER: Garnette KIDD. Katrinka, MD  END OF SESSION:  PT End of Session - 08/07/24 1458     Visit Number 2    Number of Visits 24    Date for Recertification  10/25/24    PT Start Time 1451    PT Stop Time 1530    PT Time Calculation (min) 39 min    Equipment Utilized During Treatment Gait belt    Activity Tolerance Patient tolerated treatment well    Behavior During Therapy WFL for tasks assessed/performed         Past Medical History:  Diagnosis Date   Anticoagulant long-term use    eliquis -- managed by cardiology   Arthritis    knees   CKD (chronic kidney disease), stage III (HCC)    CLL (chronic lymphocytic leukemia) (HCC) 11/27/2013   oncologist-- dr lonn;  initial dx 03/ 2015, no treatment, active survillance   Essential hypertension    followed by pcp   Full dentures    History of gastric ulcer    stomach ulcer when young   Lymphocytosis 2009   followed oncology   Permanent atrial fibrillation (HCC) 11/2020   cardiologist-- primary dr w. barbaraann and followed by atrial clinic;    dx 003/ 2022, atenolol  for rate control and on eliquis    Rectocele    Thrombocythemia 2009   related to CLL, followed by dr lonn   Urinary incontinence    Vaginal atrophy    Past Surgical History:  Procedure Laterality Date   APPENDECTOMY     1980s   CATARACT EXTRACTION W/ INTRAOCULAR LENS IMPLANT Bilateral 2020   RECTOCELE REPAIR N/A 08/25/2021   Procedure: POSTERIOR REPAIR (RECTOCELE);  Surgeon: Jertson, Jill Evelyn, MD;  Location: [facility];  Service: Gynecology;  Laterality: N/A;   TONSILLECTOMY AND ADENOIDECTOMY     age 52   Patient Active Problem List   Diagnosis Date Noted   Severe mitral regurgitation 05/23/2024   Deficiency anemia 04/21/2024   Dehydration  04/21/2024   Acute left-sided low back pain with left-sided sciatica 06/25/2022   H/O rectocele repair 08/25/2021   Secondary hypercoagulable state 12/10/2020   Persistent atrial fibrillation (HCC) 11/29/2020   Hyperlipidemia, unspecified 04/07/2018   CKD (chronic kidney disease), stage III (HCC) 02/09/2018   Rash 02/09/2018   Seborrheic dermatitis 01/08/2017   Former smoker 09/30/2015   Rectocele 03/29/2015   Thrombocytopenia 11/26/2014   CLL (chronic lymphocytic leukemia) (HCC) 11/27/2013   Hyperglycemia 05/08/2013   Essential hypertension, benign 11/07/2012   Obesity, unspecified 11/07/2012    ONSET DATE: May 2025  REFERRING DIAG: Severe muscle deconditioning  THERAPY DIAG:  Muscle weakness (generalized)  Unsteadiness on feet  Difficulty in walking, not elsewhere classified  Other lack of coordination  Rationale for Evaluation and Treatment: Rehabilitation  SUBJECTIVE:  SUBJECTIVE STATEMENT:  Pt reports she is doing well, and she is worried about going to Thanksgiving.    Pt accompanied by: family member (daughter)  PERTINENT HISTORY:  Pt was referred to outpatient PT for muscle deconditioning. Pt experienced a cat bite that required hospitalization while she was in [place]. Daughter states she was quite ill and was on bed rest for 3 weeks, which she states contributed to her decline in function. Prior to hospitalization patient was ambulating with a cane and independent with all activities. Currently she ambulates with a rollator, but would like to work toward ambulating with a cane. Pt enjoys reading and cooking. Her goals are to ambulated with LRAD, navigate stairs with confidence, and increase tolerance for cooking.   PAIN:  Are you having pain? Occasional arthritic pain, primarily  right knee  PRECAUTIONS: None  RED FLAGS: None   WEIGHT BEARING RESTRICTIONS: No  FALLS: Has patient fallen in last 6 months? Yes. Number of falls 3   LIVING ENVIRONMENT: Lives with: lives with their daughter Lives in: House/apartment Stairs: Yes; two steps at daughters house/ currently ramped Has following equipment at home: Counselling psychologist, Environmental Consultant - 4 wheeled, shower chair, Grab bars, and hospital bed  PLOF: Independent with basic ADLs   PATIENT GOALS: Pt would like to work on getting in and out of chairs, increasing tolerance for cooking, and using LRAD.    OBJECTIVE:  Note: Objective measures were completed at Evaluation unless otherwise noted.   COGNITION: Overall cognitive status: Within functional limits for tasks assessed    LOWER EXTREMITY ROM:     Active  Right Eval Left Eval  Hip flexion    Hip extension    Hip abduction    Hip adduction    Hip internal rotation    Hip external rotation    Knee flexion    Knee extension    Ankle dorsiflexion    Ankle plantarflexion    Ankle inversion    Ankle eversion     (Blank rows = not tested)  LOWER EXTREMITY MMT:    MMT Right Eval Left Eval  Hip flexion 4+ 4+  Hip extension    Hip abduction 3+ 3+  Hip adduction 3+ 3+  Hip internal rotation    Hip external rotation    Knee flexion 4+ 4+  Knee extension 4+ 4+  Ankle dorsiflexion 4 4  Ankle plantarflexion 3+ 3+  Ankle inversion    Ankle eversion    (Blank rows = not tested)  BED MOBILITY:  Pt reports no difficulty with bed mobility, but utilizes a hospital bed  RAMP:  Pt reports unsteadiness with ramps  STAIRS: To be assessed at next visit GAIT: Findings: Distance walked: 649ft, Assistive device utilized:Walker - 4 wheeled  FUNCTIONAL TESTS:  5 times sit to stand: 16.95 sec completed with UE support Timed up and go (TUG): 23.59 sec completed w/ 5TT 10 meter walk test: 16.34 sec; 0.61 m/s completed w/ 5TT 6 minute walk test: completed  5:07 min, 612 ft completed w/ 5TT Item Test date: 08/07/24  Sitting to standing 2. able to stand using hands after several tries  2. Standing unsupported 4. able to stand safely for 2 minutes  3. Sitting with back unsupported, feet supported 4. able to sit safely and securely for 2 minutes  4. Standing to sitting 4. sits safely with minimal use of hands  5. Pivot transfer  3. able to transfer safely with definite need of hands  6.  Standing unsupported with eyes closed 3. able to stand 10 seconds with supervision  7. Standing unsupported with feet together 1. needs help to attain position but able to stand 15 seconds feet together  8. Reaching forward with outstretched arms while standing 2. can reach forward 5 cm (2 inches)  9. Pick up object from the floor from standing 1. unable to pick up and needs supervision while trying  10. Turning to look behind over left and right shoulders while standing 1. needs supervision when turning  11. Turn 360 degrees 1. needs close supervision or verbal cuing  12. Place alternate foot on step or stool while standing unsupported 0. needs assistance to keep from falling/unable to try  13. Standing unsupported one foot in front 0. loses balance while stepping or standing  14. Standing on one leg 0. unable to try of needs assist to prevent fall    Total Score 26/56     PATIENT SURVEYS:  ABC scale: 37.5%                                                                                                                              TREATMENT DATE: 08/07/24   TherAct: To improve functional movements patterns for everyday tasks  Stair training at steps, with use of B railing on the first 2 attempts, then utilizing single railing on the R side going up to mimic potential house for thanksgiving.  Pt able to come down sideways with good technique utilizing B UE support.    TherEx: To improve strength, endurance, mobility, and function of specific targeted muscle  groups or improve joint range of motion or improve muscle flexibility  Standing hip abduction, 2x10 each LE Standing hip extension, 2x10 each LE Standing hamstring curls, 2x10 each LE Standing marches, 2x10 each LE Standing heel raises, 2x10   Neuromuscular Re-Ed: To facilitate reeducation of movement, balance, posture, coordination, and/or proprioception/kinesthetic sense.  Performed and assessed BERG with results given to the pt and explained the rationale for the test and it signifying the overall fall risk.      PATIENT EDUCATION: Education details: POC Person educated: Patient Education method: Explanation Education comprehension: verbalized understanding   HOME EXERCISE PROGRAM: Establish visit 2     GOALS: Goals reviewed with patient? Yes  SHORT TERM GOALS: Target date: 09/13/2024  Patient will be independent with HEP to improve strength and mobility for better functional independence with ADL's Baseline: Establish at second visit Goal status: INITIAL   LONG TERM GOALS: Target date: 10/25/2024  Patient will improve 5xSTS to 12 seconds or less to demonstrate improved strength and balance. Baseline: 16.95 sec with UE support Goal status: INITIAL  2.  Patient will decrease TUG score by 8 seconds or more with LRAD to indicate decreased fall risk and improved transfer ability. Baseline:  23.59 sec completed with 4WW Goal status: INITIAL  3.  Patient will improve by >1.0 m/s with LRAD for improved  community ambulation and indicate a decreased fall risk. Baseline: 0.61 m/s completed with 4WW Goal status: INITIAL  4.  Patient will improve to >800 ft with LRAD for progression toward community ambulator.  Baseline: 5:07 min, 612 ft. Completed with 4WW Goal status: INITIAL  5.  Patient will improve BERG by 6 points or more to demonstrate decreased fall risk with functional activities.  Baseline: To be assessed at visit 2 Goal status: INITIAL  6.  Patient  will be able to navigate 4 steps with step to pattern using UE support and supervision in order to enter daughter's home.  Baseline: to be assessed at visit 2 Goal status: INITIAL   ASSESSMENT:  CLINICAL IMPRESSION:  Pt responded well to the exercises and stair training.  Pt is still concerned with being able to meet her family for Thanksgiving due to the stairs that are present at her family member's house.  Pt performs well, but may need another person with her just for support depending on the railing system that is present.  Pt encouraged to perform the exercises given to her as a HEP, with pt voicing understanding and agreeable to performing.   Pt will continue to benefit from skilled therapy to address remaining deficits in order to improve overall QoL and return to PLOF.       OBJECTIVE IMPAIRMENTS: Abnormal gait, decreased activity tolerance, decreased balance, decreased endurance, decreased knowledge of use of DME, and decreased strength.   ACTIVITY LIMITATIONS: carrying, lifting, squatting, stairs, transfers, and reach over head  PARTICIPATION LIMITATIONS: meal prep, cleaning, laundry, and community activity  PERSONAL FACTORS: Age and 3+ comorbidities: CKD, CLL, HTN are also affecting patient's functional outcome.   REHAB POTENTIAL: Good  CLINICAL DECISION MAKING: Evolving/moderate complexity  EVALUATION COMPLEXITY: Moderate  PLAN:  PT FREQUENCY: 2x/week  PT DURATION: 12 weeks  PLANNED INTERVENTIONS: 97110-Therapeutic exercises, 97530- Therapeutic activity, 97112- Neuromuscular re-education, 97535- Self Care, 02859- Manual therapy, [phone]- Gait training, Patient/Family education, Balance training, Stair training, Vestibular training, and DME instructions  PLAN FOR NEXT SESSION:   Continue with balance assessment and strength testing Stair navigation training for Thanksgiving   Fonda Simpers, PT, DPT Physical Therapist - [facility]  08/07/24, 4:00 PM

## 2024-08-07 NOTE — Therapy (Signed)
 Occupational Therapy Progress/Recertification Note  Dates of reporting period  06/27/24   to   08/07/24    Patient Name: Maria Lowe MRN: [id number] DOB:April 04, 1931, 89 y.o., female Today's Date: 08/07/2024   REFERRING PROVIDER: Katrinka Senior, MD  END OF SESSION:   OT End of Session - 08/07/24 1452     Visit Number 20    Number of Visits 48    Date for Recertification  10/30/24    OT Start Time 1400    OT Stop Time 1445    OT Time Calculation (min) 45 min    Activity Tolerance Patient tolerated treatment well    Behavior During Therapy WFL for tasks assessed/performed           Past Medical History:  Diagnosis Date   Anticoagulant long-term use    eliquis -- managed by cardiology   Arthritis    knees   CKD (chronic kidney disease), stage III (HCC)    CLL (chronic lymphocytic leukemia) (HCC) 11/27/2013   oncologist-- dr lonn;  initial dx 03/ 2015, no treatment, active survillance   Essential hypertension    followed by pcp   Full dentures    History of gastric ulcer    stomach ulcer when young   Lymphocytosis 2009   followed oncology   Permanent atrial fibrillation (HCC) 11/2020   cardiologist-- primary dr w. barbaraann and followed by atrial clinic;    dx 003/ 2022, atenolol  for rate control and on eliquis    Rectocele    Thrombocythemia 2009   related to CLL, followed by dr lonn   Urinary incontinence    Vaginal atrophy    Past Surgical History:  Procedure Laterality Date   APPENDECTOMY     1980s   CATARACT EXTRACTION W/ INTRAOCULAR LENS IMPLANT Bilateral 2020   RECTOCELE REPAIR N/A 08/25/2021   Procedure: POSTERIOR REPAIR (RECTOCELE);  Surgeon: Jertson, Jill Evelyn, MD;  Location: [facility];  Service: Gynecology;  Laterality: N/A;   TONSILLECTOMY AND ADENOIDECTOMY     age 19   Patient Active Problem List   Diagnosis Date Noted   Severe mitral regurgitation 05/23/2024   Deficiency anemia 04/21/2024   Dehydration 04/21/2024    Acute left-sided low back pain with left-sided sciatica 06/25/2022   H/O rectocele repair 08/25/2021   Secondary hypercoagulable state 12/10/2020   Persistent atrial fibrillation (HCC) 11/29/2020   Hyperlipidemia, unspecified 04/07/2018   CKD (chronic kidney disease), stage III (HCC) 02/09/2018   Rash 02/09/2018   Seborrheic dermatitis 01/08/2017   Former smoker 09/30/2015   Rectocele 03/29/2015   Thrombocytopenia 11/26/2014   CLL (chronic lymphocytic leukemia) (HCC) 11/27/2013   Hyperglycemia 05/08/2013   Essential hypertension, benign 11/07/2012   Obesity, unspecified 11/07/2012    ONSET DATE: 01/2024  REFERRING DIAG: Right hand weakness  THERAPY DIAG:  Muscle weakness (generalized)  Rationale for Evaluation and Treatment: Rehabilitation  SUBJECTIVE:   SUBJECTIVE STATEMENT:  Pt. reports that her hands wouldn't move over the weekend  Accompanied by self; with daughter in the reception area  PERTINENT HISTORY:  Pt. was referred for outpatient OT services for right hand pain. Pt. had received a cat bite approximately one week prior to her going on a trip to [place]. She was hospitalized in [place] for one month due to developing a Cat Bite Pasturella Infection. While in the hospital, Pt. reports that she was placed in an arm cast. Pt. reports that while she was in the hospital she was bedbound, was not allowed to get out of  bed for the entire month, and became weak, and deconditioned. Pt. Reports that she does not know why she was placed in a cast. [place] the [place] Pt. started home health OT and PT services, however OT services were limited due to staffing constraints. Pt. was then referred to outpatient OT services. PMHx includes: Severe mitral valve regurgitation, pericadrial, and pleural effusions, A-Fib, Chronic Lymphocytic Anemia, multifactorial anemia and CKD stage III, Diverticulosis, constipation, HTN, Hyperlipidemia, Prediabetes.  PRECAUTIONS: None  WEIGHT BEARING  RESTRICTIONS: No  PAIN:  Are you having pain? No pain. Does have discomfort/stiffness in the UEs.  FALLS: Has patient fallen in last 6 months? 1 fall in the last 6 months  LIVING ENVIRONMENT: Lives with: Resides with her daughter Lives in: House- one level Stairs: ramped entrance Has following equipment at home: Counselling psychologist, Environmental Consultant - 4 wheeled, shower chair, bed side commode, and Grab bars, hospital bed, elevated toilet  PLOF: Independent, living alone, managing a household-per report  PATIENT GOALS:  To be able to cut her meat  OBJECTIVE:  Note: Objective measures were completed at Evaluation unless otherwise noted.  HAND DOMINANCE: Right  ADLs:  Transfers/ambulation related to ADLs: Supervision with a rollator walker Eating: Pt. has difficulty with using utensils with the right hand, often bringing her head down to meet hand/utensil when eating. Pt. Has  weakness with bringing the cup to her mouth. Difficulty with cutting food. Grooming: Difficulty sustaining the BUE's in up long enough to perform hair care. Independent with oral care,  UB Dressing: Independent, requires increased time to complete. Pt. Does endorse having difficulty reaching up to put a shirt on over her head. LB Dressing:Reports being able to perform LE dressing, Uses slide-on footwear.  Toileting:Pt. Reports independently being able to perform toileting care needs using her left hand. (Of note, Pt. Reports that she has always used her left hand to perform toilet hygiene care.) Bathing: Independent, daughter provides supervision from outside the shower curtain. Daughter assists with drying her back due to limited reach Tub Shower transfers: Daughter provides supervision tub/shower t/f's   IADLs: Light housekeeping: Pt. reports that she has resumed some light housekeeping tasks, however this has been limited. Pt. Reports that she does not vacuum, or sweep due to rollator use. Meal Prep: Pt. Has resumed  weekly cooking on Sundays. Reports having difficulty cutting onions, as well as difficulty holding and using a beater. Pt. Reports that she uses a modified technique to hold kitchen/cooking utensils. Community mobility: Pt. Relies on family  and friends for transportation Medication management: Pt. Has difficulty manipulating medication efficiently with the right hand Handwriting: 50% legible for name only Leisure/Hobbies: Loves to cook; enjoys reading , and working crossword puzzles.  MOBILITY STATUS:  Supervision using a rollator  FUNCTIONAL OUTCOME MEASURES: 05/30/24: MAM-20: 11/20 items were completed. The following items Pt. indicated as a 2 -very hard to do: wringing out a towel, opening a wide mouth bottle-previously opened, cutting meat on a plate, tying shoe laces, zipping a jacket, and squeezing toothpaste on a toothbrush. The following items Pt. indicated as a 1-cannot do: opening a medication bottle with a childproof cap, cutting nails with a clipper, buttoning clothing, picking up a 1/2 full water pitcher, and writing 3-4 lines legibly.  UPPER EXTREMITY ROM:     ROM Right eval Right 06/27/24 Right 08/07/24 Left eval Left 06/27/24 Left 08/07/24  Shoulder flexion 130(150) Scaption 134(150) 144(150)  100 scaption 100 scaption (120) flexion  Shoulder abduction 82(120) 109(120)  [id number]) [phone])  Shoulder adduction        Shoulder extension        Shoulder internal rotation        Shoulder external rotation        Elbow flexion [facility] WFL 148 WFL WFL  Elbow extension [facility] WFL  -60(-28) -35(-18) -26(-13)  Wrist flexion 26(60) 30(60) 36(60) WFL WFL 60(70)  Wrist extension 44(62) 46(62) 52(62) WFL WFL 70  Wrist ulnar deviation        Wrist radial deviation        Wrist pronation        Wrist supination        (Blank rows = not tested)  Eval: Digit flexion to the [facility]:    Right: 2nd: 5cm(2cm) 3rd: 0cm  4th: 0cm 5th: 5cm(3cm)     Left: 2nd: 1cm(1cm) 3rd:  2cm(0cm) 4th: 4cm(0cm) 5th: 5cm(3cm)    06/27/24:  Digit flexion to the [facility]:    Right: 2nd: 4.5cm(2cm) 3rd: 2.5cm(0cm)  4th: 2cm (0cm) 5th: 5cm(2cm)     Left: 2nd: 1cm(1cm) 3rd: 1.20m(1cm) 4th: 1.5cm(1cm) 5th: 5cm(1.5cm)   08/07/24:  Digit flexion to the [facility]:    Right: 2nd: 3.5cm(1.5cm) 3rd: 0cm(0cm)  4th: 1.5cm (0cm) 5th: 5cm(1.5cm)     Left: 2nd: 0cm(0cm) 3rd: o)m(0cm) 4th: 0cm(0cm) 5th: 4.5cm(1.5cm)  UPPER EXTREMITY MMT:     MMT Right eval Right  06/27/24 Right 08/07/24 Left eval Left 06/27/24 Left 08/07/24  Shoulder flexion  3+/5 4-/5  3-/5 3-/5  Shoulder abduction 3-/5 3+/5 3+/5 3-/5 3+/5 3+/5  Shoulder adduction        Shoulder extension        Shoulder internal rotation        Shoulder external rotation        Middle trapezius        Lower trapezius        Elbow flexion 4/5 4/5 4/5 4/5 4/5 4/5  Elbow extension 4/5 4/5 4/5 2+/5 3-/5 3-/5  Wrist flexion 3-/5 3-/5 3-/5 4-/5 4-/5 4-/5  Wrist extension 3-/5 3/5 3+/5 4-/5 4-/5 4-/5  Wrist ulnar deviation        Wrist radial deviation        Wrist pronation        Wrist supination        (Blank rows = not tested)  HAND FUNCTION: 05/30/24: Grip strength: Right: 8 lbs; Left: 12 lbs, Lateral pinch: Right: 4 lbs, Left: 3 lbs, and 3 point pinch: Right: 3 lbs, Left: 3 lbs  06/27/24: Grip strength: Right: 11 lbs; Left: 12 lbs, Lateral pinch: Right: 4 lbs, Left: 3 lbs, and 3 point pinch: Right: 3 lbs, Left: 3 lbs  08/07/24: Grip strength: Right: 15 lbs; Left: 18 lbs, Lateral pinch: Right: 10 lbs, Left: 9 lbs, and 3 point pinch: Right: 5 lbs, Left: 3 lbs   COORDINATION:  COORDINATION: Eval:   9 Hole Peg test: Right: 48 sec; Left: 49 sec  06/27/24:  9 Hole Peg test: Right: 33 sec; Left: 49 sec  08/07/24:   9 Hole Peg test: Right: 31 sec; Left: 44 sec.  SENSATION: TBD   EDEMA: N/A    COGNITION: Overall cognitive status: Within functional limits for tasks assessed  VISION: Subjective report: No change  from baseline  PERCEPTION: [facility]  TREATMENT DATE: 08/07/24  Measurements were obtained, and goals were reviewed with the Pt.    PATIENT EDUCATION: Education details: digit ROM, UE strengthening, goals Person educated: Patient Education method: Explanation, Demonstration, Tactile cues, and Verbal cues Education comprehension: verbalized understanding, returned demonstration, verbal cues required, tactile cues required, and needs further education  HOME EXERCISE PROGRAM:    -Bilateral elbow flexion, and extension with yellow theraband.  -Yellow theraputty strengthening  following a HEP through [employer]. -contrasting heat 3 min., followed by 1 min. Cold  for 1 min. Ending with heat for 3 min. To the right hand. (Pt. Prefers just heat at home), moist heat to the left hand. -Massage to the right hand  -Right hand digit extension with the hand flat at the tabletop surface -Blue resistive foam block for Bilateral gross gripping, lateral pinch, and 3pt. Pinch strength -UE shoulder stabilization in supine with the shoulder flexed to 90 degrees with the elbow extended-formulating the alphabet    GOALS: Goals reviewed with patient? Yes  SHORT TERM GOALS: Target date: 07/07/2024     Pt. Will be independent with HEPs for UE strength, and coordination Baseline: 06/27/24: Independent; continue Eval: No current HEP Goal status: Ongoing   LONG TERM GOALS: Target date: 08/18/2024   To reduce pain by 3 grades on the pain scale in preparation for functional hand use during ADLs/IADLs Baseline: 06/27/24: 0/10 pain in the right hand. Pt. reports discomfort intermittently with left 5th digit flexion. Eval:  5/10 pain in the right hand Goal status: Progressed; Achieved  2.  Pt. Will formulate a full composite fist in preparation for securely holding kitchen/cooking  utensils. Baseline:08/07/24: Digit flexion to the [facility]: Right: 2nd: 3.5cm(1.5cm) 3rd: 0cm(0cm)  4th: 1.5cm (0cm) 5th: 5cm(1.5cm) Left: 2nd: 0cm(0cm) 3rd: o)m(0cm) 4th: 0cm(0cm) 5th: 4.5cm(1.5cm) 06/27/24: Digit flexion to the [facility]: Right: 2nd: 4.5cm(2cm) 3rd: 2.5cm(0cm)  4th: 2cm (0cm) 5th: 5cm(2cm) Left: 2nd: 1cm(1cm) 3rd: 1.74m(1cm) 4th: 1.5cm(1cm) 5th: 5cm(1.5cm) Pt. Had difficulty with holding and using utensils during self-feeding, Pt. Is improving with larger cooking utensilsEval: Digit flexion to the [facility]:M[facility]u: 2nd: 5cm(2cm) 3rd: 0cm  4th: 0cm 5th: 5cm(3cm) Left: 2nd: 1cm(1cm) 3rd: 2cm(0cm) 4th: 4cm(0cm) 5th: 5cm(3cm) Pt, has difficulty securely holding cooking utensils in her right hand Goal status: Progressing, Ongoing  3.  Pt. Will efficiently perform hand to mouth patterns during self-feeding with modified independence using her right hand. Baseline: 08/07/24: Pt. continues to have difficulty bringing her spoon all the way to her mouth, continues to need to bring her mouth down to her spoon.06/27/24: Pt. continues to have difficulty bringing her spoon all the way to her mouth, often needing to bring her mouth down to her spoon. Eval: Pt. Has difficulty completing hand to mouth patterns, often bringing her mouth down to meet the spoon 2/2 weakness in the RUE/hand Goal status: Progressing, Ongoing  4.  Pt. Will cut meat on her plate efficiently with modified independence Baseline: 08/07/24: Pt. reports that she is now able to cut meat. 06/27/24: Pt. Is improving with cutting meat, over continues to have difficulty cutting meat. Eval: Pt. Has difficulty cutting meat Goal status: Achieved  5.  Pt. Will increase bilateral shoulder abduction by 10 degrees be able to efficiently perform hair care Baseline: 08/07/24:  Shoulder abduction: Right: 118(120), Left: 107(112) Pt. is now able to brush reach up further to the back of her head.06/27/24: Shoulder abduction: Right: 109(120), Left: 107(112) Pt. is  able to reach up further to her head, however has difficulty sustaining BUE  in elevation while performing hair care. Eval: shoulder abduction: Right: 82(120), Left: 72(102) Goal status: Progressing  6.  Pt. Will write one sentence efficiently  with 75% in preparation for being able to fill in crossword puzzles. Baseline: 08/07/24: Continue 06/27/24: Name only with 90% legibility; one sentence completed with 50% legibility Eval: writing legibility: 75% for name only. Goal status: INITIAL  7. Pt. Will improve left elbow extension to be able to efficiently reach out for items on the table.    Baseline: Elbow extension: -26(-13)    Goal status: New    8. Pt. Will improve BUE strength to assist with ADLs, and IADL tasks  Baseline: 08/07/24: UE strength: Right Shoulder flexion: 4-/5, abduction: 3+/5, elbow flexion: 4/5, extension: 4/5, wrist flexion: 3-/5, extension: 3+/5; Left Shoulder flexion: 3-/5, abduction: 3+/5, elbow flexion: 4/5, extension: 3-/5, wrist flexion: 4-/5, extension: 4-/5  Goal status: New  9. Pt. will improve bilateral grip strength by 5# to be able to hold items more securely for ADLs/IADLs  Baseline: 08/07/24: Grip strength: Right: 15# Left: 18#  Goal status: New  10.  Pt. Will improve bilateral [facility] skills in order to be able to manipulate small objects  Baseline: 08/07/24: 9 Hole Peg test: Right: 31 sec; Left: 44 sec.   Goal status: New   ASSESSMENT:  CLINICAL IMPRESSION:  Measurements were obtained, and goals were reviewed with the Pt. Pt. has made progress over this progress reporting period. Pt. has progressed with bilateral UE ROM, bilateral gross digit flexion to the [facility], bilateral  grip strength, pinch strength, and [facility] skills. Pt. has met her goal for cutting food. Pt. continues to present with decreased BUE strength, limited ROM in Left shoulder flexion, abduction, and elbow extension, and 5th digit flexion to the [facility]. Pt. continues to present with difficulty  reaching up to perform hair care efficiently, and performing self-feeding tasks. Pt. continues to benefit from OT services to work on improving BUE functioning in order to increase engagement of the UEs during ADLs, and IADL tasks.    PERFORMANCE DEFICITS: in functional skills including ADLs, IADLs, coordination, dexterity, ROM, strength, pain, Fine motor control, Gross motor control, and UE functional use, , and psychosocial skills including coping strategies, environmental adaptation, habits, interpersonal interactions, and routines and behaviors.   IMPAIRMENTS: are limiting patient from ADLs, IADLs, and leisure.   CO-MORBIDITIES: may have co-morbidities  that affects occupational performance. Patient will benefit from skilled OT to address above impairments and improve overall function.  MODIFICATION OR ASSISTANCE TO COMPLETE EVALUATION: Min-Moderate modification of tasks or assist with assess necessary to complete an evaluation.  OT OCCUPATIONAL PROFILE AND HISTORY: Detailed assessment: Review of records and additional review of physical, cognitive, psychosocial history related to current functional performance.  CLINICAL DECISION MAKING: Moderate - several treatment options, min-mod task modification necessary  REHAB POTENTIAL: Good  EVALUATION COMPLEXITY: Moderate   PLAN:  OT FREQUENCY: 2x/week  OT DURATION: 12 weeks  PLANNED INTERVENTIONS: 97168 OT Re-evaluation, 97535 self care/ADL training, 02889 therapeutic exercise, 97530 therapeutic activity, 97112 neuromuscular re-education, 97140 manual therapy, 97018 paraffin, 02989 moist heat, 97010 cryotherapy, 97034 contrast bath, 97760 Orthotic Initial, 97763 Orthotic/Prosthetic subsequent, passive range of motion, balance training, functional mobility training, energy conservation, coping strategies training, patient/family education, and DME and/or AE instructions  RECOMMENDED OTHER SERVICES: PT for balance  CONSULTED AND AGREED WITH  PLAN OF CARE: Patient  PLAN FOR NEXT SESSION:  Assess sensation, and hand function skills at the next visit, as well administer the  MAM-20 outcome measure; treatment  Richardson Otter, MS, OTR/L  08/07/2024, 3:00 PM

## 2024-08-09 ENCOUNTER — Ambulatory Visit: Admitting: Occupational Therapy

## 2024-08-09 ENCOUNTER — Ambulatory Visit

## 2024-08-09 VITALS — BP 149/88 | HR 58

## 2024-08-09 DIAGNOSIS — R278 Other lack of coordination: Secondary | ICD-10-CM

## 2024-08-09 DIAGNOSIS — M6281 Muscle weakness (generalized): Secondary | ICD-10-CM

## 2024-08-09 DIAGNOSIS — R2681 Unsteadiness on feet: Secondary | ICD-10-CM

## 2024-08-09 DIAGNOSIS — R262 Difficulty in walking, not elsewhere classified: Secondary | ICD-10-CM

## 2024-08-09 NOTE — Therapy (Signed)
 OUTPATIENT PHYSICAL THERAPY NEURO TREATMENT   Patient Name: Maria Lowe MRN: [id number] DOB:[birth year], 89 y.o., female Today's Date: 08/09/2024   PCP: Garnette KIDD. Katrinka, MD REFERRING PROVIDER: Garnette KIDD. Katrinka, MD  END OF SESSION:  PT End of Session - 08/09/24 0934     Visit Number 3    Number of Visits 24    Date for Recertification  10/25/24    PT Start Time 0934    PT Stop Time 1015    PT Time Calculation (min) 41 min    Equipment Utilized During Treatment Gait belt    Activity Tolerance Patient tolerated treatment well    Behavior During Therapy WFL for tasks assessed/performed          Past Medical History:  Diagnosis Date   Anticoagulant long-term use    eliquis -- managed by cardiology   Arthritis    knees   CKD (chronic kidney disease), stage III (HCC)    CLL (chronic lymphocytic leukemia) (HCC) 11/27/2013   oncologist-- dr lonn;  initial dx 03/ 2015, no treatment, active survillance   Essential hypertension    followed by pcp   Full dentures    History of gastric ulcer    stomach ulcer when young   Lymphocytosis 2009   followed oncology   Permanent atrial fibrillation (HCC) 11/2020   cardiologist-- primary dr w. barbaraann and followed by atrial clinic;    dx 003/ 2022, atenolol  for rate control and on eliquis    Rectocele    Thrombocythemia 2009   related to CLL, followed by dr lonn   Urinary incontinence    Vaginal atrophy    Past Surgical History:  Procedure Laterality Date   APPENDECTOMY     1980s   CATARACT EXTRACTION W/ INTRAOCULAR LENS IMPLANT Bilateral 2020   RECTOCELE REPAIR N/A 08/25/2021   Procedure: POSTERIOR REPAIR (RECTOCELE);  Surgeon: Jertson, Jill Evelyn, MD;  Location: [facility];  Service: Gynecology;  Laterality: N/A;   TONSILLECTOMY AND ADENOIDECTOMY     age 87   Patient Active Problem List   Diagnosis Date Noted   Severe mitral regurgitation 05/23/2024   Deficiency anemia 04/21/2024   Dehydration  04/21/2024   Acute left-sided low back pain with left-sided sciatica 06/25/2022   H/O rectocele repair 08/25/2021   Secondary hypercoagulable state 12/10/2020   Persistent atrial fibrillation (HCC) 11/29/2020   Hyperlipidemia, unspecified 04/07/2018   CKD (chronic kidney disease), stage III (HCC) 02/09/2018   Rash 02/09/2018   Seborrheic dermatitis 01/08/2017   Former smoker 09/30/2015   Rectocele 03/29/2015   Thrombocytopenia 11/26/2014   CLL (chronic lymphocytic leukemia) (HCC) 11/27/2013   Hyperglycemia 05/08/2013   Essential hypertension, benign 11/07/2012   Obesity, unspecified 11/07/2012    ONSET DATE: May 2025  REFERRING DIAG: Severe muscle deconditioning  THERAPY DIAG:  Muscle weakness (generalized)  Unsteadiness on feet  Difficulty in walking, not elsewhere classified  Other lack of coordination  Rationale for Evaluation and Treatment: Rehabilitation  SUBJECTIVE:  SUBJECTIVE STATEMENT:  Pt reports no new complaints upon arrival.  Pt otherwise is doing well.   Pt accompanied by: family member (daughter)  PERTINENT HISTORY:  Pt was referred to outpatient PT for muscle deconditioning. Pt experienced a cat bite that required hospitalization while she was in [place]. Daughter states she was quite ill and was on bed rest for 3 weeks, which she states contributed to her decline in function. Prior to hospitalization patient was ambulating with a cane and independent with all activities. Currently she ambulates with a rollator, but would like to work toward ambulating with a cane. Pt enjoys reading and cooking. Her goals are to ambulated with LRAD, navigate stairs with confidence, and increase tolerance for cooking.   PAIN:  Are you having pain? Occasional arthritic pain, primarily right  knee  PRECAUTIONS: None  RED FLAGS: None   WEIGHT BEARING RESTRICTIONS: No  FALLS: Has patient fallen in last 6 months? Yes. Number of falls 3   LIVING ENVIRONMENT: Lives with: lives with their daughter Lives in: House/apartment Stairs: Yes; two steps at daughters house/ currently ramped Has following equipment at home: Counselling psychologist, Environmental Consultant - 4 wheeled, shower chair, Grab bars, and hospital bed  PLOF: Independent with basic ADLs   PATIENT GOALS: Pt would like to work on getting in and out of chairs, increasing tolerance for cooking, and using LRAD.    OBJECTIVE:  Note: Objective measures were completed at Evaluation unless otherwise noted.   COGNITION: Overall cognitive status: Within functional limits for tasks assessed    LOWER EXTREMITY ROM:     Active  Right Eval Left Eval  Hip flexion    Hip extension    Hip abduction    Hip adduction    Hip internal rotation    Hip external rotation    Knee flexion    Knee extension    Ankle dorsiflexion    Ankle plantarflexion    Ankle inversion    Ankle eversion     (Blank rows = not tested)  LOWER EXTREMITY MMT:    MMT Right Eval Left Eval  Hip flexion 4+ 4+  Hip extension    Hip abduction 3+ 3+  Hip adduction 3+ 3+  Hip internal rotation    Hip external rotation    Knee flexion 4+ 4+  Knee extension 4+ 4+  Ankle dorsiflexion 4 4  Ankle plantarflexion 3+ 3+  Ankle inversion    Ankle eversion    (Blank rows = not tested)  BED MOBILITY:  Pt reports no difficulty with bed mobility, but utilizes a hospital bed  RAMP:  Pt reports unsteadiness with ramps  STAIRS: To be assessed at next visit GAIT: Findings: Distance walked: 665ft, Assistive device utilized:Walker - 4 wheeled  FUNCTIONAL TESTS:  5 times sit to stand: 16.95 sec completed with UE support Timed up and go (TUG): 23.59 sec completed w/ 5TT 10 meter walk test: 16.34 sec; 0.61 m/s completed w/ 5TT 6 minute walk test: completed 5:07  min, 612 ft completed w/ 5TT Item Test date: 08/07/24  Sitting to standing 2. able to stand using hands after several tries  2. Standing unsupported 4. able to stand safely for 2 minutes  3. Sitting with back unsupported, feet supported 4. able to sit safely and securely for 2 minutes  4. Standing to sitting 4. sits safely with minimal use of hands  5. Pivot transfer  3. able to transfer safely with definite need of hands  6. Standing unsupported  with eyes closed 3. able to stand 10 seconds with supervision  7. Standing unsupported with feet together 1. needs help to attain position but able to stand 15 seconds feet together  8. Reaching forward with outstretched arms while standing 2. can reach forward 5 cm (2 inches)  9. Pick up object from the floor from standing 1. unable to pick up and needs supervision while trying  10. Turning to look behind over left and right shoulders while standing 1. needs supervision when turning  11. Turn 360 degrees 1. needs close supervision or verbal cuing  12. Place alternate foot on step or stool while standing unsupported 0. needs assistance to keep from falling/unable to try  13. Standing unsupported one foot in front 0. loses balance while stepping or standing  14. Standing on one leg 0. unable to try of needs assist to prevent fall    Total Score 26/56     PATIENT SURVEYS:  ABC scale: 37.5%                                                                                                                              TREATMENT DATE: 08/09/24   TherAct: To improve functional movements patterns for everyday tasks  STS x10  Pt noting that she was feeling a little dizzy with the STS, so vitals were assessed and noted below:  Vitals:              Sitting                    Standing                    Standing +3 Minutes   08/09/24 0941 08/09/24 0942 08/09/24 0948  BP: 135/68 (!) 154/102 (!) 149/88  Pulse: 62 66 (!) 58    Stair training at steps,  with use of R railing on the first attempt, then utilizing single railing on the L side going up to mimic potential house for thanksgiving.  Pt able to come down sideways with good technique utilizing B UE support.    Neuromuscular Re-Ed: To facilitate reeducation of movement, balance, posture, coordination, and/or proprioception/kinesthetic sense.  Standing with UE reaching with B UE's (L more limited than the R UE) and finding letters on the whiteboard and spelling her name on the board.  Pt standing on airex pad during this portion of the treatment session as well.  Pt was instructed to utilize the L UE to reach, however pt has limited ROM and functional use at this time, but still attempted to perform them.  Static stance on the incline board, 60 sec bouts x2 Static stance on the decline board, 60 sec bouts, x2         PATIENT EDUCATION: Education details: POC Person educated: Patient Education method: Explanation Education comprehension: verbalized understanding   HOME EXERCISE PROGRAM: Establish visit 2     GOALS: Goals  reviewed with patient? Yes  SHORT TERM GOALS: Target date: 09/13/2024  Patient will be independent with HEP to improve strength and mobility for better functional independence with ADL's Baseline: Establish at second visit Goal status: INITIAL   LONG TERM GOALS: Target date: 10/25/2024  Patient will improve 5xSTS to 12 seconds or less to demonstrate improved strength and balance. Baseline: 16.95 sec with UE support Goal status: INITIAL  2.  Patient will decrease TUG score by 8 seconds or more with LRAD to indicate decreased fall risk and improved transfer ability. Baseline:  23.59 sec completed with 4WW Goal status: INITIAL  3.  Patient will improve by >1.0 m/s with LRAD for improved community ambulation and indicate a decreased fall risk. Baseline: 0.61 m/s completed with 4WW Goal status: INITIAL  4.  Patient will improve to >800 ft  with LRAD for progression toward community ambulator.  Baseline: 5:07 min, 612 ft. Completed with 4WW Goal status: INITIAL  5.  Patient will improve BERG by 6 points or more to demonstrate decreased fall risk with functional activities.  Baseline: To be assessed at visit 2 Goal status: INITIAL  6.  Patient will be able to navigate 4 steps with step to pattern using UE support and supervision in order to enter daughter's home.  Baseline: to be assessed at visit 2 Goal status: INITIAL   ASSESSMENT:  CLINICAL IMPRESSION:  Pt responded well to the exercises and was able to improve her ability to stair climb with the use of the rails.  Pt is feeling much more confident with the stair climbing.  Pt did experience some difficulty with dizziness when performing STS, so vitals were assessed and found to have some orthostatic hypertension and was noted.  Will continue to monitor during subsequent sessions if pt continued to have dizzy spells.   Pt will continue to benefit from skilled therapy to address remaining deficits in order to improve overall QoL and return to PLOF.        OBJECTIVE IMPAIRMENTS: Abnormal gait, decreased activity tolerance, decreased balance, decreased endurance, decreased knowledge of use of DME, and decreased strength.   ACTIVITY LIMITATIONS: carrying, lifting, squatting, stairs, transfers, and reach over head  PARTICIPATION LIMITATIONS: meal prep, cleaning, laundry, and community activity  PERSONAL FACTORS: Age and 3+ comorbidities: CKD, CLL, HTN are also affecting patient's functional outcome.   REHAB POTENTIAL: Good  CLINICAL DECISION MAKING: Evolving/moderate complexity  EVALUATION COMPLEXITY: Moderate  PLAN:  PT FREQUENCY: 2x/week  PT DURATION: 12 weeks  PLANNED INTERVENTIONS: 97110-Therapeutic exercises, 97530- Therapeutic activity, 97112- Neuromuscular re-education, 97535- Self Care, [id number]- Manual therapy, [phone]- Gait training, Patient/Family  education, Balance training, Stair training, Vestibular training, and DME instructions  PLAN FOR NEXT SESSION:   Continue with balance assessment and strength testing Stair navigation training for Thanksgiving   Fonda Simpers, PT, DPT Physical Therapist - [facility]  08/09/24, 12:08 PM

## 2024-08-09 NOTE — Therapy (Signed)
 Occupational Therapy Treatment Note   Patient Name: Maria Lowe MRN: [id number] DOB:05/30/1931, 89 y.o., female Today's Date: 08/09/2024   REFERRING PROVIDER: Katrinka Senior, MD  END OF SESSION:   OT End of Session - 08/09/24 1016     Visit Number 21    Number of Visits 48    Date for Recertification  10/30/24    OT Start Time 1015    OT Stop Time 1100    OT Time Calculation (min) 45 min    Activity Tolerance Patient tolerated treatment well    Behavior During Therapy WFL for tasks assessed/performed           Past Medical History:  Diagnosis Date   Anticoagulant long-term use    eliquis -- managed by cardiology   Arthritis    knees   CKD (chronic kidney disease), stage III (HCC)    CLL (chronic lymphocytic leukemia) (HCC) 11/27/2013   oncologist-- dr lonn;  initial dx 03/ 2015, no treatment, active survillance   Essential hypertension    followed by pcp   Full dentures    History of gastric ulcer    stomach ulcer when young   Lymphocytosis 2009   followed oncology   Permanent atrial fibrillation (HCC) 11/2020   cardiologist-- primary dr w. barbaraann and followed by atrial clinic;    dx 003/ 2022, atenolol  for rate control and on eliquis    Rectocele    Thrombocythemia 2009   related to CLL, followed by dr lonn   Urinary incontinence    Vaginal atrophy    Past Surgical History:  Procedure Laterality Date   APPENDECTOMY     1980s   CATARACT EXTRACTION W/ INTRAOCULAR LENS IMPLANT Bilateral 2020   RECTOCELE REPAIR N/A 08/25/2021   Procedure: POSTERIOR REPAIR (RECTOCELE);  Surgeon: Jertson, Jill Evelyn, MD;  Location: [facility];  Service: Gynecology;  Laterality: N/A;   TONSILLECTOMY AND ADENOIDECTOMY     age 48   Patient Active Problem List   Diagnosis Date Noted   Severe mitral regurgitation 05/23/2024   Deficiency anemia 04/21/2024   Dehydration 04/21/2024   Acute left-sided low back pain with left-sided sciatica 06/25/2022    H/O rectocele repair 08/25/2021   Secondary hypercoagulable state 12/10/2020   Persistent atrial fibrillation (HCC) 11/29/2020   Hyperlipidemia, unspecified 04/07/2018   CKD (chronic kidney disease), stage III (HCC) 02/09/2018   Rash 02/09/2018   Seborrheic dermatitis 01/08/2017   Former smoker 09/30/2015   Rectocele 03/29/2015   Thrombocytopenia 11/26/2014   CLL (chronic lymphocytic leukemia) (HCC) 11/27/2013   Hyperglycemia 05/08/2013   Essential hypertension, benign 11/07/2012   Obesity, unspecified 11/07/2012    ONSET DATE: 01/2024  REFERRING DIAG: Right hand weakness  THERAPY DIAG:  Muscle weakness (generalized)  Other lack of coordination  Rationale for Evaluation and Treatment: Rehabilitation  SUBJECTIVE:   SUBJECTIVE STATEMENT:  Pt. Reports that her left 4th digit is hurting 1/10   Accompanied by self; with daughter in the reception area  PERTINENT HISTORY:  Pt. was referred for outpatient OT services for right hand pain. Pt. had received a cat bite approximately one week prior to her going on a trip to [place]. She was hospitalized in [place] for one month due to developing a Cat Bite Pasturella Infection. While in the hospital, Pt. reports that she was placed in an arm cast. Pt. reports that while she was in the hospital she was bedbound, was not allowed to get out of bed for the entire month, and became weak,  and deconditioned. Pt. Reports that she does not know why she was placed in a cast. [place] return to the [place] Pt. started home health OT and PT services, however OT services were limited due to staffing constraints. Pt. was then referred to outpatient OT services. PMHx includes: Severe mitral valve regurgitation, pericadrial, and pleural effusions, A-Fib, Chronic Lymphocytic Anemia, multifactorial anemia and CKD stage III, Diverticulosis, constipation, HTN, Hyperlipidemia, Prediabetes.  PRECAUTIONS: None  WEIGHT BEARING RESTRICTIONS: No  PAIN:  Are you having  pain? 1/10 left 4th digit pain  FALLS: Has patient fallen in last 6 months? 1 fall in the last 6 months  LIVING ENVIRONMENT: Lives with: Resides with her daughter Lives in: House- one level Stairs: ramped entrance Has following equipment at home: Counselling psychologist, Environmental Consultant - 4 wheeled, shower chair, bed side commode, and Grab bars, hospital bed, elevated toilet  PLOF: Independent, living alone, managing a household-per report  PATIENT GOALS:  To be able to cut her meat  OBJECTIVE:  Note: Objective measures were completed at Evaluation unless otherwise noted.  HAND DOMINANCE: Right  ADLs:  Transfers/ambulation related to ADLs: Supervision with a rollator walker Eating: Pt. has difficulty with using utensils with the right hand, often bringing her head down to meet hand/utensil when eating. Pt. Has  weakness with bringing the cup to her mouth. Difficulty with cutting food. Grooming: Difficulty sustaining the BUE's in up long enough to perform hair care. Independent with oral care,  UB Dressing: Independent, requires increased time to complete. Pt. Does endorse having difficulty reaching up to put a shirt on over her head. LB Dressing:Reports being able to perform LE dressing, Uses slide-on footwear.  Toileting:Pt. Reports independently being able to perform toileting care needs using her left hand. (Of note, Pt. Reports that she has always used her left hand to perform toilet hygiene care.) Bathing: Independent, daughter provides supervision from outside the shower curtain. Daughter assists with drying her back due to limited reach Tub Shower transfers: Daughter provides supervision tub/shower t/f's   IADLs: Light housekeeping: Pt. reports that she has resumed some light housekeeping tasks, however this has been limited. Pt. Reports that she does not vacuum, or sweep due to rollator use. Meal Prep: Pt. Has resumed weekly cooking on Sundays. Reports having difficulty cutting onions,  as well as difficulty holding and using a beater. Pt. Reports that she uses a modified technique to hold kitchen/cooking utensils. Community mobility: Pt. Relies on family  and friends for transportation Medication management: Pt. Has difficulty manipulating medication efficiently with the right hand Handwriting: 50% legible for name only Leisure/Hobbies: Loves to cook; enjoys reading , and working crossword puzzles.  MOBILITY STATUS:  Supervision using a rollator  FUNCTIONAL OUTCOME MEASURES: 05/30/24: MAM-20: 11/20 items were completed. The following items Pt. indicated as a 2 -very hard to do: wringing out a towel, opening a wide mouth bottle-previously opened, cutting meat on a plate, tying shoe laces, zipping a jacket, and squeezing toothpaste on a toothbrush. The following items Pt. indicated as a 1-cannot do: opening a medication bottle with a childproof cap, cutting nails with a clipper, buttoning clothing, picking up a 1/2 full water pitcher, and writing 3-4 lines legibly.  UPPER EXTREMITY ROM:     ROM Right eval Right 06/27/24 Right 08/07/24 Left eval Left 06/27/24 Left 08/07/24  Shoulder flexion 130(150) Scaption 134(150) 144(150)  100 scaption 100 scaption (120) flexion  Shoulder abduction 82(120) 109(120) 118(120) 72(102) 892(887) 107(118)  Shoulder adduction  Shoulder extension        Shoulder internal rotation        Shoulder external rotation        Elbow flexion [facility] WFL 148 WFL WFL  Elbow extension [facility] WFL  -60(-28) -35(-18) -26(-13)  Wrist flexion 26(60) 30(60) 36(60) WFL WFL 60(70)  Wrist extension 44(62) 46(62) 52(62) WFL WFL 70  Wrist ulnar deviation        Wrist radial deviation        Wrist pronation        Wrist supination        (Blank rows = not tested)  Eval: Digit flexion to the [facility]:    Right: 2nd: 5cm(2cm) 3rd: 0cm  4th: 0cm 5th: 5cm(3cm)     Left: 2nd: 1cm(1cm) 3rd: 2cm(0cm) 4th: 4cm(0cm) 5th: 5cm(3cm)    06/27/24:  Digit flexion to  the [facility]:    Right: 2nd: 4.5cm(2cm) 3rd: 2.5cm(0cm)  4th: 2cm (0cm) 5th: 5cm(2cm)     Left: 2nd: 1cm(1cm) 3rd: 1.69m(1cm) 4th: 1.5cm(1cm) 5th: 5cm(1.5cm)   08/07/24:  Digit flexion to the [facility]:    Right: 2nd: 3.5cm(1.5cm) 3rd: 0cm(0cm)  4th: 1.5cm (0cm) 5th: 5cm(1.5cm)     Left: 2nd: 0cm(0cm) 3rd: o)m(0cm) 4th: 0cm(0cm) 5th: 4.5cm(1.5cm)  UPPER EXTREMITY MMT:     MMT Right eval Right  06/27/24 Right 08/07/24 Left eval Left 06/27/24 Left 08/07/24  Shoulder flexion  3+/5 4-/5  3-/5 3-/5  Shoulder abduction 3-/5 3+/5 3+/5 3-/5 3+/5 3+/5  Shoulder adduction        Shoulder extension        Shoulder internal rotation        Shoulder external rotation        Middle trapezius        Lower trapezius        Elbow flexion 4/5 4/5 4/5 4/5 4/5 4/5  Elbow extension 4/5 4/5 4/5 2+/5 3-/5 3-/5  Wrist flexion 3-/5 3-/5 3-/5 4-/5 4-/5 4-/5  Wrist extension 3-/5 3/5 3+/5 4-/5 4-/5 4-/5  Wrist ulnar deviation        Wrist radial deviation        Wrist pronation        Wrist supination        (Blank rows = not tested)  HAND FUNCTION: 05/30/24: Grip strength: Right: 8 lbs; Left: 12 lbs, Lateral pinch: Right: 4 lbs, Left: 3 lbs, and 3 point pinch: Right: 3 lbs, Left: 3 lbs  06/27/24: Grip strength: Right: 11 lbs; Left: 12 lbs, Lateral pinch: Right: 4 lbs, Left: 3 lbs, and 3 point pinch: Right: 3 lbs, Left: 3 lbs  08/07/24: Grip strength: Right: 15 lbs; Left: 18 lbs, Lateral pinch: Right: 10 lbs, Left: 9 lbs, and 3 point pinch: Right: 5 lbs, Left: 3 lbs   COORDINATION:  COORDINATION: Eval:   9 Hole Peg test: Right: 48 sec; Left: 49 sec  06/27/24:  9 Hole Peg test: Right: 33 sec; Left: 49 sec  08/07/24:   9 Hole Peg test: Right: 31 sec; Left: 44 sec.  SENSATION: TBD   EDEMA: N/A    COGNITION: Overall cognitive status: Within functional limits for tasks assessed  VISION: Subjective report: No change from baseline  PERCEPTION: [facility]  TREATMENT DATE: 08/09/24  Therapeutic Ex.:    -Performed AROM, followed by PROM to the end range for bilateral shoulder flexion, abduction, external rotation, horizontal abduction. Emphasis was placed of slow prolonged gentle stretching 2/2 stiffness and soreness. -Pt. tolerated bilateral AROM/AAROM/PROM for right 2nd digit, and bilateral 5th digit MP, PIP, and DIP flexion to prepare for formulating a composite fist.   Therapeutic Activities:   Facilitated UE hand gross grip strengthening using a handheld vertical gripper with 17.9# of force to remove large pegs positioned vertically on a pegboard. The resistance was modified 2x's during the task to 11.2#, and 6.6#. -facilitated translatory movements of the hand moving 3 jumbo pegs from his palm to the tip of the 2nd digit and thumb in preparation for placing them vertically on the board in preparation for the grip task.   PATIENT EDUCATION: Education details: digit ROM, UE strengthening, goals Person educated: Patient Education method: Explanation, Demonstration, Tactile cues, and Verbal cues Education comprehension: verbalized understanding, returned demonstration, verbal cues required, tactile cues required, and needs further education  HOME EXERCISE PROGRAM:    -Bilateral elbow flexion, and extension with yellow theraband.  -Yellow theraputty strengthening  following a HEP through [employer]. -contrasting heat 3 min., followed by 1 min. Cold  for 1 min. Ending with heat for 3 min. To the right hand. (Pt. Prefers just heat at home), moist heat to the left hand. -Massage to the right hand  -Right hand digit extension with the hand flat at the tabletop surface -Blue resistive foam block for Bilateral gross gripping, lateral pinch, and 3pt. Pinch strength -UE shoulder stabilization in supine with the shoulder flexed to 90 degrees with the elbow extended-formulating the  alphabet    GOALS: Goals reviewed with patient? Yes  SHORT TERM GOALS: Target date: 07/07/2024     Pt. Will be independent with HEPs for UE strength, and coordination Baseline: 06/27/24: Independent; continue Eval: No current HEP Goal status: Ongoing   LONG TERM GOALS: Target date: 08/18/2024   To reduce pain by 3 grades on the pain scale in preparation for functional hand use during ADLs/IADLs Baseline: 06/27/24: 0/10 pain in the right hand. Pt. reports discomfort intermittently with left 5th digit flexion. Eval:  5/10 pain in the right hand Goal status: Progressed; Achieved  2.  Pt. Will formulate a full composite fist in preparation for securely holding kitchen/cooking utensils. Baseline:08/07/24: Digit flexion to the [facility]: Right: 2nd: 3.5cm(1.5cm) 3rd: 0cm(0cm)  4th: 1.5cm (0cm) 5th: 5cm(1.5cm) Left: 2nd: 0cm(0cm) 3rd: o)m(0cm) 4th: 0cm(0cm) 5th: 4.5cm(1.5cm) 06/27/24: Digit flexion to the [facility]: Right: 2nd: 4.5cm(2cm) 3rd: 2.5cm(0cm)  4th: 2cm (0cm) 5th: 5cm(2cm) Left: 2nd: 1cm(1cm) 3rd: 1.44m(1cm) 4th: 1.5cm(1cm) 5th: 5cm(1.5cm) Pt. Had difficulty with holding and using utensils during self-feeding, Pt. Is improving with larger cooking utensilsEval: Digit flexion to the [facility]:Mphyu: 2nd: 5cm(2cm) 3rd: 0cm  4th: 0cm 5th: 5cm(3cm) Left: 2nd: 1cm(1cm) 3rd: 2cm(0cm) 4th: 4cm(0cm) 5th: 5cm(3cm) Pt, has difficulty securely holding cooking utensils in her right hand Goal status: Progressing, Ongoing  3.  Pt. Will efficiently perform hand to mouth patterns during self-feeding with modified independence using her right hand. Baseline: 08/07/24: Pt. continues to have difficulty bringing her spoon all the way to her mouth, continues to need to bring her mouth down to her spoon.06/27/24: Pt. continues to have difficulty bringing her spoon all the way to her mouth, often needing to bring her mouth down to her spoon. Eval: Pt. Has difficulty completing hand to mouth patterns, often bringing her mouth down  to meet the spoon 2/2 weakness in the RUE/hand Goal status: Progressing, Ongoing  4.  Pt. Will cut meat on her plate efficiently with modified independence Baseline: 08/07/24: Pt. reports that she is now able to cut meat. 06/27/24: Pt. Is improving with cutting meat, over continues to have difficulty cutting meat. Eval: Pt. Has difficulty cutting meat Goal status: Achieved  5.  Pt. Will increase bilateral shoulder abduction by 10 degrees be able to efficiently perform hair care Baseline: 08/07/24:  Shoulder abduction: Right: 118(120), Left: 107(112) Pt. is now able to brush reach up further to the back of her head.06/27/24: Shoulder abduction: Right: 109(120), Left: 107(112) Pt. is able to reach up further to her head, however has difficulty sustaining BUE in elevation while performing hair care. Eval: shoulder abduction: Right: 82(120), Left: 72(102) Goal status: Progressing  6.  Pt. Will write one sentence efficiently  with 75% in preparation for being able to fill in crossword puzzles. Baseline: 08/07/24: Continue 06/27/24: Name only with 90% legibility; one sentence completed with 50% legibility Eval: writing legibility: 75% for name only. Goal status: INITIAL  7. Pt. Will improve left elbow extension to be able to efficiently reach out for items on the table.    Baseline: Elbow extension: -26(-13)    Goal status: New    8. Pt. Will improve BUE strength to assist with ADLs, and IADL tasks  Baseline: 08/07/24: UE strength: Right Shoulder flexion: 4-/5, abduction: 3+/5, elbow flexion: 4/5, extension: 4/5, wrist flexion: 3-/5, extension: 3+/5; Left Shoulder flexion: 3-/5, abduction: 3+/5, elbow flexion: 4/5, extension: 3-/5, wrist flexion: 4-/5, extension: 4-/5  Goal status: New  9. Pt. will improve bilateral grip strength by 5# to be able to hold items more securely for ADLs/IADLs  Baseline: 08/07/24: Grip strength: Right: 15# Left: 18#  Goal status: New  10.  Pt. Will improve bilateral  [facility] skills in order to be able to manipulate small objects  Baseline: 08/07/24: 9 Hole Peg test: Right: 31 sec; Left: 44 sec.   Goal status: New   ASSESSMENT:  CLINICAL IMPRESSION:  Pt. reports 1/10 pain in the left 4th digit. Pt. continues to present with weakness in the BUEs limiting number of active shoulder flexion, and abduction reps the Pt. is able to perform. Pt. required the resistance to be adjusted and graded down 2 times during the gross gripping task. Pt. started out at 17/9# for a few reps, then adjusted to 11.2 for a few reps, and finally to 6.6# for a few reps. Pt. Required cues for right hand placement on the gripper, as well as the gripper position vertically. Pt. continues to benefit from OT services to work on improving BUE functioning in order to increase engagement of the UEs during ADLs, and IADL tasks.    PERFORMANCE DEFICITS: in functional skills including ADLs, IADLs, coordination, dexterity, ROM, strength, pain, Fine motor control, Gross motor control, and UE functional use, , and psychosocial skills including coping strategies, environmental adaptation, habits, interpersonal interactions, and routines and behaviors.   IMPAIRMENTS: are limiting patient from ADLs, IADLs, and leisure.   CO-MORBIDITIES: may have co-morbidities  that affects occupational performance. Patient will benefit from skilled OT to address above impairments and improve overall function.  MODIFICATION OR ASSISTANCE TO COMPLETE EVALUATION: Min-Moderate modification of tasks or assist with assess necessary to complete an evaluation.  OT OCCUPATIONAL PROFILE AND HISTORY: Detailed assessment: Review of records and additional review of physical, cognitive, psychosocial history related to current functional performance.  CLINICAL DECISION  MAKING: Moderate - several treatment options, min-mod task modification necessary  REHAB POTENTIAL: Good  EVALUATION COMPLEXITY: Moderate   PLAN:  OT  FREQUENCY: 2x/week  OT DURATION: 12 weeks  PLANNED INTERVENTIONS: 97168 OT Re-evaluation, 97535 self care/ADL training, 02889 therapeutic exercise, 97530 therapeutic activity, 97112 neuromuscular re-education, 97140 manual therapy, 97018 paraffin, 02989 moist heat, 97010 cryotherapy, 97034 contrast bath, 97760 Orthotic Initial, 97763 Orthotic/Prosthetic subsequent, passive range of motion, balance training, functional mobility training, energy conservation, coping strategies training, patient/family education, and DME and/or AE instructions  RECOMMENDED OTHER SERVICES: PT for balance  CONSULTED AND AGREED WITH PLAN OF CARE: Patient  PLAN FOR NEXT SESSION:  Assess sensation, and hand function skills at the next visit, as well administer the MAM-20 outcome measure; treatment  Richardson Otter, MS, OTR/L  08/09/2024, 10:18 AM

## 2024-08-14 ENCOUNTER — Ambulatory Visit: Admitting: Occupational Therapy

## 2024-08-14 ENCOUNTER — Ambulatory Visit

## 2024-08-14 DIAGNOSIS — M6281 Muscle weakness (generalized): Secondary | ICD-10-CM

## 2024-08-14 DIAGNOSIS — R2681 Unsteadiness on feet: Secondary | ICD-10-CM

## 2024-08-14 DIAGNOSIS — R278 Other lack of coordination: Secondary | ICD-10-CM

## 2024-08-14 DIAGNOSIS — R262 Difficulty in walking, not elsewhere classified: Secondary | ICD-10-CM

## 2024-08-14 NOTE — Therapy (Signed)
 OUTPATIENT PHYSICAL THERAPY NEURO TREATMENT   Patient Name: Maria Lowe MRN: [id number] DOB:[birth year], 89 y.o., female Today's Date: 08/14/2024   PCP: Garnette KIDD. Katrinka, MD REFERRING PROVIDER: Garnette KIDD. Katrinka, MD  END OF SESSION:  PT End of Session - 08/14/24 1447     Visit Number 4    Number of Visits 24    Date for Recertification  10/25/24    PT Start Time 1446    PT Stop Time 1530    PT Time Calculation (min) 44 min    Equipment Utilized During Treatment Gait belt    Activity Tolerance Patient tolerated treatment well    Behavior During Therapy WFL for tasks assessed/performed          Past Medical History:  Diagnosis Date   Anticoagulant long-term use    eliquis -- managed by cardiology   Arthritis    knees   CKD (chronic kidney disease), stage III (HCC)    CLL (chronic lymphocytic leukemia) (HCC) 11/27/2013   oncologist-- dr lonn;  initial dx 03/ 2015, no treatment, active survillance   Essential hypertension    followed by pcp   Full dentures    History of gastric ulcer    stomach ulcer when young   Lymphocytosis 2009   followed oncology   Permanent atrial fibrillation (HCC) 11/2020   cardiologist-- primary dr w. barbaraann and followed by atrial clinic;    dx 003/ 2022, atenolol  for rate control and on eliquis    Rectocele    Thrombocythemia 2009   related to CLL, followed by dr lonn   Urinary incontinence    Vaginal atrophy    Past Surgical History:  Procedure Laterality Date   APPENDECTOMY     1980s   CATARACT EXTRACTION W/ INTRAOCULAR LENS IMPLANT Bilateral 2020   RECTOCELE REPAIR N/A 08/25/2021   Procedure: POSTERIOR REPAIR (RECTOCELE);  Surgeon: Jertson, Jill Evelyn, MD;  Location: [facility];  Service: Gynecology;  Laterality: N/A;   TONSILLECTOMY AND ADENOIDECTOMY     age 37   Patient Active Problem List   Diagnosis Date Noted   Severe mitral regurgitation 05/23/2024   Deficiency anemia 04/21/2024   Dehydration  04/21/2024   Acute left-sided low back pain with left-sided sciatica 06/25/2022   H/O rectocele repair 08/25/2021   Secondary hypercoagulable state 12/10/2020   Persistent atrial fibrillation (HCC) 11/29/2020   Hyperlipidemia, unspecified 04/07/2018   CKD (chronic kidney disease), stage III (HCC) 02/09/2018   Rash 02/09/2018   Seborrheic dermatitis 01/08/2017   Former smoker 09/30/2015   Rectocele 03/29/2015   Thrombocytopenia 11/26/2014   CLL (chronic lymphocytic leukemia) (HCC) 11/27/2013   Hyperglycemia 05/08/2013   Essential hypertension, benign 11/07/2012   Obesity, unspecified 11/07/2012    ONSET DATE: May 2025  REFERRING DIAG: Severe muscle deconditioning  THERAPY DIAG:  Muscle weakness (generalized)  Unsteadiness on feet  Difficulty in walking, not elsewhere classified  Other lack of coordination  Rationale for Evaluation and Treatment: Rehabilitation  SUBJECTIVE:  SUBJECTIVE STATEMENT: Pt reports doing well. Beginning to ambulate in her home with her [facility].   Pt accompanied by: family member (daughter)  PERTINENT HISTORY:  Pt was referred to outpatient PT for muscle deconditioning. Pt experienced a cat bite that required hospitalization while she was in [place]. Daughter states she was quite ill and was on bed rest for 3 weeks, which she states contributed to her decline in function. Prior to hospitalization patient was ambulating with a cane and independent with all activities. Currently she ambulates with a rollator, but would like to work toward ambulating with a cane. Pt enjoys reading and cooking. Her goals are to ambulated with LRAD, navigate stairs with confidence, and increase tolerance for cooking.   PAIN:  Are you having pain? Occasional arthritic pain, primarily right  knee  PRECAUTIONS: None  RED FLAGS: None   WEIGHT BEARING RESTRICTIONS: No  FALLS: Has patient fallen in last 6 months? Yes. Number of falls 3   LIVING ENVIRONMENT: Lives with: lives with their daughter Lives in: House/apartment Stairs: Yes; two steps at daughters house/ currently ramped Has following equipment at home: Counselling psychologist, Environmental Consultant - 4 wheeled, shower chair, Grab bars, and hospital bed  PLOF: Independent with basic ADLs   PATIENT GOALS: Pt would like to work on getting in and out of chairs, increasing tolerance for cooking, and using LRAD.    OBJECTIVE:  Note: Objective measures were completed at Evaluation unless otherwise noted.   COGNITION: Overall cognitive status: Within functional limits for tasks assessed    LOWER EXTREMITY ROM:     Active  Right Eval Left Eval  Hip flexion    Hip extension    Hip abduction    Hip adduction    Hip internal rotation    Hip external rotation    Knee flexion    Knee extension    Ankle dorsiflexion    Ankle plantarflexion    Ankle inversion    Ankle eversion     (Blank rows = not tested)  LOWER EXTREMITY MMT:    MMT Right Eval Left Eval  Hip flexion 4+ 4+  Hip extension    Hip abduction 3+ 3+  Hip adduction 3+ 3+  Hip internal rotation    Hip external rotation    Knee flexion 4+ 4+  Knee extension 4+ 4+  Ankle dorsiflexion 4 4  Ankle plantarflexion 3+ 3+  Ankle inversion    Ankle eversion    (Blank rows = not tested)  BED MOBILITY:  Pt reports no difficulty with bed mobility, but utilizes a hospital bed  RAMP:  Pt reports unsteadiness with ramps  STAIRS: To be assessed at next visit GAIT: Findings: Distance walked: 641ft, Assistive device utilized:Walker - 4 wheeled  FUNCTIONAL TESTS:  5 times sit to stand: 16.95 sec completed with UE support Timed up and go (TUG): 23.59 sec completed w/ 5TT 10 meter walk test: 16.34 sec; 0.61 m/s completed w/ 5TT 6 minute walk test: completed 5:07  min, 612 ft completed w/ 5TT Item Test date: 08/07/24  Sitting to standing 2. able to stand using hands after several tries  2. Standing unsupported 4. able to stand safely for 2 minutes  3. Sitting with back unsupported, feet supported 4. able to sit safely and securely for 2 minutes  4. Standing to sitting 4. sits safely with minimal use of hands  5. Pivot transfer  3. able to transfer safely with definite need of hands  6. Standing unsupported with  eyes closed 3. able to stand 10 seconds with supervision  7. Standing unsupported with feet together 1. needs help to attain position but able to stand 15 seconds feet together  8. Reaching forward with outstretched arms while standing 2. can reach forward 5 cm (2 inches)  9. Pick up object from the floor from standing 1. unable to pick up and needs supervision while trying  10. Turning to look behind over left and right shoulders while standing 1. needs supervision when turning  11. Turn 360 degrees 1. needs close supervision or verbal cuing  12. Place alternate foot on step or stool while standing unsupported 0. needs assistance to keep from falling/unable to try  13. Standing unsupported one foot in front 0. loses balance while stepping or standing  14. Standing on one leg 0. unable to try of needs assist to prevent fall    Total Score 26/56     PATIENT SURVEYS:  ABC scale: 37.5%                                                                                                                              TREATMENT DATE: 08/14/24   TherAct: To improve functional movements patterns for everyday tasks  Asc/desc steps: 4 steps, x3 reps CGA with single rail use step to pattern. Excellent safety and form/technique.   STS: 2x6, 1x3. Max TC's on torso for anterior trunk lean. Fair carryover. CGA.   Resisted gait with 4WW got community ambulation distance. 3x160', 2# on ankles. SBA.   Standing exercises with 2# AW's:   Marches: x12/LE, CGA    Abduction: x12/LE   Gait with SPC: 2x160' SPC in RUE. Excellent 2 point pattern. Seated rest b/t laps.    PATIENT EDUCATION: Education details: POC Person educated: Patient Education method: Explanation Education comprehension: verbalized understanding   HOME EXERCISE PROGRAM: Establish visit 2     GOALS: Goals reviewed with patient? Yes  SHORT TERM GOALS: Target date: 09/13/2024  Patient will be independent with HEP to improve strength and mobility for better functional independence with ADL's Baseline: Establish at second visit Goal status: INITIAL   LONG TERM GOALS: Target date: 10/25/2024  Patient will improve 5xSTS to 12 seconds or less to demonstrate improved strength and balance. Baseline: 16.95 sec with UE support Goal status: INITIAL  2.  Patient will decrease TUG score by 8 seconds or more with LRAD to indicate decreased fall risk and improved transfer ability. Baseline:  23.59 sec completed with 4WW Goal status: INITIAL  3.  Patient will improve by >1.0 m/s with LRAD for improved community ambulation and indicate a decreased fall risk. Baseline: 0.61 m/s completed with 4WW Goal status: INITIAL  4.  Patient will improve to >800 ft with LRAD for progression toward community ambulator.  Baseline: 5:07 min, 612 ft. Completed with 4WW Goal status: INITIAL  5.  Patient will improve BERG by 6 points or more to demonstrate decreased  fall risk with functional activities.  Baseline: To be assessed at visit 2 Goal status: INITIAL  6.  Patient will be able to navigate 4 steps with step to pattern using UE support and supervision in order to enter daughter's home.  Baseline: to be assessed at visit 2 Goal status: INITIAL   ASSESSMENT:  CLINICAL IMPRESSION: Continuing PT POC addressing LE strength and gait. Pt displays limited forward trunk lean with STS lending to difficult STS transfer. Continuing to work on museum/gallery curator and transitioning to gait  with SPC with excellent balance and tolerance with stairs and SPC use. Educated pt and pt's daughter to continue with [facility] training in household with supervision so PT can progress to community tasks with [facility] in clinic. Pt will continue to benefit from skilled therapy to address remaining deficits in order to improve overall QoL and return to PLOF.       OBJECTIVE IMPAIRMENTS: Abnormal gait, decreased activity tolerance, decreased balance, decreased endurance, decreased knowledge of use of DME, and decreased strength.   ACTIVITY LIMITATIONS: carrying, lifting, squatting, stairs, transfers, and reach over head  PARTICIPATION LIMITATIONS: meal prep, cleaning, laundry, and community activity  PERSONAL FACTORS: Age and 3+ comorbidities: CKD, CLL, HTN are also affecting patient's functional outcome.   REHAB POTENTIAL: Good  CLINICAL DECISION MAKING: Evolving/moderate complexity  EVALUATION COMPLEXITY: Moderate  PLAN:  PT FREQUENCY: 2x/week  PT DURATION: 12 weeks  PLANNED INTERVENTIONS: 97110-Therapeutic exercises, 97530- Therapeutic activity, 97112- Neuromuscular re-education, 97535- Self Care, [id number]- Manual therapy, [phone]- Gait training, Patient/Family education, Balance training, Stair training, Vestibular training, and DME instructions  PLAN FOR NEXT SESSION:  [facility] training for community tasks   Chili. Fairly IV, PT, DPT Physical Therapist-   [facility] 08/14/24, 3:36 PM

## 2024-08-14 NOTE — Therapy (Signed)
 Occupational Therapy Treatment Note   Patient Name: Maria Lowe MRN: [id number] DOB:[birth year], 89 y.o., female Today's Date: 08/14/2024   REFERRING PROVIDER: Katrinka Senior, MD  END OF SESSION:   OT End of Session - 08/14/24 1634     Visit Number 22    Number of Visits 48    Date for Recertification  10/30/24    OT Start Time 1400    OT Stop Time 1445    OT Time Calculation (min) 45 min    Activity Tolerance Patient tolerated treatment well    Behavior During Therapy WFL for tasks assessed/performed           Past Medical History:  Diagnosis Date   Anticoagulant long-term use    eliquis -- managed by cardiology   Arthritis    knees   CKD (chronic kidney disease), stage III (HCC)    CLL (chronic lymphocytic leukemia) (HCC) 11/27/2013   oncologist-- dr lonn;  initial dx 03/ 2015, no treatment, active survillance   Essential hypertension    followed by pcp   Full dentures    History of gastric ulcer    stomach ulcer when young   Lymphocytosis 2009   followed oncology   Permanent atrial fibrillation (HCC) 11/2020   cardiologist-- primary dr w. barbaraann and followed by atrial clinic;    dx 003/ 2022, atenolol  for rate control and on eliquis    Rectocele    Thrombocythemia 2009   related to CLL, followed by dr lonn   Urinary incontinence    Vaginal atrophy    Past Surgical History:  Procedure Laterality Date   APPENDECTOMY     1980s   CATARACT EXTRACTION W/ INTRAOCULAR LENS IMPLANT Bilateral 2020   RECTOCELE REPAIR N/A 08/25/2021   Procedure: POSTERIOR REPAIR (RECTOCELE);  Surgeon: Jertson, Jill Evelyn, MD;  Location: [facility];  Service: Gynecology;  Laterality: N/A;   TONSILLECTOMY AND ADENOIDECTOMY     age 53   Patient Active Problem List   Diagnosis Date Noted   Severe mitral regurgitation 05/23/2024   Deficiency anemia 04/21/2024   Dehydration 04/21/2024   Acute left-sided low back pain with left-sided sciatica 06/25/2022    H/O rectocele repair 08/25/2021   Secondary hypercoagulable state 12/10/2020   Persistent atrial fibrillation (HCC) 11/29/2020   Hyperlipidemia, unspecified 04/07/2018   CKD (chronic kidney disease), stage III (HCC) 02/09/2018   Rash 02/09/2018   Seborrheic dermatitis 01/08/2017   Former smoker 09/30/2015   Rectocele 03/29/2015   Thrombocytopenia 11/26/2014   CLL (chronic lymphocytic leukemia) (HCC) 11/27/2013   Hyperglycemia 05/08/2013   Essential hypertension, benign 11/07/2012   Obesity, unspecified 11/07/2012    ONSET DATE: 01/2024  REFERRING DIAG: Right hand weakness  THERAPY DIAG:  Muscle weakness (generalized)  Rationale for Evaluation and Treatment: Rehabilitation  SUBJECTIVE:   SUBJECTIVE STATEMENT:  Pt. Reports no pain today  Accompanied by self; with daughter in the reception area  PERTINENT HISTORY:  Pt. was referred for outpatient OT services for right hand pain. Pt. had received a cat bite approximately one week prior to her going on a trip to [place]. She was hospitalized in [place] for one month due to developing a Cat Bite Pasturella Infection. While in the hospital, Pt. reports that she was placed in an arm cast. Pt. reports that while she was in the hospital she was bedbound, was not allowed to get out of bed for the entire month, and became weak, and deconditioned. Pt. Reports that she does not know why she  was placed in a cast. [place] return to the [place] Pt. started home health OT and PT services, however OT services were limited due to staffing constraints. Pt. was then referred to outpatient OT services. PMHx includes: Severe mitral valve regurgitation, pericadrial, and pleural effusions, A-Fib, Chronic Lymphocytic Anemia, multifactorial anemia and CKD stage III, Diverticulosis, constipation, HTN, Hyperlipidemia, Prediabetes.  PRECAUTIONS: None  WEIGHT BEARING RESTRICTIONS: No  PAIN:  Are you having pain?  No pain  FALLS: Has patient fallen in last 6  months? 1 fall in the last 6 months  LIVING ENVIRONMENT: Lives with: Resides with her daughter Lives in: House- one level Stairs: ramped entrance Has following equipment at home: Counselling psychologist, Environmental Consultant - 4 wheeled, shower chair, bed side commode, and Grab bars, hospital bed, elevated toilet  PLOF: Independent, living alone, managing a household-per report  PATIENT GOALS:  To be able to cut her meat  OBJECTIVE:  Note: Objective measures were completed at Evaluation unless otherwise noted.  HAND DOMINANCE: Right  ADLs:  Transfers/ambulation related to ADLs: Supervision with a rollator walker Eating: Pt. has difficulty with using utensils with the right hand, often bringing her head down to meet hand/utensil when eating. Pt. Has  weakness with bringing the cup to her mouth. Difficulty with cutting food. Grooming: Difficulty sustaining the BUE's in up long enough to perform hair care. Independent with oral care,  UB Dressing: Independent, requires increased time to complete. Pt. Does endorse having difficulty reaching up to put a shirt on over her head. LB Dressing:Reports being able to perform LE dressing, Uses slide-on footwear.  Toileting:Pt. Reports independently being able to perform toileting care needs using her left hand. (Of note, Pt. Reports that she has always used her left hand to perform toilet hygiene care.) Bathing: Independent, daughter provides supervision from outside the shower curtain. Daughter assists with drying her back due to limited reach Tub Shower transfers: Daughter provides supervision tub/shower t/f's   IADLs: Light housekeeping: Pt. reports that she has resumed some light housekeeping tasks, however this has been limited. Pt. Reports that she does not vacuum, or sweep due to rollator use. Meal Prep: Pt. Has resumed weekly cooking on Sundays. Reports having difficulty cutting onions, as well as difficulty holding and using a beater. Pt. Reports that she  uses a modified technique to hold kitchen/cooking utensils. Community mobility: Pt. Relies on family  and friends for transportation Medication management: Pt. Has difficulty manipulating medication efficiently with the right hand Handwriting: 50% legible for name only Leisure/Hobbies: Loves to cook; enjoys reading , and working crossword puzzles.  MOBILITY STATUS:  Supervision using a rollator  FUNCTIONAL OUTCOME MEASURES: 05/30/24: MAM-20: 11/20 items were completed. The following items Pt. indicated as a 2 -very hard to do: wringing out a towel, opening a wide mouth bottle-previously opened, cutting meat on a plate, tying shoe laces, zipping a jacket, and squeezing toothpaste on a toothbrush. The following items Pt. indicated as a 1-cannot do: opening a medication bottle with a childproof cap, cutting nails with a clipper, buttoning clothing, picking up a 1/2 full water pitcher, and writing 3-4 lines legibly.  UPPER EXTREMITY ROM:     ROM Right eval Right 06/27/24 Right 08/07/24 Left eval Left 06/27/24 Left 08/07/24  Shoulder flexion 130(150) Scaption 134(150) 144(150)  100 scaption 100 scaption (120) flexion  Shoulder abduction 82(120) 109(120) 118(120) 72(102) 892(887) 107(118)  Shoulder adduction        Shoulder extension        Shoulder  internal rotation        Shoulder external rotation        Elbow flexion [facility] WFL 148 WFL WFL  Elbow extension [facility] WFL  -60(-28) -35(-18) -26(-13)  Wrist flexion 26(60) 30(60) 36(60) WFL WFL 60(70)  Wrist extension 44(62) 46(62) 52(62) WFL WFL 70  Wrist ulnar deviation        Wrist radial deviation        Wrist pronation        Wrist supination        (Blank rows = not tested)  Eval: Digit flexion to the [facility]:    Right: 2nd: 5cm(2cm) 3rd: 0cm  4th: 0cm 5th: 5cm(3cm)     Left: 2nd: 1cm(1cm) 3rd: 2cm(0cm) 4th: 4cm(0cm) 5th: 5cm(3cm)    06/27/24:  Digit flexion to the [facility]:    Right: 2nd: 4.5cm(2cm) 3rd: 2.5cm(0cm)  4th: 2cm (0cm) 5th:  5cm(2cm)     Left: 2nd: 1cm(1cm) 3rd: 1.72m(1cm) 4th: 1.5cm(1cm) 5th: 5cm(1.5cm)   08/07/24:  Digit flexion to the [facility]:    Right: 2nd: 3.5cm(1.5cm) 3rd: 0cm(0cm)  4th: 1.5cm (0cm) 5th: 5cm(1.5cm)     Left: 2nd: 0cm(0cm) 3rd: o)m(0cm) 4th: 0cm(0cm) 5th: 4.5cm(1.5cm)  UPPER EXTREMITY MMT:     MMT Right eval Right  06/27/24 Right 08/07/24 Left eval Left 06/27/24 Left 08/07/24  Shoulder flexion  3+/5 4-/5  3-/5 3-/5  Shoulder abduction 3-/5 3+/5 3+/5 3-/5 3+/5 3+/5  Shoulder adduction        Shoulder extension        Shoulder internal rotation        Shoulder external rotation        Middle trapezius        Lower trapezius        Elbow flexion 4/5 4/5 4/5 4/5 4/5 4/5  Elbow extension 4/5 4/5 4/5 2+/5 3-/5 3-/5  Wrist flexion 3-/5 3-/5 3-/5 4-/5 4-/5 4-/5  Wrist extension 3-/5 3/5 3+/5 4-/5 4-/5 4-/5  Wrist ulnar deviation        Wrist radial deviation        Wrist pronation        Wrist supination        (Blank rows = not tested)  HAND FUNCTION: 05/30/24: Grip strength: Right: 8 lbs; Left: 12 lbs, Lateral pinch: Right: 4 lbs, Left: 3 lbs, and 3 point pinch: Right: 3 lbs, Left: 3 lbs  06/27/24: Grip strength: Right: 11 lbs; Left: 12 lbs, Lateral pinch: Right: 4 lbs, Left: 3 lbs, and 3 point pinch: Right: 3 lbs, Left: 3 lbs  08/07/24: Grip strength: Right: 15 lbs; Left: 18 lbs, Lateral pinch: Right: 10 lbs, Left: 9 lbs, and 3 point pinch: Right: 5 lbs, Left: 3 lbs   COORDINATION:  COORDINATION: Eval:   9 Hole Peg test: Right: 48 sec; Left: 49 sec  06/27/24:  9 Hole Peg test: Right: 33 sec; Left: 49 sec  08/07/24:   9 Hole Peg test: Right: 31 sec; Left: 44 sec.  SENSATION: TBD   EDEMA: N/A    COGNITION: Overall cognitive status: Within functional limits for tasks assessed  VISION: Subjective report: No change from baseline  PERCEPTION: [facility]  TREATMENT DATE: 08/14/24  Therapeutic Ex.:    -Performed AROM, followed by PROM to the end range for bilateral shoulder flexion, abduction, external rotation, horizontal abduction. Emphasis was placed on slow prolonged gentle stretching 2/2 stiffness and soreness. -Pt. tolerated bilateral AROM/AAROM/PROM for right 2nd digit, and bilateral 5th digit MP, PIP, and DIP flexion to prepare for formulating a composite fist. -Pt. Performed bilateral gross grip strengthening using 2 red resistive bands for 2 sets 10 reps each.  -Performed BUE strengthening using a 2# hand weight for elbow flexion, and extension, forearm supination/pronation, wrist flexion/extension, and radial deviation for 1-2 sets 510 reps each with cues, and assist for proper form, and technique.  Rest breaks were required during the task.     Therapeutic Activities:   -Facilitated bilateral [facility] skills grasping for 1 resistive cubes using a 3pt. grasp pattern while the board is placed at a vertical angle to encourage wrist extension. Emphasis was placed on islolating 2nd digit extension to press them into place. The task was graded and modified with the board positioned flat at the tabletop surface approximately 3/4 of the way through the task.  PATIENT EDUCATION: Education details: digit ROM, UE strengthening Person educated: Patient Education method: Explanation, Demonstration, Tactile cues, and Verbal cues Education comprehension: verbalized understanding, returned demonstration, verbal cues required, tactile cues required, and needs further education  HOME EXERCISE PROGRAM:    -Bilateral elbow flexion, and extension with yellow theraband.  -Yellow theraputty strengthening  following a HEP through [employer]. -contrasting heat 3 min., followed by 1 min. Cold  for 1 min. Ending with heat for 3 min. To the right hand. (Pt. Prefers just heat at home), moist heat to the left hand. -Massage to the right hand  -Right hand digit  extension with the hand flat at the tabletop surface -Blue resistive foam block for Bilateral gross gripping, lateral pinch, and 3pt. Pinch strength -UE shoulder stabilization in supine with the shoulder flexed to 90 degrees with the elbow extended-formulating the alphabet    GOALS: Goals reviewed with patient? Yes  SHORT TERM GOALS: Target date: 07/07/2024     Pt. Will be independent with HEPs for UE strength, and coordination Baseline: 06/27/24: Independent; continue Eval: No current HEP Goal status: Ongoing   LONG TERM GOALS: Target date: 08/18/2024   To reduce pain by 3 grades on the pain scale in preparation for functional hand use during ADLs/IADLs Baseline: 06/27/24: 0/10 pain in the right hand. Pt. reports discomfort intermittently with left 5th digit flexion. Eval:  5/10 pain in the right hand Goal status: Progressed; Achieved  2.  Pt. Will formulate a full composite fist in preparation for securely holding kitchen/cooking utensils. Baseline:08/07/24: Digit flexion to the [facility]: Right: 2nd: 3.5cm(1.5cm) 3rd: 0cm(0cm)  4th: 1.5cm (0cm) 5th: 5cm(1.5cm) Left: 2nd: 0cm(0cm) 3rd: o)m(0cm) 4th: 0cm(0cm) 5th: 4.5cm(1.5cm) 06/27/24: Digit flexion to the [facility]: Right: 2nd: 4.5cm(2cm) 3rd: 2.5cm(0cm)  4th: 2cm (0cm) 5th: 5cm(2cm) Left: 2nd: 1cm(1cm) 3rd: 1.19m(1cm) 4th: 1.5cm(1cm) 5th: 5cm(1.5cm) Pt. Had difficulty with holding and using utensils during self-feeding, Pt. Is improving with larger cooking utensilsEval: Digit flexion to the [facility]:Mphyu: 2nd: 5cm(2cm) 3rd: 0cm  4th: 0cm 5th: 5cm(3cm) Left: 2nd: 1cm(1cm) 3rd: 2cm(0cm) 4th: 4cm(0cm) 5th: 5cm(3cm) Pt, has difficulty securely holding cooking utensils in her right hand Goal status: Progressing, Ongoing  3.  Pt. Will efficiently perform hand to mouth patterns during self-feeding with modified independence using her right hand. Baseline: 08/07/24: Pt. continues to have difficulty bringing her spoon all the way to  her mouth, continues to need  to bring her mouth down to her spoon.06/27/24: Pt. continues to have difficulty bringing her spoon all the way to her mouth, often needing to bring her mouth down to her spoon. Eval: Pt. Has difficulty completing hand to mouth patterns, often bringing her mouth down to meet the spoon 2/2 weakness in the RUE/hand Goal status: Progressing, Ongoing  4.  Pt. Will cut meat on her plate efficiently with modified independence Baseline: 08/07/24: Pt. reports that she is now able to cut meat. 06/27/24: Pt. Is improving with cutting meat, over continues to have difficulty cutting meat. Eval: Pt. Has difficulty cutting meat Goal status: Achieved  5.  Pt. Will increase bilateral shoulder abduction by 10 degrees be able to efficiently perform hair care Baseline: 08/07/24:  Shoulder abduction: Right: 118(120), Left: 107(112) Pt. is now able to brush reach up further to the back of her head.06/27/24: Shoulder abduction: Right: 109(120), Left: 107(112) Pt. is able to reach up further to her head, however has difficulty sustaining BUE in elevation while performing hair care. Eval: shoulder abduction: Right: 82(120), Left: 72(102) Goal status: Progressing  6.  Pt. Will write one sentence efficiently  with 75% in preparation for being able to fill in crossword puzzles. Baseline: 08/07/24: Continue 06/27/24: Name only with 90% legibility; one sentence completed with 50% legibility Eval: writing legibility: 75% for name only. Goal status: INITIAL  7. Pt. Will improve left elbow extension to be able to efficiently reach out for items on the table.    Baseline: Elbow extension: -26(-13)    Goal status: New    8. Pt. Will improve BUE strength to assist with ADLs, and IADL tasks  Baseline: 08/07/24: UE strength: Right Shoulder flexion: 4-/5, abduction: 3+/5, elbow flexion: 4/5, extension: 4/5, wrist flexion: 3-/5, extension: 3+/5; Left Shoulder flexion: 3-/5, abduction: 3+/5, elbow flexion: 4/5, extension: 3-/5, wrist  flexion: 4-/5, extension: 4-/5  Goal status: New  9. Pt. will improve bilateral grip strength by 5# to be able to hold items more securely for ADLs/IADLs  Baseline: 08/07/24: Grip strength: Right: 15# Left: 18#  Goal status: New  10.  Pt. Will improve bilateral [facility] skills in order to be able to manipulate small objects  Baseline: 08/07/24: 9 Hole Peg test: Right: 31 sec; Left: 44 sec.   Goal status: New   ASSESSMENT:  CLINICAL IMPRESSION:  Pt. required cues, and assist for form and technique during the UE exercises when using the hand weight. Pt. was able to maintain a 3pt. grasp against resistance on the resistive board when removing the cubes, however required the task to be modified and the board placed flat at the tabletop surface 2/2 fatigue. Pt. continues to benefit from OT services to work on improving BUE functioning in order to increase engagement of the UEs during ADLs, and IADL tasks.    PERFORMANCE DEFICITS: in functional skills including ADLs, IADLs, coordination, dexterity, ROM, strength, pain, Fine motor control, Gross motor control, and UE functional use, , and psychosocial skills including coping strategies, environmental adaptation, habits, interpersonal interactions, and routines and behaviors.   IMPAIRMENTS: are limiting patient from ADLs, IADLs, and leisure.   CO-MORBIDITIES: may have co-morbidities  that affects occupational performance. Patient will benefit from skilled OT to address above impairments and improve overall function.  MODIFICATION OR ASSISTANCE TO COMPLETE EVALUATION: Min-Moderate modification of tasks or assist with assess necessary to complete an evaluation.  OT OCCUPATIONAL PROFILE AND HISTORY: Detailed assessment: Review of records and additional  review of physical, cognitive, psychosocial history related to current functional performance.  CLINICAL DECISION MAKING: Moderate - several treatment options, min-mod task modification  necessary  REHAB POTENTIAL: Good  EVALUATION COMPLEXITY: Moderate   PLAN:  OT FREQUENCY: 2x/week  OT DURATION: 12 weeks  PLANNED INTERVENTIONS: 97168 OT Re-evaluation, 97535 self care/ADL training, 02889 therapeutic exercise, 97530 therapeutic activity, 97112 neuromuscular re-education, 97140 manual therapy, 97018 paraffin, 02989 moist heat, 97010 cryotherapy, 97034 contrast bath, 97760 Orthotic Initial, 97763 Orthotic/Prosthetic subsequent, passive range of motion, balance training, functional mobility training, energy conservation, coping strategies training, patient/family education, and DME and/or AE instructions  RECOMMENDED OTHER SERVICES: PT for balance  CONSULTED AND AGREED WITH PLAN OF CARE: Patient  PLAN FOR NEXT SESSION:  Assess sensation, and hand function skills at the next visit, as well administer the MAM-20 outcome measure; treatment  Richardson Otter, MS, OTR/L  08/14/2024, 4:38 PM

## 2024-08-16 ENCOUNTER — Ambulatory Visit: Admitting: Occupational Therapy

## 2024-08-16 ENCOUNTER — Ambulatory Visit

## 2024-08-21 ENCOUNTER — Ambulatory Visit: Attending: Family Medicine | Admitting: Occupational Therapy

## 2024-08-21 DIAGNOSIS — M6281 Muscle weakness (generalized): Secondary | ICD-10-CM | POA: Insufficient documentation

## 2024-08-21 DIAGNOSIS — R262 Difficulty in walking, not elsewhere classified: Secondary | ICD-10-CM | POA: Insufficient documentation

## 2024-08-21 DIAGNOSIS — R278 Other lack of coordination: Secondary | ICD-10-CM | POA: Insufficient documentation

## 2024-08-21 DIAGNOSIS — R2681 Unsteadiness on feet: Secondary | ICD-10-CM | POA: Diagnosis present

## 2024-08-21 NOTE — Therapy (Signed)
 Occupational Therapy Treatment Note   Patient Name: Maria Lowe MRN: [id number] DOB:1931/07/12, 89 y.o., female Today's Date: 08/21/2024   REFERRING PROVIDER: Katrinka Senior, MD  END OF SESSION:   OT End of Session - 08/21/24 1002     Visit Number 23    Number of Visits 48    Date for Recertification  10/30/24    OT Start Time 0845    OT Stop Time 0930    OT Time Calculation (min) 45 min    Activity Tolerance Patient tolerated treatment well    Behavior During Therapy WFL for tasks assessed/performed           Past Medical History:  Diagnosis Date   Anticoagulant long-term use    eliquis -- managed by cardiology   Arthritis    knees   CKD (chronic kidney disease), stage III (HCC)    CLL (chronic lymphocytic leukemia) (HCC) 11/27/2013   oncologist-- dr lonn;  initial dx 03/ 2015, no treatment, active survillance   Essential hypertension    followed by pcp   Full dentures    History of gastric ulcer    stomach ulcer when young   Lymphocytosis 2009   followed oncology   Permanent atrial fibrillation (HCC) 11/2020   cardiologist-- primary dr w. barbaraann and followed by atrial clinic;    dx 003/ 2022, atenolol  for rate control and on eliquis    Rectocele    Thrombocythemia 2009   related to CLL, followed by dr lonn   Urinary incontinence    Vaginal atrophy    Past Surgical History:  Procedure Laterality Date   APPENDECTOMY     1980s   CATARACT EXTRACTION W/ INTRAOCULAR LENS IMPLANT Bilateral 2020   RECTOCELE REPAIR N/A 08/25/2021   Procedure: POSTERIOR REPAIR (RECTOCELE);  Surgeon: Jertson, Jill Evelyn, MD;  Location: [facility];  Service: Gynecology;  Laterality: N/A;   TONSILLECTOMY AND ADENOIDECTOMY     age 68   Patient Active Problem List   Diagnosis Date Noted   Severe mitral regurgitation 05/23/2024   Deficiency anemia 04/21/2024   Dehydration 04/21/2024   Acute left-sided low back pain with left-sided sciatica 06/25/2022    H/O rectocele repair 08/25/2021   Secondary hypercoagulable state 12/10/2020   Persistent atrial fibrillation (HCC) 11/29/2020   Hyperlipidemia, unspecified 04/07/2018   CKD (chronic kidney disease), stage III (HCC) 02/09/2018   Rash 02/09/2018   Seborrheic dermatitis 01/08/2017   Former smoker 09/30/2015   Rectocele 03/29/2015   Thrombocytopenia 11/26/2014   CLL (chronic lymphocytic leukemia) (HCC) 11/27/2013   Hyperglycemia 05/08/2013   Essential hypertension, benign 11/07/2012   Obesity, unspecified 11/07/2012    ONSET DATE: 01/2024  REFERRING DIAG: Right hand weakness  THERAPY DIAG:  Muscle weakness (generalized)  Rationale for Evaluation and Treatment: Rehabilitation  SUBJECTIVE:   SUBJECTIVE STATEMENT:  Pt. Reports no pain today  Accompanied by self; with daughter in the reception area  PERTINENT HISTORY:  Pt. was referred for outpatient OT services for right hand pain. Pt. had received a cat bite approximately one week prior to her going on a trip to [place]. She was hospitalized in [place] for one month due to developing a Cat Bite Pasturella Infection. While in the hospital, Pt. reports that she was placed in an arm cast. Pt. reports that while she was in the hospital she was bedbound, was not allowed to get out of bed for the entire month, and became weak, and deconditioned. Pt. Reports that she does not know why she  was placed in a cast. [place] return to the [place] Pt. started home health OT and PT services, however OT services were limited due to staffing constraints. Pt. was then referred to outpatient OT services. PMHx includes: Severe mitral valve regurgitation, pericadrial, and pleural effusions, A-Fib, Chronic Lymphocytic Anemia, multifactorial anemia and CKD stage III, Diverticulosis, constipation, HTN, Hyperlipidemia, Prediabetes.  PRECAUTIONS: None  WEIGHT BEARING RESTRICTIONS: No  PAIN:  Are you having pain?  No pain  FALLS: Has patient fallen in last 6  months? 1 fall in the last 6 months  LIVING ENVIRONMENT: Lives with: Resides with her daughter Lives in: House- one level Stairs: ramped entrance Has following equipment at home: Counselling psychologist, Environmental Consultant - 4 wheeled, shower chair, bed side commode, and Grab bars, hospital bed, elevated toilet  PLOF: Independent, living alone, managing a household-per report  PATIENT GOALS:  To be able to cut her meat  OBJECTIVE:  Note: Objective measures were completed at Evaluation unless otherwise noted.  HAND DOMINANCE: Right  ADLs:  Transfers/ambulation related to ADLs: Supervision with a rollator walker Eating: Pt. has difficulty with using utensils with the right hand, often bringing her head down to meet hand/utensil when eating. Pt. Has  weakness with bringing the cup to her mouth. Difficulty with cutting food. Grooming: Difficulty sustaining the BUE's in up long enough to perform hair care. Independent with oral care,  UB Dressing: Independent, requires increased time to complete. Pt. Does endorse having difficulty reaching up to put a shirt on over her head. LB Dressing:Reports being able to perform LE dressing, Uses slide-on footwear.  Toileting:Pt. Reports independently being able to perform toileting care needs using her left hand. (Of note, Pt. Reports that she has always used her left hand to perform toilet hygiene care.) Bathing: Independent, daughter provides supervision from outside the shower curtain. Daughter assists with drying her back due to limited reach Tub Shower transfers: Daughter provides supervision tub/shower t/f's   IADLs: Light housekeeping: Pt. reports that she has resumed some light housekeeping tasks, however this has been limited. Pt. Reports that she does not vacuum, or sweep due to rollator use. Meal Prep: Pt. Has resumed weekly cooking on Sundays. Reports having difficulty cutting onions, as well as difficulty holding and using a beater. Pt. Reports that she  uses a modified technique to hold kitchen/cooking utensils. Community mobility: Pt. Relies on family  and friends for transportation Medication management: Pt. Has difficulty manipulating medication efficiently with the right hand Handwriting: 50% legible for name only Leisure/Hobbies: Loves to cook; enjoys reading , and working crossword puzzles.  MOBILITY STATUS:  Supervision using a rollator  FUNCTIONAL OUTCOME MEASURES: 05/30/24: MAM-20: 11/20 items were completed. The following items Pt. indicated as a 2 -very hard to do: wringing out a towel, opening a wide mouth bottle-previously opened, cutting meat on a plate, tying shoe laces, zipping a jacket, and squeezing toothpaste on a toothbrush. The following items Pt. indicated as a 1-cannot do: opening a medication bottle with a childproof cap, cutting nails with a clipper, buttoning clothing, picking up a 1/2 full water pitcher, and writing 3-4 lines legibly.  UPPER EXTREMITY ROM:     ROM Right eval Right 06/27/24 Right 08/07/24 Left eval Left 06/27/24 Left 08/07/24  Shoulder flexion 130(150) Scaption 134(150) 144(150)  100 scaption 100 scaption (120) flexion  Shoulder abduction 82(120) 109(120) 118(120) 72(102) 892(887) 107(118)  Shoulder adduction        Shoulder extension        Shoulder  internal rotation        Shoulder external rotation        Elbow flexion [facility] WFL 148 WFL WFL  Elbow extension [facility] WFL  -60(-28) -35(-18) -26(-13)  Wrist flexion 26(60) 30(60) 36(60) WFL WFL 60(70)  Wrist extension 44(62) 46(62) 52(62) WFL WFL 70  Wrist ulnar deviation        Wrist radial deviation        Wrist pronation        Wrist supination        (Blank rows = not tested)  Eval: Digit flexion to the [facility]:    Right: 2nd: 5cm(2cm) 3rd: 0cm  4th: 0cm 5th: 5cm(3cm)     Left: 2nd: 1cm(1cm) 3rd: 2cm(0cm) 4th: 4cm(0cm) 5th: 5cm(3cm)    06/27/24:  Digit flexion to the [facility]:    Right: 2nd: 4.5cm(2cm) 3rd: 2.5cm(0cm)  4th: 2cm (0cm) 5th:  5cm(2cm)     Left: 2nd: 1cm(1cm) 3rd: 1.28m(1cm) 4th: 1.5cm(1cm) 5th: 5cm(1.5cm)   08/07/24:  Digit flexion to the [facility]:    Right: 2nd: 3.5cm(1.5cm) 3rd: 0cm(0cm)  4th: 1.5cm (0cm) 5th: 5cm(1.5cm)     Left: 2nd: 0cm(0cm) 3rd: o)m(0cm) 4th: 0cm(0cm) 5th: 4.5cm(1.5cm)  UPPER EXTREMITY MMT:     MMT Right eval Right  06/27/24 Right 08/07/24 Left eval Left 06/27/24 Left 08/07/24  Shoulder flexion  3+/5 4-/5  3-/5 3-/5  Shoulder abduction 3-/5 3+/5 3+/5 3-/5 3+/5 3+/5  Shoulder adduction        Shoulder extension        Shoulder internal rotation        Shoulder external rotation        Middle trapezius        Lower trapezius        Elbow flexion 4/5 4/5 4/5 4/5 4/5 4/5  Elbow extension 4/5 4/5 4/5 2+/5 3-/5 3-/5  Wrist flexion 3-/5 3-/5 3-/5 4-/5 4-/5 4-/5  Wrist extension 3-/5 3/5 3+/5 4-/5 4-/5 4-/5  Wrist ulnar deviation        Wrist radial deviation        Wrist pronation        Wrist supination        (Blank rows = not tested)  HAND FUNCTION: 05/30/24: Grip strength: Right: 8 lbs; Left: 12 lbs, Lateral pinch: Right: 4 lbs, Left: 3 lbs, and 3 point pinch: Right: 3 lbs, Left: 3 lbs  06/27/24: Grip strength: Right: 11 lbs; Left: 12 lbs, Lateral pinch: Right: 4 lbs, Left: 3 lbs, and 3 point pinch: Right: 3 lbs, Left: 3 lbs  08/07/24: Grip strength: Right: 15 lbs; Left: 18 lbs, Lateral pinch: Right: 10 lbs, Left: 9 lbs, and 3 point pinch: Right: 5 lbs, Left: 3 lbs   COORDINATION:  COORDINATION: Eval:   9 Hole Peg test: Right: 48 sec; Left: 49 sec  06/27/24:  9 Hole Peg test: Right: 33 sec; Left: 49 sec  08/07/24:   9 Hole Peg test: Right: 31 sec; Left: 44 sec.  SENSATION: TBD   EDEMA: N/A    COGNITION: Overall cognitive status: Within functional limits for tasks assessed  VISION: Subjective report: No change from baseline  PERCEPTION: [facility]  TREATMENT DATE: 08/21/24  Therapeutic Ex.:    -Performed AROM, followed by PROM to the end range for bilateral shoulder flexion, abduction, external rotation, horizontal abduction. Emphasis was placed on slow prolonged gentle stretching 2/2 stiffness and soreness. -Pt. tolerated bilateral AROM/AAROM/PROM for right 2nd digit, and bilateral 5th digit MP, PIP, and DIP flexion to prepare for formulating a composite fist. -Performed BUE strengthening using a 2# hand weight for elbow flexion, and extension, forearm supination/pronation, wrist flexion/extension, and radial deviation for 1-2 sets 10 reps each with cues, and assist for proper form, and technique.  Rest breaks were required during the task.      PATIENT EDUCATION: Education details: digit ROM, UE strengthening Person educated: Patient Education method: Explanation, Demonstration, Tactile cues, and Verbal cues Education comprehension: verbalized understanding, returned demonstration, verbal cues required, tactile cues required, and needs further education  HOME EXERCISE PROGRAM:    -Bilateral elbow flexion, and extension with yellow theraband.  -Yellow theraputty strengthening  following a HEP through [employer]. -contrasting heat 3 min., followed by 1 min. Cold  for 1 min. Ending with heat for 3 min. To the right hand. (Pt. Prefers just heat at home), moist heat to the left hand. -Massage to the right hand  -Right hand digit extension with the hand flat at the tabletop surface -Blue resistive foam block for Bilateral gross gripping, lateral pinch, and 3pt. Pinch strength -UE shoulder stabilization in supine with the shoulder flexed to 90 degrees with the elbow extended-formulating the alphabet    GOALS: Goals reviewed with patient? Yes  SHORT TERM GOALS: Target date: 07/07/2024     Pt. Will be independent with HEPs for UE strength, and coordination Baseline: 06/27/24: Independent; continue Eval: No current HEP Goal status:  Ongoing   LONG TERM GOALS: Target date: 08/18/2024   To reduce pain by 3 grades on the pain scale in preparation for functional hand use during ADLs/IADLs Baseline: 06/27/24: 0/10 pain in the right hand. Pt. reports discomfort intermittently with left 5th digit flexion. Eval:  5/10 pain in the right hand Goal status: Progressed; Achieved  2.  Pt. Will formulate a full composite fist in preparation for securely holding kitchen/cooking utensils. Baseline:08/07/24: Digit flexion to the [facility]: Right: 2nd: 3.5cm(1.5cm) 3rd: 0cm(0cm)  4th: 1.5cm (0cm) 5th: 5cm(1.5cm) Left: 2nd: 0cm(0cm) 3rd: o)m(0cm) 4th: 0cm(0cm) 5th: 4.5cm(1.5cm) 06/27/24: Digit flexion to the [facility]: Right: 2nd: 4.5cm(2cm) 3rd: 2.5cm(0cm)  4th: 2cm (0cm) 5th: 5cm(2cm) Left: 2nd: 1cm(1cm) 3rd: 1.58m(1cm) 4th: 1.5cm(1cm) 5th: 5cm(1.5cm) Pt. Had difficulty with holding and using utensils during self-feeding, Pt. Is improving with larger cooking utensilsEval: Digit flexion to the [facility]:Mphyu: 2nd: 5cm(2cm) 3rd: 0cm  4th: 0cm 5th: 5cm(3cm) Left: 2nd: 1cm(1cm) 3rd: 2cm(0cm) 4th: 4cm(0cm) 5th: 5cm(3cm) Pt, has difficulty securely holding cooking utensils in her right hand Goal status: Progressing, Ongoing  3.  Pt. Will efficiently perform hand to mouth patterns during self-feeding with modified independence using her right hand. Baseline: 08/07/24: Pt. continues to have difficulty bringing her spoon all the way to her mouth, continues to need to bring her mouth down to her spoon.06/27/24: Pt. continues to have difficulty bringing her spoon all the way to her mouth, often needing to bring her mouth down to her spoon. Eval: Pt. Has difficulty completing hand to mouth patterns, often bringing her mouth down to meet the spoon 2/2 weakness in the RUE/hand Goal status: Progressing, Ongoing  4.  Pt. Will cut meat on her plate efficiently with modified independence Baseline: 08/07/24: Pt. reports that she is  now able to cut meat. 06/27/24: Pt. Is improving  with cutting meat, over continues to have difficulty cutting meat. Eval: Pt. Has difficulty cutting meat Goal status: Achieved  5.  Pt. Will increase bilateral shoulder abduction by 10 degrees be able to efficiently perform hair care Baseline: 08/07/24:  Shoulder abduction: Right: 118(120), Left: 107(112) Pt. is now able to brush reach up further to the back of her head.06/27/24: Shoulder abduction: Right: 109(120), Left: 107(112) Pt. is able to reach up further to her head, however has difficulty sustaining BUE in elevation while performing hair care. Eval: shoulder abduction: Right: 82(120), Left: 72(102) Goal status: Progressing  6.  Pt. Will write one sentence efficiently  with 75% in preparation for being able to fill in crossword puzzles. Baseline: 08/07/24: Continue 06/27/24: Name only with 90% legibility; one sentence completed with 50% legibility Eval: writing legibility: 75% for name only. Goal status: INITIAL  7. Pt. Will improve left elbow extension to be able to efficiently reach out for items on the table.    Baseline: Elbow extension: -26(-13)    Goal status: New    8. Pt. Will improve BUE strength to assist with ADLs, and IADL tasks  Baseline: 08/07/24: UE strength: Right Shoulder flexion: 4-/5, abduction: 3+/5, elbow flexion: 4/5, extension: 4/5, wrist flexion: 3-/5, extension: 3+/5; Left Shoulder flexion: 3-/5, abduction: 3+/5, elbow flexion: 4/5, extension: 3-/5, wrist flexion: 4-/5, extension: 4-/5  Goal status: New  9. Pt. will improve bilateral grip strength by 5# to be able to hold items more securely for ADLs/IADLs  Baseline: 08/07/24: Grip strength: Right: 15# Left: 18#  Goal status: New  10.  Pt. Will improve bilateral [facility] skills in order to be able to manipulate small objects  Baseline: 08/07/24: 9 Hole Peg test: Right: 31 sec; Left: 44 sec.   Goal status: New   ASSESSMENT:  CLINICAL IMPRESSION:  Pt. Required increased time, and reps for stretching 2/2  stiffness. Pt. continues to require cues, and assist for form and technique during UE exercises when using the hand weights. Pt. continues to benefit from OT services to work on improving BUE functioning in order to increase engagement of the UEs during ADLs, and IADL tasks.    PERFORMANCE DEFICITS: in functional skills including ADLs, IADLs, coordination, dexterity, ROM, strength, pain, Fine motor control, Gross motor control, and UE functional use, , and psychosocial skills including coping strategies, environmental adaptation, habits, interpersonal interactions, and routines and behaviors.   IMPAIRMENTS: are limiting patient from ADLs, IADLs, and leisure.   CO-MORBIDITIES: may have co-morbidities  that affects occupational performance. Patient will benefit from skilled OT to address above impairments and improve overall function.  MODIFICATION OR ASSISTANCE TO COMPLETE EVALUATION: Min-Moderate modification of tasks or assist with assess necessary to complete an evaluation.  OT OCCUPATIONAL PROFILE AND HISTORY: Detailed assessment: Review of records and additional review of physical, cognitive, psychosocial history related to current functional performance.  CLINICAL DECISION MAKING: Moderate - several treatment options, min-mod task modification necessary  REHAB POTENTIAL: Good  EVALUATION COMPLEXITY: Moderate   PLAN:  OT FREQUENCY: 2x/week  OT DURATION: 12 weeks  PLANNED INTERVENTIONS: 97168 OT Re-evaluation, 97535 self care/ADL training, 02889 therapeutic exercise, 97530 therapeutic activity, 97112 neuromuscular re-education, 97140 manual therapy, 97018 paraffin, 02989 moist heat, 97010 cryotherapy, 97034 contrast bath, 97760 Orthotic Initial, 97763 Orthotic/Prosthetic subsequent, passive range of motion, balance training, functional mobility training, energy conservation, coping strategies training, patient/family education, and DME and/or AE instructions  RECOMMENDED OTHER SERVICES:  PT for  balance  CONSULTED AND AGREED WITH PLAN OF CARE: Patient  PLAN FOR NEXT SESSION:  Assess sensation, and hand function skills at the next visit, as well administer the MAM-20 outcome measure; treatment  Richardson Otter, MS, OTR/L  08/21/2024, 10:05 AM

## 2024-08-23 ENCOUNTER — Ambulatory Visit: Admitting: Physical Therapy

## 2024-08-23 ENCOUNTER — Ambulatory Visit: Admitting: Occupational Therapy

## 2024-08-23 DIAGNOSIS — R278 Other lack of coordination: Secondary | ICD-10-CM

## 2024-08-23 DIAGNOSIS — R2681 Unsteadiness on feet: Secondary | ICD-10-CM

## 2024-08-23 DIAGNOSIS — M6281 Muscle weakness (generalized): Secondary | ICD-10-CM

## 2024-08-23 DIAGNOSIS — R262 Difficulty in walking, not elsewhere classified: Secondary | ICD-10-CM

## 2024-08-23 NOTE — Therapy (Signed)
 Occupational Therapy Treatment Note   Patient Name: Maria Lowe MRN: [id number] DOB:09-22-1930, 89 y.o., female Today's Date: 08/23/2024   REFERRING PROVIDER: Katrinka Senior, MD  END OF SESSION:   OT End of Session - 08/23/24 1023     Visit Number 24    Number of Visits 48    Date for Recertification  10/30/24    OT Start Time 0845    OT Stop Time 0930    OT Time Calculation (min) 45 min    Activity Tolerance Patient tolerated treatment well    Behavior During Therapy WFL for tasks assessed/performed           Past Medical History:  Diagnosis Date   Anticoagulant long-term use    eliquis -- managed by cardiology   Arthritis    knees   CKD (chronic kidney disease), stage III (HCC)    CLL (chronic lymphocytic leukemia) (HCC) 11/27/2013   oncologist-- dr lonn;  initial dx 03/ 2015, no treatment, active survillance   Essential hypertension    followed by pcp   Full dentures    History of gastric ulcer    stomach ulcer when young   Lymphocytosis 2009   followed oncology   Permanent atrial fibrillation (HCC) 11/2020   cardiologist-- primary dr w. barbaraann and followed by atrial clinic;    dx 003/ 2022, atenolol  for rate control and on eliquis    Rectocele    Thrombocythemia 2009   related to CLL, followed by dr lonn   Urinary incontinence    Vaginal atrophy    Past Surgical History:  Procedure Laterality Date   APPENDECTOMY     1980s   CATARACT EXTRACTION W/ INTRAOCULAR LENS IMPLANT Bilateral 2020   RECTOCELE REPAIR N/A 08/25/2021   Procedure: POSTERIOR REPAIR (RECTOCELE);  Surgeon: Jertson, Jill Evelyn, MD;  Location: [facility];  Service: Gynecology;  Laterality: N/A;   TONSILLECTOMY AND ADENOIDECTOMY     age 80   Patient Active Problem List   Diagnosis Date Noted   Severe mitral regurgitation 05/23/2024   Deficiency anemia 04/21/2024   Dehydration 04/21/2024   Acute left-sided low back pain with left-sided sciatica 06/25/2022    H/O rectocele repair 08/25/2021   Secondary hypercoagulable state 12/10/2020   Persistent atrial fibrillation (HCC) 11/29/2020   Hyperlipidemia, unspecified 04/07/2018   CKD (chronic kidney disease), stage III (HCC) 02/09/2018   Rash 02/09/2018   Seborrheic dermatitis 01/08/2017   Former smoker 09/30/2015   Rectocele 03/29/2015   Thrombocytopenia 11/26/2014   CLL (chronic lymphocytic leukemia) (HCC) 11/27/2013   Hyperglycemia 05/08/2013   Essential hypertension, benign 11/07/2012   Obesity, unspecified 11/07/2012    ONSET DATE: 01/2024  REFERRING DIAG: Right hand weakness  THERAPY DIAG:  Muscle weakness (generalized)  Other lack of coordination  Rationale for Evaluation and Treatment: Rehabilitation  SUBJECTIVE:   SUBJECTIVE STATEMENT:  Pt. reports no pain today.  Accompanied by self; with daughter in the reception area  PERTINENT HISTORY:  Pt. was referred for outpatient OT services for right hand pain. Pt. had received a cat bite approximately one week prior to her going on a trip to [place]. She was hospitalized in [place] for one month due to developing a Cat Bite Pasturella Infection. While in the hospital, Pt. reports that she was placed in an arm cast. Pt. reports that while she was in the hospital she was bedbound, was not allowed to get out of bed for the entire month, and became weak, and deconditioned. Pt. Reports that she  does not know why she was placed in a cast. [place] return [place] the [place] Pt. started home health OT and PT services, however OT services were limited due to staffing constraints. Pt. was then referred to outpatient OT services. PMHx includes: Severe mitral valve regurgitation, pericadrial, and pleural effusions, A-Fib, Chronic Lymphocytic Anemia, multifactorial anemia and CKD stage III, Diverticulosis, constipation, HTN, Hyperlipidemia, Prediabetes.  PRECAUTIONS: None  WEIGHT BEARING RESTRICTIONS: No  PAIN:  Are you having pain?  No pain  FALLS: Has  patient fallen in last 6 months? 1 fall in the last 6 months  LIVING ENVIRONMENT: Lives with: Resides with her daughter Lives in: House- one level Stairs: ramped entrance Has following equipment at home: Counselling psychologist, Environmental Consultant - 4 wheeled, shower chair, bed side commode, and Grab bars, hospital bed, elevated toilet  PLOF: Independent, living alone, managing a household-per report  PATIENT GOALS:  To be able to cut her meat  OBJECTIVE:  Note: Objective measures were completed at Evaluation unless otherwise noted.  HAND DOMINANCE: Right  ADLs:  Transfers/ambulation related to ADLs: Supervision with a rollator walker Eating: Pt. has difficulty with using utensils with the right hand, often bringing her head down to meet hand/utensil when eating. Pt. Has  weakness with bringing the cup to her mouth. Difficulty with cutting food. Grooming: Difficulty sustaining the BUE's in up long enough to perform hair care. Independent with oral care,  UB Dressing: Independent, requires increased time to complete. Pt. Does endorse having difficulty reaching up to put a shirt on over her head. LB Dressing:Reports being able to perform LE dressing, Uses slide-on footwear.  Toileting:Pt. Reports independently being able to perform toileting care needs using her left hand. (Of note, Pt. Reports that she has always used her left hand to perform toilet hygiene care.) Bathing: Independent, daughter provides supervision from outside the shower curtain. Daughter assists with drying her back due to limited reach Tub Shower transfers: Daughter provides supervision tub/shower t/f's   IADLs: Light housekeeping: Pt. reports that she has resumed some light housekeeping tasks, however this has been limited. Pt. Reports that she does not vacuum, or sweep due to rollator use. Meal Prep: Pt. Has resumed weekly cooking on Sundays. Reports having difficulty cutting onions, as well as difficulty holding and using a  beater. Pt. Reports that she uses a modified technique to hold kitchen/cooking utensils. Community mobility: Pt. Relies on family  and friends for transportation Medication management: Pt. Has difficulty manipulating medication efficiently with the right hand Handwriting: 50% legible for name only Leisure/Hobbies: Loves to cook; enjoys reading , and working crossword puzzles.  MOBILITY STATUS:  Supervision using a rollator  FUNCTIONAL OUTCOME MEASURES: 05/30/24: MAM-20: 11/20 items were completed. The following items Pt. indicated as a 2 -very hard to do: wringing out a towel, opening a wide mouth bottle-previously opened, cutting meat on a plate, tying shoe laces, zipping a jacket, and squeezing toothpaste on a toothbrush. The following items Pt. indicated as a 1-cannot do: opening a medication bottle with a childproof cap, cutting nails with a clipper, buttoning clothing, picking up a 1/2 full water pitcher, and writing 3-4 lines legibly.  UPPER EXTREMITY ROM:     ROM Right eval Right 06/27/24 Right 08/07/24 Left eval Left 06/27/24 Left 08/07/24  Shoulder flexion 130(150) Scaption 134(150) 144(150)  100 scaption 100 scaption (120) flexion  Shoulder abduction 82(120) 109(120) 118(120) 72(102) 892(887) 107(118)  Shoulder adduction        Shoulder extension  Shoulder internal rotation        Shoulder external rotation        Elbow flexion [facility] WFL 148 WFL WFL  Elbow extension [facility] WFL  -60(-28) -35(-18) -26(-13)  Wrist flexion 26(60) 30(60) 36(60) WFL WFL 60(70)  Wrist extension 44(62) 46(62) 52(62) WFL WFL 70  Wrist ulnar deviation        Wrist radial deviation        Wrist pronation        Wrist supination        (Blank rows = not tested)  Eval: Digit flexion to the [facility]:    Right: 2nd: 5cm(2cm) 3rd: 0cm  4th: 0cm 5th: 5cm(3cm)     Left: 2nd: 1cm(1cm) 3rd: 2cm(0cm) 4th: 4cm(0cm) 5th: 5cm(3cm)    06/27/24:  Digit flexion to the [facility]:    Right: 2nd: 4.5cm(2cm) 3rd:  2.5cm(0cm)  4th: 2cm (0cm) 5th: 5cm(2cm)     Left: 2nd: 1cm(1cm) 3rd: 1.73m(1cm) 4th: 1.5cm(1cm) 5th: 5cm(1.5cm)   08/07/24:  Digit flexion to the [facility]:    Right: 2nd: 3.5cm(1.5cm) 3rd: 0cm(0cm)  4th: 1.5cm (0cm) 5th: 5cm(1.5cm)     Left: 2nd: 0cm(0cm) 3rd: o)m(0cm) 4th: 0cm(0cm) 5th: 4.5cm(1.5cm)  UPPER EXTREMITY MMT:     MMT Right eval Right  06/27/24 Right 08/07/24 Left eval Left 06/27/24 Left 08/07/24  Shoulder flexion  3+/5 4-/5  3-/5 3-/5  Shoulder abduction 3-/5 3+/5 3+/5 3-/5 3+/5 3+/5  Shoulder adduction        Shoulder extension        Shoulder internal rotation        Shoulder external rotation        Middle trapezius        Lower trapezius        Elbow flexion 4/5 4/5 4/5 4/5 4/5 4/5  Elbow extension 4/5 4/5 4/5 2+/5 3-/5 3-/5  Wrist flexion 3-/5 3-/5 3-/5 4-/5 4-/5 4-/5  Wrist extension 3-/5 3/5 3+/5 4-/5 4-/5 4-/5  Wrist ulnar deviation        Wrist radial deviation        Wrist pronation        Wrist supination        (Blank rows = not tested)  HAND FUNCTION: 05/30/24: Grip strength: Right: 8 lbs; Left: 12 lbs, Lateral pinch: Right: 4 lbs, Left: 3 lbs, and 3 point pinch: Right: 3 lbs, Left: 3 lbs  06/27/24: Grip strength: Right: 11 lbs; Left: 12 lbs, Lateral pinch: Right: 4 lbs, Left: 3 lbs, and 3 point pinch: Right: 3 lbs, Left: 3 lbs  08/07/24: Grip strength: Right: 15 lbs; Left: 18 lbs, Lateral pinch: Right: 10 lbs, Left: 9 lbs, and 3 point pinch: Right: 5 lbs, Left: 3 lbs   COORDINATION:  COORDINATION: Eval:   9 Hole Peg test: Right: 48 sec; Left: 49 sec  06/27/24:  9 Hole Peg test: Right: 33 sec; Left: 49 sec  08/07/24:   9 Hole Peg test: Right: 31 sec; Left: 44 sec.  SENSATION: TBD   EDEMA: N/A    COGNITION: Overall cognitive status: Within functional limits for tasks assessed  VISION: Subjective report: No change from baseline  PERCEPTION: [facility]  TREATMENT DATE: 08/23/24  Therapeutic Ex.:    -Performed AROM, followed by PROM to the end range for bilateral shoulder flexion, abduction, external rotation, horizontal abduction. Emphasis was placed on slow prolonged gentle stretching 2/2 stiffness and soreness. -Pt. tolerated bilateral AROM/AAROM/PROM for right bilateral 5th digit MP, PIP, and DIP flexion to prepare for formulating a composite fist. -Performed BUE strengthening using a 1# hand weight for elbow flexion, and extension, forearm supination/pronation, wrist flexion/extension, and radial deviation for 1 rep 10-20 reps each with cues, with less  assist requiredfor proper form, and technique.    Therapeutic Activities:   -Facilitated bilateral UE functional reaching tasks grasping flat shapes, and moving them through multiple vertical dowels of progressively increasing heights. Pt. required support proximally, and assist was adjusted as needed.   PATIENT EDUCATION: Education details: digit ROM, UE strengthening Person educated: Patient Education method: Explanation, Demonstration, Tactile cues, and Verbal cues Education comprehension: verbalized understanding, returned demonstration, verbal cues required, tactile cues required, and needs further education  HOME EXERCISE PROGRAM:    -Bilateral elbow flexion, and extension with yellow theraband.  -Yellow theraputty strengthening  following a HEP through [employer]. -contrasting heat 3 min., followed by 1 min. Cold  for 1 min. Ending with heat for 3 min. To the right hand. (Pt. Prefers just heat at home), moist heat to the left hand. -Massage to the right hand  -Right hand digit extension with the hand flat at the tabletop surface -Blue resistive foam block for Bilateral gross gripping, lateral pinch, and 3pt. Pinch strength -UE shoulder stabilization in supine with the shoulder flexed to 90 degrees with the elbow extended-formulating the alphabet     GOALS: Goals reviewed with patient? Yes  SHORT TERM GOALS: Target date: 07/07/2024     Pt. Will be independent with HEPs for UE strength, and coordination Baseline: 06/27/24: Independent; continue Eval: No current HEP Goal status: Ongoing   LONG TERM GOALS: Target date: 08/18/2024   To reduce pain by 3 grades on the pain scale in preparation for functional hand use during ADLs/IADLs Baseline: 06/27/24: 0/10 pain in the right hand. Pt. reports discomfort intermittently with left 5th digit flexion. Eval:  5/10 pain in the right hand Goal status: Progressed; Achieved  2.  Pt. Will formulate a full composite fist in preparation for securely holding kitchen/cooking utensils. Baseline:08/07/24: Digit flexion to the [facility]: Right: 2nd: 3.5cm(1.5cm) 3rd: 0cm(0cm)  4th: 1.5cm (0cm) 5th: 5cm(1.5cm) Left: 2nd: 0cm(0cm) 3rd: o)m(0cm) 4th: 0cm(0cm) 5th: 4.5cm(1.5cm) 06/27/24: Digit flexion to the [facility]: Right: 2nd: 4.5cm(2cm) 3rd: 2.5cm(0cm)  4th: 2cm (0cm) 5th: 5cm(2cm) Left: 2nd: 1cm(1cm) 3rd: 1.7m(1cm) 4th: 1.5cm(1cm) 5th: 5cm(1.5cm) Pt. Had difficulty with holding and using utensils during self-feeding, Pt. Is improving with larger cooking utensilsEval: Digit flexion to the [facility]:Mphyu: 2nd: 5cm(2cm) 3rd: 0cm  4th: 0cm 5th: 5cm(3cm) Left: 2nd: 1cm(1cm) 3rd: 2cm(0cm) 4th: 4cm(0cm) 5th: 5cm(3cm) Pt, has difficulty securely holding cooking utensils in her right hand Goal status: Progressing, Ongoing  3.  Pt. Will efficiently perform hand to mouth patterns during self-feeding with modified independence using her right hand. Baseline: 08/07/24: Pt. continues to have difficulty bringing her spoon all the way to her mouth, continues to need to bring her mouth down to her spoon.06/27/24: Pt. continues to have difficulty bringing her spoon all the way to her mouth, often needing to bring her mouth down to her spoon. Eval: Pt. Has difficulty completing hand to mouth patterns, often bringing her mouth down to meet  the spoon 2/2 weakness in the  RUE/hand Goal status: Progressing, Ongoing  4.  Pt. Will cut meat on her plate efficiently with modified independence Baseline: 08/07/24: Pt. reports that she is now able to cut meat. 06/27/24: Pt. Is improving with cutting meat, over continues to have difficulty cutting meat. Eval: Pt. Has difficulty cutting meat Goal status: Achieved  5.  Pt. Will increase bilateral shoulder abduction by 10 degrees be able to efficiently perform hair care Baseline: 08/07/24:  Shoulder abduction: Right: 118(120), Left: 107(112) Pt. is now able to brush reach up further to the back of her head.06/27/24: Shoulder abduction: Right: 109(120), Left: 107(112) Pt. is able to reach up further to her head, however has difficulty sustaining BUE in elevation while performing hair care. Eval: shoulder abduction: Right: 82(120), Left: 72(102) Goal status: Progressing  6.  Pt. Will write one sentence efficiently  with 75% in preparation for being able to fill in crossword puzzles. Baseline: 08/07/24: Continue 06/27/24: Name only with 90% legibility; one sentence completed with 50% legibility Eval: writing legibility: 75% for name only. Goal status: INITIAL  7. Pt. Will improve left elbow extension to be able to efficiently reach out for items on the table.    Baseline: Elbow extension: -26(-13)    Goal status: New    8. Pt. Will improve BUE strength to assist with ADLs, and IADL tasks  Baseline: 08/07/24: UE strength: Right Shoulder flexion: 4-/5, abduction: 3+/5, elbow flexion: 4/5, extension: 4/5, wrist flexion: 3-/5, extension: 3+/5; Left Shoulder flexion: 3-/5, abduction: 3+/5, elbow flexion: 4/5, extension: 3-/5, wrist flexion: 4-/5, extension: 4-/5  Goal status: New  9. Pt. will improve bilateral grip strength by 5# to be able to hold items more securely for ADLs/IADLs  Baseline: 08/07/24: Grip strength: Right: 15# Left: 18#  Goal status: New  10.  Pt. Will improve bilateral [facility]  skills in order to be able to manipulate small objects  Baseline: 08/07/24: 9 Hole Peg test: Right: 31 sec; Left: 44 sec.   Goal status: New   ASSESSMENT:  CLINICAL IMPRESSION:  Pt. reported fatigue following PT today. Pt. required fewer cues, and assist for form and technique during the hand weight exercises. Pt. required support and assist proximally with the RUE during functional reaching tasks.  Pt. continues to benefit from OT services to work on improving BUE functioning in order to increase engagement of the UEs during ADLs, and IADL tasks.    PERFORMANCE DEFICITS: in functional skills including ADLs, IADLs, coordination, dexterity, ROM, strength, pain, Fine motor control, Gross motor control, and UE functional use, , and psychosocial skills including coping strategies, environmental adaptation, habits, interpersonal interactions, and routines and behaviors.   IMPAIRMENTS: are limiting patient from ADLs, IADLs, and leisure.   CO-MORBIDITIES: may have co-morbidities  that affects occupational performance. Patient will benefit from skilled OT to address above impairments and improve overall function.  MODIFICATION OR ASSISTANCE TO COMPLETE EVALUATION: Min-Moderate modification of tasks or assist with assess necessary to complete an evaluation.  OT OCCUPATIONAL PROFILE AND HISTORY: Detailed assessment: Review of records and additional review of physical, cognitive, psychosocial history related to current functional performance.  CLINICAL DECISION MAKING: Moderate - several treatment options, min-mod task modification necessary  REHAB POTENTIAL: Good  EVALUATION COMPLEXITY: Moderate   PLAN:  OT FREQUENCY: 2x/week  OT DURATION: 12 weeks  PLANNED INTERVENTIONS: 97168 OT Re-evaluation, 97535 self care/ADL training, 02889 therapeutic exercise, 97530 therapeutic activity, 97112 neuromuscular re-education, 97140 manual therapy, 97018 paraffin, 02989 moist heat, 97010 cryotherapy, 97034  contrast bath, 97760 Orthotic  Initial, 02236 Orthotic/Prosthetic subsequent, passive range of motion, balance training, functional mobility training, energy conservation, coping strategies training, patient/family education, and DME and/or AE instructions  RECOMMENDED OTHER SERVICES: PT for balance  CONSULTED AND AGREED WITH PLAN OF CARE: Patient  PLAN FOR NEXT SESSION:  Assess sensation, and hand function skills at the next visit, as well administer the MAM-20 outcome measure; treatment  Richardson Otter, MS, OTR/L  08/23/2024, 10:39 AM

## 2024-08-23 NOTE — Therapy (Signed)
 OUTPATIENT PHYSICAL THERAPY NEURO TREATMENT   Patient Name: Maria Lowe MRN: [id number] DOB:1931/04/27, 89 y.o., female Today's Date: 08/23/2024   PCP: Garnette KIDD. Katrinka, MD REFERRING PROVIDER: Garnette KIDD. Katrinka, MD  END OF SESSION:  PT End of Session - 08/23/24 0832     Visit Number 5    Number of Visits 24    Date for Recertification  10/25/24    PT Start Time 0845    PT Stop Time 0925    PT Time Calculation (min) 40 min    Equipment Utilized During Treatment Gait belt    Activity Tolerance Patient tolerated treatment well    Behavior During Therapy WFL for tasks assessed/performed          Past Medical History:  Diagnosis Date   Anticoagulant long-term use    eliquis -- managed by cardiology   Arthritis    knees   CKD (chronic kidney disease), stage III (HCC)    CLL (chronic lymphocytic leukemia) (HCC) 11/27/2013   oncologist-- dr lonn;  initial dx 03/ 2015, no treatment, active survillance   Essential hypertension    followed by pcp   Full dentures    History of gastric ulcer    stomach ulcer when young   Lymphocytosis 2009   followed oncology   Permanent atrial fibrillation (HCC) 11/2020   cardiologist-- primary dr w. barbaraann and followed by atrial clinic;    dx 003/ 2022, atenolol  for rate control and on eliquis    Rectocele    Thrombocythemia 2009   related to CLL, followed by dr lonn   Urinary incontinence    Vaginal atrophy    Past Surgical History:  Procedure Laterality Date   APPENDECTOMY     1980s   CATARACT EXTRACTION W/ INTRAOCULAR LENS IMPLANT Bilateral 2020   RECTOCELE REPAIR N/A 08/25/2021   Procedure: POSTERIOR REPAIR (RECTOCELE);  Surgeon: Jertson, Jill Evelyn, MD;  Location: [facility];  Service: Gynecology;  Laterality: N/A;   TONSILLECTOMY AND ADENOIDECTOMY     age 53   Patient Active Problem List   Diagnosis Date Noted   Severe mitral regurgitation 05/23/2024   Deficiency anemia 04/21/2024   Dehydration  04/21/2024   Acute left-sided low back pain with left-sided sciatica 06/25/2022   H/O rectocele repair 08/25/2021   Secondary hypercoagulable state 12/10/2020   Persistent atrial fibrillation (HCC) 11/29/2020   Hyperlipidemia, unspecified 04/07/2018   CKD (chronic kidney disease), stage III (HCC) 02/09/2018   Rash 02/09/2018   Seborrheic dermatitis 01/08/2017   Former smoker 09/30/2015   Rectocele 03/29/2015   Thrombocytopenia 11/26/2014   CLL (chronic lymphocytic leukemia) (HCC) 11/27/2013   Hyperglycemia 05/08/2013   Essential hypertension, benign 11/07/2012   Obesity, unspecified 11/07/2012    ONSET DATE: May 2025  REFERRING DIAG: Severe muscle deconditioning  THERAPY DIAG:  Muscle weakness (generalized)  Unsteadiness on feet  Difficulty in walking, not elsewhere classified  Other lack of coordination  Rationale for Evaluation and Treatment: Rehabilitation  SUBJECTIVE:  SUBJECTIVE STATEMENT: Pt states she is doing well, no recent falls or updates since last visit.    Pt accompanied by: family member (daughter)  PERTINENT HISTORY:  Pt was referred to outpatient PT for muscle deconditioning. Pt experienced a cat bite that required hospitalization while she was in [place]. Daughter states she was quite ill and was on bed rest for 3 weeks, which she states contributed to her decline in function. Prior to hospitalization patient was ambulating with a cane and independent with all activities. Currently she ambulates with a rollator, but would like to work toward ambulating with a cane. Pt enjoys reading and cooking. Her goals are to ambulated with LRAD, navigate stairs with confidence, and increase tolerance for cooking.   PAIN:  Are you having pain? Occasional arthritic pain, primarily right  knee  PRECAUTIONS: None  RED FLAGS: None   WEIGHT BEARING RESTRICTIONS: No  FALLS: Has patient fallen in last 6 months? Yes. Number of falls 3   LIVING ENVIRONMENT: Lives with: lives with their daughter Lives in: House/apartment Stairs: Yes; two steps at daughters house/ currently ramped Has following equipment at home: Counselling psychologist, Environmental Consultant - 4 wheeled, shower chair, Grab bars, and hospital bed  PLOF: Independent with basic ADLs   PATIENT GOALS: Pt would like to work on getting in and out of chairs, increasing tolerance for cooking, and using LRAD.    OBJECTIVE:  Note: Objective measures were completed at Evaluation unless otherwise noted.   COGNITION: Overall cognitive status: Within functional limits for tasks assessed    LOWER EXTREMITY ROM:     Active  Right Eval Left Eval  Hip flexion    Hip extension    Hip abduction    Hip adduction    Hip internal rotation    Hip external rotation    Knee flexion    Knee extension    Ankle dorsiflexion    Ankle plantarflexion    Ankle inversion    Ankle eversion     (Blank rows = not tested)  LOWER EXTREMITY MMT:    MMT Right Eval Left Eval  Hip flexion 4+ 4+  Hip extension    Hip abduction 3+ 3+  Hip adduction 3+ 3+  Hip internal rotation    Hip external rotation    Knee flexion 4+ 4+  Knee extension 4+ 4+  Ankle dorsiflexion 4 4  Ankle plantarflexion 3+ 3+  Ankle inversion    Ankle eversion    (Blank rows = not tested)  BED MOBILITY:  Pt reports no difficulty with bed mobility, but utilizes a hospital bed  RAMP:  Pt reports unsteadiness with ramps  STAIRS: To be assessed at next visit GAIT: Findings: Distance walked: 63ft, Assistive device utilized:Walker - 4 wheeled  FUNCTIONAL TESTS:  5 times sit to stand: 16.95 sec completed with UE support Timed up and go (TUG): 23.59 sec completed w/ 5TT 10 meter walk test: 16.34 sec; 0.61 m/s completed w/ 5TT 6 minute walk test: completed 5:07  min, 612 ft completed w/ 5TT Item Test date: 08/07/24  Sitting to standing 2. able to stand using hands after several tries  2. Standing unsupported 4. able to stand safely for 2 minutes  3. Sitting with back unsupported, feet supported 4. able to sit safely and securely for 2 minutes  4. Standing to sitting 4. sits safely with minimal use of hands  5. Pivot transfer  3. able to transfer safely with definite need of hands  6. Standing  unsupported with eyes closed 3. able to stand 10 seconds with supervision  7. Standing unsupported with feet together 1. needs help to attain position but able to stand 15 seconds feet together  8. Reaching forward with outstretched arms while standing 2. can reach forward 5 cm (2 inches)  9. Pick up object from the floor from standing 1. unable to pick up and needs supervision while trying  10. Turning to look behind over left and right shoulders while standing 1. needs supervision when turning  11. Turn 360 degrees 1. needs close supervision or verbal cuing  12. Place alternate foot on step or stool while standing unsupported 0. needs assistance to keep from falling/unable to try  13. Standing unsupported one foot in front 0. loses balance while stepping or standing  14. Standing on one leg 0. unable to try of needs assist to prevent fall    Total Score 26/56     PATIENT SURVEYS:  ABC scale: 37.5%                                                                                                                              TREATMENT DATE: 08/23/24   TherAct: To improve functional movements patterns for everyday tasks  3 rounds: Gait x 1101ft with SPC  -Short standing rest during each round STS: x 6 -Intermittent hands to knees when becoming fatigued -Verbal cues to lean forward to unweight hips to facilitate ease of sit to stand Ascend/descend stairs with single rail use, step to pattern x 1 (4 steps)  TE- To improve strength, endurance, mobility, and  function of specific targeted muscle groups or improve joint range of motion or improve muscle flexibility   Standing exercises with 2# AW's:   Marches: 2 x 12 ea LE  Heel raises 1 x 20   Seated bilateral hip abduction: 1 x 10 RTB, 2 x 10 GTB   -Progressed to GTB after 1 set  Seated toe raises on incline with GTB: 2 x12   Unless otherwise stated, CGA was provided and gait belt donned in order to ensure pt safety   Pt required occasional rest breaks due fatigue, PT was attentive to when pt appeared to be tired or winded in order to prevent excessive fatigue.   PATIENT EDUCATION: Education details: POC Person educated: Patient Education method: Explanation Education comprehension: verbalized understanding   HOME EXERCISE PROGRAM: Establish visit 2     GOALS: Goals reviewed with patient? Yes  SHORT TERM GOALS: Target date: 09/13/2024  Patient will be independent with HEP to improve strength and mobility for better functional independence with ADL's Baseline: Establish at second visit Goal status: INITIAL   LONG TERM GOALS: Target date: 10/25/2024  Patient will improve 5xSTS to 12 seconds or less to demonstrate improved strength and balance. Baseline: 16.95 sec with UE support Goal status: INITIAL  2.  Patient will decrease TUG score by 8 seconds or more with LRAD to  indicate decreased fall risk and improved transfer ability. Baseline:  23.59 sec completed with 4WW Goal status: INITIAL  3.  Patient will improve by >1.0 m/s with LRAD for improved community ambulation and indicate a decreased fall risk. Baseline: 0.61 m/s completed with 4WW Goal status: INITIAL  4.  Patient will improve to >800 ft with LRAD for progression toward community ambulator.  Baseline: 5:07 min, 612 ft. Completed with 4WW Goal status: INITIAL  5.  Patient will improve BERG by 6 points or more to demonstrate decreased fall risk with functional activities.  Baseline: To be assessed at  visit 2 Goal status: INITIAL  6.  Patient will be able to navigate 4 steps with step to pattern using UE support and supervision in order to enter daughter's home.  Baseline: to be assessed at visit 2 Goal status: INITIAL   ASSESSMENT:  CLINICAL IMPRESSION: Pt arrived with good motivation for completion of PT activities. Today's session continued to work on gait with SPC. Pt became fatigued during each 164ft and took a short standing break, but otherwise demonstrated good 2-point pattern. Pts confidence continues to improve with stair navigation with step to pattern and single UE support. Sit to stands continue to be challenging, with verbal cues to lean forward to help unweight hips. The rest of the session focused on LE strength in standing and sitting. Sitting exercises were provided due to expressed fatigue after standing marches. Pt was encouraged to take rest breaks throughout session to prevent fatigue, but pt would begin exercise after ~15 sec rest. Pt will continue to benefit from skilled therapy to address remaining deficits in order to improve overall QoL and return to PLOF.     OBJECTIVE IMPAIRMENTS: Abnormal gait, decreased activity tolerance, decreased balance, decreased endurance, decreased knowledge of use of DME, and decreased strength.   ACTIVITY LIMITATIONS: carrying, lifting, squatting, stairs, transfers, and reach over head  PARTICIPATION LIMITATIONS: meal prep, cleaning, laundry, and community activity  PERSONAL FACTORS: Age and 3+ comorbidities: CKD, CLL, HTN are also affecting patient's functional outcome.   REHAB POTENTIAL: Good  CLINICAL DECISION MAKING: Evolving/moderate complexity  EVALUATION COMPLEXITY: Moderate  PLAN:  PT FREQUENCY: 2x/week  PT DURATION: 12 weeks  PLANNED INTERVENTIONS: 97110-Therapeutic exercises, 97530- Therapeutic activity, 97112- Neuromuscular re-education, 97535- Self Care, [id number]- Manual therapy, [id number]- Gait training,  Patient/Family education, Balance training, Stair training, Vestibular training, and DME instructions  PLAN FOR NEXT SESSION:  [facility] training for community tasks  Balance activities  [place], SPT  08/23/24, 8:35 AM

## 2024-08-25 ENCOUNTER — Inpatient Hospital Stay: Admitting: Hematology and Oncology

## 2024-08-25 ENCOUNTER — Inpatient Hospital Stay

## 2024-08-28 ENCOUNTER — Encounter: Payer: Self-pay | Admitting: Family Medicine

## 2024-08-28 ENCOUNTER — Ambulatory Visit: Admitting: Occupational Therapy

## 2024-08-28 ENCOUNTER — Ambulatory Visit: Admitting: Family Medicine

## 2024-08-28 VITALS — BP 122/72 | HR 84 | Temp 97.9°F | Ht 61.0 in | Wt 135.6 lb

## 2024-08-28 DIAGNOSIS — D539 Nutritional anemia, unspecified: Secondary | ICD-10-CM | POA: Diagnosis not present

## 2024-08-28 DIAGNOSIS — E785 Hyperlipidemia, unspecified: Secondary | ICD-10-CM | POA: Diagnosis not present

## 2024-08-28 DIAGNOSIS — I4819 Other persistent atrial fibrillation: Secondary | ICD-10-CM | POA: Diagnosis not present

## 2024-08-28 DIAGNOSIS — N183 Chronic kidney disease, stage 3 unspecified: Secondary | ICD-10-CM | POA: Diagnosis not present

## 2024-08-28 DIAGNOSIS — I10 Essential (primary) hypertension: Secondary | ICD-10-CM | POA: Diagnosis not present

## 2024-08-28 LAB — COMPREHENSIVE METABOLIC PANEL WITH GFR
ALT: 10 U/L (ref 0–35)
AST: 17 U/L (ref 0–37)
Albumin: 4.2 g/dL (ref 3.5–5.2)
Alkaline Phosphatase: 71 U/L (ref 39–117)
BUN: 25 mg/dL — ABNORMAL HIGH (ref 6–23)
CO2: 27 meq/L (ref 19–32)
Calcium: 9.4 mg/dL (ref 8.4–10.5)
Chloride: 105 meq/L (ref 96–112)
Creatinine, Ser: 0.85 mg/dL (ref 0.40–1.20)
GFR: 59.99 mL/min — ABNORMAL LOW (ref >60.00)
Glucose, Bld: 150 mg/dL — ABNORMAL HIGH (ref 70–99)
Potassium: 4.1 meq/L (ref 3.5–5.1)
Sodium: 142 meq/L (ref 135–145)
Total Bilirubin: 0.6 mg/dL (ref 0.2–1.2)
Total Protein: 6 g/dL (ref 6.0–8.3)

## 2024-08-28 LAB — CBC WITH DIFFERENTIAL/PLATELET
Basophils Absolute: 0 K/uL (ref 0.0–0.1)
Basophils Relative: 0.4 % (ref 0.0–3.0)
Eosinophils Absolute: 0.1 K/uL (ref 0.0–0.7)
Eosinophils Relative: 0.6 % (ref 0.0–5.0)
HCT: 38.4 % (ref 36.0–46.0)
Hemoglobin: 12.4 g/dL (ref 12.0–15.0)
Lymphocytes Relative: 64.8 % — ABNORMAL HIGH (ref 12.0–46.0)
Lymphs Abs: 7.6 K/uL — ABNORMAL HIGH (ref 0.7–4.0)
MCHC: 32.4 g/dL (ref 30.0–36.0)
MCV: 81.7 fl (ref 78.0–100.0)
Monocytes Absolute: 0.4 K/uL (ref 0.1–1.0)
Monocytes Relative: 3.7 % (ref 3.0–12.0)
Neutro Abs: 3.6 K/uL (ref 1.4–7.7)
Neutrophils Relative %: 30.5 % — ABNORMAL LOW (ref 43.0–77.0)
Platelets: 115 K/uL — ABNORMAL LOW (ref 150.0–400.0)
RBC: 4.7 Mil/uL (ref 3.87–5.11)
RDW: 19 % — ABNORMAL HIGH (ref 11.5–15.5)
WBC: 11.7 K/uL — ABNORMAL HIGH (ref 4.0–10.5)

## 2024-08-28 NOTE — Progress Notes (Addendum)
 Phone [phone] In person visit   Subjective:   Maria Lowe is a 89 y.o. year old very pleasant female patient who presents for/with See problem oriented charting Chief Complaint  Patient presents with   Medical Management of Chronic Issues    3 month follow up;    Past Medical History-  Patient Active Problem List   Diagnosis Date Noted   Severe mitral regurgitation 05/23/2024    Priority: High   Persistent atrial fibrillation (HCC) 11/29/2020    Priority: High   Thrombocytopenia 11/26/2014    Priority: High   CLL (chronic lymphocytic leukemia) (HCC) 11/27/2013    Priority: High   Hyperlipidemia, unspecified 04/07/2018    Priority: Medium    CKD (chronic kidney disease), stage III (HCC) 02/09/2018    Priority: Medium    Hyperglycemia 05/08/2013    Priority: Medium    Essential hypertension, benign 11/07/2012    Priority: Medium    H/O rectocele repair 08/25/2021    Priority: Low   Secondary hypercoagulable state 12/10/2020    Priority: Low   Rash 02/09/2018    Priority: Low   Seborrheic dermatitis 01/08/2017    Priority: Low   Former smoker 09/30/2015    Priority: Low   Rectocele 03/29/2015    Priority: Low   Obesity, unspecified 11/07/2012    Priority: Low   Deficiency anemia 04/21/2024   Dehydration 04/21/2024   Acute left-sided low back pain with left-sided sciatica 06/25/2022    Medications- reviewed and updated Current Outpatient Medications  Medication Sig Dispense Refill   amLODipine  (NORVASC ) 10 MG tablet TAKE 1 TABLET BY MOUTH EVERY DAY 90 tablet 3   apixaban  (ELIQUIS ) 2.5 MG TABS tablet Take 1 tablet (2.5 mg total) by mouth 2 (two) times daily. 60 tablet 5   atenolol  (TENORMIN ) 100 MG tablet TAKE 1 TABLET BY MOUTH EVERY DAY 90 tablet 3   gabapentin  (NEURONTIN ) 100 MG capsule      hydrochlorothiazide  (HYDRODIURIL ) 12.5 MG tablet TAKE 1 TABLET BY MOUTH EVERY DAY 90 tablet 3   losartan  (COZAAR ) 100 MG tablet TAKE 1 TABLET BY MOUTH EVERY DAY 90  tablet 3   Multiple Vitamins-Minerals (ONE-A-DAY WOMENS 50+ PO) Take by mouth daily.     rosuvastatin  (CRESTOR ) 5 MG tablet Take 1 tablet (5 mg total) by mouth once a week. 13 tablet 3   No current facility-administered medications for this visit.     Objective:  BP 122/72 (BP Location: Left Arm, Patient Position: Sitting, Cuff Size: Normal)   Pulse 84   Temp 97.9 F (36.6 C) (Temporal)   Ht 5' 1 (1.549 m)   Wt 135 lb 9.6 oz (61.5 kg)   SpO2 97%   BMI 25.62 kg/m  Gen: NAD, resting comfortably CV: RRR no murmurs rubs or gallops Lungs: CTAB no crackles, wheeze, rhonchi Ext: no edema Skin: warm, dry      Assessment and Plan    # Moderate left pleural effusion-noted on x-ray at last visit-appears to be noted on echocardiogram.  History of severe mitral valve regurgitation with pericardial and pleural effusions already known at time of x-ray and pending cardiology consult.  When discussed on 07/14/2024 with patient's daughter-she and daughter ultimately opted out a referral and prefers palliative approach. She reports there is no shortness of breath - family still feels like there is some shortness of breath but wants to hold off - lung sounds are normal   # Occupational Therapy referral-due to prior deconditioning and right hand weakness after  [place]-she reports also working with physical therapy and that has been helpful for mobility. Twice a week between two of these- way better than home health physical therapy    # Diverticulosis: With prior concern bmet seen - CT findings showed short segment circumferential wall thickening of distal descending colon possibly diverticulitis versus malignancy.  Symptoms improved with increased fluid intake and fiber Gummies and reduce constipation.  Patient is wanted to hold off on further evaluation with colonoscopy due to wanting to take a more palliative approach As she is also had some anemia at  baseline it seems slightly worse at last visit but she also has CLL (oncology has also weighed in and not overly concerned)-she agrees to let us  recheck today but has wanted to hold off on stool cards (confirmed again today) . Iron stores were ok as well.   #Permanent atrial fibrillation with secondary hypercoagulable state as a result S: Medication: Eliquis  5 mg twice daily--> 2.5 mg twice daily with age and kg just under 60, plan is for rate control strategy and she is on atenolol  100 mg -There was evidence of a remote lacunar infarct when she was seen in the emergency department in January 2023-could be related to prior A-fib A/P: appropriately anticoagulated and rate controlled- continue current medicine    #hypertension S: medication: Atenolol  100Mg , HCTZ 12.5Mg , losartan  100 mg, amlodipine  10 mg -wean off amlodipine  with edema BP Readings from Last 3 Encounters:  08/28/24 122/72  08/09/24 (!) 149/88  05/23/24 130/60  A/P: well controlled continue current medications    #Chronic kidney disease stage III S: GFR is typically in the 50srange -Patient knows to avoid NSAIDs  A/P: last labs looked great with GFR near 57   #CLL- sees Dr. Lonn.    Also has thrombocytopenia related to CLL- will update CBC today   #hyperlipidemia S: Medication:Rosuvastatin  5 mg weekly  -Patient initially wanted to remain off statin despite remote lacunar infarct on prior imaging January 2023 but later agreed to at least pulsed dose therapy (even if stroke could have been embolic) before stopping in 2025 again Lab Results  Component Value Date   CHOL 115 03/13/2024   HDL 36.30 (L) 03/13/2024   LDLCALC 49 03/13/2024   LDLDIRECT 81.0 06/25/2022   TRIG 150.0 (H) 03/13/2024   CHOLHDL 3 03/13/2024   A/P: well controlled on this low dose- continue current medications   # Hyperglycemia/insulin resistance/prediabetes- a1c as high as 6 S:  Medication: None Lab Results  Component Value Date   HGBA1C 5.7  03/13/2024   HGBA1C 6.0 01/07/2023   HGBA1C 6.1 06/25/2022  A/P: too soon for a1c- hold off for now   #MUSCULOSKELETAL- works with Dr. Joane - on gabapentin  100 mg  just as needed -left hip arthritis- injections with DrRONITA Joane as needed - doing ok lately   Recommended follow up: Return in about 6 months (around 02/26/2025) for physical or sooner if needed.Schedule b4 you leave. Future Appointments  Date Time Provider Department Center  08/30/2024  8:45 AM Joesph Dire, OT [facility] None  08/30/2024  9:30 AM Moser, Marina , PT [facility] None  09/04/2024  8:45 AM Jannet Darryle SAUNDERS, PT [facility] None  09/04/2024  9:30 AM Joesph Dire, OT [facility] None  09/06/2024 10:15 AM Joesph Dire, OT [facility] None  09/06/2024 11:00 AM Moser, Marina , PT [facility] None  09/12/2024 11:00 AM Ebb Bruckner B, PT [facility] None  09/12/2024 11:45 AM Joesph Dire, OT [facility] None  09/19/2024  8:45 AM Joesph Dire, OT [facility] None  09/19/2024  9:30 AM Moser, Marina , PT [facility] None  09/26/2024 10:15 AM Joesph Dire, OT [facility] None  09/26/2024 11:00 AM Jannet Sluder R, PT [facility] None  09/28/2024  8:45 AM Jannet Sluder R, PT [facility] None  09/28/2024  9:30 AM Joesph Dire, OT [facility] None  10/04/2024  8:45 AM Jannet Sluder R, PT [facility] None  10/04/2024  9:30 AM Joesph Dire, OT [facility] None  10/10/2024  8:45 AM Moser, Marina , PT [facility] None  10/10/2024  9:30 AM Joesph Dire, OT [facility] None  10/12/2024  8:45 AM Joesph Dire, OT [facility] None  10/12/2024  9:30 AM Jannet Sluder R, PT [facility] None  10/16/2024  9:30 AM Moser, Marina , PT [facility] None  10/16/2024 10:15 AM Joesph Dire, OT [facility] None  10/18/2024  8:45 AM Jannet Sluder R, PT [facility] None  10/18/2024  9:30 AM Joesph Dire, OT [facility] None  10/23/2024  9:30 AM Moser, Marina , PT [facility] None  10/23/2024 10:15 AM Joesph Dire, OT [facility] None  10/25/2024   8:45 AM Jannet Sluder R, PT [facility] None  10/25/2024  9:30 AM Joesph Dire, OT [facility] None  10/30/2024  9:30 AM Moser, Marina , PT [facility] None  10/30/2024 10:15 AM Joesph Dire, OT [facility] None  11/01/2024  8:45 AM Jannet Sluder R, PT [facility] None  11/01/2024  9:30 AM Joesph Dire, OT [facility] None  11/06/2024  9:30 AM Moser, Marina , PT [facility] None  11/06/2024 10:15 AM Joesph Dire, OT [facility] None  11/08/2024  8:45 AM Jannet Sluder R, PT [facility] None  11/08/2024  9:30 AM Joesph Dire, OT [facility] None  11/13/2024  9:30 AM Moser, Marina , PT [facility] None  11/13/2024 10:15 AM Joesph Dire, OT [facility] None  11/15/2024  8:45 AM Jannet Sluder R, PT [facility] None  11/15/2024  9:30 AM Joesph Dire, OT [facility] None  11/20/2024  9:30 AM Moser, Marina , PT [facility] None  11/20/2024 10:15 AM Joesph Dire, OT [facility] None  11/22/2024  8:45 AM Joesph Dire, OT [facility] None  11/22/2024  9:30 AM Moser, Marina , PT [facility] None  11/27/2024  9:30 AM Moser, Marina , PT [facility] None  11/27/2024 10:15 AM Joesph Dire, OT [facility] None  11/29/2024  8:45 AM Jannet Sluder SAUNDERS, PT [facility] None  11/29/2024  9:30 AM Joesph Dire, OT [facility] None  05/24/2025  3:00 PM [facility] ANNUAL WELLNESS VISIT 1 [facility] Willo Milian    Lab/Order associations:   ICD-10-CM   1. Persistent atrial fibrillation (HCC)  I48.19     2. Essential hypertension, benign  I10     3. Hyperlipidemia, unspecified hyperlipidemia type  E78.5     4. Stage 3 chronic kidney disease, unspecified whether stage 3a or 3b CKD (HCC)  N18.30     5. Deficiency anemia  D53.9       No orders of the defined types were placed in this encounter.   Return precautions advised.  Garnette Lukes, MD

## 2024-08-28 NOTE — Patient Instructions (Addendum)
 Please stop by lab before you go If you have mychart- we will send your results within 3 business days of us  receiving them.  If you do not have mychart- we will call you about results within 5 business days of us  receiving them.  *please also note that you will see labs on mychart as soon as they post. I will later go in and write notes on them- will say notes from Dr. Katrinka   No changes today unless labs lead us  to make changes  Recommended follow up: Return in about 6 months (around 02/26/2025) for physical or sooner if needed.Schedule b4 you leave.

## 2024-08-29 ENCOUNTER — Ambulatory Visit: Payer: Self-pay | Admitting: Family Medicine

## 2024-08-29 LAB — VITAMIN B12: Vitamin B-12: >1500 pg/mL — ABNORMAL HIGH (ref 211–911)

## 2024-08-30 ENCOUNTER — Ambulatory Visit: Admitting: Physical Therapy

## 2024-08-30 ENCOUNTER — Ambulatory Visit: Admitting: Occupational Therapy

## 2024-08-30 DIAGNOSIS — R262 Difficulty in walking, not elsewhere classified: Secondary | ICD-10-CM

## 2024-08-30 DIAGNOSIS — M6281 Muscle weakness (generalized): Secondary | ICD-10-CM

## 2024-08-30 DIAGNOSIS — R2681 Unsteadiness on feet: Secondary | ICD-10-CM

## 2024-08-30 DIAGNOSIS — R278 Other lack of coordination: Secondary | ICD-10-CM

## 2024-08-30 NOTE — Therapy (Signed)
 +  OUTPATIENT PHYSICAL THERAPY NEURO TREATMENT   Patient Name: Maria Lowe MRN: [id number] DOB:07-Jun-1931, 89 y.o., female Today's Date: 08/30/2024   PCP: Garnette KIDD. Katrinka, MD REFERRING PROVIDER: Garnette KIDD. Katrinka, MD  END OF SESSION:  PT End of Session - 08/30/24 0941     Visit Number 6    Number of Visits 24    Date for Recertification  10/25/24    PT Start Time 0930    PT Stop Time 1010    PT Time Calculation (min) 40 min    Equipment Utilized During Treatment Gait belt    Activity Tolerance Patient tolerated treatment well    Behavior During Therapy WFL for tasks assessed/performed          Past Medical History:  Diagnosis Date   Anticoagulant long-term use    eliquis -- managed by cardiology   Arthritis    knees   CKD (chronic kidney disease), stage III (HCC)    CLL (chronic lymphocytic leukemia) (HCC) 11/27/2013   oncologist-- dr lonn;  initial dx 03/ 2015, no treatment, active survillance   Essential hypertension    followed by pcp   Full dentures    History of gastric ulcer    stomach ulcer when young   Lymphocytosis 2009   followed oncology   Permanent atrial fibrillation (HCC) 11/2020   cardiologist-- primary dr w. barbaraann and followed by atrial clinic;    dx 003/ 2022, atenolol  for rate control and on eliquis    Rectocele    Thrombocythemia 2009   related to CLL, followed by dr lonn   Urinary incontinence    Vaginal atrophy    Past Surgical History:  Procedure Laterality Date   APPENDECTOMY     1980s   CATARACT EXTRACTION W/ INTRAOCULAR LENS IMPLANT Bilateral 2020   RECTOCELE REPAIR N/A 08/25/2021   Procedure: POSTERIOR REPAIR (RECTOCELE);  Surgeon: Jertson, Jill Evelyn, MD;  Location: [facility];  Service: Gynecology;  Laterality: N/A;   TONSILLECTOMY AND ADENOIDECTOMY     age 21   Patient Active Problem List   Diagnosis Date Noted   Severe mitral regurgitation 05/23/2024   Deficiency anemia 04/21/2024    Dehydration 04/21/2024   Acute left-sided low back pain with left-sided sciatica 06/25/2022   H/O rectocele repair 08/25/2021   Secondary hypercoagulable state 12/10/2020   Persistent atrial fibrillation (HCC) 11/29/2020   Hyperlipidemia, unspecified 04/07/2018   CKD (chronic kidney disease), stage III (HCC) 02/09/2018   Rash 02/09/2018   Seborrheic dermatitis 01/08/2017   Former smoker 09/30/2015   Rectocele 03/29/2015   Thrombocytopenia 11/26/2014   CLL (chronic lymphocytic leukemia) (HCC) 11/27/2013   Hyperglycemia 05/08/2013   Essential hypertension, benign 11/07/2012   Obesity, unspecified 11/07/2012    ONSET DATE: May 2025  REFERRING DIAG: Severe muscle deconditioning  THERAPY DIAG:  Muscle weakness (generalized)  Unsteadiness on feet  Difficulty in walking, not elsewhere classified  Other lack of coordination  Rationale for Evaluation and Treatment: Rehabilitation  SUBJECTIVE:  SUBJECTIVE STATEMENT:  Pt states she is doing well, she had a doctors appt recently and doctor stated she is healthy. Pt says her hand is tired from OT.    Pt accompanied by: family member (daughter)  PERTINENT HISTORY:  Pt was referred to outpatient PT for muscle deconditioning. Pt experienced a cat bite that required hospitalization while she was in [place]. Daughter states she was quite ill and was on bed rest for 3 weeks, which she states contributed to her decline in function. Prior to hospitalization patient was ambulating with a cane and independent with all activities. Currently she ambulates with a rollator, but would like to work toward ambulating with a cane. Pt enjoys reading and cooking. Her goals are to ambulated with LRAD, navigate stairs with confidence, and increase tolerance for cooking.    PAIN:  Are you having pain? Occasional arthritic pain, primarily right knee  PRECAUTIONS: None  RED FLAGS: None   WEIGHT BEARING RESTRICTIONS: No  FALLS: Has patient fallen in last 6 months? Yes. Number of falls 3   LIVING ENVIRONMENT: Lives with: lives with their daughter Lives in: House/apartment Stairs: Yes; two steps at daughters house/ currently ramped Has following equipment at home: Counselling psychologist, Environmental Consultant - 4 wheeled, shower chair, Grab bars, and hospital bed  PLOF: Independent with basic ADLs   PATIENT GOALS: Pt would like to work on getting in and out of chairs, increasing tolerance for cooking, and using LRAD.    OBJECTIVE:  Note: Objective measures were completed at Evaluation unless otherwise noted.   COGNITION: Overall cognitive status: Within functional limits for tasks assessed    LOWER EXTREMITY ROM:     Active  Right Eval Left Eval  Hip flexion    Hip extension    Hip abduction    Hip adduction    Hip internal rotation    Hip external rotation    Knee flexion    Knee extension    Ankle dorsiflexion    Ankle plantarflexion    Ankle inversion    Ankle eversion     (Blank rows = not tested)  LOWER EXTREMITY MMT:    MMT Right Eval Left Eval  Hip flexion 4+ 4+  Hip extension    Hip abduction 3+ 3+  Hip adduction 3+ 3+  Hip internal rotation    Hip external rotation    Knee flexion 4+ 4+  Knee extension 4+ 4+  Ankle dorsiflexion 4 4  Ankle plantarflexion 3+ 3+  Ankle inversion    Ankle eversion    (Blank rows = not tested)  BED MOBILITY:  Pt reports no difficulty with bed mobility, but utilizes a hospital bed  RAMP:  Pt reports unsteadiness with ramps  STAIRS: To be assessed at next visit GAIT: Findings: Distance walked: 623ft, Assistive device utilized:Walker - 4 wheeled  FUNCTIONAL TESTS:  5 times sit to stand: 16.95 sec completed with UE support Timed up and go (TUG): 23.59 sec completed w/ 5TT 10 meter walk  test: 16.34 sec; 0.61 m/s completed w/ 5TT 6 minute walk test: completed 5:07 min, 612 ft completed w/ 5TT Item Test date: 08/07/24  Sitting to standing 2. able to stand using hands after several tries  2. Standing unsupported 4. able to stand safely for 2 minutes  3. Sitting with back unsupported, feet supported 4. able to sit safely and securely for 2 minutes  4. Standing to sitting 4. sits safely with minimal use of hands  5. Pivot transfer  3. able to transfer safely with definite need of hands  6. Standing unsupported with eyes closed 3. able to stand 10 seconds with supervision  7. Standing unsupported with feet together 1. needs help to attain position but able to stand 15 seconds feet together  8. Reaching forward with outstretched arms while standing 2. can reach forward 5 cm (2 inches)  9. Pick up object from the floor from standing 1. unable to pick up and needs supervision while trying  10. Turning to look behind over left and right shoulders while standing 1. needs supervision when turning  11. Turn 360 degrees 1. needs close supervision or verbal cuing  12. Place alternate foot on step or stool while standing unsupported 0. needs assistance to keep from falling/unable to try  13. Standing unsupported one foot in front 0. loses balance while stepping or standing  14. Standing on one leg 0. unable to try of needs assist to prevent fall    Total Score 26/56     PATIENT SURVEYS:  ABC scale: 37.5%                                                                                                                              TREATMENT DATE: 08/30/24   TherAct: To improve functional movements patterns for everyday tasks  Gait x 131ft with SPC x 3 -1 round of 150 ft performed at a time. Repeated after SLS and sit to stands  Sit to stands 2 x 6  -VC to reach forward before coming to stand  Step taps 2 x 8 ea LE to stairs with single UE   -Right knee began to cause discomfort  toward end of reps  Alternating lateral side step at bar with intermittent UE support 1 x 10  NMR: To facilitate reeducation of movement, balance, posture, coordination, and/or proprioception/kinesthetic sense.   SLS with foot elevated on stair step 3 x 30 sec  -Intermittent UE support  Standing airex NBOS 3 x 30 sec -Progressed last 2 sets to eyes closed due to pt stating 1st set was easy  Unless otherwise stated, CGA was provided and gait belt donned in order to ensure pt safety   Pt required occasional rest breaks due fatigue, PT was attentive to when pt appeared to be tired or winded in order to prevent excessive fatigue.   PATIENT EDUCATION: Education details: POC Person educated: Patient Education method: Explanation Education comprehension: verbalized understanding   HOME EXERCISE PROGRAM: Establish visit 2     GOALS: Goals reviewed with patient? Yes  SHORT TERM GOALS: Target date: 09/13/2024  Patient will be independent with HEP to improve strength and mobility for better functional independence with ADL's Baseline: Establish at second visit Goal status: INITIAL   LONG TERM GOALS: Target date: 10/25/2024  Patient will improve 5xSTS to 12 seconds or less to demonstrate improved strength and balance. Baseline: 16.95 sec with UE support Goal status: INITIAL  2.  Patient will decrease TUG score by 8 seconds or more with LRAD to indicate decreased fall risk and improved transfer ability. Baseline:  23.59 sec completed with 4WW Goal status: INITIAL  3.  Patient will improve by >1.0 m/s with LRAD for improved community ambulation and indicate a decreased fall risk. Baseline: 0.61 m/s completed with 4WW Goal status: INITIAL  4.  Patient will improve to >800 ft with LRAD for progression toward community ambulator.  Baseline: 5:07 min, 612 ft. Completed with 4WW Goal status: INITIAL  5.  Patient will improve BERG by 6 points or more to demonstrate  decreased fall risk with functional activities.  Baseline: To be assessed at visit 2 Goal status: INITIAL  6.  Patient will be able to navigate 4 steps with step to pattern using UE support and supervision in order to enter daughter's home.  Baseline: to be assessed at visit 2 Goal status: INITIAL   ASSESSMENT:  CLINICAL IMPRESSION:  Pt arrived with good motivation for completion of PT activities. Today's session included gait with SPC, sit to stands, and balance activities. Pt continues to demonstrate great two point pattern, and required less standing rest during each lap around clinic. Pt continues to be challenged with sit to stands requiring intermittent UE push off for standing. VC to reach forward helped facilitate improved standing, but pt reverts back to decreased anterior weight shift after 2 reps. NBOS on airex was performed with ease, so progressed to eyes closed. Pt was most challenged with this and required frequent UE support and opening eyes due to increased sway. Pt will continue to benefit from skilled therapy to address remaining deficits in order to improve overall QoL and return to PLOF.    OBJECTIVE IMPAIRMENTS: Abnormal gait, decreased activity tolerance, decreased balance, decreased endurance, decreased knowledge of use of DME, and decreased strength.   ACTIVITY LIMITATIONS: carrying, lifting, squatting, stairs, transfers, and reach over head  PARTICIPATION LIMITATIONS: meal prep, cleaning, laundry, and community activity  PERSONAL FACTORS: Age and 3+ comorbidities: CKD, CLL, HTN are also affecting patient's functional outcome.   REHAB POTENTIAL: Good  CLINICAL DECISION MAKING: Evolving/moderate complexity  EVALUATION COMPLEXITY: Moderate  PLAN:  PT FREQUENCY: 2x/week  PT DURATION: 12 weeks  PLANNED INTERVENTIONS: 97110-Therapeutic exercises, 97530- Therapeutic activity, 97112- Neuromuscular re-education, 97535- Self Care, [id number]- Manual therapy, [phone]- Gait  training, Patient/Family education, Balance training, Stair training, Vestibular training, and DME instructions  PLAN FOR NEXT SESSION:  [facility] training for community tasks  Balance activities- SLS  Leonor Rode, SPT  08/30/24, 9:41 AM  This entire session was performed under direct supervision and direction of a licensed therapist/therapist assistant . I have personally read, edited and approve of the note as written.    This licensed clinician was present and actively directing care throughout the session at all times.  Lonni KATHEE Gainer PT ,DPT Physical Therapist- [place]  [facility]

## 2024-08-30 NOTE — Therapy (Signed)
 Occupational Therapy Treatment Note   Patient Name: Maria Lowe MRN: [id number] DOB:December 27, 1930, 89 y.o., female Today's Date: 08/30/2024   REFERRING PROVIDER: Katrinka Senior, MD  END OF SESSION:   OT End of Session - 08/30/24 1003     Visit Number 25    Number of Visits 48    Date for Recertification  10/30/24    OT Start Time 0845    OT Stop Time 0930    OT Time Calculation (min) 45 min    Activity Tolerance Patient tolerated treatment well    Behavior During Therapy WFL for tasks assessed/performed           Past Medical History:  Diagnosis Date   Anticoagulant long-term use    eliquis -- managed by cardiology   Arthritis    knees   CKD (chronic kidney disease), stage III (HCC)    CLL (chronic lymphocytic leukemia) (HCC) 11/27/2013   oncologist-- dr lonn;  initial dx 03/ 2015, no treatment, active survillance   Essential hypertension    followed by pcp   Full dentures    History of gastric ulcer    stomach ulcer when young   Lymphocytosis 2009   followed oncology   Permanent atrial fibrillation (HCC) 11/2020   cardiologist-- primary dr w. barbaraann and followed by atrial clinic;    dx 003/ 2022, atenolol  for rate control and on eliquis    Rectocele    Thrombocythemia 2009   related to CLL, followed by dr lonn   Urinary incontinence    Vaginal atrophy    Past Surgical History:  Procedure Laterality Date   APPENDECTOMY     1980s   CATARACT EXTRACTION W/ INTRAOCULAR LENS IMPLANT Bilateral 2020   RECTOCELE REPAIR N/A 08/25/2021   Procedure: POSTERIOR REPAIR (RECTOCELE);  Surgeon: Jertson, Jill Evelyn, MD;  Location: [facility];  Service: Gynecology;  Laterality: N/A;   TONSILLECTOMY AND ADENOIDECTOMY     age 52   Patient Active Problem List   Diagnosis Date Noted   Severe mitral regurgitation 05/23/2024   Deficiency anemia 04/21/2024   Dehydration 04/21/2024   Acute left-sided low back pain with left-sided sciatica 06/25/2022    H/O rectocele repair 08/25/2021   Secondary hypercoagulable state 12/10/2020   Persistent atrial fibrillation (HCC) 11/29/2020   Hyperlipidemia, unspecified 04/07/2018   CKD (chronic kidney disease), stage III (HCC) 02/09/2018   Rash 02/09/2018   Seborrheic dermatitis 01/08/2017   Former smoker 09/30/2015   Rectocele 03/29/2015   Thrombocytopenia 11/26/2014   CLL (chronic lymphocytic leukemia) (HCC) 11/27/2013   Hyperglycemia 05/08/2013   Essential hypertension, benign 11/07/2012   Obesity, unspecified 11/07/2012    ONSET DATE: 01/2024  REFERRING DIAG: Right hand weakness  THERAPY DIAG:  Muscle weakness (generalized)  Rationale for Evaluation and Treatment: Rehabilitation  SUBJECTIVE:   SUBJECTIVE STATEMENT:  Pt. reports having had an MD appointment on Monday this week. Accompanied by self; with daughter in the reception area  PERTINENT HISTORY:  Pt. was referred for outpatient OT services for right hand pain. Pt. had received a cat bite approximately one week prior to her going on a trip to [place]. She was hospitalized in [place] for one month due to developing a Cat Bite Pasturella Infection. While in the hospital, Pt. reports that she was placed in an arm cast. Pt. reports that while she was in the hospital she was bedbound, was not allowed to get out of bed for the entire month, and became weak, and deconditioned. Pt. Reports that she  does not know why she was placed in a cast. [place] the [place] Pt. started home health OT and PT services, however OT services were limited due to staffing constraints. Pt. was then referred to outpatient OT services. PMHx includes: Severe mitral valve regurgitation, pericadrial, and pleural effusions, A-Fib, Chronic Lymphocytic Anemia, multifactorial anemia and CKD stage III, Diverticulosis, constipation, HTN, Hyperlipidemia, Prediabetes.  PRECAUTIONS: None  WEIGHT BEARING RESTRICTIONS: No  PAIN:  Are you having pain?  No pain  FALLS:  Has patient fallen in last 6 months? 1 fall in the last 6 months  LIVING ENVIRONMENT: Lives with: Resides with her daughter Lives in: House- one level Stairs: ramped entrance Has following equipment at home: Counselling psychologist, Environmental Consultant - 4 wheeled, shower chair, bed side commode, and Grab bars, hospital bed, elevated toilet  PLOF: Independent, living alone, managing a household-per report  PATIENT GOALS:  To be able to cut her meat  OBJECTIVE:  Note: Objective measures were completed at Evaluation unless otherwise noted.  HAND DOMINANCE: Right  ADLs:  Transfers/ambulation related to ADLs: Supervision with a rollator walker Eating: Pt. has difficulty with using utensils with the right hand, often bringing her head down to meet hand/utensil when eating. Pt. Has  weakness with bringing the cup to her mouth. Difficulty with cutting food. Grooming: Difficulty sustaining the BUE's in up long enough to perform hair care. Independent with oral care,  UB Dressing: Independent, requires increased time to complete. Pt. Does endorse having difficulty reaching up to put a shirt on over her head. LB Dressing:Reports being able to perform LE dressing, Uses slide-on footwear.  Toileting:Pt. Reports independently being able to perform toileting care needs using her left hand. (Of note, Pt. Reports that she has always used her left hand to perform toilet hygiene care.) Bathing: Independent, daughter provides supervision from outside the shower curtain. Daughter assists with drying her back due to limited reach Tub Shower transfers: Daughter provides supervision tub/shower t/f's   IADLs: Light housekeeping: Pt. reports that she has resumed some light housekeeping tasks, however this has been limited. Pt. Reports that she does not vacuum, or sweep due to rollator use. Meal Prep: Pt. Has resumed weekly cooking on Sundays. Reports having difficulty cutting onions, as well as difficulty holding and using a  beater. Pt. Reports that she uses a modified technique to hold kitchen/cooking utensils. Community mobility: Pt. Relies on family  and friends for transportation Medication management: Pt. Has difficulty manipulating medication efficiently with the right hand Handwriting: 50% legible for name only Leisure/Hobbies: Loves to cook; enjoys reading , and working crossword puzzles.  MOBILITY STATUS:  Supervision using a rollator  FUNCTIONAL OUTCOME MEASURES: 05/30/24: MAM-20: 11/20 items were completed. The following items Pt. indicated as a 2 -very hard to do: wringing out a towel, opening a wide mouth bottle-previously opened, cutting meat on a plate, tying shoe laces, zipping a jacket, and squeezing toothpaste on a toothbrush. The following items Pt. indicated as a 1-cannot do: opening a medication bottle with a childproof cap, cutting nails with a clipper, buttoning clothing, picking up a 1/2 full water pitcher, and writing 3-4 lines legibly.  UPPER EXTREMITY ROM:     ROM Right eval Right 06/27/24 Right 08/07/24 Left eval Left 06/27/24 Left 08/07/24  Shoulder flexion 130(150) Scaption 134(150) 144(150)  100 scaption 100 scaption (120) flexion  Shoulder abduction 82(120) 109(120) 118(120) 72(102) 892(887) 107(118)  Shoulder adduction        Shoulder extension  Shoulder internal rotation        Shoulder external rotation        Elbow flexion [facility] WFL 148 WFL WFL  Elbow extension [facility] WFL  -60(-28) -35(-18) -26(-13)  Wrist flexion 26(60) 30(60) 36(60) WFL WFL 60(70)  Wrist extension 44(62) 46(62) 52(62) WFL WFL 70  Wrist ulnar deviation        Wrist radial deviation        Wrist pronation        Wrist supination        (Blank rows = not tested)  Eval: Digit flexion to the [facility]:    Right: 2nd: 5cm(2cm) 3rd: 0cm  4th: 0cm 5th: 5cm(3cm)     Left: 2nd: 1cm(1cm) 3rd: 2cm(0cm) 4th: 4cm(0cm) 5th: 5cm(3cm)    06/27/24:  Digit flexion to the [facility]:    Right: 2nd: 4.5cm(2cm) 3rd:  2.5cm(0cm)  4th: 2cm (0cm) 5th: 5cm(2cm)     Left: 2nd: 1cm(1cm) 3rd: 1.54m(1cm) 4th: 1.5cm(1cm) 5th: 5cm(1.5cm)   08/07/24:  Digit flexion to the [facility]:    Right: 2nd: 3.5cm(1.5cm) 3rd: 0cm(0cm)  4th: 1.5cm (0cm) 5th: 5cm(1.5cm)     Left: 2nd: 0cm(0cm) 3rd: o)m(0cm) 4th: 0cm(0cm) 5th: 4.5cm(1.5cm)  UPPER EXTREMITY MMT:     MMT Right eval Right  06/27/24 Right 08/07/24 Left eval Left 06/27/24 Left 08/07/24  Shoulder flexion  3+/5 4-/5  3-/5 3-/5  Shoulder abduction 3-/5 3+/5 3+/5 3-/5 3+/5 3+/5  Shoulder adduction        Shoulder extension        Shoulder internal rotation        Shoulder external rotation        Middle trapezius        Lower trapezius        Elbow flexion 4/5 4/5 4/5 4/5 4/5 4/5  Elbow extension 4/5 4/5 4/5 2+/5 3-/5 3-/5  Wrist flexion 3-/5 3-/5 3-/5 4-/5 4-/5 4-/5  Wrist extension 3-/5 3/5 3+/5 4-/5 4-/5 4-/5  Wrist ulnar deviation        Wrist radial deviation        Wrist pronation        Wrist supination        (Blank rows = not tested)  HAND FUNCTION: 05/30/24: Grip strength: Right: 8 lbs; Left: 12 lbs, Lateral pinch: Right: 4 lbs, Left: 3 lbs, and 3 point pinch: Right: 3 lbs, Left: 3 lbs  06/27/24: Grip strength: Right: 11 lbs; Left: 12 lbs, Lateral pinch: Right: 4 lbs, Left: 3 lbs, and 3 point pinch: Right: 3 lbs, Left: 3 lbs  08/07/24: Grip strength: Right: 15 lbs; Left: 18 lbs, Lateral pinch: Right: 10 lbs, Left: 9 lbs, and 3 point pinch: Right: 5 lbs, Left: 3 lbs   COORDINATION:  COORDINATION: Eval:   9 Hole Peg test: Right: 48 sec; Left: 49 sec  06/27/24:  9 Hole Peg test: Right: 33 sec; Left: 49 sec  08/07/24:   9 Hole Peg test: Right: 31 sec; Left: 44 sec.  SENSATION: TBD   EDEMA: N/A    COGNITION: Overall cognitive status: Within functional limits for tasks assessed  VISION: Subjective report: No change from baseline  PERCEPTION: [facility]  TREATMENT DATE: 08/30/24  Therapeutic Ex.:    -Performed AROM, followed by PROM to the end range for bilateral shoulder flexion, abduction, external rotation, horizontal abduction. Emphasis was placed on slow prolonged gentle stretching 2/2 stiffness and soreness. -Pt. tolerated bilateral AROM/AAROM/PROM for right bilateral 5th digit MP, PIP, and DIP flexion to prepare for formulating a composite fist.  Therapeutic Activities:   -Facilitated Lateral, and 3pt. Pinch strengthening using yellow, red, and green level resistive clips  -Facilitated  translatory movements moving clips from the lateral pinch position to the 3pt. pinch position in preparation for securely placing them on the dowel to promote hand function skills. -Facilitated [facility] skills grasping 1/2 flat marbles, and worked on moving them through her hand from the palm to the tip of the 2nd digit, and thumb in preparation for discarding them onto the tabletop surface.  -Facilitated use of the left hand for storing them in the palm of her hand.  -Facilitated controlled dropping them one at a time from the ulnar aspect of her palm.   PATIENT EDUCATION: Education details: digit ROM, UE strengthening Person educated: Patient Education method: Explanation, Demonstration, Tactile cues, and Verbal cues Education comprehension: verbalized understanding, returned demonstration, verbal cues required, tactile cues required, and needs further education  HOME EXERCISE PROGRAM:    -Bilateral elbow flexion, and extension with yellow theraband.  -Yellow theraputty strengthening  following a HEP through [employer]. -contrasting heat 3 min., followed by 1 min. Cold  for 1 min. Ending with heat for 3 min. To the right hand. (Pt. Prefers just heat at home), moist heat to the left hand. -Massage to the right hand  -Right hand digit extension with the hand flat at the tabletop surface -Blue resistive foam block for  Bilateral gross gripping, lateral pinch, and 3pt. Pinch strength -UE shoulder stabilization in supine with the shoulder flexed to 90 degrees with the elbow extended-formulating the alphabet    GOALS: Goals reviewed with patient? Yes  SHORT TERM GOALS: Target date: 07/07/2024     Pt. Will be independent with HEPs for UE strength, and coordination Baseline: 06/27/24: Independent; continue Eval: No current HEP Goal status: Ongoing   LONG TERM GOALS: Target date: 08/18/2024   To reduce pain by 3 grades on the pain scale in preparation for functional hand use during ADLs/IADLs Baseline: 06/27/24: 0/10 pain in the right hand. Pt. reports discomfort intermittently with left 5th digit flexion. Eval:  5/10 pain in the right hand Goal status: Progressed; Achieved  2.  Pt. Will formulate a full composite fist in preparation for securely holding kitchen/cooking utensils. Baseline:08/07/24: Digit flexion to the [facility]: Right: 2nd: 3.5cm(1.5cm) 3rd: 0cm(0cm)  4th: 1.5cm (0cm) 5th: 5cm(1.5cm) Left: 2nd: 0cm(0cm) 3rd: o)m(0cm) 4th: 0cm(0cm) 5th: 4.5cm(1.5cm) 06/27/24: Digit flexion to the [facility]: Right: 2nd: 4.5cm(2cm) 3rd: 2.5cm(0cm)  4th: 2cm (0cm) 5th: 5cm(2cm) Left: 2nd: 1cm(1cm) 3rd: 1.12m(1cm) 4th: 1.5cm(1cm) 5th: 5cm(1.5cm) Pt. Had difficulty with holding and using utensils during self-feeding, Pt. Is improving with larger cooking utensilsEval: Digit flexion to the [facility]:M[facility]u: 2nd: 5cm(2cm) 3rd: 0cm  4th: 0cm 5th: 5cm(3cm) Left: 2nd: 1cm(1cm) 3rd: 2cm(0cm) 4th: 4cm(0cm) 5th: 5cm(3cm) Pt, has difficulty securely holding cooking utensils in her right hand Goal status: Progressing, Ongoing  3.  Pt. Will efficiently perform hand to mouth patterns during self-feeding with modified independence using her right hand. Baseline: 08/07/24: Pt. continues to have difficulty bringing her spoon all the way to her mouth, continues to need to bring her mouth down to her spoon.06/27/24: Pt. continues to  have difficulty  bringing her spoon all the way to her mouth, often needing to bring her mouth down to her spoon. Eval: Pt. Has difficulty completing hand to mouth patterns, often bringing her mouth down to meet the spoon 2/2 weakness in the RUE/hand Goal status: Progressing, Ongoing  4.  Pt. Will cut meat on her plate efficiently with modified independence Baseline: 08/07/24: Pt. reports that she is now able to cut meat. 06/27/24: Pt. Is improving with cutting meat, over continues to have difficulty cutting meat. Eval: Pt. Has difficulty cutting meat Goal status: Achieved  5.  Pt. Will increase bilateral shoulder abduction by 10 degrees be able to efficiently perform hair care Baseline: 08/07/24:  Shoulder abduction: Right: 118(120), Left: 107(112) Pt. is now able to brush reach up further to the back of her head.06/27/24: Shoulder abduction: Right: 109(120), Left: 107(112) Pt. is able to reach up further to her head, however has difficulty sustaining BUE in elevation while performing hair care. Eval: shoulder abduction: Right: 82(120), Left: 72(102) Goal status: Progressing  6.  Pt. Will write one sentence efficiently  with 75% in preparation for being able to fill in crossword puzzles. Baseline: 08/07/24: Continue 06/27/24: Name only with 90% legibility; one sentence completed with 50% legibility Eval: writing legibility: 75% for name only. Goal status: INITIAL  7. Pt. Will improve left elbow extension to be able to efficiently reach out for items on the table.    Baseline: Elbow extension: -26(-13)    Goal status: New    8. Pt. Will improve BUE strength to assist with ADLs, and IADL tasks  Baseline: 08/07/24: UE strength: Right Shoulder flexion: 4-/5, abduction: 3+/5, elbow flexion: 4/5, extension: 4/5, wrist flexion: 3-/5, extension: 3+/5; Left Shoulder flexion: 3-/5, abduction: 3+/5, elbow flexion: 4/5, extension: 3-/5, wrist flexion: 4-/5, extension: 4-/5  Goal status: New  9. Pt. will improve bilateral  grip strength by 5# to be able to hold items more securely for ADLs/IADLs  Baseline: 08/07/24: Grip strength: Right: 15# Left: 18#  Goal status: New  10.  Pt. Will improve bilateral [facility] skills in order to be able to manipulate small objects  Baseline: 08/07/24: 9 Hole Peg test: Right: 31 sec; Left: 44 sec.   Goal status: New   ASSESSMENT:  CLINICAL IMPRESSION:  Pt. continues to present with limited bilateral 5th digit flexion to the [facility].  Pt. is able to passively achieve flexion to the [facility], however has discomfort with it. Pt. Required cues for hand placement, and movement pattern with the resistive clips. Pt. Was able to store 3 flat marbles with the right hand, and 7 with the left hand.  Pt. continues to benefit from OT services to work on improving BUE functioning in order to increase engagement of the UEs during ADLs, and IADL tasks.    PERFORMANCE DEFICITS: in functional skills including ADLs, IADLs, coordination, dexterity, ROM, strength, pain, Fine motor control, Gross motor control, and UE functional use, , and psychosocial skills including coping strategies, environmental adaptation, habits, interpersonal interactions, and routines and behaviors.   IMPAIRMENTS: are limiting patient from ADLs, IADLs, and leisure.   CO-MORBIDITIES: may have co-morbidities  that affects occupational performance. Patient will benefit from skilled OT to address above impairments and improve overall function.  MODIFICATION OR ASSISTANCE TO COMPLETE EVALUATION: Min-Moderate modification of tasks or assist with assess necessary to complete an evaluation.  OT OCCUPATIONAL PROFILE AND HISTORY: Detailed assessment: Review of records and additional review of physical, cognitive, psychosocial history related to current  functional performance.  CLINICAL DECISION MAKING: Moderate - several treatment options, min-mod task modification necessary  REHAB POTENTIAL: Good  EVALUATION COMPLEXITY:  Moderate   PLAN:  OT FREQUENCY: 2x/week  OT DURATION: 12 weeks  PLANNED INTERVENTIONS: 97168 OT Re-evaluation, 97535 self care/ADL training, 02889 therapeutic exercise, 97530 therapeutic activity, 97112 neuromuscular re-education, 97140 manual therapy, 97018 paraffin, 02989 moist heat, 97010 cryotherapy, 97034 contrast bath, 97760 Orthotic Initial, 97763 Orthotic/Prosthetic subsequent, passive range of motion, balance training, functional mobility training, energy conservation, coping strategies training, patient/family education, and DME and/or AE instructions  RECOMMENDED OTHER SERVICES: PT for balance  CONSULTED AND AGREED WITH PLAN OF CARE: Patient  PLAN FOR NEXT SESSION:  Assess sensation, and hand function skills at the next visit, as well administer the MAM-20 outcome measure; treatment  Richardson Otter, MS, OTR/L  08/30/2024, 10:04 AM

## 2024-09-04 ENCOUNTER — Ambulatory Visit

## 2024-09-04 ENCOUNTER — Ambulatory Visit: Admitting: Occupational Therapy

## 2024-09-06 ENCOUNTER — Ambulatory Visit

## 2024-09-06 ENCOUNTER — Ambulatory Visit: Admitting: Occupational Therapy

## 2024-09-12 ENCOUNTER — Ambulatory Visit: Admitting: Physical Therapy

## 2024-09-12 ENCOUNTER — Ambulatory Visit: Admitting: Occupational Therapy

## 2024-09-12 DIAGNOSIS — M6281 Muscle weakness (generalized): Secondary | ICD-10-CM

## 2024-09-12 DIAGNOSIS — R262 Difficulty in walking, not elsewhere classified: Secondary | ICD-10-CM

## 2024-09-12 DIAGNOSIS — R2681 Unsteadiness on feet: Secondary | ICD-10-CM

## 2024-09-12 NOTE — Therapy (Signed)
 " Occupational Therapy Treatment Note   Patient Name: Maria Lowe MRN: [id number] DOB:July 22, 1931, 89 y.o., female Today's Date: 09/12/2024   REFERRING PROVIDER: Katrinka Senior, MD  END OF SESSION:   OT End of Session - 09/12/24 1650     Visit Number 26    Number of Visits 48    Date for Recertification  10/30/24    OT Start Time 1145    OT Stop Time 1230    OT Time Calculation (min) 45 min    Activity Tolerance Patient tolerated treatment well    Behavior During Therapy WFL for tasks assessed/performed           Past Medical History:  Diagnosis Date   Anticoagulant long-term use    eliquis -- managed by cardiology   Arthritis    knees   CKD (chronic kidney disease), stage III (HCC)    CLL (chronic lymphocytic leukemia) (HCC) 11/27/2013   oncologist-- dr lonn;  initial dx 03/ 2015, no treatment, active survillance   Essential hypertension    followed by pcp   Full dentures    History of gastric ulcer    stomach ulcer when young   Lymphocytosis 2009   followed oncology   Permanent atrial fibrillation (HCC) 11/2020   cardiologist-- primary dr w. barbaraann and followed by atrial clinic;    dx 003/ 2022, atenolol  for rate control and on eliquis    Rectocele    Thrombocythemia 2009   related to CLL, followed by dr lonn   Urinary incontinence    Vaginal atrophy    Past Surgical History:  Procedure Laterality Date   APPENDECTOMY     1980s   CATARACT EXTRACTION W/ INTRAOCULAR LENS IMPLANT Bilateral 2020   RECTOCELE REPAIR N/A 08/25/2021   Procedure: POSTERIOR REPAIR (RECTOCELE);  Surgeon: Jertson, Jill Evelyn, MD;  Location: [facility];  Service: Gynecology;  Laterality: N/A;   TONSILLECTOMY AND ADENOIDECTOMY     age 38   Patient Active Problem List   Diagnosis Date Noted   Severe mitral regurgitation 05/23/2024   Deficiency anemia 04/21/2024   Dehydration 04/21/2024   Acute left-sided low back pain with left-sided sciatica 06/25/2022    H/O rectocele repair 08/25/2021   Secondary hypercoagulable state 12/10/2020   Persistent atrial fibrillation (HCC) 11/29/2020   Hyperlipidemia, unspecified 04/07/2018   CKD (chronic kidney disease), stage III (HCC) 02/09/2018   Rash 02/09/2018   Seborrheic dermatitis 01/08/2017   Former smoker 09/30/2015   Rectocele 03/29/2015   Thrombocytopenia 11/26/2014   CLL (chronic lymphocytic leukemia) (HCC) 11/27/2013   Hyperglycemia 05/08/2013   Essential hypertension, benign 11/07/2012   Obesity, unspecified 11/07/2012    ONSET DATE: 01/2024  REFERRING DIAG: Right hand weakness  THERAPY DIAG:  Muscle weakness (generalized)  Rationale for Evaluation and Treatment: Rehabilitation  SUBJECTIVE:   SUBJECTIVE STATEMENT:  Pt. reports having been sick with a cold. Accompanied by self; with daughter in the reception area  PERTINENT HISTORY:  Pt. was referred for outpatient OT services for right hand pain. Pt. had received a cat bite approximately one week prior to her going on a trip to [place]. She was hospitalized in [place] for one month due to developing a Cat Bite Pasturella Infection. While in the hospital, Pt. reports that she was placed in an arm cast. Pt. reports that while she was in the hospital she was bedbound, was not allowed to get out of bed for the entire month, and became weak, and deconditioned. Pt. Reports that she does not  know why she was placed in a cast. [place] the [place] Pt. started home health OT and PT services, however OT services were limited due to staffing constraints. Pt. was then referred to outpatient OT services. PMHx includes: Severe mitral valve regurgitation, pericadrial, and pleural effusions, A-Fib, Chronic Lymphocytic Anemia, multifactorial anemia and CKD stage III, Diverticulosis, constipation, HTN, Hyperlipidemia, Prediabetes.  PRECAUTIONS: None  WEIGHT BEARING RESTRICTIONS: No  PAIN:  Are you having pain?  No pain  FALLS: Has patient fallen  in last 6 months? 1 fall in the last 6 months  LIVING ENVIRONMENT: Lives with: Resides with her daughter Lives in: House- one level Stairs: ramped entrance Has following equipment at home: Counselling psychologist, Environmental Consultant - 4 wheeled, shower chair, bed side commode, and Grab bars, hospital bed, elevated toilet  PLOF: Independent, living alone, managing a household-per report  PATIENT GOALS:  To be able to cut her meat  OBJECTIVE:  Note: Objective measures were completed at Evaluation unless otherwise noted.  HAND DOMINANCE: Right  ADLs:  Transfers/ambulation related to ADLs: Supervision with a rollator walker Eating: Pt. has difficulty with using utensils with the right hand, often bringing her head down to meet hand/utensil when eating. Pt. Has  weakness with bringing the cup to her mouth. Difficulty with cutting food. Grooming: Difficulty sustaining the BUE's in up long enough to perform hair care. Independent with oral care,  UB Dressing: Independent, requires increased time to complete. Pt. Does endorse having difficulty reaching up to put a shirt on over her head. LB Dressing:Reports being able to perform LE dressing, Uses slide-on footwear.  Toileting:Pt. Reports independently being able to perform toileting care needs using her left hand. (Of note, Pt. Reports that she has always used her left hand to perform toilet hygiene care.) Bathing: Independent, daughter provides supervision from outside the shower curtain. Daughter assists with drying her back due to limited reach Tub Shower transfers: Daughter provides supervision tub/shower t/f's   IADLs: Light housekeeping: Pt. reports that she has resumed some light housekeeping tasks, however this has been limited. Pt. Reports that she does not vacuum, or sweep due to rollator use. Meal Prep: Pt. Has resumed weekly cooking on Sundays. Reports having difficulty cutting onions, as well as difficulty holding and using a beater. Pt. Reports  that she uses a modified technique to hold kitchen/cooking utensils. Community mobility: Pt. Relies on family  and friends for transportation Medication management: Pt. Has difficulty manipulating medication efficiently with the right hand Handwriting: 50% legible for name only Leisure/Hobbies: Loves to cook; enjoys reading , and working crossword puzzles.  MOBILITY STATUS:  Supervision using a rollator  FUNCTIONAL OUTCOME MEASURES: 05/30/24: MAM-20: 11/20 items were completed. The following items Pt. indicated as a 2 -very hard to do: wringing out a towel, opening a wide mouth bottle-previously opened, cutting meat on a plate, tying shoe laces, zipping a jacket, and squeezing toothpaste on a toothbrush. The following items Pt. indicated as a 1-cannot do: opening a medication bottle with a childproof cap, cutting nails with a clipper, buttoning clothing, picking up a 1/2 full water pitcher, and writing 3-4 lines legibly.  UPPER EXTREMITY ROM:     ROM Right eval Right 06/27/24 Right 08/07/24 Left eval Left 06/27/24 Left 08/07/24  Shoulder flexion 130(150) Scaption 134(150) 144(150)  100 scaption 100 scaption (120) flexion  Shoulder abduction 82(120) 109(120) 118(120) 72(102) 892(887) 107(118)  Shoulder adduction        Shoulder extension  Shoulder internal rotation        Shoulder external rotation        Elbow flexion [facility] WFL 148 WFL WFL  Elbow extension [facility] WFL  -60(-28) -35(-18) -26(-13)  Wrist flexion 26(60) 30(60) 36(60) WFL WFL 60(70)  Wrist extension 44(62) 46(62) 52(62) WFL WFL 70  Wrist ulnar deviation        Wrist radial deviation        Wrist pronation        Wrist supination        (Blank rows = not tested)  Eval: Digit flexion to the [facility]:    Right: 2nd: 5cm(2cm) 3rd: 0cm  4th: 0cm 5th: 5cm(3cm)     Left: 2nd: 1cm(1cm) 3rd: 2cm(0cm) 4th: 4cm(0cm) 5th: 5cm(3cm)    06/27/24:  Digit flexion to the [facility]:    Right: 2nd: 4.5cm(2cm) 3rd: 2.5cm(0cm)  4th: 2cm  (0cm) 5th: 5cm(2cm)     Left: 2nd: 1cm(1cm) 3rd: 1.58m(1cm) 4th: 1.5cm(1cm) 5th: 5cm(1.5cm)   08/07/24:  Digit flexion to the [facility]:    Right: 2nd: 3.5cm(1.5cm) 3rd: 0cm(0cm)  4th: 1.5cm (0cm) 5th: 5cm(1.5cm)     Left: 2nd: 0cm(0cm) 3rd: o)m(0cm) 4th: 0cm(0cm) 5th: 4.5cm(1.5cm)  UPPER EXTREMITY MMT:     MMT Right eval Right  06/27/24 Right 08/07/24 Left eval Left 06/27/24 Left 08/07/24  Shoulder flexion  3+/5 4-/5  3-/5 3-/5  Shoulder abduction 3-/5 3+/5 3+/5 3-/5 3+/5 3+/5  Shoulder adduction        Shoulder extension        Shoulder internal rotation        Shoulder external rotation        Middle trapezius        Lower trapezius        Elbow flexion 4/5 4/5 4/5 4/5 4/5 4/5  Elbow extension 4/5 4/5 4/5 2+/5 3-/5 3-/5  Wrist flexion 3-/5 3-/5 3-/5 4-/5 4-/5 4-/5  Wrist extension 3-/5 3/5 3+/5 4-/5 4-/5 4-/5  Wrist ulnar deviation        Wrist radial deviation        Wrist pronation        Wrist supination        (Blank rows = not tested)  HAND FUNCTION: 05/30/24: Grip strength: Right: 8 lbs; Left: 12 lbs, Lateral pinch: Right: 4 lbs, Left: 3 lbs, and 3 point pinch: Right: 3 lbs, Left: 3 lbs  06/27/24: Grip strength: Right: 11 lbs; Left: 12 lbs, Lateral pinch: Right: 4 lbs, Left: 3 lbs, and 3 point pinch: Right: 3 lbs, Left: 3 lbs  08/07/24: Grip strength: Right: 15 lbs; Left: 18 lbs, Lateral pinch: Right: 10 lbs, Left: 9 lbs, and 3 point pinch: Right: 5 lbs, Left: 3 lbs   COORDINATION:  COORDINATION: Eval:   9 Hole Peg test: Right: 48 sec; Left: 49 sec  06/27/24:  9 Hole Peg test: Right: 33 sec; Left: 49 sec  08/07/24:   9 Hole Peg test: Right: 31 sec; Left: 44 sec.  SENSATION: TBD   EDEMA: N/A    COGNITION: Overall cognitive status: Within functional limits for tasks assessed  VISION: Subjective report: No change from baseline  PERCEPTION: [facility]  TREATMENT DATE: 09/12/24  Therapeutic Ex.:    -Performed AROM, followed by PROM to the end range for bilateral shoulder flexion, abduction, external rotation, horizontal abduction. Emphasis was placed on slow prolonged gentle stretching 2/2 stiffness and soreness. -Pt. tolerated bilateral AROM/AAROM/PROM for right bilateral 5th digit MP, PIP, and DIP flexion to prepare for formulating a composite fist. -Performed BUE strengthening using a 1.5# dowel ex. Modified to 1#  2/2 to weakness. Bilateral shoulder flexion, and chest press for 1 set 5 reps each. -Performed BUE strengthening using 2# hand weight for elbow flexion, and extension, and forearm supination/pronation, 1# for wrist flexion/extension, and radial deviation.     Therapeutic Activities:   -Facilitated [facility] grasp for 1 resistive cubes using a 3pt. grasp pattern while the board is placed on a flat tabletop surface. -Facilitated isolated 2nd digit extension to press them into place.     PATIENT EDUCATION: Education details: digit ROM, UE strengthening Person educated: Patient Education method: Explanation, Demonstration, Tactile cues, and Verbal cues Education comprehension: verbalized understanding, returned demonstration, verbal cues required, tactile cues required, and needs further education  HOME EXERCISE PROGRAM:    -Bilateral elbow flexion, and extension with yellow theraband.  -Yellow theraputty strengthening  following a HEP through [employer]. -contrasting heat 3 min., followed by 1 min. Cold  for 1 min. Ending with heat for 3 min. To the right hand. (Pt. Prefers just heat at home), moist heat to the left hand. -Massage to the right hand  -Right hand digit extension with the hand flat at the tabletop surface -Blue resistive foam block for Bilateral gross gripping, lateral pinch, and 3pt. Pinch strength -UE shoulder stabilization in supine with the shoulder flexed to 90 degrees with the elbow extended-formulating  the alphabet    GOALS: Goals reviewed with patient? Yes  SHORT TERM GOALS: Target date: 07/07/2024     Pt. Will be independent with HEPs for UE strength, and coordination Baseline: 06/27/24: Independent; continue Eval: No current HEP Goal status: Ongoing   LONG TERM GOALS: Target date: 08/18/2024   To reduce pain by 3 grades on the pain scale in preparation for functional hand use during ADLs/IADLs Baseline: 06/27/24: 0/10 pain in the right hand. Pt. reports discomfort intermittently with left 5th digit flexion. Eval:  5/10 pain in the right hand Goal status: Progressed; Achieved  2.  Pt. Will formulate a full composite fist in preparation for securely holding kitchen/cooking utensils. Baseline:08/07/24: Digit flexion to the [facility]: Right: 2nd: 3.5cm(1.5cm) 3rd: 0cm(0cm)  4th: 1.5cm (0cm) 5th: 5cm(1.5cm) Left: 2nd: 0cm(0cm) 3rd: o)m(0cm) 4th: 0cm(0cm) 5th: 4.5cm(1.5cm) 06/27/24: Digit flexion to the [facility]: Right: 2nd: 4.5cm(2cm) 3rd: 2.5cm(0cm)  4th: 2cm (0cm) 5th: 5cm(2cm) Left: 2nd: 1cm(1cm) 3rd: 1.31m(1cm) 4th: 1.5cm(1cm) 5th: 5cm(1.5cm) Pt. Had difficulty with holding and using utensils during self-feeding, Pt. Is improving with larger cooking utensilsEval: Digit flexion to the [facility]:Mphyu: 2nd: 5cm(2cm) 3rd: 0cm  4th: 0cm 5th: 5cm(3cm) Left: 2nd: 1cm(1cm) 3rd: 2cm(0cm) 4th: 4cm(0cm) 5th: 5cm(3cm) Pt, has difficulty securely holding cooking utensils in her right hand Goal status: Progressing, Ongoing  3.  Pt. Will efficiently perform hand to mouth patterns during self-feeding with modified independence using her right hand. Baseline: 08/07/24: Pt. continues to have difficulty bringing her spoon all the way to her mouth, continues to need to bring her mouth down to her spoon.06/27/24: Pt. continues to have difficulty bringing her spoon all the way to her mouth, often needing to bring her mouth down to her spoon. Eval: Pt. Has difficulty completing hand  to mouth patterns, often bringing her mouth  down to meet the spoon 2/2 weakness in the RUE/hand Goal status: Progressing, Ongoing  4.  Pt. Will cut meat on her plate efficiently with modified independence Baseline: 08/07/24: Pt. reports that she is now able to cut meat. 06/27/24: Pt. Is improving with cutting meat, over continues to have difficulty cutting meat. Eval: Pt. Has difficulty cutting meat Goal status: Achieved  5.  Pt. Will increase bilateral shoulder abduction by 10 degrees be able to efficiently perform hair care Baseline: 08/07/24:  Shoulder abduction: Right: 118(120), Left: 107(112) Pt. is now able to brush reach up further to the back of her head.06/27/24: Shoulder abduction: Right: 109(120), Left: 107(112) Pt. is able to reach up further to her head, however has difficulty sustaining BUE in elevation while performing hair care. Eval: shoulder abduction: Right: 82(120), Left: 72(102) Goal status: Progressing  6.  Pt. Will write one sentence efficiently  with 75% in preparation for being able to fill in crossword puzzles. Baseline: 08/07/24: Continue 06/27/24: Name only with 90% legibility; one sentence completed with 50% legibility Eval: writing legibility: 75% for name only. Goal status: INITIAL  7. Pt. Will improve left elbow extension to be able to efficiently reach out for items on the table.    Baseline: Elbow extension: -26(-13)    Goal status: New    8. Pt. Will improve BUE strength to assist with ADLs, and IADL tasks  Baseline: 08/07/24: UE strength: Right Shoulder flexion: 4-/5, abduction: 3+/5, elbow flexion: 4/5, extension: 4/5, wrist flexion: 3-/5, extension: 3+/5; Left Shoulder flexion: 3-/5, abduction: 3+/5, elbow flexion: 4/5, extension: 3-/5, wrist flexion: 4-/5, extension: 4-/5  Goal status: New  9. Pt. will improve bilateral grip strength by 5# to be able to hold items more securely for ADLs/IADLs  Baseline: 08/07/24: Grip strength: Right: 15# Left: 18#  Goal status: New  10.  Pt. Will improve  bilateral [facility] skills in order to be able to manipulate small objects  Baseline: 08/07/24: 9 Hole Peg test: Right: 31 sec; Left: 44 sec.   Goal status: New   ASSESSMENT:  CLINICAL IMPRESSION:  Pt. tolerated the bilateral UE stretches well. Pt. presents with limited AROM reps 2/2 weakness. Pt. required the dowel weight to be modified from 1.5# to 1#. Pt. continues to present with limited bilateral 5th digit MP, PIP, and DIP ROM. Pt. Tolerated the distal strengthening exercises with the hand weights. Pt.'s UE's fatigue with reaching to place the resistive cubes, requiring the position of the cube board to be adjusted closer to accommodate the UEs.  Pt. continues to benefit from OT services to work on improving BUE functioning in order to increase engagement of the UEs during ADLs, and IADL tasks.    PERFORMANCE DEFICITS: in functional skills including ADLs, IADLs, coordination, dexterity, ROM, strength, pain, Fine motor control, Gross motor control, and UE functional use, , and psychosocial skills including coping strategies, environmental adaptation, habits, interpersonal interactions, and routines and behaviors.   IMPAIRMENTS: are limiting patient from ADLs, IADLs, and leisure.   CO-MORBIDITIES: may have co-morbidities  that affects occupational performance. Patient will benefit from skilled OT to address above impairments and improve overall function.  MODIFICATION OR ASSISTANCE TO COMPLETE EVALUATION: Min-Moderate modification of tasks or assist with assess necessary to complete an evaluation.  OT OCCUPATIONAL PROFILE AND HISTORY: Detailed assessment: Review of records and additional review of physical, cognitive, psychosocial history related to current functional performance.  CLINICAL DECISION MAKING: Moderate - several treatment options,  min-mod task modification necessary  REHAB POTENTIAL: Good  EVALUATION COMPLEXITY: Moderate   PLAN:  OT FREQUENCY: 2x/week  OT DURATION: 12  weeks  PLANNED INTERVENTIONS: 97168 OT Re-evaluation, 97535 self care/ADL training, 97110 therapeutic exercise, 97530 therapeutic activity, 97112 neuromuscular re-education, 97140 manual therapy, 97018 paraffin, 97010 moist heat, 97010 cryotherapy, 97034 contrast bath, 97760 Orthotic Initial, 97763 Orthotic/Prosthetic subsequent, passive range of motion, balance training, functional mobility training, energy conservation, coping strategies training, patient/family education, and DME and/or AE instructions  RECOMMENDED OTHER SERVICES: PT for balance  CONSULTED AND AGREED WITH PLAN OF CARE: Patient  PLAN FOR NEXT SESSION:  Assess sensation, and hand function skills at the next visit, as well administer the MAM-20 outcome measure; treatment  Richardson Otter, MS, OTR/L  09/12/2024, 4:51 PM           "

## 2024-09-12 NOTE — Therapy (Signed)
 "  OUTPATIENT PHYSICAL THERAPY NEURO TREATMENT   Patient Name: Maria Lowe MRN: [id number] DOB:Dec 23, 1930, 89 y.o., female Today's Date: 09/12/2024   PCP: Garnette KIDD. Katrinka, MD REFERRING PROVIDER: Garnette KIDD. Katrinka, MD  END OF SESSION:  PT End of Session - 09/12/24 1105     Visit Number 7    Number of Visits 24    Date for Recertification  10/25/24    Progress Note Due on Visit 10    PT Start Time 1100    PT Stop Time 1142    PT Time Calculation (min) 42 min    Equipment Utilized During Treatment Gait belt    Activity Tolerance Patient tolerated treatment well    Behavior During Therapy WFL for tasks assessed/performed           Past Medical History:  Diagnosis Date   Anticoagulant long-term use    eliquis -- managed by cardiology   Arthritis    knees   CKD (chronic kidney disease), stage III (HCC)    CLL (chronic lymphocytic leukemia) (HCC) 11/27/2013   oncologist-- dr lonn;  initial dx 03/ 2015, no treatment, active survillance   Essential hypertension    followed by pcp   Full dentures    History of gastric ulcer    stomach ulcer when young   Lymphocytosis 2009   followed oncology   Permanent atrial fibrillation (HCC) 11/2020   cardiologist-- primary dr w. barbaraann and followed by atrial clinic;    dx 003/ 2022, atenolol  for rate control and on eliquis    Rectocele    Thrombocythemia 2009   related to CLL, followed by dr lonn   Urinary incontinence    Vaginal atrophy    Past Surgical History:  Procedure Laterality Date   APPENDECTOMY     1980s   CATARACT EXTRACTION W/ INTRAOCULAR LENS IMPLANT Bilateral 2020   RECTOCELE REPAIR N/A 08/25/2021   Procedure: POSTERIOR REPAIR (RECTOCELE);  Surgeon: Jertson, Jill Evelyn, MD;  Location: [facility];  Service: Gynecology;  Laterality: N/A;   TONSILLECTOMY AND ADENOIDECTOMY     age 74   Patient Active Problem List   Diagnosis Date Noted   Severe mitral regurgitation 05/23/2024    Deficiency anemia 04/21/2024   Dehydration 04/21/2024   Acute left-sided low back pain with left-sided sciatica 06/25/2022   H/O rectocele repair 08/25/2021   Secondary hypercoagulable state 12/10/2020   Persistent atrial fibrillation (HCC) 11/29/2020   Hyperlipidemia, unspecified 04/07/2018   CKD (chronic kidney disease), stage III (HCC) 02/09/2018   Rash 02/09/2018   Seborrheic dermatitis 01/08/2017   Former smoker 09/30/2015   Rectocele 03/29/2015   Thrombocytopenia 11/26/2014   CLL (chronic lymphocytic leukemia) (HCC) 11/27/2013   Hyperglycemia 05/08/2013   Essential hypertension, benign 11/07/2012   Obesity, unspecified 11/07/2012    ONSET DATE: May 2025  REFERRING DIAG: Severe muscle deconditioning  THERAPY DIAG:  Muscle weakness (generalized)  Unsteadiness on feet  Difficulty in walking, not elsewhere classified  Rationale for Evaluation and Treatment: Rehabilitation  SUBJECTIVE:  SUBJECTIVE STATEMENT:  Pt reports she is doing well. Is ready for christmas   Pt accompanied by: family member (daughter)  PERTINENT HISTORY:  Pt was referred to outpatient PT for muscle deconditioning. Pt experienced a cat bite that required hospitalization while she was in [place]. Daughter states she was quite ill and was on bed rest for 3 weeks, which she states contributed to her decline in function. Prior to hospitalization patient was ambulating with a cane and independent with all activities. Currently she ambulates with a rollator, but would like to work toward ambulating with a cane. Pt enjoys reading and cooking. Her goals are to ambulated with LRAD, navigate stairs with confidence, and increase tolerance for cooking.   PAIN:  Are you having pain? Occasional arthritic pain, primarily right  knee  PRECAUTIONS: None  RED FLAGS: None   WEIGHT BEARING RESTRICTIONS: No  FALLS: Has patient fallen in last 6 months? Yes. Number of falls 3   LIVING ENVIRONMENT: Lives with: lives with their daughter Lives in: House/apartment Stairs: Yes; two steps at daughters house/ currently ramped Has following equipment at home: Counselling psychologist, Environmental Consultant - 4 wheeled, shower chair, Grab bars, and hospital bed  PLOF: Independent with basic ADLs   PATIENT GOALS: Pt would like to work on getting in and out of chairs, increasing tolerance for cooking, and using LRAD.    OBJECTIVE:  Note: Objective measures were completed at Evaluation unless otherwise noted.   COGNITION: Overall cognitive status: Within functional limits for tasks assessed    LOWER EXTREMITY ROM:     Active  Right Eval Left Eval  Hip flexion    Hip extension    Hip abduction    Hip adduction    Hip internal rotation    Hip external rotation    Knee flexion    Knee extension    Ankle dorsiflexion    Ankle plantarflexion    Ankle inversion    Ankle eversion     (Blank rows = not tested)  LOWER EXTREMITY MMT:    MMT Right Eval Left Eval  Hip flexion 4+ 4+  Hip extension    Hip abduction 3+ 3+  Hip adduction 3+ 3+  Hip internal rotation    Hip external rotation    Knee flexion 4+ 4+  Knee extension 4+ 4+  Ankle dorsiflexion 4 4  Ankle plantarflexion 3+ 3+  Ankle inversion    Ankle eversion    (Blank rows = not tested)  BED MOBILITY:  Pt reports no difficulty with bed mobility, but utilizes a hospital bed  RAMP:  Pt reports unsteadiness with ramps  STAIRS: To be assessed at next visit GAIT: Findings: Distance walked: 656ft, Assistive device utilized:Walker - 4 wheeled  FUNCTIONAL TESTS:  5 times sit to stand: 16.95 sec completed with UE support Timed up and go (TUG): 23.59 sec completed w/ 5TT 10 meter walk test: 16.34 sec; 0.61 m/s completed w/ 5TT 6 minute walk test: completed 5:07  min, 612 ft completed w/ 5TT Item Test date: 08/07/24  Sitting to standing 2. able to stand using hands after several tries  2. Standing unsupported 4. able to stand safely for 2 minutes  3. Sitting with back unsupported, feet supported 4. able to sit safely and securely for 2 minutes  4. Standing to sitting 4. sits safely with minimal use of hands  5. Pivot transfer  3. able to transfer safely with definite need of hands  6. Standing unsupported with eyes closed  3. able to stand 10 seconds with supervision  7. Standing unsupported with feet together 1. needs help to attain position but able to stand 15 seconds feet together  8. Reaching forward with outstretched arms while standing 2. can reach forward 5 cm (2 inches)  9. Pick up object from the floor from standing 1. unable to pick up and needs supervision while trying  10. Turning to look behind over left and right shoulders while standing 1. needs supervision when turning  11. Turn 360 degrees 1. needs close supervision or verbal cuing  12. Place alternate foot on step or stool while standing unsupported 0. needs assistance to keep from falling/unable to try  13. Standing unsupported one foot in front 0. loses balance while stepping or standing  14. Standing on one leg 0. unable to try of needs assist to prevent fall    Total Score 26/56     PATIENT SURVEYS:  ABC scale: 37.5%                                                                                                                              TREATMENT DATE: 09/12/2024   TherAct: To improve functional movements patterns for everyday tasks  Nustep B UE and LE reciprocal movement training seat 7 - consideration for R knee lack of flexion ROM. L 1 x 6 min   Gait x 181ft with SPC x 3 -1 round of 150 ft performed at a time. Repeated after sit to stands  Sit to stands 2 x 8  -VC to reach forward before coming to stand  Alternating lateral side step at bar with intermittent UE  support 2 x 10 with RTB around knees   NMR: To facilitate reeducation of movement, balance, posture, coordination, and/or proprioception/kinesthetic sense.   SLS with foot elevated on soccer ball 3 x 30 sec  -Intermittent UE support - last rep no UE support - very challenging!   Standing airex NBOS 4 x 30 sec -EC all rounds,   Unless otherwise stated, CGA was provided and gait belt donned in order to ensure pt safety   Pt required occasional rest breaks due fatigue, PT was attentive to when pt appeared to be tired or winded in order to prevent excessive fatigue.   PATIENT EDUCATION: Education details: POC Person educated: Patient Education method: Explanation Education comprehension: verbalized understanding   HOME EXERCISE PROGRAM: Establish visit 2     GOALS: Goals reviewed with patient? Yes  SHORT TERM GOALS: Target date: 09/13/2024  Patient will be independent with HEP to improve strength and mobility for better functional independence with ADL's Baseline: Establish at second visit Goal status: INITIAL   LONG TERM GOALS: Target date: 10/25/2024  Patient will improve 5xSTS to 12 seconds or less to demonstrate improved strength and balance. Baseline: 16.95 sec with UE support Goal status: INITIAL  2.  Patient will decrease TUG score by 8 seconds or more with LRAD to  indicate decreased fall risk and improved transfer ability. Baseline:  23.59 sec completed with 4WW Goal status: INITIAL  3.  Patient will improve by >1.0 m/s with LRAD for improved community ambulation and indicate a decreased fall risk. Baseline: 0.61 m/s completed with 4WW Goal status: INITIAL  4.  Patient will improve to >800 ft with LRAD for progression toward community ambulator.  Baseline: 5:07 min, 612 ft. Completed with 4WW Goal status: INITIAL  5.  Patient will improve BERG by 6 points or more to demonstrate decreased fall risk with functional activities.  Baseline: To be assessed  at visit 2 Goal status: INITIAL  6.  Patient will be able to navigate 4 steps with step to pattern using UE support and supervision in order to enter daughter's home.  Baseline: to be assessed at visit 2 Goal status: INITIAL   ASSESSMENT:  CLINICAL IMPRESSION:  Pt arrived with good motivation for completion of PT activities. Today's session included gait with SPC, sit to stands, and balance activities.  Patient very challenged with single-leg stance progression involving standing with 1 foot on floor and other foot on soccer ball anterior to patient.  Of this was challenging patient did show improvement with repetitions.  Pt will continue to benefit from skilled therapy to address remaining deficits in order to improve overall QoL and return to PLOF.    OBJECTIVE IMPAIRMENTS: Abnormal gait, decreased activity tolerance, decreased balance, decreased endurance, decreased knowledge of use of DME, and decreased strength.   ACTIVITY LIMITATIONS: carrying, lifting, squatting, stairs, transfers, and reach over head  PARTICIPATION LIMITATIONS: meal prep, cleaning, laundry, and community activity  PERSONAL FACTORS: Age and 3+ comorbidities: CKD, CLL, HTN are also affecting patient's functional outcome.   REHAB POTENTIAL: Good  CLINICAL DECISION MAKING: Evolving/moderate complexity  EVALUATION COMPLEXITY: Moderate  PLAN:  PT FREQUENCY: 2x/week  PT DURATION: 12 weeks  PLANNED INTERVENTIONS: 97110-Therapeutic exercises, 97530- Therapeutic activity, 97112- Neuromuscular re-education, 97535- Self Care, [id number]- Manual therapy, [phone]- Gait training, Patient/Family education, Balance training, Stair training, Vestibular training, and DME instructions  PLAN FOR NEXT SESSION:  Endurance and functional strength SPC training for community tasks  Balance activities- SLS   Lonni KATHEE Gainer PT ,DPT Physical Therapist- [place]  [facility]    "

## 2024-09-19 ENCOUNTER — Ambulatory Visit

## 2024-09-19 ENCOUNTER — Ambulatory Visit: Admitting: Occupational Therapy

## 2024-09-19 DIAGNOSIS — M6281 Muscle weakness (generalized): Secondary | ICD-10-CM | POA: Diagnosis not present

## 2024-09-19 DIAGNOSIS — R262 Difficulty in walking, not elsewhere classified: Secondary | ICD-10-CM

## 2024-09-19 DIAGNOSIS — R278 Other lack of coordination: Secondary | ICD-10-CM

## 2024-09-19 DIAGNOSIS — R2681 Unsteadiness on feet: Secondary | ICD-10-CM

## 2024-09-19 NOTE — Therapy (Signed)
 "  OUTPATIENT PHYSICAL THERAPY NEURO TREATMENT   Patient Name: Maria Lowe MRN: [id number] DOB:1931-01-07, 89 y.o., female Today's Date: 09/19/2024   PCP: Garnette KIDD. Katrinka, MD REFERRING PROVIDER: Garnette KIDD. Katrinka, MD  END OF SESSION:  PT End of Session - 09/19/24 0841     Visit Number 8    Number of Visits 24    Date for Recertification  10/25/24    Progress Note Due on Visit 10    PT Start Time 0930    PT Stop Time 1013    PT Time Calculation (min) 43 min    Equipment Utilized During Treatment Gait belt    Activity Tolerance Patient tolerated treatment well    Behavior During Therapy WFL for tasks assessed/performed           Past Medical History:  Diagnosis Date   Anticoagulant long-term use    eliquis -- managed by cardiology   Arthritis    knees   CKD (chronic kidney disease), stage III (HCC)    CLL (chronic lymphocytic leukemia) (HCC) 11/27/2013   oncologist-- dr lonn;  initial dx 03/ 2015, no treatment, active survillance   Essential hypertension    followed by pcp   Full dentures    History of gastric ulcer    stomach ulcer when young   Lymphocytosis 2009   followed oncology   Permanent atrial fibrillation (HCC) 11/2020   cardiologist-- primary dr w. barbaraann and followed by atrial clinic;    dx 003/ 2022, atenolol  for rate control and on eliquis    Rectocele    Thrombocythemia 2009   related to CLL, followed by dr lonn   Urinary incontinence    Vaginal atrophy    Past Surgical History:  Procedure Laterality Date   APPENDECTOMY     1980s   CATARACT EXTRACTION W/ INTRAOCULAR LENS IMPLANT Bilateral 2020   RECTOCELE REPAIR N/A 08/25/2021   Procedure: POSTERIOR REPAIR (RECTOCELE);  Surgeon: Jertson, Jill Evelyn, MD;  Location: [facility];  Service: Gynecology;  Laterality: N/A;   TONSILLECTOMY AND ADENOIDECTOMY     age 39   Patient Active Problem List   Diagnosis Date Noted   Severe mitral regurgitation 05/23/2024    Deficiency anemia 04/21/2024   Dehydration 04/21/2024   Acute left-sided low back pain with left-sided sciatica 06/25/2022   H/O rectocele repair 08/25/2021   Secondary hypercoagulable state 12/10/2020   Persistent atrial fibrillation (HCC) 11/29/2020   Hyperlipidemia, unspecified 04/07/2018   CKD (chronic kidney disease), stage III (HCC) 02/09/2018   Rash 02/09/2018   Seborrheic dermatitis 01/08/2017   Former smoker 09/30/2015   Rectocele 03/29/2015   Thrombocytopenia 11/26/2014   CLL (chronic lymphocytic leukemia) (HCC) 11/27/2013   Hyperglycemia 05/08/2013   Essential hypertension, benign 11/07/2012   Obesity, unspecified 11/07/2012    ONSET DATE: May 2025  REFERRING DIAG: Severe muscle deconditioning  THERAPY DIAG:  Muscle weakness (generalized)  Unsteadiness on feet  Difficulty in walking, not elsewhere classified  Other lack of coordination  Rationale for Evaluation and Treatment: Rehabilitation  SUBJECTIVE:  SUBJECTIVE STATEMENT:  Patient reports using mostly her cane at home.   Pt accompanied by: family member (daughter)  PERTINENT HISTORY:  Pt was referred to outpatient PT for muscle deconditioning. Pt experienced a cat bite that required hospitalization while she was in [place]. Daughter states she was quite ill and was on bed rest for 3 weeks, which she states contributed to her decline in function. Prior to hospitalization patient was ambulating with a cane and independent with all activities. Currently she ambulates with a rollator, but would like to work toward ambulating with a cane. Pt enjoys reading and cooking. Her goals are to ambulated with LRAD, navigate stairs with confidence, and increase tolerance for cooking.   PAIN:  Are you having pain? Occasional arthritic  pain, primarily right knee  PRECAUTIONS: None  RED FLAGS: None   WEIGHT BEARING RESTRICTIONS: No  FALLS: Has patient fallen in last 6 months? Yes. Number of falls 3   LIVING ENVIRONMENT: Lives with: lives with their daughter Lives in: House/apartment Stairs: Yes; two steps at daughters house/ currently ramped Has following equipment at home: Counselling psychologist, Environmental Consultant - 4 wheeled, shower chair, Grab bars, and hospital bed  PLOF: Independent with basic ADLs   PATIENT GOALS: Pt would like to work on getting in and out of chairs, increasing tolerance for cooking, and using LRAD.    OBJECTIVE:  Note: Objective measures were completed at Evaluation unless otherwise noted.   COGNITION: Overall cognitive status: Within functional limits for tasks assessed    LOWER EXTREMITY ROM:     Active  Right Eval Left Eval  Hip flexion    Hip extension    Hip abduction    Hip adduction    Hip internal rotation    Hip external rotation    Knee flexion    Knee extension    Ankle dorsiflexion    Ankle plantarflexion    Ankle inversion    Ankle eversion     (Blank rows = not tested)  LOWER EXTREMITY MMT:    MMT Right Eval Left Eval  Hip flexion 4+ 4+  Hip extension    Hip abduction 3+ 3+  Hip adduction 3+ 3+  Hip internal rotation    Hip external rotation    Knee flexion 4+ 4+  Knee extension 4+ 4+  Ankle dorsiflexion 4 4  Ankle plantarflexion 3+ 3+  Ankle inversion    Ankle eversion    (Blank rows = not tested)  BED MOBILITY:  Pt reports no difficulty with bed mobility, but utilizes a hospital bed  RAMP:  Pt reports unsteadiness with ramps  STAIRS: To be assessed at next visit GAIT: Findings: Distance walked: 673ft, Assistive device utilized:Walker - 4 wheeled  FUNCTIONAL TESTS:  5 times sit to stand: 16.95 sec completed with UE support Timed up and go (TUG): 23.59 sec completed w/ 5TT 10 meter walk test: 16.34 sec; 0.61 m/s completed w/ 5TT 6 minute walk  test: completed 5:07 min, 612 ft completed w/ 5TT Item Test date: 08/07/24  Sitting to standing 2. able to stand using hands after several tries  2. Standing unsupported 4. able to stand safely for 2 minutes  3. Sitting with back unsupported, feet supported 4. able to sit safely and securely for 2 minutes  4. Standing to sitting 4. sits safely with minimal use of hands  5. Pivot transfer  3. able to transfer safely with definite need of hands  6. Standing unsupported with eyes closed 3. able  to stand 10 seconds with supervision  7. Standing unsupported with feet together 1. needs help to attain position but able to stand 15 seconds feet together  8. Reaching forward with outstretched arms while standing 2. can reach forward 5 cm (2 inches)  9. Pick up object from the floor from standing 1. unable to pick up and needs supervision while trying  10. Turning to look behind over left and right shoulders while standing 1. needs supervision when turning  11. Turn 360 degrees 1. needs close supervision or verbal cuing  12. Place alternate foot on step or stool while standing unsupported 0. needs assistance to keep from falling/unable to try  13. Standing unsupported one foot in front 0. loses balance while stepping or standing  14. Standing on one leg 0. unable to try of needs assist to prevent fall    Total Score 26/56     PATIENT SURVEYS:  ABC scale: 37.5%                                                                                                                              TREATMENT DATE: 09/19/2024   TherAct: To improve functional movements patterns for everyday tasks  Nustep B UE and LE reciprocal movement training seat 7 - consideration for R knee lack of flexion ROM. L 1 x 6 min   Gait x 330 ft with SPC - Short reciprocal steps- able to converse and no LOB today- did endorse some SOB- O2 sat= 96%   Fwd/retro walking with 3# AW x 15 feet and back x 8- VC for increased  step length.    Sit to stands 2 x 10  -VC to lean forward but keep looking ahead rather than down  lateral side stepping along 15 feet distance (down and back) with use of SPC and 3# AW on each LE x 5   NMR: To facilitate reeducation of movement, balance, posture, coordination, and/or proprioception/kinesthetic sense.   Step taps onto 1st step (attempted without UE support- unable- fearful) - performed 20 reps alt LE with 1 UE (light touch)   Step taps onto 1.5 in  without UE support (5 total times without any UE support- still very fearful- close CGA. - 20 total times with light UE touch)     Unless otherwise stated, CGA was provided and gait belt donned in order to ensure pt safety   Pt required occasional rest breaks due fatigue, PT was attentive to when pt appeared to be tired or winded in order to prevent excessive fatigue.   PATIENT EDUCATION: Education details: POC Person educated: Patient Education method: Explanation Education comprehension: verbalized understanding   HOME EXERCISE PROGRAM: Establish visit 2     GOALS: Goals reviewed with patient? Yes  SHORT TERM GOALS: Target date: 09/13/2024  Patient will be independent with HEP to improve strength and mobility for better functional independence with ADL's Baseline: Establish at second visit Goal status: INITIAL  LONG TERM GOALS: Target date: 10/25/2024  Patient will improve 5xSTS to 12 seconds or less to demonstrate improved strength and balance. Baseline: 16.95 sec with UE support Goal status: INITIAL  2.  Patient will decrease TUG score by 8 seconds or more with LRAD to indicate decreased fall risk and improved transfer ability. Baseline:  23.59 sec completed with 4WW Goal status: INITIAL  3.  Patient will improve by >1.0 m/s with LRAD for improved community ambulation and indicate a decreased fall risk. Baseline: 0.61 m/s completed with 4WW Goal status: INITIAL  4.  Patient will improve to >800 ft with  LRAD for progression toward community ambulator.  Baseline: 5:07 min, 612 ft. Completed with 4WW Goal status: INITIAL  5.  Patient will improve BERG by 6 points or more to demonstrate decreased fall risk with functional activities.  Baseline: To be assessed at visit 2 Goal status: INITIAL  6.  Patient will be able to navigate 4 steps with step to pattern using UE support and supervision in order to enter daughter's home.  Baseline: to be assessed at visit 2 Goal status: INITIAL   ASSESSMENT:  CLINICAL IMPRESSION: Treatment continues to focus on dynamic balance and overall functional endurance/walking with least restrictive assistive device.  Patient continues to be very challenged with single-leg stance yet was able to step tap without UE support on very small 1.5 in step height- unable to perform today on normal step height. Fearful of falling with stepping today without some form of UE support and will benefit from ongoing SLS and dynamic balance activities.  Pt will continue to benefit from skilled therapy to address remaining deficits in order to improve overall QoL and return to PLOF.    OBJECTIVE IMPAIRMENTS: Abnormal gait, decreased activity tolerance, decreased balance, decreased endurance, decreased knowledge of use of DME, and decreased strength.   ACTIVITY LIMITATIONS: carrying, lifting, squatting, stairs, transfers, and reach over head  PARTICIPATION LIMITATIONS: meal prep, cleaning, laundry, and community activity  PERSONAL FACTORS: Age and 3+ comorbidities: CKD, CLL, HTN are also affecting patient's functional outcome.   REHAB POTENTIAL: Good  CLINICAL DECISION MAKING: Evolving/moderate complexity  EVALUATION COMPLEXITY: Moderate  PLAN:  PT FREQUENCY: 2x/week  PT DURATION: 12 weeks  PLANNED INTERVENTIONS: 97110-Therapeutic exercises, 97530- Therapeutic activity, 97112- Neuromuscular re-education, 97535- Self Care, [id number]- Manual therapy, [phone]- Gait training,  Patient/Family education, Balance training, Stair training, Vestibular training, and DME instructions  PLAN FOR NEXT SESSION:  Endurance and functional strength SPC training for community tasks  Balance activities- SLS   Reyes LOISE London PT  Physical Therapist- [place]  [facility]    "

## 2024-09-19 NOTE — Therapy (Signed)
 " Occupational Therapy Treatment Note   Patient Name: Maria Lowe MRN: [id number] DOB:1930-10-08, 89 y.o., female Today's Date: 09/19/2024   REFERRING PROVIDER: Katrinka Senior, MD  END OF SESSION:   OT End of Session - 09/19/24 2152     Visit Number 27    Number of Visits 48    Date for Recertification  10/30/24    OT Start Time 0845    OT Stop Time 0930    OT Time Calculation (min) 45 min    Activity Tolerance Patient tolerated treatment well    Behavior During Therapy [facility] for tasks assessed/performed           Past Medical History:  Diagnosis Date   Anticoagulant long-term use    eliquis -- managed by cardiology   Arthritis    knees   CKD (chronic kidney disease), stage III (HCC)    CLL (chronic lymphocytic leukemia) (HCC) 11/27/2013   oncologist-- dr lonn;  initial dx 03/ 2015, no treatment, active survillance   Essential hypertension    followed by pcp   Full dentures    History of gastric ulcer    stomach ulcer when young   Lymphocytosis 2009   followed oncology   Permanent atrial fibrillation (HCC) 11/2020   cardiologist-- primary dr w. barbaraann and followed by atrial clinic;    dx 003/ 2022, atenolol  for rate control and on eliquis    Rectocele    Thrombocythemia 2009   related to CLL, followed by dr lonn   Urinary incontinence    Vaginal atrophy    Past Surgical History:  Procedure Laterality Date   APPENDECTOMY     1980s   CATARACT EXTRACTION W/ INTRAOCULAR LENS IMPLANT Bilateral 2020   RECTOCELE REPAIR N/A 08/25/2021   Procedure: POSTERIOR REPAIR (RECTOCELE);  Surgeon: Jertson, Jill Evelyn, MD;  Location: [facility];  Service: Gynecology;  Laterality: N/A;   TONSILLECTOMY AND ADENOIDECTOMY     age 42   Patient Active Problem List   Diagnosis Date Noted   Severe mitral regurgitation 05/23/2024   Deficiency anemia 04/21/2024   Dehydration 04/21/2024   Acute left-sided low back pain with left-sided sciatica 06/25/2022    H/O rectocele repair 08/25/2021   Secondary hypercoagulable state 12/10/2020   Persistent atrial fibrillation (HCC) 11/29/2020   Hyperlipidemia, unspecified 04/07/2018   CKD (chronic kidney disease), stage III (HCC) 02/09/2018   Rash 02/09/2018   Seborrheic dermatitis 01/08/2017   Former smoker 09/30/2015   Rectocele 03/29/2015   Thrombocytopenia 11/26/2014   CLL (chronic lymphocytic leukemia) (HCC) 11/27/2013   Hyperglycemia 05/08/2013   Essential hypertension, benign 11/07/2012   Obesity, unspecified 11/07/2012    ONSET DATE: 01/2024  REFERRING DIAG: Right hand weakness  THERAPY DIAG:  Muscle weakness (generalized)  Rationale for Evaluation and Treatment: Rehabilitation  SUBJECTIVE:   SUBJECTIVE STATEMENT:  Pt. Reports having had a nice holiday with family. Accompanied by self; with daughter in the reception area  PERTINENT HISTORY:  Pt. was referred for outpatient OT services for right hand pain. Pt. had received a cat bite approximately one week prior to her going on a trip to [place]. She was hospitalized in [place] for one month due to developing a Cat Bite Pasturella Infection. While in the hospital, Pt. reports that she was placed in an arm cast. Pt. reports that while she was in the hospital she was bedbound, was not allowed to get out of bed for the entire month, and became weak, and deconditioned. Pt. Reports that she does  not know why she was placed in a cast. [place] return [place] the [place] Pt. started home health OT and PT services, however OT services were limited due to staffing constraints. Pt. was then referred to outpatient OT services. PMHx includes: Severe mitral valve regurgitation, pericadrial, and pleural effusions, A-Fib, Chronic Lymphocytic Anemia, multifactorial anemia and CKD stage III, Diverticulosis, constipation, HTN, Hyperlipidemia, Prediabetes.  PRECAUTIONS: None  WEIGHT BEARING RESTRICTIONS: No  PAIN:  Are you having pain?  No pain  FALLS: Has patient  fallen in last 6 months? 1 fall in the last 6 months  LIVING ENVIRONMENT: Lives with: Resides with her daughter Lives in: House- one level Stairs: ramped entrance Has following equipment at home: Counselling psychologist, Environmental Consultant - 4 wheeled, shower chair, bed side commode, and Grab bars, hospital bed, elevated toilet  PLOF: Independent, living alone, managing a household-per report  PATIENT GOALS:  To be able to cut her meat  OBJECTIVE:  Note: Objective measures were completed at Evaluation unless otherwise noted.  HAND DOMINANCE: Right  ADLs:  Transfers/ambulation related to ADLs: Supervision with a rollator walker Eating: Pt. has difficulty with using utensils with the right hand, often bringing her head down to meet hand/utensil when eating. Pt. Has  weakness with bringing the cup to her mouth. Difficulty with cutting food. Grooming: Difficulty sustaining the BUE's in up long enough to perform hair care. Independent with oral care,  UB Dressing: Independent, requires increased time to complete. Pt. Does endorse having difficulty reaching up to put a shirt on over her head. LB Dressing:Reports being able to perform LE dressing, Uses slide-on footwear.  Toileting:Pt. Reports independently being able to perform toileting care needs using her left hand. (Of note, Pt. Reports that she has always used her left hand to perform toilet hygiene care.) Bathing: Independent, daughter provides supervision from outside the shower curtain. Daughter assists with drying her back due to limited reach Tub Shower transfers: Daughter provides supervision tub/shower t/f's   IADLs: Light housekeeping: Pt. reports that she has resumed some light housekeeping tasks, however this has been limited. Pt. Reports that she does not vacuum, or sweep due to rollator use. Meal Prep: Pt. Has resumed weekly cooking on Sundays. Reports having difficulty cutting onions, as well as difficulty holding and using a beater. Pt.  Reports that she uses a modified technique to hold kitchen/cooking utensils. Community mobility: Pt. Relies on family  and friends for transportation Medication management: Pt. Has difficulty manipulating medication efficiently with the right hand Handwriting: 50% legible for name only Leisure/Hobbies: Loves to cook; enjoys reading , and working crossword puzzles.  MOBILITY STATUS:  Supervision using a rollator  FUNCTIONAL OUTCOME MEASURES: 05/30/24: MAM-20: 11/20 items were completed. The following items Pt. indicated as a 2 -very hard to do: wringing out a towel, opening a wide mouth bottle-previously opened, cutting meat on a plate, tying shoe laces, zipping a jacket, and squeezing toothpaste on a toothbrush. The following items Pt. indicated as a 1-cannot do: opening a medication bottle with a childproof cap, cutting nails with a clipper, buttoning clothing, picking up a 1/2 full water pitcher, and writing 3-4 lines legibly.  UPPER EXTREMITY ROM:     ROM Right eval Right 06/27/24 Right 08/07/24 Left eval Left 06/27/24 Left 08/07/24  Shoulder flexion 130(150) Scaption 134(150) 144(150)  100 scaption 100 scaption (120) flexion  Shoulder abduction 82(120) 109(120) 118(120) 72(102) 892(887) 107(118)  Shoulder adduction        Shoulder extension  Shoulder internal rotation        Shoulder external rotation        Elbow flexion [facility] WFL 148 WFL WFL  Elbow extension [facility] WFL  -60(-28) -35(-18) -26(-13)  Wrist flexion 26(60) 30(60) 36(60) WFL WFL 60(70)  Wrist extension 44(62) 46(62) 52(62) WFL WFL 70  Wrist ulnar deviation        Wrist radial deviation        Wrist pronation        Wrist supination        (Blank rows = not tested)  Eval: Digit flexion to the [facility]:    Right: 2nd: 5cm(2cm) 3rd: 0cm  4th: 0cm 5th: 5cm(3cm)     Left: 2nd: 1cm(1cm) 3rd: 2cm(0cm) 4th: 4cm(0cm) 5th: 5cm(3cm)    06/27/24:  Digit flexion to the [facility]:    Right: 2nd: 4.5cm(2cm) 3rd: 2.5cm(0cm)   4th: 2cm (0cm) 5th: 5cm(2cm)     Left: 2nd: 1cm(1cm) 3rd: 1.1m(1cm) 4th: 1.5cm(1cm) 5th: 5cm(1.5cm)   08/07/24:  Digit flexion to the [facility]:    Right: 2nd: 3.5cm(1.5cm) 3rd: 0cm(0cm)  4th: 1.5cm (0cm) 5th: 5cm(1.5cm)     Left: 2nd: 0cm(0cm) 3rd: o)m(0cm) 4th: 0cm(0cm) 5th: 4.5cm(1.5cm)  UPPER EXTREMITY MMT:     MMT Right eval Right  06/27/24 Right 08/07/24 Left eval Left 06/27/24 Left 08/07/24  Shoulder flexion  3+/5 4-/5  3-/5 3-/5  Shoulder abduction 3-/5 3+/5 3+/5 3-/5 3+/5 3+/5  Shoulder adduction        Shoulder extension        Shoulder internal rotation        Shoulder external rotation        Middle trapezius        Lower trapezius        Elbow flexion 4/5 4/5 4/5 4/5 4/5 4/5  Elbow extension 4/5 4/5 4/5 2+/5 3-/5 3-/5  Wrist flexion 3-/5 3-/5 3-/5 4-/5 4-/5 4-/5  Wrist extension 3-/5 3/5 3+/5 4-/5 4-/5 4-/5  Wrist ulnar deviation        Wrist radial deviation        Wrist pronation        Wrist supination        (Blank rows = not tested)  HAND FUNCTION: 05/30/24: Grip strength: Right: 8 lbs; Left: 12 lbs, Lateral pinch: Right: 4 lbs, Left: 3 lbs, and 3 point pinch: Right: 3 lbs, Left: 3 lbs  06/27/24: Grip strength: Right: 11 lbs; Left: 12 lbs, Lateral pinch: Right: 4 lbs, Left: 3 lbs, and 3 point pinch: Right: 3 lbs, Left: 3 lbs  08/07/24: Grip strength: Right: 15 lbs; Left: 18 lbs, Lateral pinch: Right: 10 lbs, Left: 9 lbs, and 3 point pinch: Right: 5 lbs, Left: 3 lbs   COORDINATION:  COORDINATION: Eval:   9 Hole Peg test: Right: 48 sec; Left: 49 sec  06/27/24:  9 Hole Peg test: Right: 33 sec; Left: 49 sec  08/07/24:   9 Hole Peg test: Right: 31 sec; Left: 44 sec.  SENSATION: TBD   EDEMA: N/A    COGNITION: Overall cognitive status: Within functional limits for tasks assessed  VISION: Subjective report: No change from baseline  PERCEPTION: [facility]  TREATMENT DATE: 09/19/24  Moist heat pack modality to the bilateral hands for 5 min.prior to, and in conjunction with ROM  Manual Therapy:    -Pt. tolerated STM to the volar surface of the bilateral hands, palm, thenar eminence, hypothenar eminence, the volar, dorsal, lateral and medial aspect of the digits, the thumb MCP/IP joints, and the 1st dorsal interossei to increase circulation, and decrease pain 2/2 stiffness -Manual therapy was performed independent of, and in preparation for therapeutic Ex.    Therapeutic Ex.:    -Performed AROM, followed by PROM to the end range for bilateral shoulder flexion, abduction, external rotation, horizontal abduction. Emphasis was placed on slow prolonged gentle stretching 2/2 stiffness and soreness. -Pt. tolerated bilateral AROM/AAROM/PROM for right bilateral  2nd, and 5th digit MP, PIP, and DIP flexion to prepare for formulating a composite fist. -bilaterla hand 2nd through 5th digit extension exercises with the hand positioned flat at the tabletop surface.  PATIENT EDUCATION: Education details: digit ROM, UE strengthening Person educated: Patient Education method: Explanation, Demonstration, Tactile cues, and Verbal cues Education comprehension: verbalized understanding, returned demonstration, verbal cues required, tactile cues required, and needs further education  HOME EXERCISE PROGRAM:    -Bilateral elbow flexion, and extension with yellow theraband.  -Yellow theraputty strengthening  following a HEP through [employer]. -contrasting heat 3 min., followed by 1 min. Cold  for 1 min. Ending with heat for 3 min. To the right hand. (Pt. Prefers just heat at home), moist heat to the left hand. -Massage to the right hand  -Right hand digit extension with the hand flat at the tabletop surface -Blue resistive foam block for Bilateral gross gripping, lateral pinch, and 3pt. Pinch strength -UE shoulder stabilization in supine with the  shoulder flexed to 90 degrees with the elbow extended-formulating the alphabet    GOALS: Goals reviewed with patient? Yes  SHORT TERM GOALS: Target date: 07/07/2024     Pt. Will be independent with HEPs for UE strength, and coordination Baseline: 06/27/24: Independent; continue Eval: No current HEP Goal status: Ongoing   LONG TERM GOALS: Target date: 08/18/2024   To reduce pain by 3 grades on the pain scale in preparation for functional hand use during ADLs/IADLs Baseline: 06/27/24: 0/10 pain in the right hand. Pt. reports discomfort intermittently with left 5th digit flexion. Eval:  5/10 pain in the right hand Goal status: Progressed; Achieved  2.  Pt. Will formulate a full composite fist in preparation for securely holding kitchen/cooking utensils. Baseline:08/07/24: Digit flexion to the [facility]: Right: 2nd: 3.5cm(1.5cm) 3rd: 0cm(0cm)  4th: 1.5cm (0cm) 5th: 5cm(1.5cm) Left: 2nd: 0cm(0cm) 3rd: o)m(0cm) 4th: 0cm(0cm) 5th: 4.5cm(1.5cm) 06/27/24: Digit flexion to the [facility]: Right: 2nd: 4.5cm(2cm) 3rd: 2.5cm(0cm)  4th: 2cm (0cm) 5th: 5cm(2cm) Left: 2nd: 1cm(1cm) 3rd: 1.60m(1cm) 4th: 1.5cm(1cm) 5th: 5cm(1.5cm) Pt. Had difficulty with holding and using utensils during self-feeding, Pt. Is improving with larger cooking utensilsEval: Digit flexion to the I[facility]:Mphyu: 2nd: 5cm(2cm) 3rd: 0cm  4th: 0cm 5th: 5cm(3cm) Left: 2nd: 1cm(1cm) 3rd: 2cm(0cm) 4th: 4cm(0cm) 5th: 5cm(3cm) Pt, has difficulty securely holding cooking utensils in her right hand Goal status: Progressing, Ongoing  3.  Pt. Will efficiently perform hand to mouth patterns during self-feeding with modified independence using her right hand. Baseline: 08/07/24: Pt. continues to have difficulty bringing her spoon all the way to her mouth, continues to need to bring her mouth down to her spoon.06/27/24: Pt. continues to have difficulty bringing her spoon all the way to her mouth, often needing to bring her mouth down  to her spoon. Eval: Pt. Has  difficulty completing hand to mouth patterns, often bringing her mouth down to meet the spoon 2/2 weakness in the RUE/hand Goal status: Progressing, Ongoing  4.  Pt. Will cut meat on her plate efficiently with modified independence Baseline: 08/07/24: Pt. reports that she is now able to cut meat. 06/27/24: Pt. Is improving with cutting meat, over continues to have difficulty cutting meat. Eval: Pt. Has difficulty cutting meat Goal status: Achieved  5.  Pt. Will increase bilateral shoulder abduction by 10 degrees be able to efficiently perform hair care Baseline: 08/07/24:  Shoulder abduction: Right: 118(120), Left: 107(112) Pt. is now able to brush reach up further to the back of her head.06/27/24: Shoulder abduction: Right: 109(120), Left: 107(112) Pt. is able to reach up further to her head, however has difficulty sustaining BUE in elevation while performing hair care. Eval: shoulder abduction: Right: 82(120), Left: 72(102) Goal status: Progressing  6.  Pt. Will write one sentence efficiently  with 75% in preparation for being able to fill in crossword puzzles. Baseline: 08/07/24: Continue 06/27/24: Name only with 90% legibility; one sentence completed with 50% legibility Eval: writing legibility: 75% for name only. Goal status: INITIAL  7. Pt. Will improve left elbow extension to be able to efficiently reach out for items on the table.    Baseline: Elbow extension: -26(-13)    Goal status: New    8. Pt. Will improve BUE strength to assist with ADLs, and IADL tasks  Baseline: 08/07/24: UE strength: Right Shoulder flexion: 4-/5, abduction: 3+/5, elbow flexion: 4/5, extension: 4/5, wrist flexion: 3-/5, extension: 3+/5; Left Shoulder flexion: 3-/5, abduction: 3+/5, elbow flexion: 4/5, extension: 3-/5, wrist flexion: 4-/5, extension: 4-/5  Goal status: New  9. Pt. will improve bilateral grip strength by 5# to be able to hold items more securely for ADLs/IADLs  Baseline: 08/07/24: Grip strength:  Right: 15# Left: 18#  Goal status: New  10.  Pt. Will improve bilateral [facility] skills in order to be able to manipulate small objects  Baseline: 08/07/24: 9 Hole Peg test: Right: 31 sec; Left: 44 sec.   Goal status: New   ASSESSMENT:  CLINICAL IMPRESSION:  Pt. tolerated moist heat, manual therapy, and the bilateral UE stretches well today with reports of them feeling better following. Pt. continues to present with limited bilateral 5th digit MP, PIP, and DIP ROM. Pt. Presents with limited 4th, and 5th digit extension. Pt. continues to benefit from OT services to work on improving BUE functioning in order to increase engagement of the UEs during ADLs, and IADL tasks.    PERFORMANCE DEFICITS: in functional skills including ADLs, IADLs, coordination, dexterity, ROM, strength, pain, Fine motor control, Gross motor control, and UE functional use, , and psychosocial skills including coping strategies, environmental adaptation, habits, interpersonal interactions, and routines and behaviors.   IMPAIRMENTS: are limiting patient from ADLs, IADLs, and leisure.   CO-MORBIDITIES: may have co-morbidities  that affects occupational performance. Patient will benefit from skilled OT to address above impairments and improve overall function.  MODIFICATION OR ASSISTANCE TO COMPLETE EVALUATION: Min-Moderate modification of tasks or assist with assess necessary to complete an evaluation.  OT OCCUPATIONAL PROFILE AND HISTORY: Detailed assessment: Review of records and additional review of physical, cognitive, psychosocial history related to current functional performance.  CLINICAL DECISION MAKING: Moderate - several treatment options, min-mod task modification necessary  REHAB POTENTIAL: Good  EVALUATION COMPLEXITY: Moderate   PLAN:  OT FREQUENCY: 2x/week  OT DURATION: 12 weeks  PLANNED INTERVENTIONS: 97168 OT Re-evaluation, 97535 self care/ADL training, 02889 therapeutic exercise, 97530 therapeutic  activity, 97112 neuromuscular re-education, 97140 manual therapy, 97018 paraffin, 02989 moist heat, 97010 cryotherapy, 97034 contrast bath, 97760 Orthotic Initial, 97763 Orthotic/Prosthetic subsequent, passive range of motion, balance training, functional mobility training, energy conservation, coping strategies training, patient/family education, and DME and/or AE instructions  RECOMMENDED OTHER SERVICES: PT for balance  CONSULTED AND AGREED WITH PLAN OF CARE: Patient  PLAN FOR NEXT SESSION:  Assess sensation, and hand function skills at the next visit, as well administer the MAM-20 outcome measure; treatment  Richardson Otter, MS, OTR/L  09/19/2024, 9:53 PM           "

## 2024-09-26 ENCOUNTER — Ambulatory Visit: Attending: Family Medicine | Admitting: Occupational Therapy

## 2024-09-26 ENCOUNTER — Ambulatory Visit: Admitting: Physical Therapy

## 2024-09-26 DIAGNOSIS — R262 Difficulty in walking, not elsewhere classified: Secondary | ICD-10-CM | POA: Diagnosis present

## 2024-09-26 DIAGNOSIS — M6281 Muscle weakness (generalized): Secondary | ICD-10-CM | POA: Insufficient documentation

## 2024-09-26 DIAGNOSIS — R278 Other lack of coordination: Secondary | ICD-10-CM | POA: Diagnosis present

## 2024-09-26 DIAGNOSIS — R2681 Unsteadiness on feet: Secondary | ICD-10-CM | POA: Insufficient documentation

## 2024-09-26 NOTE — Therapy (Signed)
 " Occupational Therapy Treatment Note   Patient Name: Maria Lowe MRN: [id number] DOB:1931/05/18, 89 y.o., female Today's Date: 09/26/2024   REFERRING PROVIDER: Katrinka Senior, MD  END OF SESSION:   OT End of Session - 09/26/24 1225     Visit Number 28    Number of Visits 48    Date for Recertification  10/30/24    OT Start Time 1020    OT Stop Time 1100    OT Time Calculation (min) 40 min    Activity Tolerance Patient tolerated treatment well    Behavior During Therapy WFL for tasks assessed/performed            Past Medical History:  Diagnosis Date   Anticoagulant long-term use    eliquis -- managed by cardiology   Arthritis    knees   CKD (chronic kidney disease), stage III (HCC)    CLL (chronic lymphocytic leukemia) (HCC) 11/27/2013   oncologist-- dr lonn;  initial dx 03/ 2015, no treatment, active survillance   Essential hypertension    followed by pcp   Full dentures    History of gastric ulcer    stomach ulcer when young   Lymphocytosis 2009   followed oncology   Permanent atrial fibrillation (HCC) 11/2020   cardiologist-- primary dr w. barbaraann and followed by atrial clinic;    dx 003/ 2022, atenolol  for rate control and on eliquis    Rectocele    Thrombocythemia 2009   related to CLL, followed by dr lonn   Urinary incontinence    Vaginal atrophy    Past Surgical History:  Procedure Laterality Date   APPENDECTOMY     1980s   CATARACT EXTRACTION W/ INTRAOCULAR LENS IMPLANT Bilateral 2020   RECTOCELE REPAIR N/A 08/25/2021   Procedure: POSTERIOR REPAIR (RECTOCELE);  Surgeon: Jertson, Jill Evelyn, MD;  Location: [facility];  Service: Gynecology;  Laterality: N/A;   TONSILLECTOMY AND ADENOIDECTOMY     age 77   Patient Active Problem List   Diagnosis Date Noted   Severe mitral regurgitation 05/23/2024   Deficiency anemia 04/21/2024   Dehydration 04/21/2024   Acute left-sided low back pain with left-sided sciatica 06/25/2022    H/O rectocele repair 08/25/2021   Secondary hypercoagulable state 12/10/2020   Persistent atrial fibrillation (HCC) 11/29/2020   Hyperlipidemia, unspecified 04/07/2018   CKD (chronic kidney disease), stage III (HCC) 02/09/2018   Rash 02/09/2018   Seborrheic dermatitis 01/08/2017   Former smoker 09/30/2015   Rectocele 03/29/2015   Thrombocytopenia 11/26/2014   CLL (chronic lymphocytic leukemia) (HCC) 11/27/2013   Hyperglycemia 05/08/2013   Essential hypertension, benign 11/07/2012   Obesity, unspecified 11/07/2012    ONSET DATE: 01/2024  REFERRING DIAG: Right hand weakness  THERAPY DIAG:  Muscle weakness (generalized)  Other lack of coordination  Rationale for Evaluation and Treatment: Rehabilitation  SUBJECTIVE:   SUBJECTIVE STATEMENT:  Pt. reports having had a quiet  new year. Accompanied by self; with daughter in the reception area  PERTINENT HISTORY:  Pt. was referred for outpatient OT services for right hand pain. Pt. had received a cat bite approximately one week prior to her going on a trip to [place]. She was hospitalized in [place] for one month due to developing a Cat Bite Pasturella Infection. While in the hospital, Pt. reports that she was placed in an arm cast. Pt. reports that while she was in the hospital she was bedbound, was not allowed to get out of bed for the entire month, and became weak, and  deconditioned. Pt. Reports that she does not know why she was placed in a cast. [place] return [place] the [place] Pt. started home health OT and PT services, however OT services were limited due to staffing constraints. Pt. was then referred to outpatient OT services. PMHx includes: Severe mitral valve regurgitation, pericadrial, and pleural effusions, A-Fib, Chronic Lymphocytic Anemia, multifactorial anemia and CKD stage III, Diverticulosis, constipation, HTN, Hyperlipidemia, Prediabetes.  PRECAUTIONS: None  WEIGHT BEARING RESTRICTIONS: No  PAIN:  Are you having pain?  No  pain  FALLS: Has patient fallen in last 6 months? 1 fall in the last 6 months  LIVING ENVIRONMENT: Lives with: Resides with her daughter Lives in: House- one level Stairs: ramped entrance Has following equipment at home: Counselling psychologist, Environmental Consultant - 4 wheeled, shower chair, bed side commode, and Grab bars, hospital bed, elevated toilet  PLOF: Independent, living alone, managing a household-per report  PATIENT GOALS:  To be able to cut her meat  OBJECTIVE:  Note: Objective measures were completed at Evaluation unless otherwise noted.  HAND DOMINANCE: Right  ADLs:  Transfers/ambulation related to ADLs: Supervision with a rollator walker Eating: Pt. has difficulty with using utensils with the right hand, often bringing her head down to meet hand/utensil when eating. Pt. Has  weakness with bringing the cup to her mouth. Difficulty with cutting food. Grooming: Difficulty sustaining the BUE's in up long enough to perform hair care. Independent with oral care,  UB Dressing: Independent, requires increased time to complete. Pt. Does endorse having difficulty reaching up to put a shirt on over her head. LB Dressing:Reports being able to perform LE dressing, Uses slide-on footwear.  Toileting:Pt. Reports independently being able to perform toileting care needs using her left hand. (Of note, Pt. Reports that she has always used her left hand to perform toilet hygiene care.) Bathing: Independent, daughter provides supervision from outside the shower curtain. Daughter assists with drying her back due to limited reach Tub Shower transfers: Daughter provides supervision tub/shower t/f's   IADLs: Light housekeeping: Pt. reports that she has resumed some light housekeeping tasks, however this has been limited. Pt. Reports that she does not vacuum, or sweep due to rollator use. Meal Prep: Pt. Has resumed weekly cooking on Sundays. Reports having difficulty cutting onions, as well as difficulty  holding and using a beater. Pt. Reports that she uses a modified technique to hold kitchen/cooking utensils. Community mobility: Pt. Relies on family  and friends for transportation Medication management: Pt. Has difficulty manipulating medication efficiently with the right hand Handwriting: 50% legible for name only Leisure/Hobbies: Loves to cook; enjoys reading , and working crossword puzzles.  MOBILITY STATUS:  Supervision using a rollator  FUNCTIONAL OUTCOME MEASURES: 05/30/24: MAM-20: 11/20 items were completed. The following items Pt. indicated as a 2 -very hard to do: wringing out a towel, opening a wide mouth bottle-previously opened, cutting meat on a plate, tying shoe laces, zipping a jacket, and squeezing toothpaste on a toothbrush. The following items Pt. indicated as a 1-cannot do: opening a medication bottle with a childproof cap, cutting nails with a clipper, buttoning clothing, picking up a 1/2 full water pitcher, and writing 3-4 lines legibly.  UPPER EXTREMITY ROM:     ROM Right eval Right 06/27/24 Right 08/07/24 Left eval Left 06/27/24 Left 08/07/24  Shoulder flexion 130(150) Scaption 134(150) 144(150)  100 scaption 100 scaption (120) flexion  Shoulder abduction 82(120) 109(120) 118(120) 72(102) 892(887) 107(118)  Shoulder adduction  Shoulder extension        Shoulder internal rotation        Shoulder external rotation        Elbow flexion [facility] WFL 148 WFL WFL  Elbow extension [facility] WFL  -60(-28) -35(-18) -26(-13)  Wrist flexion 26(60) 30(60) 36(60) WFL WFL 60(70)  Wrist extension 44(62) 46(62) 52(62) WFL WFL 70  Wrist ulnar deviation        Wrist radial deviation        Wrist pronation        Wrist supination        (Blank rows = not tested)  Eval: Digit flexion to the [facility]:    Right: 2nd: 5cm(2cm) 3rd: 0cm  4th: 0cm 5th: 5cm(3cm)     Left: 2nd: 1cm(1cm) 3rd: 2cm(0cm) 4th: 4cm(0cm) 5th: 5cm(3cm)    06/27/24:  Digit flexion to the [facility]:    Right:  2nd: 4.5cm(2cm) 3rd: 2.5cm(0cm)  4th: 2cm (0cm) 5th: 5cm(2cm)     Left: 2nd: 1cm(1cm) 3rd: 1.37m(1cm) 4th: 1.5cm(1cm) 5th: 5cm(1.5cm)   08/07/24:  Digit flexion to the [facility]:    Right: 2nd: 3.5cm(1.5cm) 3rd: 0cm(0cm)  4th: 1.5cm (0cm) 5th: 5cm(1.5cm)     Left: 2nd: 0cm(0cm) 3rd: o)m(0cm) 4th: 0cm(0cm) 5th: 4.5cm(1.5cm)  UPPER EXTREMITY MMT:     MMT Right eval Right  06/27/24 Right 08/07/24 Left eval Left 06/27/24 Left 08/07/24  Shoulder flexion  3+/5 4-/5  3-/5 3-/5  Shoulder abduction 3-/5 3+/5 3+/5 3-/5 3+/5 3+/5  Shoulder adduction        Shoulder extension        Shoulder internal rotation        Shoulder external rotation        Middle trapezius        Lower trapezius        Elbow flexion 4/5 4/5 4/5 4/5 4/5 4/5  Elbow extension 4/5 4/5 4/5 2+/5 3-/5 3-/5  Wrist flexion 3-/5 3-/5 3-/5 4-/5 4-/5 4-/5  Wrist extension 3-/5 3/5 3+/5 4-/5 4-/5 4-/5  Wrist ulnar deviation        Wrist radial deviation        Wrist pronation        Wrist supination        (Blank rows = not tested)  HAND FUNCTION: 05/30/24: Grip strength: Right: 8 lbs; Left: 12 lbs, Lateral pinch: Right: 4 lbs, Left: 3 lbs, and 3 point pinch: Right: 3 lbs, Left: 3 lbs  06/27/24: Grip strength: Right: 11 lbs; Left: 12 lbs, Lateral pinch: Right: 4 lbs, Left: 3 lbs, and 3 point pinch: Right: 3 lbs, Left: 3 lbs  08/07/24: Grip strength: Right: 15 lbs; Left: 18 lbs, Lateral pinch: Right: 10 lbs, Left: 9 lbs, and 3 point pinch: Right: 5 lbs, Left: 3 lbs   COORDINATION:  COORDINATION: Eval:   9 Hole Peg test: Right: 48 sec; Left: 49 sec  06/27/24:  9 Hole Peg test: Right: 33 sec; Left: 49 sec  08/07/24:   9 Hole Peg test: Right: 31 sec; Left: 44 sec.  SENSATION: TBD   EDEMA: N/A    COGNITION: Overall cognitive status: Within functional limits for tasks assessed  VISION: Subjective report: No change from baseline  PERCEPTION: [facility]  TREATMENT DATE: 09/26/24    Therapeutic Ex.:    -Performed AROM, followed by PROM to the end range for bilateral shoulder flexion, abduction, external rotation, horizontal abduction. Emphasis was placed on slow prolonged gentle stretching 2/2 stiffness and soreness. -Pt. tolerated bilateral AROM/AAROM/PROM for right bilateral  2nd, and 5th digit MP, PIP, and DIP flexion to prepare for formulating a composite fist. -Performed bilateral alternating UE vertical dowle climbs at the tabletop surface. -Performed BUE strengthening using a 1# dowel ex. 2/2 to weakness. Bilateral shoulder flexion for 1 set 5-10 reps each with cues, and support proximally increased support required on the left.  Therapeutic Activities:   -Pt. worked on functional reaching with the Right, and left UEs grasping, and manipulating 1/2 washers from a magnetic dish using point grasp pattern. Pt. worked on reaching up, stabilizing, and sustaining shoulder elevation while placing the washer over a small precise target on vertical dowels positioned at various angles.     PATIENT EDUCATION: Education details: digit ROM, UE strengthening Person educated: Patient Education method: Explanation, Demonstration, Tactile cues, and Verbal cues Education comprehension: verbalized understanding, returned demonstration, verbal cues required, tactile cues required, and needs further education  HOME EXERCISE PROGRAM:    -Bilateral elbow flexion, and extension with yellow theraband.  -Yellow theraputty strengthening  following a HEP through [employer]. -contrasting heat 3 min., followed by 1 min. Cold  for 1 min. Ending with heat for 3 min. To the right hand. (Pt. Prefers just heat at home), moist heat to the left hand. -Massage to the right hand  -Right hand digit extension with the hand flat at the tabletop surface -Blue resistive foam block for Bilateral gross gripping, lateral pinch, and  3pt. Pinch strength -UE shoulder stabilization in supine with the shoulder flexed to 90 degrees with the elbow extended-formulating the alphabet    GOALS: Goals reviewed with patient? Yes  SHORT TERM GOALS: Target date: 07/07/2024     Pt. Will be independent with HEPs for UE strength, and coordination Baseline: 06/27/24: Independent; continue Eval: No current HEP Goal status: Ongoing   LONG TERM GOALS: Target date: 08/18/2024   To reduce pain by 3 grades on the pain scale in preparation for functional hand use during ADLs/IADLs Baseline: 06/27/24: 0/10 pain in the right hand. Pt. reports discomfort intermittently with left 5th digit flexion. Eval:  5/10 pain in the right hand Goal status: Progressed; Achieved  2.  Pt. Will formulate a full composite fist in preparation for securely holding kitchen/cooking utensils. Baseline:08/07/24: Digit flexion to the [facility]: Right: 2nd: 3.5cm(1.5cm) 3rd: 0cm(0cm)  4th: 1.5cm (0cm) 5th: 5cm(1.5cm) Left: 2nd: 0cm(0cm) 3rd: o)m(0cm) 4th: 0cm(0cm) 5th: 4.5cm(1.5cm) 06/27/24: Digit flexion to the [facility]: Right: 2nd: 4.5cm(2cm) 3rd: 2.5cm(0cm)  4th: 2cm (0cm) 5th: 5cm(2cm) Left: 2nd: 1cm(1cm) 3rd: 1.37m(1cm) 4th: 1.5cm(1cm) 5th: 5cm(1.5cm) Pt. Had difficulty with holding and using utensils during self-feeding, Pt. Is improving with larger cooking utensilsEval: Digit flexion to the [facility]:Mphyu: 2nd: 5cm(2cm) 3rd: 0cm  4th: 0cm 5th: 5cm(3cm) Left: 2nd: 1cm(1cm) 3rd: 2cm(0cm) 4th: 4cm(0cm) 5th: 5cm(3cm) Pt, has difficulty securely holding cooking utensils in her right hand Goal status: Progressing, Ongoing  3.  Pt. Will efficiently perform hand to mouth patterns during self-feeding with modified independence using her right hand. Baseline: 08/07/24: Pt. continues to have difficulty bringing her spoon all the way to her mouth, continues to need to bring her mouth down to her spoon.06/27/24: Pt. continues to have difficulty bringing her spoon all the way to her mouth,  often needing  to bring her mouth down to her spoon. Eval: Pt. Has difficulty completing hand to mouth patterns, often bringing her mouth down to meet the spoon 2/2 weakness in the RUE/hand Goal status: Progressing, Ongoing  4.  Pt. Will cut meat on her plate efficiently with modified independence Baseline: 08/07/24: Pt. reports that she is now able to cut meat. 06/27/24: Pt. Is improving with cutting meat, over continues to have difficulty cutting meat. Eval: Pt. Has difficulty cutting meat Goal status: Achieved  5.  Pt. Will increase bilateral shoulder abduction by 10 degrees be able to efficiently perform hair care Baseline: 08/07/24:  Shoulder abduction: Right: 118(120), Left: 107(112) Pt. is now able to brush reach up further to the back of her head.06/27/24: Shoulder abduction: Right: 109(120), Left: 107(112) Pt. is able to reach up further to her head, however has difficulty sustaining BUE in elevation while performing hair care. Eval: shoulder abduction: Right: 82(120), Left: 72(102) Goal status: Progressing  6.  Pt. Will write one sentence efficiently  with 75% in preparation for being able to fill in crossword puzzles. Baseline: 08/07/24: Continue 06/27/24: Name only with 90% legibility; one sentence completed with 50% legibility Eval: writing legibility: 75% for name only. Goal status: INITIAL  7. Pt. Will improve left elbow extension to be able to efficiently reach out for items on the table.    Baseline: Elbow extension: -26(-13)    Goal status: New    8. Pt. Will improve BUE strength to assist with ADLs, and IADL tasks  Baseline: 08/07/24: UE strength: Right Shoulder flexion: 4-/5, abduction: 3+/5, elbow flexion: 4/5, extension: 4/5, wrist flexion: 3-/5, extension: 3+/5; Left Shoulder flexion: 3-/5, abduction: 3+/5, elbow flexion: 4/5, extension: 3-/5, wrist flexion: 4-/5, extension: 4-/5  Goal status: New  9. Pt. will improve bilateral grip strength by 5# to be able to hold items  more securely for ADLs/IADLs  Baseline: 08/07/24: Grip strength: Right: 15# Left: 18#  Goal status: New  10.  Pt. Will improve bilateral [facility] skills in order to be able to manipulate small objects  Baseline: 08/07/24: 9 Hole Peg test: Right: 31 sec; Left: 44 sec.   Goal status: New   ASSESSMENT:  CLINICAL IMPRESSION:  Pt. continues to present with limited bilateral digit flexion in preparation for fisting. Pt. continues to present with limited strength in the LUE with reaching, and is only able to perform a  couple of reps at a time prior to requiring a short rest break before continuing. Pt. presents with difficulty performing translatory movements with more than 2 items in her hand at one time. Pt. continues to benefit from OT services to work on improving BUE functioning in order to increase engagement of the UEs during ADLs, and IADL tasks.    PERFORMANCE DEFICITS: in functional skills including ADLs, IADLs, coordination, dexterity, ROM, strength, pain, Fine motor control, Gross motor control, and UE functional use, , and psychosocial skills including coping strategies, environmental adaptation, habits, interpersonal interactions, and routines and behaviors.   IMPAIRMENTS: are limiting patient from ADLs, IADLs, and leisure.   CO-MORBIDITIES: may have co-morbidities  that affects occupational performance. Patient will benefit from skilled OT to address above impairments and improve overall function.  MODIFICATION OR ASSISTANCE TO COMPLETE EVALUATION: Min-Moderate modification of tasks or assist with assess necessary to complete an evaluation.  OT OCCUPATIONAL PROFILE AND HISTORY: Detailed assessment: Review of records and additional review of physical, cognitive, psychosocial history related to current functional performance.  CLINICAL DECISION MAKING: Moderate - several  treatment options, min-mod task modification necessary  REHAB POTENTIAL: Good  EVALUATION COMPLEXITY:  Moderate   PLAN:  OT FREQUENCY: 2x/week  OT DURATION: 12 weeks  PLANNED INTERVENTIONS: 97168 OT Re-evaluation, 97535 self care/ADL training, 02889 therapeutic exercise, 97530 therapeutic activity, 97112 neuromuscular re-education, 97140 manual therapy, 97018 paraffin, 02989 moist heat, 97010 cryotherapy, 97034 contrast bath, 97760 Orthotic Initial, 97763 Orthotic/Prosthetic subsequent, passive range of motion, balance training, functional mobility training, energy conservation, coping strategies training, patient/family education, and DME and/or AE instructions  RECOMMENDED OTHER SERVICES: PT for balance  CONSULTED AND AGREED WITH PLAN OF CARE: Patient  PLAN FOR NEXT SESSION:  Assess sensation, and hand function skills at the next visit, as well administer the MAM-20 outcome measure; treatment  Richardson Otter, MS, OTR/L  09/26/2024, 12:25 PM           "

## 2024-09-26 NOTE — Therapy (Signed)
 "  OUTPATIENT PHYSICAL THERAPY NEURO TREATMENT   Patient Name: Maria Lowe MRN: [id number] DOB:1931/04/01, 89 y.o., female Today's Date: 09/26/2024   PCP: Garnette KIDD. Katrinka, MD REFERRING PROVIDER: Garnette KIDD. Katrinka, MD  END OF SESSION:  PT End of Session - 09/26/24 1106     Visit Number 9    Number of Visits 24    Date for Recertification  10/25/24    Progress Note Due on Visit 10    PT Start Time 1102    PT Stop Time 1143    PT Time Calculation (min) 41 min    Equipment Utilized During Treatment Gait belt    Activity Tolerance Patient tolerated treatment well    Behavior During Therapy WFL for tasks assessed/performed           Past Medical History:  Diagnosis Date   Anticoagulant long-term use    eliquis -- managed by cardiology   Arthritis    knees   CKD (chronic kidney disease), stage III (HCC)    CLL (chronic lymphocytic leukemia) (HCC) 11/27/2013   oncologist-- dr lonn;  initial dx 03/ 2015, no treatment, active survillance   Essential hypertension    followed by pcp   Full dentures    History of gastric ulcer    stomach ulcer when young   Lymphocytosis 2009   followed oncology   Permanent atrial fibrillation (HCC) 11/2020   cardiologist-- primary dr w. barbaraann and followed by atrial clinic;    dx 003/ 2022, atenolol  for rate control and on eliquis    Rectocele    Thrombocythemia 2009   related to CLL, followed by dr lonn   Urinary incontinence    Vaginal atrophy    Past Surgical History:  Procedure Laterality Date   APPENDECTOMY     1980s   CATARACT EXTRACTION W/ INTRAOCULAR LENS IMPLANT Bilateral 2020   RECTOCELE REPAIR N/A 08/25/2021   Procedure: POSTERIOR REPAIR (RECTOCELE);  Surgeon: Jertson, Jill Evelyn, MD;  Location: [facility];  Service: Gynecology;  Laterality: N/A;   TONSILLECTOMY AND ADENOIDECTOMY     age 28   Patient Active Problem List   Diagnosis Date Noted   Severe mitral regurgitation 05/23/2024   Deficiency  anemia 04/21/2024   Dehydration 04/21/2024   Acute left-sided low back pain with left-sided sciatica 06/25/2022   H/O rectocele repair 08/25/2021   Secondary hypercoagulable state 12/10/2020   Persistent atrial fibrillation (HCC) 11/29/2020   Hyperlipidemia, unspecified 04/07/2018   CKD (chronic kidney disease), stage III (HCC) 02/09/2018   Rash 02/09/2018   Seborrheic dermatitis 01/08/2017   Former smoker 09/30/2015   Rectocele 03/29/2015   Thrombocytopenia 11/26/2014   CLL (chronic lymphocytic leukemia) (HCC) 11/27/2013   Hyperglycemia 05/08/2013   Essential hypertension, benign 11/07/2012   Obesity, unspecified 11/07/2012    ONSET DATE: May 2025  REFERRING DIAG: Severe muscle deconditioning  THERAPY DIAG:  Muscle weakness (generalized)  Unsteadiness on feet  Difficulty in walking, not elsewhere classified  Rationale for Evaluation and Treatment: Rehabilitation  SUBJECTIVE:  SUBJECTIVE STATEMENT:  Patient reports using mostly her cane at home. No changes since last changes of note.   Pt accompanied by: family member (daughter)  PERTINENT HISTORY:  Pt was referred to outpatient PT for muscle deconditioning. Pt experienced a cat bite that required hospitalization while she was in [place]. Daughter states she was quite ill and was on bed rest for 3 weeks, which she states contributed to her decline in function. Prior to hospitalization patient was ambulating with a cane and independent with all activities. Currently she ambulates with a rollator, but would like to work toward ambulating with a cane. Pt enjoys reading and cooking. Her goals are to ambulated with LRAD, navigate stairs with confidence, and increase tolerance for cooking.   PAIN:  Are you having pain? Occasional arthritic pain,  primarily right knee  PRECAUTIONS: None  RED FLAGS: None   WEIGHT BEARING RESTRICTIONS: No  FALLS: Has patient fallen in last 6 months? Yes. Number of falls 3   LIVING ENVIRONMENT: Lives with: lives with their daughter Lives in: House/apartment Stairs: Yes; two steps at daughters house/ currently ramped Has following equipment at home: Counselling psychologist, Environmental Consultant - 4 wheeled, shower chair, Grab bars, and hospital bed  PLOF: Independent with basic ADLs   PATIENT GOALS: Pt would like to work on getting in and out of chairs, increasing tolerance for cooking, and using LRAD.    OBJECTIVE:  Note: Objective measures were completed at Evaluation unless otherwise noted.   COGNITION: Overall cognitive status: Within functional limits for tasks assessed    LOWER EXTREMITY ROM:     Active  Right Eval Left Eval  Hip flexion    Hip extension    Hip abduction    Hip adduction    Hip internal rotation    Hip external rotation    Knee flexion    Knee extension    Ankle dorsiflexion    Ankle plantarflexion    Ankle inversion    Ankle eversion     (Blank rows = not tested)  LOWER EXTREMITY MMT:    MMT Right Eval Left Eval  Hip flexion 4+ 4+  Hip extension    Hip abduction 3+ 3+  Hip adduction 3+ 3+  Hip internal rotation    Hip external rotation    Knee flexion 4+ 4+  Knee extension 4+ 4+  Ankle dorsiflexion 4 4  Ankle plantarflexion 3+ 3+  Ankle inversion    Ankle eversion    (Blank rows = not tested)  BED MOBILITY:  Pt reports no difficulty with bed mobility, but utilizes a hospital bed  RAMP:  Pt reports unsteadiness with ramps  STAIRS: To be assessed at next visit GAIT: Findings: Distance walked: 616ft, Assistive device utilized:Walker - 4 wheeled  FUNCTIONAL TESTS:  5 times sit to stand: 16.95 sec completed with UE support Timed up and go (TUG): 23.59 sec completed w/ 5TT 10 meter walk test: 16.34 sec; 0.61 m/s completed w/ 5TT 6 minute walk test:  completed 5:07 min, 612 ft completed w/ 5TT Item Test date: 08/07/24  Sitting to standing 2. able to stand using hands after several tries  2. Standing unsupported 4. able to stand safely for 2 minutes  3. Sitting with back unsupported, feet supported 4. able to sit safely and securely for 2 minutes  4. Standing to sitting 4. sits safely with minimal use of hands  5. Pivot transfer  3. able to transfer safely with definite need of hands  6.  Standing unsupported with eyes closed 3. able to stand 10 seconds with supervision  7. Standing unsupported with feet together 1. needs help to attain position but able to stand 15 seconds feet together  8. Reaching forward with outstretched arms while standing 2. can reach forward 5 cm (2 inches)  9. Pick up object from the floor from standing 1. unable to pick up and needs supervision while trying  10. Turning to look behind over left and right shoulders while standing 1. needs supervision when turning  11. Turn 360 degrees 1. needs close supervision or verbal cuing  12. Place alternate foot on step or stool while standing unsupported 0. needs assistance to keep from falling/unable to try  13. Standing unsupported one foot in front 0. loses balance while stepping or standing  14. Standing on one leg 0. unable to try of needs assist to prevent fall    Total Score 26/56     PATIENT SURVEYS:  ABC scale: 37.5%                                                                                                                              TREATMENT DATE: 09/26/2024   TherAct: To improve functional movements patterns for everyday tasks  Nustep B UE and LE reciprocal movement training seat 7 - consideration for R knee lack of flexion ROM. L 1 x 6 min   Gait 2 x 400 ft with SPC ( from clinic gym to doors to medical arts, set - Short reciprocal steps- able to converse and no LOB today  Sit to stands 2 x 10  -VC to lean forward but keep looking ahead rather than  down  Fwd/retro walking with 3# AW x 25 feet and back x 3- VC for increased  step length.    NMR: To facilitate reeducation of movement, balance, posture, coordination, and/or proprioception/kinesthetic sense.   1LE on airex other on step 3 x 30 sec ea- high difficulty  Unless otherwise stated, CGA was provided and gait belt donned in order to ensure pt safety   Pt required occasional rest breaks due fatigue, PT was attentive to when pt appeared to be tired or winded in order to prevent excessive fatigue.   PATIENT EDUCATION: Education details: POC Person educated: Patient Education method: Explanation Education comprehension: verbalized understanding   HOME EXERCISE PROGRAM: Establish visit 2     GOALS: Goals reviewed with patient? Yes  SHORT TERM GOALS: Target date: 09/13/2024  Patient will be independent with HEP to improve strength and mobility for better functional independence with ADL's Baseline: Establish at second visit Goal status: INITIAL   LONG TERM GOALS: Target date: 10/25/2024  Patient will improve 5xSTS to 12 seconds or less to demonstrate improved strength and balance. Baseline: 16.95 sec with UE support Goal status: INITIAL  2.  Patient will decrease TUG score by 8 seconds or more with LRAD to indicate decreased fall risk and improved transfer  ability. Baseline:  23.59 sec completed with 4WW Goal status: INITIAL  3.  Patient will improve by >1.0 m/s with LRAD for improved community ambulation and indicate a decreased fall risk. Baseline: 0.61 m/s completed with 4WW Goal status: INITIAL  4.  Patient will improve to >800 ft with LRAD for progression toward community ambulator.  Baseline: 5:07 min, 612 ft. Completed with 4WW Goal status: INITIAL  5.  Patient will improve BERG by 6 points or more to demonstrate decreased fall risk with functional activities.  Baseline: To be assessed at visit 2 Goal status: INITIAL  6.  Patient will be  able to navigate 4 steps with step to pattern using UE support and supervision in order to enter daughter's home.  Baseline: to be assessed at visit 2 Goal status: INITIAL   ASSESSMENT:  CLINICAL IMPRESSION: Treatment continues to focus on dynamic balance and overall functional endurance/walking with least restrictive assistive device.  Pt progressed with cane based gait showing improved distance but still slow gait speed. Pt challenged with postural cues during gait to improve balance and efficiency. Fearful of falling with stepping today without some form of UE support and will benefit from ongoing SLS and dynamic balance activities.  Pt will continue to benefit from skilled therapy to address remaining deficits in order to improve overall QoL and return to PLOF.    OBJECTIVE IMPAIRMENTS: Abnormal gait, decreased activity tolerance, decreased balance, decreased endurance, decreased knowledge of use of DME, and decreased strength.   ACTIVITY LIMITATIONS: carrying, lifting, squatting, stairs, transfers, and reach over head  PARTICIPATION LIMITATIONS: meal prep, cleaning, laundry, and community activity  PERSONAL FACTORS: Age and 3+ comorbidities: CKD, CLL, HTN are also affecting patient's functional outcome.   REHAB POTENTIAL: Good  CLINICAL DECISION MAKING: Evolving/moderate complexity  EVALUATION COMPLEXITY: Moderate  PLAN:  PT FREQUENCY: 2x/week  PT DURATION: 12 weeks  PLANNED INTERVENTIONS: 97110-Therapeutic exercises, 97530- Therapeutic activity, 97112- Neuromuscular re-education, 97535- Self Care, [id number]- Manual therapy, [phone]- Gait training, Patient/Family education, Balance training, Stair training, Vestibular training, and DME instructions  PLAN FOR NEXT SESSION:  Endurance and functional strength SPC training for community tasks  Balance activities- SLS   Lonni KATHEE Gainer PT  Physical Therapist- [place]  [facility]    "

## 2024-09-28 ENCOUNTER — Ambulatory Visit

## 2024-09-28 ENCOUNTER — Ambulatory Visit: Admitting: Occupational Therapy

## 2024-09-28 DIAGNOSIS — R2681 Unsteadiness on feet: Secondary | ICD-10-CM

## 2024-09-28 DIAGNOSIS — M6281 Muscle weakness (generalized): Secondary | ICD-10-CM

## 2024-09-28 DIAGNOSIS — R262 Difficulty in walking, not elsewhere classified: Secondary | ICD-10-CM

## 2024-09-28 DIAGNOSIS — R278 Other lack of coordination: Secondary | ICD-10-CM

## 2024-09-28 NOTE — Therapy (Signed)
 " Occupational Therapy Treatment Note   Patient Name: Maria Lowe MRN: [id number] DOB:1930/11/08, 89 y.o., female Today's Date: 09/28/2024   REFERRING PROVIDER: Katrinka Senior, MD  END OF SESSION:   OT End of Session - 09/28/24 1608     Visit Number 29    Number of Visits 48    Date for Recertification  10/30/24    OT Start Time 0930    OT Stop Time 1015    OT Time Calculation (min) 45 min    Activity Tolerance Patient tolerated treatment well    Behavior During Therapy WFL for tasks assessed/performed            Past Medical History:  Diagnosis Date   Anticoagulant long-term use    eliquis -- managed by cardiology   Arthritis    knees   CKD (chronic kidney disease), stage III (HCC)    CLL (chronic lymphocytic leukemia) (HCC) 11/27/2013   oncologist-- dr lonn;  initial dx 03/ 2015, no treatment, active survillance   Essential hypertension    followed by pcp   Full dentures    History of gastric ulcer    stomach ulcer when young   Lymphocytosis 2009   followed oncology   Permanent atrial fibrillation (HCC) 11/2020   cardiologist-- primary dr w. barbaraann and followed by atrial clinic;    dx 003/ 2022, atenolol  for rate control and on eliquis    Rectocele    Thrombocythemia 2009   related to CLL, followed by dr lonn   Urinary incontinence    Vaginal atrophy    Past Surgical History:  Procedure Laterality Date   APPENDECTOMY     1980s   CATARACT EXTRACTION W/ INTRAOCULAR LENS IMPLANT Bilateral 2020   RECTOCELE REPAIR N/A 08/25/2021   Procedure: POSTERIOR REPAIR (RECTOCELE);  Surgeon: Jertson, Jill Evelyn, MD;  Location: [facility];  Service: Gynecology;  Laterality: N/A;   TONSILLECTOMY AND ADENOIDECTOMY     age 70   Patient Active Problem List   Diagnosis Date Noted   Severe mitral regurgitation 05/23/2024   Deficiency anemia 04/21/2024   Dehydration 04/21/2024   Acute left-sided low back pain with left-sided sciatica 06/25/2022    H/O rectocele repair 08/25/2021   Secondary hypercoagulable state 12/10/2020   Persistent atrial fibrillation (HCC) 11/29/2020   Hyperlipidemia, unspecified 04/07/2018   CKD (chronic kidney disease), stage III (HCC) 02/09/2018   Rash 02/09/2018   Seborrheic dermatitis 01/08/2017   Former smoker 09/30/2015   Rectocele 03/29/2015   Thrombocytopenia 11/26/2014   CLL (chronic lymphocytic leukemia) (HCC) 11/27/2013   Hyperglycemia 05/08/2013   Essential hypertension, benign 11/07/2012   Obesity, unspecified 11/07/2012    ONSET DATE: 01/2024  REFERRING DIAG: Right hand weakness  THERAPY DIAG:  Muscle weakness (generalized)  Rationale for Evaluation and Treatment: Rehabilitation  SUBJECTIVE:   SUBJECTIVE STATEMENT:  Pt. reports that her hands have improved, however she still can not fully close her R/L small fingers.  Accompanied by self; with daughter in the reception area  PERTINENT HISTORY:  Pt. was referred for outpatient OT services for right hand pain. Pt. had received a cat bite approximately one week prior to her going on a trip to [place]. She was hospitalized in [place] for one month due to developing a Cat Bite Pasturella Infection. While in the hospital, Pt. reports that she was placed in an arm cast. Pt. reports that while she was in the hospital she was bedbound, was not allowed to get out of bed for the entire  month, and became weak, and deconditioned. Pt. Reports that she does not know why she was placed in a cast. [place] return [place] the [place] Pt. started home health OT and PT services, however OT services were limited due to staffing constraints. Pt. was then referred to outpatient OT services. PMHx includes: Severe mitral valve regurgitation, pericadrial, and pleural effusions, A-Fib, Chronic Lymphocytic Anemia, multifactorial anemia and CKD stage III, Diverticulosis, constipation, HTN, Hyperlipidemia, Prediabetes.  PRECAUTIONS: None  WEIGHT BEARING RESTRICTIONS: No  PAIN:   Are you having pain?  No pain  FALLS: Has patient fallen in last 6 months? 1 fall in the last 6 months  LIVING ENVIRONMENT: Lives with: Resides with her daughter Lives in: House- one level Stairs: ramped entrance Has following equipment at home: Counselling psychologist, Environmental Consultant - 4 wheeled, shower chair, bed side commode, and Grab bars, hospital bed, elevated toilet  PLOF: Independent, living alone, managing a household-per report  PATIENT GOALS:  To be able to cut her meat  OBJECTIVE:  Note: Objective measures were completed at Evaluation unless otherwise noted.  HAND DOMINANCE: Right  ADLs:  Transfers/ambulation related to ADLs: Supervision with a rollator walker Eating: Pt. has difficulty with using utensils with the right hand, often bringing her head down to meet hand/utensil when eating. Pt. Has  weakness with bringing the cup to her mouth. Difficulty with cutting food. Grooming: Difficulty sustaining the BUE's in up long enough to perform hair care. Independent with oral care,  UB Dressing: Independent, requires increased time to complete. Pt. Does endorse having difficulty reaching up to put a shirt on over her head. LB Dressing:Reports being able to perform LE dressing, Uses slide-on footwear.  Toileting:Pt. Reports independently being able to perform toileting care needs using her left hand. (Of note, Pt. Reports that she has always used her left hand to perform toilet hygiene care.) Bathing: Independent, daughter provides supervision from outside the shower curtain. Daughter assists with drying her back due to limited reach Tub Shower transfers: Daughter provides supervision tub/shower t/f's   IADLs: Light housekeeping: Pt. reports that she has resumed some light housekeeping tasks, however this has been limited. Pt. Reports that she does not vacuum, or sweep due to rollator use. Meal Prep: Pt. Has resumed weekly cooking on Sundays. Reports having difficulty cutting onions,  as well as difficulty holding and using a beater. Pt. Reports that she uses a modified technique to hold kitchen/cooking utensils. Community mobility: Pt. Relies on family  and friends for transportation Medication management: Pt. Has difficulty manipulating medication efficiently with the right hand Handwriting: 50% legible for name only Leisure/Hobbies: Loves to cook; enjoys reading , and working crossword puzzles.  MOBILITY STATUS:  Supervision using a rollator  FUNCTIONAL OUTCOME MEASURES: 05/30/24: MAM-20: 11/20 items were completed. The following items Pt. indicated as a 2 -very hard to do: wringing out a towel, opening a wide mouth bottle-previously opened, cutting meat on a plate, tying shoe laces, zipping a jacket, and squeezing toothpaste on a toothbrush. The following items Pt. indicated as a 1-cannot do: opening a medication bottle with a childproof cap, cutting nails with a clipper, buttoning clothing, picking up a 1/2 full water pitcher, and writing 3-4 lines legibly.  UPPER EXTREMITY ROM:     ROM Right eval Right 06/27/24 Right 08/07/24 Left eval Left 06/27/24 Left 08/07/24  Shoulder flexion 130(150) Scaption 134(150) 144(150)  100 scaption 100 scaption (120) flexion  Shoulder abduction 82(120) 109(120) 118(120) 72(102) 892(887) 107(118)  Shoulder adduction  Shoulder extension        Shoulder internal rotation        Shoulder external rotation        Elbow flexion [facility] WFL 148 WFL WFL  Elbow extension [facility] WFL  -60(-28) -35(-18) -26(-13)  Wrist flexion 26(60) 30(60) 36(60) WFL WFL 60(70)  Wrist extension 44(62) 46(62) 52(62) WFL WFL 70  Wrist ulnar deviation        Wrist radial deviation        Wrist pronation        Wrist supination        (Blank rows = not tested)  Eval: Digit flexion to the [facility]:    Right: 2nd: 5cm(2cm) 3rd: 0cm  4th: 0cm 5th: 5cm(3cm)     Left: 2nd: 1cm(1cm) 3rd: 2cm(0cm) 4th: 4cm(0cm) 5th: 5cm(3cm)    06/27/24:  Digit flexion to  the [facility]:    Right: 2nd: 4.5cm(2cm) 3rd: 2.5cm(0cm)  4th: 2cm (0cm) 5th: 5cm(2cm)     Left: 2nd: 1cm(1cm) 3rd: 1.39m(1cm) 4th: 1.5cm(1cm) 5th: 5cm(1.5cm)   08/07/24:  Digit flexion to the [facility]:    Right: 2nd: 3.5cm(1.5cm) 3rd: 0cm(0cm)  4th: 1.5cm (0cm) 5th: 5cm(1.5cm)     Left: 2nd: 0cm(0cm) 3rd: o)m(0cm) 4th: 0cm(0cm) 5th: 4.5cm(1.5cm)  UPPER EXTREMITY MMT:     MMT Right eval Right  06/27/24 Right 08/07/24 Left eval Left 06/27/24 Left 08/07/24  Shoulder flexion  3+/5 4-/5  3-/5 3-/5  Shoulder abduction 3-/5 3+/5 3+/5 3-/5 3+/5 3+/5  Shoulder adduction        Shoulder extension        Shoulder internal rotation        Shoulder external rotation        Middle trapezius        Lower trapezius        Elbow flexion 4/5 4/5 4/5 4/5 4/5 4/5  Elbow extension 4/5 4/5 4/5 2+/5 3-/5 3-/5  Wrist flexion 3-/5 3-/5 3-/5 4-/5 4-/5 4-/5  Wrist extension 3-/5 3/5 3+/5 4-/5 4-/5 4-/5  Wrist ulnar deviation        Wrist radial deviation        Wrist pronation        Wrist supination        (Blank rows = not tested)  HAND FUNCTION: 05/30/24: Grip strength: Right: 8 lbs; Left: 12 lbs, Lateral pinch: Right: 4 lbs, Left: 3 lbs, and 3 point pinch: Right: 3 lbs, Left: 3 lbs  06/27/24: Grip strength: Right: 11 lbs; Left: 12 lbs, Lateral pinch: Right: 4 lbs, Left: 3 lbs, and 3 point pinch: Right: 3 lbs, Left: 3 lbs  08/07/24: Grip strength: Right: 15 lbs; Left: 18 lbs, Lateral pinch: Right: 10 lbs, Left: 9 lbs, and 3 point pinch: Right: 5 lbs, Left: 3 lbs   COORDINATION:  COORDINATION: Eval:   9 Hole Peg test: Right: 48 sec; Left: 49 sec  06/27/24:  9 Hole Peg test: Right: 33 sec; Left: 49 sec  08/07/24:   9 Hole Peg test: Right: 31 sec; Left: 44 sec.  SENSATION: TBD   EDEMA: N/A    COGNITION: Overall cognitive status: Within functional limits for tasks assessed  VISION: Subjective report: No change from baseline  PERCEPTION: [facility]  TREATMENT DATE: 09/28/24    Moist heat modality was provided.  Manual Therapy:    -Pt. tolerated STM to the volar surface of the bilateral hands, palm, thenar eminence, hypothenar eminence, the volar, dorsal, lateral and medial aspect of the digits, the thumb MCP/IP joints, and the 1st dorsal interossei in conjuntion with moist heat to increase circulation, and decrease pain 2/2 stiffness -Manual therapy was performed independent of, and in preparation for therapeutic Ex.     Therapeutic Ex.:    -Performed AROM, followed by PROM to the end range for bilateral shoulder flexion, abduction, external rotation, horizontal abduction. Emphasis was placed on slow prolonged gentle stretching 2/2 stiffness and soreness. -Pt. tolerated bilateral AROM/AAROM/PROM for right bilateral  2nd, and 5th digit MP, PIP, and DIP flexion to prepare for formulating a composite fist. -Performed bilateral alternating UE vertical dowle climbs at the tabletop surface. -Performed BUE strengthening using a 1# dowel ex. 2/2 to weakness. Bilateral shoulder flexion for 1 set 5-10 reps each with cues, and support proximally increased support required on the left. -Pt. worked on bilateral digit flexion using a 1.5# DigiFlex  PATIENT EDUCATION: Education details: digit ROM, UE strengthening Person educated: Patient Education method: Explanation, Demonstration, Tactile cues, and Verbal cues Education comprehension: verbalized understanding, returned demonstration, verbal cues required, tactile cues required, and needs further education  HOME EXERCISE PROGRAM:    -Bilateral elbow flexion, and extension with yellow theraband.  -Yellow theraputty strengthening  following a HEP through [employer]. -contrasting heat 3 min., followed by 1 min. Cold  for 1 min. Ending with heat for 3 min. To the right hand. (Pt. Prefers just heat at home), moist heat to the left  hand. -Massage to the right hand  -Right hand digit extension with the hand flat at the tabletop surface -Blue resistive foam block for Bilateral gross gripping, lateral pinch, and 3pt. Pinch strength -UE shoulder stabilization in supine with the shoulder flexed to 90 degrees with the elbow extended-formulating the alphabet    GOALS: Goals reviewed with patient? Yes  SHORT TERM GOALS: Target date: 07/07/2024     Pt. Will be independent with HEPs for UE strength, and coordination Baseline: 06/27/24: Independent; continue Eval: No current HEP Goal status: Ongoing   LONG TERM GOALS: Target date: 08/18/2024   To reduce pain by 3 grades on the pain scale in preparation for functional hand use during ADLs/IADLs Baseline: 06/27/24: 0/10 pain in the right hand. Pt. reports discomfort intermittently with left 5th digit flexion. Eval:  5/10 pain in the right hand Goal status: Progressed; Achieved  2.  Pt. Will formulate a full composite fist in preparation for securely holding kitchen/cooking utensils. Baseline:08/07/24: Digit flexion to the [facility]: Right: 2nd: 3.5cm(1.5cm) 3rd: 0cm(0cm)  4th: 1.5cm (0cm) 5th: 5cm(1.5cm) Left: 2nd: 0cm(0cm) 3rd: o)m(0cm) 4th: 0cm(0cm) 5th: 4.5cm(1.5cm) 06/27/24: Digit flexion to the [facility]: Right: 2nd: 4.5cm(2cm) 3rd: 2.5cm(0cm)  4th: 2cm (0cm) 5th: 5cm(2cm) Left: 2nd: 1cm(1cm) 3rd: 1.45m(1cm) 4th: 1.5cm(1cm) 5th: 5cm(1.5cm) Pt. Had difficulty with holding and using utensils during self-feeding, Pt. Is improving with larger cooking utensilsEval: Digit flexion to the [facility]: 2nd: 5cm(2cm) 3rd: 0cm  4th: 0cm 5th: 5cm(3cm) Left: 2nd: 1cm(1cm) 3rd: 2cm(0cm) 4th: 4cm(0cm) 5th: 5cm(3cm) Pt, has difficulty securely holding cooking utensils in her right hand Goal status: Progressing, Ongoing  3.  Pt. Will efficiently perform hand to mouth patterns during self-feeding with modified independence using her right hand. Baseline: 08/07/24: Pt. continues to have difficulty bringing  her spoon all the way to her mouth,  continues to need to bring her mouth down to her spoon.06/27/24: Pt. continues to have difficulty bringing her spoon all the way to her mouth, often needing to bring her mouth down to her spoon. Eval: Pt. Has difficulty completing hand to mouth patterns, often bringing her mouth down to meet the spoon 2/2 weakness in the RUE/hand Goal status: Progressing, Ongoing  4.  Pt. Will cut meat on her plate efficiently with modified independence Baseline: 08/07/24: Pt. reports that she is now able to cut meat. 06/27/24: Pt. Is improving with cutting meat, over continues to have difficulty cutting meat. Eval: Pt. Has difficulty cutting meat Goal status: Achieved  5.  Pt. Will increase bilateral shoulder abduction by 10 degrees be able to efficiently perform hair care Baseline: 08/07/24:  Shoulder abduction: Right: 118(120), Left: 107(112) Pt. is now able to brush reach up further to the back of her head.06/27/24: Shoulder abduction: Right: 109(120), Left: 107(112) Pt. is able to reach up further to her head, however has difficulty sustaining BUE in elevation while performing hair care. Eval: shoulder abduction: Right: 82(120), Left: 72(102) Goal status: Progressing  6.  Pt. Will write one sentence efficiently  with 75% in preparation for being able to fill in crossword puzzles. Baseline: 08/07/24: Continue 06/27/24: Name only with 90% legibility; one sentence completed with 50% legibility Eval: writing legibility: 75% for name only. Goal status: INITIAL  7. Pt. Will improve left elbow extension to be able to efficiently reach out for items on the table.    Baseline: Elbow extension: -26(-13)    Goal status: New    8. Pt. Will improve BUE strength to assist with ADLs, and IADL tasks  Baseline: 08/07/24: UE strength: Right Shoulder flexion: 4-/5, abduction: 3+/5, elbow flexion: 4/5, extension: 4/5, wrist flexion: 3-/5, extension: 3+/5; Left Shoulder flexion: 3-/5,  abduction: 3+/5, elbow flexion: 4/5, extension: 3-/5, wrist flexion: 4-/5, extension: 4-/5  Goal status: New  9. Pt. will improve bilateral grip strength by 5# to be able to hold items more securely for ADLs/IADLs  Baseline: 08/07/24: Grip strength: Right: 15# Left: 18#  Goal status: New  10.  Pt. Will improve bilateral [facility] skills in order to be able to manipulate small objects  Baseline: 08/07/24: 9 Hole Peg test: Right: 31 sec; Left: 44 sec.   Goal status: New   ASSESSMENT:  CLINICAL IMPRESSION:  Pt. continues to present with limited bilateral 5th digit flexion hindering her ability to perform a composite fist. Pt. responded well to moist heat, manual therapy, and stretches well. Pt. presented with difficulty performing bilateral 5th digit flexion using the DigiFlex, however was able to apply slightly more pressure with the 5th digits. Pt. continues to benefit from OT services to work on improving BUE functioning in order to increase engagement of the UEs during ADLs, and IADL tasks.    PERFORMANCE DEFICITS: in functional skills including ADLs, IADLs, coordination, dexterity, ROM, strength, pain, Fine motor control, Gross motor control, and UE functional use, , and psychosocial skills including coping strategies, environmental adaptation, habits, interpersonal interactions, and routines and behaviors.   IMPAIRMENTS: are limiting patient from ADLs, IADLs, and leisure.   CO-MORBIDITIES: may have co-morbidities  that affects occupational performance. Patient will benefit from skilled OT to address above impairments and improve overall function.  MODIFICATION OR ASSISTANCE TO COMPLETE EVALUATION: Min-Moderate modification of tasks or assist with assess necessary to complete an evaluation.  OT OCCUPATIONAL PROFILE AND HISTORY: Detailed assessment: Review of records and additional review of physical, cognitive,  psychosocial history related to current functional performance.  CLINICAL  DECISION MAKING: Moderate - several treatment options, min-mod task modification necessary  REHAB POTENTIAL: Good  EVALUATION COMPLEXITY: Moderate   PLAN:  OT FREQUENCY: 2x/week  OT DURATION: 12 weeks  PLANNED INTERVENTIONS: 97168 OT Re-evaluation, 97535 self care/ADL training, 02889 therapeutic exercise, 97530 therapeutic activity, 97112 neuromuscular re-education, 97140 manual therapy, 97018 paraffin, 02989 moist heat, 97010 cryotherapy, 97034 contrast bath, 97760 Orthotic Initial, 97763 Orthotic/Prosthetic subsequent, passive range of motion, balance training, functional mobility training, energy conservation, coping strategies training, patient/family education, and DME and/or AE instructions  RECOMMENDED OTHER SERVICES: PT for balance  CONSULTED AND AGREED WITH PLAN OF CARE: Patient  PLAN FOR NEXT SESSION:  Assess sensation, and hand function skills at the next visit, as well administer the MAM-20 outcome measure; treatment  Richardson Otter, MS, OTR/L  09/28/2024, 4:13 PM           "

## 2024-09-28 NOTE — Therapy (Signed)
 "  OUTPATIENT PHYSICAL THERAPY TREATMENT Physical Therapy Progress Note  Dates of reporting period  08/02/24   to   09/28/24   Patient Name: Maria Lowe MRN: [id number] DOB:1931-01-05, 89 y.o., female Today's Date: 09/28/2024   PCP: Garnette KIDD. Katrinka, MD REFERRING PROVIDER: Garnette KIDD. Katrinka, MD  END OF SESSION:  PT End of Session - 09/28/24 0853     Visit Number 10    Number of Visits 24    Date for Recertification  10/25/24    Progress Note Due on Visit 10    PT Start Time 0845    PT Stop Time 0925    PT Time Calculation (min) 40 min    Equipment Utilized During Treatment Gait belt    Activity Tolerance Patient tolerated treatment well;No increased pain    Behavior During Therapy WFL for tasks assessed/performed           Past Medical History:  Diagnosis Date   Anticoagulant long-term use    eliquis -- managed by cardiology   Arthritis    knees   CKD (chronic kidney disease), stage III (HCC)    CLL (chronic lymphocytic leukemia) (HCC) 11/27/2013   oncologist-- dr lonn;  initial dx 03/ 2015, no treatment, active survillance   Essential hypertension    followed by pcp   Full dentures    History of gastric ulcer    stomach ulcer when young   Lymphocytosis 2009   followed oncology   Permanent atrial fibrillation (HCC) 11/2020   cardiologist-- primary dr w. barbaraann and followed by atrial clinic;    dx 003/ 2022, atenolol  for rate control and on eliquis    Rectocele    Thrombocythemia 2009   related to CLL, followed by dr lonn   Urinary incontinence    Vaginal atrophy    Past Surgical History:  Procedure Laterality Date   APPENDECTOMY     1980s   CATARACT EXTRACTION W/ INTRAOCULAR LENS IMPLANT Bilateral 2020   RECTOCELE REPAIR N/A 08/25/2021   Procedure: POSTERIOR REPAIR (RECTOCELE);  Surgeon: Jertson, Jill Evelyn, MD;  Location: [facility];  Service: Gynecology;  Laterality: N/A;   TONSILLECTOMY AND ADENOIDECTOMY     age 50   Patient  Active Problem List   Diagnosis Date Noted   Severe mitral regurgitation 05/23/2024   Deficiency anemia 04/21/2024   Dehydration 04/21/2024   Acute left-sided low back pain with left-sided sciatica 06/25/2022   H/O rectocele repair 08/25/2021   Secondary hypercoagulable state 12/10/2020   Persistent atrial fibrillation (HCC) 11/29/2020   Hyperlipidemia, unspecified 04/07/2018   CKD (chronic kidney disease), stage III (HCC) 02/09/2018   Rash 02/09/2018   Seborrheic dermatitis 01/08/2017   Former smoker 09/30/2015   Rectocele 03/29/2015   Thrombocytopenia 11/26/2014   CLL (chronic lymphocytic leukemia) (HCC) 11/27/2013   Hyperglycemia 05/08/2013   Essential hypertension, benign 11/07/2012   Obesity, unspecified 11/07/2012    ONSET DATE: May 2025  REFERRING DIAG: Severe muscle deconditioning  THERAPY DIAG:  Muscle weakness (generalized)  Unsteadiness on feet  Difficulty in walking, not elsewhere classified  Other lack of coordination  Rationale for Evaluation and Treatment: Rehabilitation  SUBJECTIVE:  SUBJECTIVE STATEMENT: Pt says she is growing tired of having to come to therapy so much, but she is willing to continue through end of cert. She is happy to hear of progress made so far.   Pt accompanied by: family member (daughter)  PERTINENT HISTORY:  Pt was referred to outpatient PT for muscle deconditioning. Pt experienced a cat bite that required hospitalization while she was in [place]. Daughter states she was quite ill and was on bed rest for 3 weeks, which she states contributed to her decline in function. Prior to hospitalization patient was ambulating with a cane and independent with all activities. Currently she ambulates with a rollator, but would like to work toward ambulating  with a cane. Pt enjoys reading and cooking. Her goals are to ambulated with LRAD, navigate stairs with confidence, and increase tolerance for cooking.   PAIN:  Are you having pain? No  PRECAUTIONS: None  WEIGHT BEARING RESTRICTIONS: No  FALLS: Has patient fallen in last 6 months? Yes. Number of falls 3   LIVING ENVIRONMENT: Lives with: lives with their daughter Lives in: House/apartment Stairs: Yes; two steps at daughters house/ currently ramped Has following equipment at home: Counselling psychologist, Environmental Consultant - 4 wheeled, shower chair, Grab bars, and hospital bed  PLOF: Independent with basic ADLs   PATIENT GOALS: Pt would like to work on getting in and out of chairs, increasing tolerance for cooking, and using LRAD.    OBJECTIVE:  Note: Objective measures were completed at Evaluation unless otherwise noted.   COGNITION: Overall cognitive status: Within functional limits for tasks assessed  LOWER EXTREMITY MMT:    MMT Right Eval Left Eval Right  1/8 Left  1/8  Hip flexion 4+ 4+ 5/5 5/5  Horizontal Hip abduction 3+ 3+ 5/5 5/5  Horizontal Hip adduction 3+ 3+ 5/5 5/5  Hip internal rotation   4+/5 5/5  Hip external rotation   4/5* 5/5  Knee flexion 4+ 4+ 5/5 5/5  Knee extension 4+ 4+ 5/5 5/5  Ankle dorsiflexion 4 4 5/5 5/5  Ankle plantarflexion 3+ 3+    *pain in knee (chronic DJD, edema)   GAIT: Findings: Distance walked: 655ft, Assistive device utilized:Walker - 4 wheeled; 09/28/24: 919ft c 4WW, stops 3x to catch breath all <5sec  FUNCTIONAL TESTS:  5 times sit to stand: 16.95 sec completed with UE support; 09/28/24: 18.09sec hands of knees or hands free Timed up and go (TUG): 23.59 sec completed w/ 5TT; retest pending 10 meter walk test: 16.34 sec; 0.61 m/s completed w/ 5TT; 09/28/24: 0.74m/s 6 minute walk test: completed 5:07 min, 612 ft completed w/ 5TT; 09/28/24: 94ft c 4WW, stops 3x to catch breath all <5sec  Item Test date: 08/07/24  Sitting to standing 2. able to  stand using hands after several tries  2. Standing unsupported 4. able to stand safely for 2 minutes  3. Sitting with back unsupported, feet supported 4. able to sit safely and securely for 2 minutes  4. Standing to sitting 4. sits safely with minimal use of hands  5. Pivot transfer  3. able to transfer safely with definite need of hands  6. Standing unsupported with eyes closed 3. able to stand 10 seconds with supervision  7. Standing unsupported with feet together 1. needs help to attain position but able to stand 15 seconds feet together  8. Reaching forward with outstretched arms while standing 2. can reach forward 5 cm (2 inches)  9. Pick up object from the floor from  standing 1. unable to pick up and needs supervision while trying  10. Turning to look behind over left and right shoulders while standing 1. needs supervision when turning  11. Turn 360 degrees 1. needs close supervision or verbal cuing  12. Place alternate foot on step or stool while standing unsupported 0. needs assistance to keep from falling/unable to try  13. Standing unsupported one foot in front 0. loses balance while stepping or standing  14. Standing on one leg 0. unable to try of needs assist to prevent fall    Total Score 26/56   PATIENT SURVEYS:  ABC scale: 37.5%; 09/28/24: 63.7% (pt requested to not do this survey again, questions are not meaningful.                                                                                                                              TREATMENT DATE: 09/28/2024  ABC SCALE: 63.7%  5xSTS: 18.09sec hands of knees or hands free MMT:  : 99ft c 4WW, stops 3x to catch breath all <5sec    PATIENT EDUCATION: Education details: POC Person educated: Patient Education method: Explanation Education comprehension: verbalized understanding   HOME EXERCISE PROGRAM: None    GOALS: Goals reviewed with patient? Yes  SHORT TERM GOALS: Target date: 09/13/2024  Patient will  be independent with HEP to improve strength and mobility for better functional independence with ADL's Baseline:Not established Goal status: NOT MET  LONG TERM GOALS: Target date: 10/25/2024  Patient will improve 5xSTS to 12 seconds or less to demonstrate improved strength and balance. Baseline: 16.95 sec with UE support; 18.09sec hands of knees or hands free Goal status: PROGRESSING  2.  Patient will decrease TUG score by 8 seconds or more with LRAD to indicate decreased fall risk and improved transfer ability. Baseline:  23.59 sec completed with 5TT;  Goal status: PROGRESSING  3.  Patient will improve by >1.0 m/s with LRAD for improved community ambulation and indicate a decreased fall risk. Baseline: 0.61 m/s completed with 5TT; 0.62m/s   Goal status: PROGRESSING  4.  Patient will improve to >800 ft with LRAD for progression toward community ambulator.  Baseline: 5:07 min, 612 ft. Completed with 4WW; 952ft c 4WW, stops 3x to catch breath all <5sec Goal status: PROGRESSING  5.  Patient will improve BERG by 6 points or more to demonstrate decreased fall risk with functional activities.  Baseline: 26/56; 09/28/24: pending retest  Goal status: INITIAL  6.  Patient will be able to navigate 4 steps with step to pattern using UE support and supervision in order to enter daughter's home.  Baseline: to be assessed at visit 2; 09/28/24: pt reports success in accessing pt's home.  Goal status: MET   ASSESSMENT:  CLINICAL IMPRESSION: Reassessment today. Pt has made excellent progress overall with , MMT leg strength, 5xSTS (no longer requiring UE support). ABC scale shows progress but questions are not meaningful to patient and  survey is a poor tool choice for patient, will likely DC survey after today. Pt will continue to benefit from skilled therapy to address remaining deficits in order to improve overall QoL and return to PLOF.    OBJECTIVE IMPAIRMENTS: Abnormal gait, decreased  activity tolerance, decreased balance, decreased endurance, decreased knowledge of use of DME, and decreased strength.   ACTIVITY LIMITATIONS: carrying, lifting, squatting, stairs, transfers, and reach over head  PARTICIPATION LIMITATIONS: meal prep, cleaning, laundry, and community activity  PERSONAL FACTORS: Age and 3+ comorbidities: CKD, CLL, HTN are also affecting patient's functional outcome.   REHAB POTENTIAL: Good  CLINICAL DECISION MAKING: Evolving/moderate complexity  EVALUATION COMPLEXITY: Moderate  PLAN:  PT FREQUENCY: 2x/week  PT DURATION: 12 weeks  PLANNED INTERVENTIONS: 97110-Therapeutic exercises, 97530- Therapeutic activity, 97112- Neuromuscular re-education, 97535- Self Care, [id number]- Manual therapy, [phone]- Gait training, Patient/Family education, Balance training, Stair training, Vestibular training, and DME instructions  PLAN FOR NEXT SESSION:  Endurance and functional strength SPC training for community tasks  Balance activities- SLS   8:58 AM, 09/28/2024 Peggye JAYSON Linear, PT, DPT Physical Therapist - [facility] [place]  Outpatient Physical Therapy- Main Campus [phone]      "

## 2024-10-04 ENCOUNTER — Ambulatory Visit: Admitting: Occupational Therapy

## 2024-10-04 ENCOUNTER — Ambulatory Visit

## 2024-10-04 DIAGNOSIS — R278 Other lack of coordination: Secondary | ICD-10-CM

## 2024-10-04 DIAGNOSIS — R262 Difficulty in walking, not elsewhere classified: Secondary | ICD-10-CM

## 2024-10-04 DIAGNOSIS — M6281 Muscle weakness (generalized): Secondary | ICD-10-CM

## 2024-10-04 DIAGNOSIS — R2681 Unsteadiness on feet: Secondary | ICD-10-CM

## 2024-10-04 NOTE — Therapy (Signed)
 " Occupational Therapy Progress Note  Dates of reporting period  08/07/25   to   10/04/24    Patient Name: Maria Lowe MRN: [id number] DOB:06-30-1931, 89 y.o., female Today's Date: 10/04/2024   REFERRING PROVIDER: Katrinka Senior, MD  END OF SESSION:   OT End of Session - 10/04/24 1739     Visit Number 30    Number of Visits 48    Date for Recertification  10/30/24    OT Start Time 0933    OT Stop Time 1015    OT Time Calculation (min) 42 min    Activity Tolerance Patient tolerated treatment well    Behavior During Therapy [facility] for tasks assessed/performed             Past Medical History:  Diagnosis Date   Anticoagulant long-term use    eliquis -- managed by cardiology   Arthritis    knees   CKD (chronic kidney disease), stage III (HCC)    CLL (chronic lymphocytic leukemia) (HCC) 11/27/2013   oncologist-- dr lonn;  initial dx 03/ 2015, no treatment, active survillance   Essential hypertension    followed by pcp   Full dentures    History of gastric ulcer    stomach ulcer when young   Lymphocytosis 2009   followed oncology   Permanent atrial fibrillation (HCC) 11/2020   cardiologist-- primary dr w. barbaraann and followed by atrial clinic;    dx 003/ 2022, atenolol  for rate control and on eliquis    Rectocele    Thrombocythemia 2009   related to CLL, followed by dr lonn   Urinary incontinence    Vaginal atrophy    Past Surgical History:  Procedure Laterality Date   APPENDECTOMY     1980s   CATARACT EXTRACTION W/ INTRAOCULAR LENS IMPLANT Bilateral 2020   RECTOCELE REPAIR N/A 08/25/2021   Procedure: POSTERIOR REPAIR (RECTOCELE);  Surgeon: Jertson, Jill Evelyn, MD;  Location: [facility];  Service: Gynecology;  Laterality: N/A;   TONSILLECTOMY AND ADENOIDECTOMY     age 30   Patient Active Problem List   Diagnosis Date Noted   Severe mitral regurgitation 05/23/2024   Deficiency anemia 04/21/2024   Dehydration 04/21/2024   Acute  left-sided low back pain with left-sided sciatica 06/25/2022   H/O rectocele repair 08/25/2021   Secondary hypercoagulable state 12/10/2020   Persistent atrial fibrillation (HCC) 11/29/2020   Hyperlipidemia, unspecified 04/07/2018   CKD (chronic kidney disease), stage III (HCC) 02/09/2018   Rash 02/09/2018   Seborrheic dermatitis 01/08/2017   Former smoker 09/30/2015   Rectocele 03/29/2015   Thrombocytopenia 11/26/2014   CLL (chronic lymphocytic leukemia) (HCC) 11/27/2013   Hyperglycemia 05/08/2013   Essential hypertension, benign 11/07/2012   Obesity, unspecified 11/07/2012    ONSET DATE: 01/2024  REFERRING DIAG: Right hand weakness  THERAPY DIAG:  Muscle weakness (generalized)  Rationale for Evaluation and Treatment: Rehabilitation  SUBJECTIVE:   SUBJECTIVE STATEMENT:  Pt. reports that she still has difficulty using her right hand to feeding herself. Accompanied by self; with daughter in the reception area  PERTINENT HISTORY:  Pt. was referred for outpatient OT services for right hand pain. Pt. had received a cat bite approximately one week prior to her going on a trip to [place]. She was hospitalized in [place] for one month due to developing a Cat Bite Pasturella Infection. While in the hospital, Pt. reports that she was placed in an arm cast. Pt. reports that while she was in the hospital she was bedbound, was  not allowed to get out of bed for the entire month, and became weak, and deconditioned. Pt. Reports that she does not know why she was placed in a cast. [place] the [place] Pt. started home health OT and PT services, however OT services were limited due to staffing constraints. Pt. was then referred to outpatient OT services. PMHx includes: Severe mitral valve regurgitation, pericadrial, and pleural effusions, A-Fib, Chronic Lymphocytic Anemia, multifactorial anemia and CKD stage III, Diverticulosis, constipation, HTN, Hyperlipidemia, Prediabetes.  PRECAUTIONS:  None  WEIGHT BEARING RESTRICTIONS: No  PAIN:  Are you having pain?  No pain  FALLS: Has patient fallen in last 6 months? 1 fall in the last 6 months  LIVING ENVIRONMENT: Lives with: Resides with her daughter Lives in: House- one level Stairs: ramped entrance Has following equipment at home: Counselling psychologist, Environmental Consultant - 4 wheeled, shower chair, bed side commode, and Grab bars, hospital bed, elevated toilet  PLOF: Independent, living alone, managing a household-per report  PATIENT GOALS:  To be able to cut her meat  OBJECTIVE:  Note: Objective measures were completed at Evaluation unless otherwise noted.  HAND DOMINANCE: Right  ADLs:  Transfers/ambulation related to ADLs: Supervision with a rollator walker Eating: Pt. has difficulty with using utensils with the right hand, often bringing her head down to meet hand/utensil when eating. Pt. Has  weakness with bringing the cup to her mouth. Difficulty with cutting food. Grooming: Difficulty sustaining the BUE's in up long enough to perform hair care. Independent with oral care,  UB Dressing: Independent, requires increased time to complete. Pt. Does endorse having difficulty reaching up to put a shirt on over her head. LB Dressing:Reports being able to perform LE dressing, Uses slide-on footwear.  Toileting:Pt. Reports independently being able to perform toileting care needs using her left hand. (Of note, Pt. Reports that she has always used her left hand to perform toilet hygiene care.) Bathing: Independent, daughter provides supervision from outside the shower curtain. Daughter assists with drying her back due to limited reach Tub Shower transfers: Daughter provides supervision tub/shower t/f's   IADLs: Light housekeeping: Pt. reports that she has resumed some light housekeeping tasks, however this has been limited. Pt. Reports that she does not vacuum, or sweep due to rollator use. Meal Prep: Pt. Has resumed weekly cooking on  Sundays. Reports having difficulty cutting onions, as well as difficulty holding and using a beater. Pt. Reports that she uses a modified technique to hold kitchen/cooking utensils. Community mobility: Pt. Relies on family  and friends for transportation Medication management: Pt. Has difficulty manipulating medication efficiently with the right hand Handwriting: 50% legible for name only Leisure/Hobbies: Loves to cook; enjoys reading , and working crossword puzzles.  MOBILITY STATUS:  Supervision using a rollator  FUNCTIONAL OUTCOME MEASURES: 05/30/24: MAM-20: 11/20 items were completed. The following items Pt. indicated as a 2 -very hard to do: wringing out a towel, opening a wide mouth bottle-previously opened, cutting meat on a plate, tying shoe laces, zipping a jacket, and squeezing toothpaste on a toothbrush. The following items Pt. indicated as a 1-cannot do: opening a medication bottle with a childproof cap, cutting nails with a clipper, buttoning clothing, picking up a 1/2 full water pitcher, and writing 3-4 lines legibly.  UPPER EXTREMITY ROM:     ROM Right eval Right 06/27/24 Right 08/07/24 Right 10/04/24 Left eval Left 06/27/24 Left 08/07/24 Left 10/04/24  Shoulder flexion 130(150) Scaption 134(150) 144(150) 147(150)  100 scaption 100 scaption (120)  flexion 100 scaption (124) flexion  Shoulder abduction 82(120) 890(879) 881(879) 130(132) 72(102) 892(887) 892(881) 891(875)  Shoulder adduction          Shoulder extension          Shoulder internal rotation          Shoulder external rotation          Elbow flexion [facility] WFL 148 WFL WFL WFL  Elbow extension WFL WFL   -60(-28) -35(-18) -26(-13) -22(-13)  Wrist flexion 26(60) 30(60) 36(60) 40(60) WFL WFL 60(70) 70  Wrist extension 44(62) 46(62) 52(62) 64(64) WFL WFL 70 70  Wrist ulnar deviation          Wrist radial deviation          Wrist pronation          Wrist supination          (Blank rows = not  tested)  Eval: Digit flexion to the [facility]:    Right: 2nd: 5cm(2cm) 3rd: 0cm  4th: 0cm 5th: 5cm(3cm)     Left: 2nd: 1cm(1cm) 3rd: 2cm(0cm) 4th: 4cm(0cm) 5th: 5cm(3cm)    06/27/24:  Digit flexion to the [facility]:    Right: 2nd: 4.5cm(2cm) 3rd: 2.5cm(0cm)  4th: 2cm (0cm) 5th: 5cm(2cm)     Left: 2nd: 1cm(1cm) 3rd: 1.68m(1cm) 4th: 1.5cm(1cm) 5th: 5cm(1.5cm)   08/07/24:  Digit flexion to the [facility]:    Right: 2nd: 3.5cm(1.5cm) 3rd: 0cm(0cm)  4th: 1.5cm (0cm) 5th: 5cm(1.5cm)     Left: 2nd: 0cm(0cm) 3rd: o)m(0cm) 4th: 0cm(0cm) 5th: 4.5cm(1.5cm)  10/05/23  Digit flexion to the [facility]:    Right: 2nd: 3.0cm(1.0cm) 3rd: 0cm(0cm)  4th: 1.5cm (0cm) 5th: 5cm(1 cm)     Left: 2nd: 0cm(0cm) 3rd: o)m(0cm) 4th: 0cm(0cm) 5th: 4.5cm(2.5cm)  UPPER EXTREMITY MMT:     MMT Right eval Right  06/27/24 Right 08/07/24 Right 10/04/24 Left eval Left 06/27/24 Left 08/07/24 Left 10/04/24  Shoulder flexion  3+/5 4-/5 4/5  3-/5 3-/5 3-/5  Shoulder abduction 3-/5 3+/5 3+/5 3+/5 3-/5 3+/5 3+/5 4-/5  Shoulder adduction          Shoulder extension          Shoulder internal rotation          Shoulder external rotation          Middle trapezius          Lower trapezius          Elbow flexion 4/5 4/5 4/5 4+/5 4/5 4/5 4/5 4+/5  Elbow extension 4/5 4/5 4/5 4/5 2+/5 3-/5 3-/5 3-/5  Wrist flexion 3-/5 3-/5 3-/5 3-/5 4-/5 4-/5 4-/5 4+/5  Wrist extension 3-/5 3/5 3+/5 3+/5 4-/5 4-/5 4-/5 4/5  Wrist ulnar deviation          Wrist radial deviation          Wrist pronation          Wrist supination          (Blank rows = not tested)  HAND FUNCTION: 05/30/24: Grip strength: Right: 8 lbs; Left: 12 lbs, Lateral pinch: Right: 4 lbs, Left: 3 lbs, and 3 point pinch: Right: 3 lbs, Left: 3 lbs  06/27/24: Grip strength: Right: 11 lbs; Left: 12 lbs, Lateral pinch: Right: 4 lbs, Left: 3 lbs, and 3 point pinch: Right: 3 lbs, Left: 3 lbs  08/07/24: Grip strength: Right: 15 lbs; Left: 18 lbs, Lateral pinch: Right: 10 lbs, Left: 9 lbs, and  3 point pinch: Right: 5 lbs, Left: 3 lbs  10/04/24: Grip strength: Right: 19 lbs; Left: 19 lbs, Lateral pinch: Right: 10 lbs, Left: 5 lbs, and 3 point pinch: Right: 6 lbs, Left: 5 lbs     COORDINATION:  COORDINATION: Eval:   9 Hole Peg test: Right: 48 sec; Left: 49 sec  06/27/24:  9 Hole Peg test: Right: 33 sec; Left: 49 sec  08/07/24:   9 Hole Peg test: Right: 31 sec; Left: 44 sec.  10/04/24  9 Hole Peg test: Right: 37 sec; Left: 34 sec.  SENSATION: TBD   EDEMA: N/A    COGNITION: Overall cognitive status: Within functional limits for tasks assessed  VISION: Subjective report: No change from baseline   PERCEPTION: WFL                                                                                                                TREATMENT DATE: 10/04/24    Measurements were obtained, and goals were reviewed with the Pt.    PATIENT EDUCATION: Education details: digit ROM, UE strengthening Person educated: Patient Education method: Explanation, Demonstration, Tactile cues, and Verbal cues Education comprehension: verbalized understanding, returned demonstration, verbal cues required, tactile cues required, and needs further education  HOME EXERCISE PROGRAM:    -Bilateral elbow flexion, and extension with yellow theraband.  -Yellow theraputty strengthening  following a HEP through [employer]. -contrasting heat 3 min., followed by 1 min. Cold  for 1 min. Ending with heat for 3 min. To the right hand. (Pt. Prefers just heat at home), moist heat to the left hand. -Massage to the right hand  -Right hand digit extension with the hand flat at the tabletop surface -Blue resistive foam block for Bilateral gross gripping, lateral pinch, and 3pt. Pinch strength -UE shoulder stabilization in supine with the shoulder flexed to 90 degrees with the elbow extended-formulating the alphabet    GOALS: Goals reviewed with patient? Yes  SHORT TERM GOALS: Target date: 07/07/2024      Pt. Will be independent with HEPs for UE strength, and coordination Baseline: 10/04/24: Independent; continue/ongoing 06/27/24: Independent; continue Eval: No current HEP Goal status: Ongoing   LONG TERM GOALS: Target date: 08/18/2024   To reduce pain by 3 grades on the pain scale in preparation for functional hand use during ADLs/IADLs Baseline: 06/27/24: 0/10 pain in the right hand. Pt. reports discomfort intermittently with left 5th digit flexion. Eval:  5/10 pain in the right hand Goal status: Progressed; Achieved  2.  Pt. Will formulate a full composite fist in preparation for securely holding kitchen/cooking utensils. Baseline:10/04/24: Digit flexion to the [facility]: Right: 2nd: 3.0cm(1.0cm) 3rd: 0cm(0cm)  4th: 1.5cm (0cm) 5th: 5cm(1 cm) Left: 2nd: 0cm(0cm) 3rd: o)m(0cm) 4th: 0cm(0cm) 5th: 4.5cm(cm) 08/07/24: Digit flexion to the [facility]: Right: 2nd: 3.5cm(1.5cm) 3rd: 0cm(0cm)  4th: 1.5cm (0cm) 5th: 5cm(1.5cm) Left: 2nd: 0cm(0cm) 3rd: o)m(0cm) 4th: 0cm(0cm) 5th: 4.5cm(1.5cm) 06/27/24: Digit flexion to the [facility]: Right: 2nd: 4.5cm(2cm) 3rd: 2.5cm(0cm)  4th: 2cm (0cm) 5th: 5cm(2cm) Left: 2nd: 1cm(1cm) 3rd: 1.32m(1cm) 4th: 1.5cm(1cm) 5th: 5cm(1.5cm) Pt. Had difficulty with holding and  using utensils during self-feeding, Pt. Is improving with larger cooking utensilsEval: Digit flexion to the [facility]:Mphyu: 2nd: 5cm(2cm) 3rd: 0cm  4th: 0cm 5th: 5cm(3cm) Left: 2nd: 1cm(1cm) 3rd: 2cm(0cm) 4th: 4cm(0cm) 5th: 5cm(3cm) Pt, has difficulty securely holding cooking utensils in her right hand Goal status: Progressing, Ongoing  3.  Pt. Will efficiently perform hand to mouth patterns during self-feeding with modified independence using her right hand. Baseline: 10/04/24: Pt. Is able to hold and use a knife with her right hand, however  continues to have difficulty bringing her spoon all the way to her mouth, continues to need to bring her mouth down to her spoon.08/07/24: Pt. continues to have difficulty bringing her spoon  all the way to her mouth, continues to need to bring her mouth down to her spoon.06/27/24: Pt. continues to have difficulty bringing her spoon all the way to her mouth, often needing to bring her mouth down to her spoon. Eval: Pt. Has difficulty completing hand to mouth patterns, often bringing her mouth down to meet the spoon 2/2 weakness in the RUE/hand Goal status: Progressing, Ongoing  4.  Pt. Will cut meat on her plate efficiently with modified independence Baseline: 08/07/24: Pt. reports that she is now able to cut meat. 06/27/24: Pt. Is improving with cutting meat, over continues to have difficulty cutting meat. Eval: Pt. Has difficulty cutting meat Goal status: Achieved  5.  Pt. Will increase bilateral shoulder abduction by 10 degrees be able to efficiently perform hair care Baseline:  10/04/24:  Shoulder abduction: Right: 130(132), Left: 891(875) 08/07/24:  Shoulder abduction: Right: 118(120), Left: 107(112) Pt. is now able to brush reach up further to the back of her head.06/27/24: Shoulder abduction: Right: 109(120), Left: 107(112) Pt. is able to reach up further to her head, however has difficulty sustaining BUE in elevation while performing hair care. Eval: shoulder abduction: Right: 82(120), Left: 72(102) Goal status: Progressing  6.  Pt. Will write one sentence efficiently  with 75% in preparation for being able to fill in crossword puzzles. Baseline: 10/04/24: Limited legibility formulating sentences.08/07/24: Continue 06/27/24: Name only with 90% legibility; one sentence completed with 50% legibility Eval: writing legibility: 75% for name only. Goal status: Progressing/Ongoing  7. Pt. Will improve left elbow extension to be able to efficiently reach out for items on the table.    Baseline: 10/04/24: Elbow extension: -22(-13) 08/07/25: Elbow extension: -26(-13)    Goal status: Progressing/ongoing    8. Pt. Will improve BUE strength to assist with ADLs, and IADL tasks  Baseline:  10/04/24: UE strength: Right Shoulder flexion: 4/5, abduction: 3+/5, elbow flexion: 4+/5, extension: 4/5, wrist flexion: 3-/5, extension: 3+/5; Left Shoulder flexion: 3-/5, abduction: 4-/5, elbow flexion: 4+/5, extension: 3-/5, wrist flexion: 4+/5, extension: 4/5 08/07/24: UE strength: Right Shoulder flexion: 4-/5, abduction: 3+/5, elbow flexion: 4/5, extension: 4/5, wrist flexion: 3-/5, extension: 3+/5; Left Shoulder flexion: 3-/5, abduction: 3+/5, elbow flexion: 4/5, extension: 3-/5, wrist flexion: 4-/5, extension: 4-/5  Goal status: progressing/Ongoing  9. Pt. will improve bilateral grip strength by 5# to be able to hold items more securely for ADLs/IADLs  Baseline: 10/04/24: Grip strength: Right: 19 lbs; Left: 19 lbs 08/07/24: Grip strength: Right: 15# Left: 18#  Goal status: Progressing/Ongoing  10.  Pt. Will improve bilateral [facility] skills in order to be able to manipulate small objects  Baseline: 10/04/24: 9 Hole Peg test: Right: 37 sec; Left: 34 sec.08/07/24: 9 Hole Peg test: Right: 31 sec; Left: 44 sec.   Goal status:  Progressing/Ongoing   ASSESSMENT:  CLINICAL IMPRESSION:  Measurements were obtained, and goals were reviewed with the Pt. Pt. has made progress over this progress reporting period with BUE ROM, digit flexion to the [facility] in the right hand, BUE strength, bilateral grip strength, and left hand [facility] skills. Pt. Is improving with functional reaching, however continues to fatigue after just a few repetitions with the UEs. Pt. Continues to work on improving BUE functional reaching for hair care, handling utensils for self-feeding, and cooking, manipulating small objects efficiently, and writing legibly. Pt. continues to benefit from OT services to work on improving BUE functioning in order to increase engagement of the UEs during ADLs, and IADL tasks.    PERFORMANCE DEFICITS: in functional skills including ADLs, IADLs, coordination, dexterity, ROM, strength, pain, Fine motor control,  Gross motor control, and UE functional use, , and psychosocial skills including coping strategies, environmental adaptation, habits, interpersonal interactions, and routines and behaviors.   IMPAIRMENTS: are limiting patient from ADLs, IADLs, and leisure.   CO-MORBIDITIES: may have co-morbidities  that affects occupational performance. Patient will benefit from skilled OT to address above impairments and improve overall function.  MODIFICATION OR ASSISTANCE TO COMPLETE EVALUATION: Min-Moderate modification of tasks or assist with assess necessary to complete an evaluation.  OT OCCUPATIONAL PROFILE AND HISTORY: Detailed assessment: Review of records and additional review of physical, cognitive, psychosocial history related to current functional performance.  CLINICAL DECISION MAKING: Moderate - several treatment options, min-mod task modification necessary  REHAB POTENTIAL: Good  EVALUATION COMPLEXITY: Moderate   PLAN:  OT FREQUENCY: 2x/week  OT DURATION: 12 weeks  PLANNED INTERVENTIONS: 97168 OT Re-evaluation, 97535 self care/ADL training, 02889 therapeutic exercise, 97530 therapeutic activity, 97112 neuromuscular re-education, 97140 manual therapy, 97018 paraffin, 02989 moist heat, 97010 cryotherapy, 97034 contrast bath, 97760 Orthotic Initial, 97763 Orthotic/Prosthetic subsequent, passive range of motion, balance training, functional mobility training, energy conservation, coping strategies training, patient/family education, and DME and/or AE instructions  RECOMMENDED OTHER SERVICES: PT for balance  CONSULTED AND AGREED WITH PLAN OF CARE: Patient  PLAN FOR NEXT SESSION:  Assess sensation, and hand function skills at the next visit, as well administer the MAM-20 outcome measure; treatment  Richardson Otter, MS, OTR/L  10/04/2024, 5:41 PM           "

## 2024-10-04 NOTE — Therapy (Signed)
 "  OUTPATIENT PHYSICAL THERAPY TREATMENT    Patient Name: Maria Lowe MRN: [id number] DOB:06/17/1931, 89 y.o., female Today's Date: 10/04/2024   PCP: Garnette KIDD. Katrinka, MD REFERRING PROVIDER: Garnette KIDD. Katrinka, MD  END OF SESSION:  PT End of Session - 10/04/24 0837     Visit Number 11    Number of Visits 24    Date for Recertification  10/25/24    Progress Note Due on Visit 10    PT Start Time 0846    PT Stop Time 0925    PT Time Calculation (min) 39 min    Equipment Utilized During Treatment Gait belt    Activity Tolerance Patient tolerated treatment well;No increased pain    Behavior During Therapy WFL for tasks assessed/performed           Past Medical History:  Diagnosis Date   Anticoagulant long-term use    eliquis -- managed by cardiology   Arthritis    knees   CKD (chronic kidney disease), stage III (HCC)    CLL (chronic lymphocytic leukemia) (HCC) 11/27/2013   oncologist-- dr lonn;  initial dx 03/ 2015, no treatment, active survillance   Essential hypertension    followed by pcp   Full dentures    History of gastric ulcer    stomach ulcer when young   Lymphocytosis 2009   followed oncology   Permanent atrial fibrillation (HCC) 11/2020   cardiologist-- primary dr w. barbaraann and followed by atrial clinic;    dx 003/ 2022, atenolol  for rate control and on eliquis    Rectocele    Thrombocythemia 2009   related to CLL, followed by dr lonn   Urinary incontinence    Vaginal atrophy    Past Surgical History:  Procedure Laterality Date   APPENDECTOMY     1980s   CATARACT EXTRACTION W/ INTRAOCULAR LENS IMPLANT Bilateral 2020   RECTOCELE REPAIR N/A 08/25/2021   Procedure: POSTERIOR REPAIR (RECTOCELE);  Surgeon: Jertson, Jill Evelyn, MD;  Location: [facility];  Service: Gynecology;  Laterality: N/A;   TONSILLECTOMY AND ADENOIDECTOMY     age 45   Patient Active Problem List   Diagnosis Date Noted   Severe mitral regurgitation  05/23/2024   Deficiency anemia 04/21/2024   Dehydration 04/21/2024   Acute left-sided low back pain with left-sided sciatica 06/25/2022   H/O rectocele repair 08/25/2021   Secondary hypercoagulable state 12/10/2020   Persistent atrial fibrillation (HCC) 11/29/2020   Hyperlipidemia, unspecified 04/07/2018   CKD (chronic kidney disease), stage III (HCC) 02/09/2018   Rash 02/09/2018   Seborrheic dermatitis 01/08/2017   Former smoker 09/30/2015   Rectocele 03/29/2015   Thrombocytopenia 11/26/2014   CLL (chronic lymphocytic leukemia) (HCC) 11/27/2013   Hyperglycemia 05/08/2013   Essential hypertension, benign 11/07/2012   Obesity, unspecified 11/07/2012    ONSET DATE: May 2025  REFERRING DIAG: Severe muscle deconditioning  THERAPY DIAG:  Muscle weakness (generalized)  Unsteadiness on feet  Difficulty in walking, not elsewhere classified  Other lack of coordination  Rationale for Evaluation and Treatment: Rehabilitation  SUBJECTIVE:  SUBJECTIVE STATEMENT: Pt says she is doing well today with no recent falls. Patient arrived using rollator.   Pt accompanied by: family member (daughter)  PERTINENT HISTORY:  Pt was referred to outpatient PT for muscle deconditioning. Pt experienced a cat bite that required hospitalization while she was in [place]. Daughter states she was quite ill and was on bed rest for 3 weeks, which she states contributed to her decline in function. Prior to hospitalization patient was ambulating with a cane and independent with all activities. Currently she ambulates with a rollator, but would like to work toward ambulating with a cane. Pt enjoys reading and cooking. Her goals are to ambulated with LRAD, navigate stairs with confidence, and increase tolerance for cooking.    PAIN:  Are you having pain? No  PRECAUTIONS: None  WEIGHT BEARING RESTRICTIONS: No  FALLS: Has patient fallen in last 6 months? Yes. Number of falls 3   LIVING ENVIRONMENT: Lives with: lives with their daughter Lives in: House/apartment Stairs: Yes; two steps at daughters house/ currently ramped Has following equipment at home: Counselling psychologist, Environmental Consultant - 4 wheeled, shower chair, Grab bars, and hospital bed  PLOF: Independent with basic ADLs   PATIENT GOALS: Pt would like to work on getting in and out of chairs, increasing tolerance for cooking, and using LRAD.    OBJECTIVE:  Note: Objective measures were completed at Evaluation unless otherwise noted.   COGNITION: Overall cognitive status: Within functional limits for tasks assessed  LOWER EXTREMITY MMT:    MMT Right Eval Left Eval Right  1/8 Left  1/8  Hip flexion 4+ 4+ 5/5 5/5  Horizontal Hip abduction 3+ 3+ 5/5 5/5  Horizontal Hip adduction 3+ 3+ 5/5 5/5  Hip internal rotation   4+/5 5/5  Hip external rotation   4/5* 5/5  Knee flexion 4+ 4+ 5/5 5/5  Knee extension 4+ 4+ 5/5 5/5  Ankle dorsiflexion 4 4 5/5 5/5  Ankle plantarflexion 3+ 3+    *pain in knee (chronic DJD, edema)   GAIT: Findings: Distance walked: 659ft, Assistive device utilized:Walker - 4 wheeled; 09/28/24: 989ft c 4WW, stops 3x to catch breath all <5sec  FUNCTIONAL TESTS:  5 times sit to stand: 16.95 sec completed with UE support; 09/28/24: 18.09sec hands of knees or hands free Timed up and go (TUG): 23.59 sec completed w/ 5TT; retest pending 10 meter walk test: 16.34 sec; 0.61 m/s completed w/ 5TT; 09/28/24: 0.55m/s 6 minute walk test: completed 5:07 min, 612 ft completed w/ 5TT; 09/28/24: 964ft c 4WW, stops 3x to catch breath all <5sec  Item Test date: 08/07/24  Sitting to standing 2. able to stand using hands after several tries  2. Standing unsupported 4. able to stand safely for 2 minutes  3. Sitting with back unsupported, feet supported  4. able to sit safely and securely for 2 minutes  4. Standing to sitting 4. sits safely with minimal use of hands  5. Pivot transfer  3. able to transfer safely with definite need of hands  6. Standing unsupported with eyes closed 3. able to stand 10 seconds with supervision  7. Standing unsupported with feet together 1. needs help to attain position but able to stand 15 seconds feet together  8. Reaching forward with outstretched arms while standing 2. can reach forward 5 cm (2 inches)  9. Pick up object from the floor from standing 1. unable to pick up and needs supervision while trying  10. Turning to look behind over left  and right shoulders while standing 1. needs supervision when turning  11. Turn 360 degrees 1. needs close supervision or verbal cuing  12. Place alternate foot on step or stool while standing unsupported 0. needs assistance to keep from falling/unable to try  13. Standing unsupported one foot in front 0. loses balance while stepping or standing  14. Standing on one leg 0. unable to try of needs assist to prevent fall    Total Score 26/56   PATIENT SURVEYS:  ABC scale: 37.5%; 09/28/24: 63.7% (pt requested to not do this survey again, questions are not meaningful.                                                                                                                              TREATMENT DATE: 10/04/2024  TA:   -Sit to stands: 2x10 from chair without UE use.   -Stairs: 5x with R UE assist.  -GT: Trial 1  340' with SPC and CGA for safety; Trial 2: 160'.   -NMS:   -Airex: Romberg stance 2x30''  -Airex: Normal BOS with EC: 3x30''  -Step ups onto airex at balance sation: x10  -Toe Taps on 6'' step:       PATIENT EDUCATION: Education details: POC Person educated: Patient Education method: Explanation Education comprehension: verbalized understanding   HOME EXERCISE PROGRAM: None    GOALS: Goals reviewed with patient? Yes  SHORT TERM GOALS: Target  date: 09/13/2024  Patient will be independent with HEP to improve strength and mobility for better functional independence with ADL's Baseline:Not established Goal status: NOT MET  LONG TERM GOALS: Target date: 10/25/2024  Patient will improve 5xSTS to 12 seconds or less to demonstrate improved strength and balance. Baseline: 16.95 sec with UE support; 18.09sec hands of knees or hands free Goal status: PROGRESSING  2.  Patient will decrease TUG score by 8 seconds or more with LRAD to indicate decreased fall risk and improved transfer ability. Baseline:  23.59 sec completed with 5TT;  Goal status: PROGRESSING  3.  Patient will improve by >1.0 m/s with LRAD for improved community ambulation and indicate a decreased fall risk. Baseline: 0.61 m/s completed with 5TT; 0.14m/s   Goal status: PROGRESSING  4.  Patient will improve to >800 ft with LRAD for progression toward community ambulator.  Baseline: 5:07 min, 612 ft. Completed with 4WW; 960ft c 4WW, stops 3x to catch breath all <5sec Goal status: PROGRESSING  5.  Patient will improve BERG by 6 points or more to demonstrate decreased fall risk with functional activities.  Baseline: 26/56; 09/28/24: pending retest  Goal status: INITIAL  6.  Patient will be able to navigate 4 steps with step to pattern using UE support and supervision in order to enter daughter's home.  Baseline: to be assessed at visit 2; 09/28/24: pt reports success in accessing pt's home.  Goal status: MET   ASSESSMENT:  CLINICAL IMPRESSION:.  Patient continues to stay motivated  to improve throughout treatment sessions. She continues to work on improving both her balance and ability to perform functional movements/activities at home and in community. Decreased LE strength, decreased balance, and decreased activity tolerance still noted throughout treatment session. Patient did require multiple rest breaks with gait due to [facility]. Airex and decreased BOS/visual  input performed to improve balance strategies today. Pt will continue to benefit from skilled therapy to address remaining deficits in order to improve overall QoL and return to PLOF.    OBJECTIVE IMPAIRMENTS: Abnormal gait, decreased activity tolerance, decreased balance, decreased endurance, decreased knowledge of use of DME, and decreased strength.   ACTIVITY LIMITATIONS: carrying, lifting, squatting, stairs, transfers, and reach over head  PARTICIPATION LIMITATIONS: meal prep, cleaning, laundry, and community activity  PERSONAL FACTORS: Age and 3+ comorbidities: CKD, CLL, HTN are also affecting patient's functional outcome.   REHAB POTENTIAL: Good  CLINICAL DECISION MAKING: Evolving/moderate complexity  EVALUATION COMPLEXITY: Moderate  PLAN:  PT FREQUENCY: 2x/week  PT DURATION: 12 weeks  PLANNED INTERVENTIONS: 97110-Therapeutic exercises, 97530- Therapeutic activity, 97112- Neuromuscular re-education, 97535- Self Care, 02859- Manual therapy, [phone]- Gait training, Patient/Family education, Balance training, Stair training, Vestibular training, and DME instructions  PLAN FOR NEXT SESSION:  Endurance and functional strength SPC training for community tasks  Balance activities- SLS   9:30 AM, 10/04/2024 Norman Sharps, PT, DPT Physical Therapist - [place]  [facility]  [phone]      "

## 2024-10-09 NOTE — Therapy (Signed)
 "  OUTPATIENT PHYSICAL THERAPY TREATMENT    Patient Name: Maria Lowe MRN: [id number] DOB:1931/08/07, 89 y.o., female Today's Date: 10/10/2024   PCP: Garnette KIDD. Katrinka, MD REFERRING PROVIDER: Garnette KIDD. Katrinka, MD  END OF SESSION:  PT End of Session - 10/10/24 0837     Visit Number 12    Number of Visits 24    Date for Recertification  10/25/24    Progress Note Due on Visit 10    PT Start Time 0840    PT Stop Time 0925    PT Time Calculation (min) 45 min    Equipment Utilized During Treatment Gait belt    Activity Tolerance Patient tolerated treatment well;No increased pain    Behavior During Therapy WFL for tasks assessed/performed            Past Medical History:  Diagnosis Date   Anticoagulant long-term use    eliquis -- managed by cardiology   Arthritis    knees   CKD (chronic kidney disease), stage III (HCC)    CLL (chronic lymphocytic leukemia) (HCC) 11/27/2013   oncologist-- dr lonn;  initial dx 03/ 2015, no treatment, active survillance   Essential hypertension    followed by pcp   Full dentures    History of gastric ulcer    stomach ulcer when young   Lymphocytosis 2009   followed oncology   Permanent atrial fibrillation (HCC) 11/2020   cardiologist-- primary dr w. barbaraann and followed by atrial clinic;    dx 003/ 2022, atenolol  for rate control and on eliquis    Rectocele    Thrombocythemia 2009   related to CLL, followed by dr lonn   Urinary incontinence    Vaginal atrophy    Past Surgical History:  Procedure Laterality Date   APPENDECTOMY     1980s   CATARACT EXTRACTION W/ INTRAOCULAR LENS IMPLANT Bilateral 2020   RECTOCELE REPAIR N/A 08/25/2021   Procedure: POSTERIOR REPAIR (RECTOCELE);  Surgeon: Jertson, Jill Evelyn, MD;  Location: [facility];  Service: Gynecology;  Laterality: N/A;   TONSILLECTOMY AND ADENOIDECTOMY     age 67   Patient Active Problem List   Diagnosis Date Noted   Severe mitral regurgitation  05/23/2024   Deficiency anemia 04/21/2024   Dehydration 04/21/2024   Acute left-sided low back pain with left-sided sciatica 06/25/2022   H/O rectocele repair 08/25/2021   Secondary hypercoagulable state 12/10/2020   Persistent atrial fibrillation (HCC) 11/29/2020   Hyperlipidemia, unspecified 04/07/2018   CKD (chronic kidney disease), stage III (HCC) 02/09/2018   Rash 02/09/2018   Seborrheic dermatitis 01/08/2017   Former smoker 09/30/2015   Rectocele 03/29/2015   Thrombocytopenia 11/26/2014   CLL (chronic lymphocytic leukemia) (HCC) 11/27/2013   Hyperglycemia 05/08/2013   Essential hypertension, benign 11/07/2012   Obesity, unspecified 11/07/2012    ONSET DATE: May 2025  REFERRING DIAG: Severe muscle deconditioning  THERAPY DIAG:  Muscle weakness (generalized)  Unsteadiness on feet  Difficulty in walking, not elsewhere classified  Rationale for Evaluation and Treatment: Rehabilitation  SUBJECTIVE:  SUBJECTIVE STATEMENT: Patient reports no pain today. No falls or LOB. Daughter presents with patient.   Pt accompanied by: family member (daughter)  PERTINENT HISTORY:  Pt was referred to outpatient PT for muscle deconditioning. Pt experienced a cat bite that required hospitalization while she was in [place]. Daughter states she was quite ill and was on bed rest for 3 weeks, which she states contributed to her decline in function. Prior to hospitalization patient was ambulating with a cane and independent with all activities. Currently she ambulates with a rollator, but would like to work toward ambulating with a cane. Pt enjoys reading and cooking. Her goals are to ambulated with LRAD, navigate stairs with confidence, and increase tolerance for cooking.   PAIN:  Are you having pain?  No  PRECAUTIONS: None  WEIGHT BEARING RESTRICTIONS: No  FALLS: Has patient fallen in last 6 months? Yes. Number of falls 3   LIVING ENVIRONMENT: Lives with: lives with their daughter Lives in: House/apartment Stairs: Yes; two steps at daughters house/ currently ramped Has following equipment at home: Counselling psychologist, Environmental Consultant - 4 wheeled, shower chair, Grab bars, and hospital bed  PLOF: Independent with basic ADLs   PATIENT GOALS: Pt would like to work on getting in and out of chairs, increasing tolerance for cooking, and using LRAD.    OBJECTIVE:  Note: Objective measures were completed at Evaluation unless otherwise noted.   COGNITION: Overall cognitive status: Within functional limits for tasks assessed  LOWER EXTREMITY MMT:    MMT Right Eval Left Eval Right  1/8 Left  1/8  Hip flexion 4+ 4+ 5/5 5/5  Horizontal Hip abduction 3+ 3+ 5/5 5/5  Horizontal Hip adduction 3+ 3+ 5/5 5/5  Hip internal rotation   4+/5 5/5  Hip external rotation   4/5* 5/5  Knee flexion 4+ 4+ 5/5 5/5  Knee extension 4+ 4+ 5/5 5/5  Ankle dorsiflexion 4 4 5/5 5/5  Ankle plantarflexion 3+ 3+    *pain in knee (chronic DJD, edema)   GAIT: Findings: Distance walked: 668ft, Assistive device utilized:Walker - 4 wheeled; 09/28/24: 926ft c 4WW, stops 3x to catch breath all <5sec  FUNCTIONAL TESTS:  5 times sit to stand: 16.95 sec completed with UE support; 09/28/24: 18.09sec hands of knees or hands free Timed up and go (TUG): 23.59 sec completed w/ 5TT; retest pending 10 meter walk test: 16.34 sec; 0.61 m/s completed w/ 5TT; 09/28/24: 0.59m/s 6 minute walk test: completed 5:07 min, 612 ft completed w/ 5TT; 09/28/24: 921ft c 4WW, stops 3x to catch breath all <5sec  Item Test date: 08/07/24  Sitting to standing 2. able to stand using hands after several tries  2. Standing unsupported 4. able to stand safely for 2 minutes  3. Sitting with back unsupported, feet supported 4. able to sit safely and securely  for 2 minutes  4. Standing to sitting 4. sits safely with minimal use of hands  5. Pivot transfer  3. able to transfer safely with definite need of hands  6. Standing unsupported with eyes closed 3. able to stand 10 seconds with supervision  7. Standing unsupported with feet together 1. needs help to attain position but able to stand 15 seconds feet together  8. Reaching forward with outstretched arms while standing 2. can reach forward 5 cm (2 inches)  9. Pick up object from the floor from standing 1. unable to pick up and needs supervision while trying  10. Turning to look behind over left and right  shoulders while standing 1. needs supervision when turning  11. Turn 360 degrees 1. needs close supervision or verbal cuing  12. Place alternate foot on step or stool while standing unsupported 0. needs assistance to keep from falling/unable to try  13. Standing unsupported one foot in front 0. loses balance while stepping or standing  14. Standing on one leg 0. unable to try of needs assist to prevent fall    Total Score 26/56   PATIENT SURVEYS:  ABC scale: 37.5%; 09/28/24: 63.7% (pt requested to not do this survey again, questions are not meaningful.                                                                                                                              TREATMENT DATE: 10/10/24  TA- To improve functional movements patterns for everyday tasks   Sit to stands: 2x10 from chair without UE use.  Ambulate 160 ft with SPC and CGA, , very fatigued by end of lap around PT and OT gym.   In // bars: -one foot per square with UE support 5x  -lateral step two feet per square with UE support   NMR: To facilitate reeducation of movement, balance, posture, coordination, and/or proprioception/kinesthetic sense.  Standing with CGA next to support surface:  Airex pad: static stand 30 seconds x 2 trials, noticeable trembling of ankles/LE's with fatigue and challenge to maintain  stability Airex pad: horizontal head turns 30 seconds scanning room 10x ; cueing for arc of motion  Airex pad: vertical head turns 30 seconds, cueing for arc of motion, noticeable sway with upward gaze increasing demand on ankle righting reaction musculature Airex pad: one foot on 6 step one foot on airex pad, hold position for 30 seconds, switch legs, 2x each LE; Airex pad  6 step 10x each LE step   TE- To improve strength, endurance, mobility, and function of specific targeted muscle groups or improve joint range of motion or improve muscle flexibility Adduction ball squeeze 15x Adduction with IR/ER 15x  LAQ with adduction between feet 15x     PATIENT EDUCATION: Education details: POC Person educated: Patient Education method: Explanation Education comprehension: verbalized understanding   HOME EXERCISE PROGRAM: None    GOALS: Goals reviewed with patient? Yes  SHORT TERM GOALS: Target date: 09/13/2024  Patient will be independent with HEP to improve strength and mobility for better functional independence with ADL's Baseline:Not established Goal status: NOT MET  LONG TERM GOALS: Target date: 10/25/2024  Patient will improve 5xSTS to 12 seconds or less to demonstrate improved strength and balance. Baseline: 16.95 sec with UE support; 18.09sec hands of knees or hands free Goal status: PROGRESSING  2.  Patient will decrease TUG score by 8 seconds or more with LRAD to indicate decreased fall risk and improved transfer ability. Baseline:  23.59 sec completed with 5TT;  Goal status: PROGRESSING  3.  Patient will improve by >1.0 m/s with LRAD for  improved community ambulation and indicate a decreased fall risk. Baseline: 0.61 m/s completed with 5TT; 0.59m/s   Goal status: PROGRESSING  4.  Patient will improve to >800 ft with LRAD for progression toward community ambulator.  Baseline: 5:07 min, 612 ft. Completed with 4WW; 968ft c 4WW, stops 3x to catch breath  all <5sec Goal status: PROGRESSING  5.  Patient will improve BERG by 6 points or more to demonstrate decreased fall risk with functional activities.  Baseline: 26/56; 09/28/24: pending retest  Goal status: INITIAL  6.  Patient will be able to navigate 4 steps with step to pattern using UE support and supervision in order to enter daughter's home.  Baseline: to be assessed at visit 2; 09/28/24: pt reports success in accessing pt's home.  Goal status: MET   ASSESSMENT:  CLINICAL IMPRESSION:.  Patient is highly  motivated throughout session. She is eager to progress her functional independence. She is eager to progress her safe mobility with least assistive device. Unstable surfaces are challenging for patient and an area of focus.  Pt will continue to benefit from skilled therapy to address remaining deficits in order to improve overall QoL and return to PLOF.    OBJECTIVE IMPAIRMENTS: Abnormal gait, decreased activity tolerance, decreased balance, decreased endurance, decreased knowledge of use of DME, and decreased strength.   ACTIVITY LIMITATIONS: carrying, lifting, squatting, stairs, transfers, and reach over head  PARTICIPATION LIMITATIONS: meal prep, cleaning, laundry, and community activity  PERSONAL FACTORS: Age and 3+ comorbidities: CKD, CLL, HTN are also affecting patient's functional outcome.   REHAB POTENTIAL: Good  CLINICAL DECISION MAKING: Evolving/moderate complexity  EVALUATION COMPLEXITY: Moderate  PLAN:  PT FREQUENCY: 2x/week  PT DURATION: 12 weeks  PLANNED INTERVENTIONS: 97110-Therapeutic exercises, 97530- Therapeutic activity, 97112- Neuromuscular re-education, 97535- Self Care, 02859- Manual therapy, [phone]- Gait training, Patient/Family education, Balance training, Stair training, Vestibular training, and DME instructions  PLAN FOR NEXT SESSION:  Endurance and functional strength SPC training for community tasks  Balance activities- SLS   9:29 AM,  10/10/24 Guthrie Lemme  Leopoldo, PT, DPT Physical Therapist - [place] [facility]  Outpatient Physical Therapy- [facility] [phone]       "

## 2024-10-10 ENCOUNTER — Ambulatory Visit

## 2024-10-10 ENCOUNTER — Ambulatory Visit: Admitting: Occupational Therapy

## 2024-10-10 DIAGNOSIS — M6281 Muscle weakness (generalized): Secondary | ICD-10-CM | POA: Diagnosis not present

## 2024-10-10 DIAGNOSIS — R2681 Unsteadiness on feet: Secondary | ICD-10-CM

## 2024-10-10 DIAGNOSIS — R262 Difficulty in walking, not elsewhere classified: Secondary | ICD-10-CM

## 2024-10-10 NOTE — Therapy (Signed)
 " OCCUPATIONAL THERAPY TREATMENT NOTE   Patient Name: Maria Lowe MRN: [id number] DOB:02/15/1931, 89 y.o., female Today's Date: 10/10/2024   REFERRING PROVIDER: Katrinka Senior, MD  END OF SESSION:   OT End of Session - 10/10/24 1112     Visit Number 31    Number of Visits 48    Date for Recertification  10/30/24    OT Start Time 0925    OT Stop Time 1015    OT Time Calculation (min) 50 min    Activity Tolerance Patient tolerated treatment well    Behavior During Therapy [facility] for tasks assessed/performed             Past Medical History:  Diagnosis Date   Anticoagulant long-term use    eliquis -- managed by cardiology   Arthritis    knees   CKD (chronic kidney disease), stage III (HCC)    CLL (chronic lymphocytic leukemia) (HCC) 11/27/2013   oncologist-- dr lonn;  initial dx 03/ 2015, no treatment, active survillance   Essential hypertension    followed by pcp   Full dentures    History of gastric ulcer    stomach ulcer when young   Lymphocytosis 2009   followed oncology   Permanent atrial fibrillation (HCC) 11/2020   cardiologist-- primary dr w. barbaraann and followed by atrial clinic;    dx 003/ 2022, atenolol  for rate control and on eliquis    Rectocele    Thrombocythemia 2009   related to CLL, followed by dr lonn   Urinary incontinence    Vaginal atrophy    Past Surgical History:  Procedure Laterality Date   APPENDECTOMY     1980s   CATARACT EXTRACTION W/ INTRAOCULAR LENS IMPLANT Bilateral 2020   RECTOCELE REPAIR N/A 08/25/2021   Procedure: POSTERIOR REPAIR (RECTOCELE);  Surgeon: Jertson, Jill Evelyn, MD;  Location: [facility];  Service: Gynecology;  Laterality: N/A;   TONSILLECTOMY AND ADENOIDECTOMY     age 38   Patient Active Problem List   Diagnosis Date Noted   Severe mitral regurgitation 05/23/2024   Deficiency anemia 04/21/2024   Dehydration 04/21/2024   Acute left-sided low back pain with left-sided sciatica 06/25/2022    H/O rectocele repair 08/25/2021   Secondary hypercoagulable state 12/10/2020   Persistent atrial fibrillation (HCC) 11/29/2020   Hyperlipidemia, unspecified 04/07/2018   CKD (chronic kidney disease), stage III (HCC) 02/09/2018   Rash 02/09/2018   Seborrheic dermatitis 01/08/2017   Former smoker 09/30/2015   Rectocele 03/29/2015   Thrombocytopenia 11/26/2014   CLL (chronic lymphocytic leukemia) (HCC) 11/27/2013   Hyperglycemia 05/08/2013   Essential hypertension, benign 11/07/2012   Obesity, unspecified 11/07/2012    ONSET DATE: 01/2024  REFERRING DIAG: Right hand weakness  THERAPY DIAG:  Muscle weakness (generalized)  Rationale for Evaluation and Treatment: Rehabilitation  SUBJECTIVE:   SUBJECTIVE STATEMENT:  Pt. reports that she was able to reach towards the back of her head with her right RUE, however had difficulty scratching her head with her right hand. Accompanied by self; with daughter in the reception area  PERTINENT HISTORY:  Pt. was referred for outpatient OT services for right hand pain. Pt. had received a cat bite approximately one week prior to her going on a trip to [place]. She was hospitalized in [place] for one month due to developing a Cat Bite Pasturella Infection. While in the hospital, Pt. reports that she was placed in an arm cast. Pt. reports that while she was in the hospital she was bedbound, was  not allowed to get out of bed for the entire month, and became weak, and deconditioned. Pt. Reports that she does not know why she was placed in a cast. [place] the [place] Pt. started home health OT and PT services, however OT services were limited due to staffing constraints. Pt. was then referred to outpatient OT services. PMHx includes: Severe mitral valve regurgitation, pericadrial, and pleural effusions, A-Fib, Chronic Lymphocytic Anemia, multifactorial anemia and CKD stage III, Diverticulosis, constipation, HTN, Hyperlipidemia,  Prediabetes.  PRECAUTIONS: None  WEIGHT BEARING RESTRICTIONS: No  PAIN:  Are you having pain?  No pain  FALLS: Has patient fallen in last 6 months? 1 fall in the last 6 months  LIVING ENVIRONMENT: Lives with: Resides with her daughter Lives in: House- one level Stairs: ramped entrance Has following equipment at home: Counselling psychologist, Environmental Consultant - 4 wheeled, shower chair, bed side commode, and Grab bars, hospital bed, elevated toilet  PLOF: Independent, living alone, managing a household-per report  PATIENT GOALS:  To be able to cut her meat  OBJECTIVE:  Note: Objective measures were completed at Evaluation unless otherwise noted.  HAND DOMINANCE: Right  ADLs:  Transfers/ambulation related to ADLs: Supervision with a rollator walker Eating: Pt. has difficulty with using utensils with the right hand, often bringing her head down to meet hand/utensil when eating. Pt. Has  weakness with bringing the cup to her mouth. Difficulty with cutting food. Grooming: Difficulty sustaining the BUE's in up long enough to perform hair care. Independent with oral care,  UB Dressing: Independent, requires increased time to complete. Pt. Does endorse having difficulty reaching up to put a shirt on over her head. LB Dressing:Reports being able to perform LE dressing, Uses slide-on footwear.  Toileting:Pt. Reports independently being able to perform toileting care needs using her left hand. (Of note, Pt. Reports that she has always used her left hand to perform toilet hygiene care.) Bathing: Independent, daughter provides supervision from outside the shower curtain. Daughter assists with drying her back due to limited reach Tub Shower transfers: Daughter provides supervision tub/shower t/f's   IADLs: Light housekeeping: Pt. reports that she has resumed some light housekeeping tasks, however this has been limited. Pt. Reports that she does not vacuum, or sweep due to rollator use. Meal Prep: Pt. Has  resumed weekly cooking on Sundays. Reports having difficulty cutting onions, as well as difficulty holding and using a beater. Pt. Reports that she uses a modified technique to hold kitchen/cooking utensils. Community mobility: Pt. Relies on family  and friends for transportation Medication management: Pt. Has difficulty manipulating medication efficiently with the right hand Handwriting: 50% legible for name only Leisure/Hobbies: Loves to cook; enjoys reading , and working crossword puzzles.  MOBILITY STATUS:  Supervision using a rollator  FUNCTIONAL OUTCOME MEASURES: 05/30/24: MAM-20: 11/20 items were completed. The following items Pt. indicated as a 2 -very hard to do: wringing out a towel, opening a wide mouth bottle-previously opened, cutting meat on a plate, tying shoe laces, zipping a jacket, and squeezing toothpaste on a toothbrush. The following items Pt. indicated as a 1-cannot do: opening a medication bottle with a childproof cap, cutting nails with a clipper, buttoning clothing, picking up a 1/2 full water pitcher, and writing 3-4 lines legibly.  UPPER EXTREMITY ROM:     ROM Right eval Right 06/27/24 Right 08/07/24 Right 10/04/24 Left eval Left 06/27/24 Left 08/07/24 Left 10/04/24  Shoulder flexion 130(150) Scaption 134(150) 144(150) 147(150)  100 scaption 100 scaption (120)  flexion 100 scaption (124) flexion  Shoulder abduction 82(120) 890(879) 881(879) 130(132) 72(102) 892(887) 892(881) 891(875)  Shoulder adduction          Shoulder extension          Shoulder internal rotation          Shoulder external rotation          Elbow flexion [facility] WFL 148 WFL WFL WFL  Elbow extension WFL WFL   -60(-28) -35(-18) -26(-13) -22(-13)  Wrist flexion 26(60) 30(60) 36(60) 40(60) WFL WFL 60(70) 70  Wrist extension 44(62) 46(62) 52(62) 64(64) WFL WFL 70 70  Wrist ulnar deviation          Wrist radial deviation          Wrist pronation          Wrist supination          (Blank  rows = not tested)  Eval: Digit flexion to the [facility]:    Right: 2nd: 5cm(2cm) 3rd: 0cm  4th: 0cm 5th: 5cm(3cm)     Left: 2nd: 1cm(1cm) 3rd: 2cm(0cm) 4th: 4cm(0cm) 5th: 5cm(3cm)    06/27/24:  Digit flexion to the [facility]:    Right: 2nd: 4.5cm(2cm) 3rd: 2.5cm(0cm)  4th: 2cm (0cm) 5th: 5cm(2cm)     Left: 2nd: 1cm(1cm) 3rd: 1.41m(1cm) 4th: 1.5cm(1cm) 5th: 5cm(1.5cm)   08/07/24:  Digit flexion to the [facility]:    Right: 2nd: 3.5cm(1.5cm) 3rd: 0cm(0cm)  4th: 1.5cm (0cm) 5th: 5cm(1.5cm)     Left: 2nd: 0cm(0cm) 3rd: o)m(0cm) 4th: 0cm(0cm) 5th: 4.5cm(1.5cm)  10/05/23  Digit flexion to the [facility]:    Right: 2nd: 3.0cm(1.0cm) 3rd: 0cm(0cm)  4th: 1.5cm (0cm) 5th: 5cm(1 cm)     Left: 2nd: 0cm(0cm) 3rd: o)m(0cm) 4th: 0cm(0cm) 5th: 4.5cm(2.5cm)  UPPER EXTREMITY MMT:     MMT Right eval Right  06/27/24 Right 08/07/24 Right 10/04/24 Left eval Left 06/27/24 Left 08/07/24 Left 10/04/24  Shoulder flexion  3+/5 4-/5 4/5  3-/5 3-/5 3-/5  Shoulder abduction 3-/5 3+/5 3+/5 3+/5 3-/5 3+/5 3+/5 4-/5  Shoulder adduction          Shoulder extension          Shoulder internal rotation          Shoulder external rotation          Middle trapezius          Lower trapezius          Elbow flexion 4/5 4/5 4/5 4+/5 4/5 4/5 4/5 4+/5  Elbow extension 4/5 4/5 4/5 4/5 2+/5 3-/5 3-/5 3-/5  Wrist flexion 3-/5 3-/5 3-/5 3-/5 4-/5 4-/5 4-/5 4+/5  Wrist extension 3-/5 3/5 3+/5 3+/5 4-/5 4-/5 4-/5 4/5  Wrist ulnar deviation          Wrist radial deviation          Wrist pronation          Wrist supination          (Blank rows = not tested)  HAND FUNCTION: 05/30/24: Grip strength: Right: 8 lbs; Left: 12 lbs, Lateral pinch: Right: 4 lbs, Left: 3 lbs, and 3 point pinch: Right: 3 lbs, Left: 3 lbs  06/27/24: Grip strength: Right: 11 lbs; Left: 12 lbs, Lateral pinch: Right: 4 lbs, Left: 3 lbs, and 3 point pinch: Right: 3 lbs, Left: 3 lbs  08/07/24: Grip strength: Right: 15 lbs; Left: 18 lbs, Lateral pinch: Right: 10 lbs, Left:  9 lbs, and 3 point pinch: Right: 5 lbs, Left: 3 lbs  10/04/24: Grip strength: Right: 19 lbs; Left: 19 lbs, Lateral pinch: Right: 10 lbs, Left: 5 lbs, and 3 point pinch: Right: 6 lbs, Left: 5 lbs     COORDINATION:  COORDINATION: Eval:   9 Hole Peg test: Right: 48 sec; Left: 49 sec  06/27/24:  9 Hole Peg test: Right: 33 sec; Left: 49 sec  08/07/24:   9 Hole Peg test: Right: 31 sec; Left: 44 sec.  10/04/24  9 Hole Peg test: Right: 37 sec; Left: 34 sec.  SENSATION: TBD   EDEMA: N/A    COGNITION: Overall cognitive status: Within functional limits for tasks assessed  VISION: Subjective report: No change from baseline   PERCEPTION: WFL                                                                                                                TREATMENT DATE: 10/10/24     Therapeutic Ex.:    -Performed AROM, followed by PROM to the end range for bilateral shoulder flexion, abduction, external rotation, horizontal abduction. Emphasis was placed on slow prolonged gentle stretching 2/2 stiffness and soreness. -Pt. tolerated bilateral AROM/AAROM/PROM for bilateral 5th digit MP, PIP, and DIP flexion to prepare for formulating a composite fist. -Right 2nd through 5th digit MP extension at tabletop surface. -Performed BUE strengthening using a 1# dowel ex. 2/2 to weakness. Bilateral shoulder flexion for 1 set 5-10 reps each with cues, and support proximally increased support required on the left -Performed BUE strengthening using 2# hand weights for 5 reps, followed by 1# for 5 reps for elbow flexion, and extension and  forearm supination/pronation, and 1 # for 10 reps each wrist flexion/extension, and radial deviation.   Therapeutic Activities:   Progressive gross grip strengthening with 11.2 # of grip strength resistive force with the right hand, and 6.6# with the left hand. Pt. Worked on placing the pegs into the container placed at each side of the pegboard.   PATIENT  EDUCATION: Education details: digit ROM, UE strengthening Person educated: Patient Education method: Explanation, Demonstration, Tactile cues, and Verbal cues Education comprehension: verbalized understanding, returned demonstration, verbal cues required, tactile cues required, and needs further education  HOME EXERCISE PROGRAM:    -Bilateral elbow flexion, and extension with yellow theraband.  -Yellow theraputty strengthening  following a HEP through [employer]. -contrasting heat 3 min., followed by 1 min. Cold  for 1 min. Ending with heat for 3 min. To the right hand. (Pt. Prefers just heat at home), moist heat to the left hand. -Massage to the right hand  -Right hand digit extension with the hand flat at the tabletop surface -Blue resistive foam block for Bilateral gross gripping, lateral pinch, and 3pt. Pinch strength -UE shoulder stabilization in supine with the shoulder flexed to 90 degrees with the elbow extended-formulating the alphabet    GOALS: Goals reviewed with patient? Yes  SHORT TERM GOALS: Target date: 07/07/2024     Pt. Will be independent with HEPs for UE strength, and coordination Baseline: 10/04/24: Independent; continue/ongoing 06/27/24: Independent;  continue Eval: No current HEP Goal status: Ongoing   LONG TERM GOALS: Target date: 08/18/2024   To reduce pain by 3 grades on the pain scale in preparation for functional hand use during ADLs/IADLs Baseline: 06/27/24: 0/10 pain in the right hand. Pt. reports discomfort intermittently with left 5th digit flexion. Eval:  5/10 pain in the right hand Goal status: Progressed; Achieved  2.  Pt. Will formulate a full composite fist in preparation for securely holding kitchen/cooking utensils. Baseline:10/04/24: Digit flexion to the [facility]: Right: 2nd: 3.0cm(1.0cm) 3rd: 0cm(0cm)  4th: 1.5cm (0cm) 5th: 5cm(1 cm) Left: 2nd: 0cm(0cm) 3rd: o)m(0cm) 4th: 0cm(0cm) 5th: 4.5cm(cm) 08/07/24: Digit flexion to the [facility]: Right: 2nd:  3.5cm(1.5cm) 3rd: 0cm(0cm)  4th: 1.5cm (0cm) 5th: 5cm(1.5cm) Left: 2nd: 0cm(0cm) 3rd: o)m(0cm) 4th: 0cm(0cm) 5th: 4.5cm(1.5cm) 06/27/24: Digit flexion to the [facility]: Right: 2nd: 4.5cm(2cm) 3rd: 2.5cm(0cm)  4th: 2cm (0cm) 5th: 5cm(2cm) Left: 2nd: 1cm(1cm) 3rd: 1.8m(1cm) 4th: 1.5cm(1cm) 5th: 5cm(1.5cm) Pt. Had difficulty with holding and using utensils during self-feeding, Pt. Is improving with larger cooking utensilsEval: Digit flexion to the [facility]: 2nd: 5cm(2cm) 3rd: 0cm  4th: 0cm 5th: 5cm(3cm) Left: 2nd: 1cm(1cm) 3rd: 2cm(0cm) 4th: 4cm(0cm) 5th: 5cm(3cm) Pt, has difficulty securely holding cooking utensils in her right hand Goal status: Progressing, Ongoing  3.  Pt. Will efficiently perform hand to mouth patterns during self-feeding with modified independence using her right hand. Baseline: 10/04/24: Pt. Is able to hold and use a knife with her right hand, however  continues to have difficulty bringing her spoon all the way to her mouth, continues to need to bring her mouth down to her spoon.08/07/24: Pt. continues to have difficulty bringing her spoon all the way to her mouth, continues to need to bring her mouth down to her spoon.06/27/24: Pt. continues to have difficulty bringing her spoon all the way to her mouth, often needing to bring her mouth down to her spoon. Eval: Pt. Has difficulty completing hand to mouth patterns, often bringing her mouth down to meet the spoon 2/2 weakness in the RUE/hand Goal status: Progressing, Ongoing  4.  Pt. Will cut meat on her plate efficiently with modified independence Baseline: 08/07/24: Pt. reports that she is now able to cut meat. 06/27/24: Pt. Is improving with cutting meat, over continues to have difficulty cutting meat. Eval: Pt. Has difficulty cutting meat Goal status: Achieved  5.  Pt. Will increase bilateral shoulder abduction by 10 degrees be able to efficiently perform hair care Baseline:  10/04/24:  Shoulder abduction: Right: 130(132), Left: 891(875)  08/07/24:  Shoulder abduction: Right: 118(120), Left: 107(112) Pt. is now able to brush reach up further to the back of her head.06/27/24: Shoulder abduction: Right: 109(120), Left: 107(112) Pt. is able to reach up further to her head, however has difficulty sustaining BUE in elevation while performing hair care. Eval: shoulder abduction: Right: 82(120), Left: 72(102) Goal status: Progressing  6.  Pt. Will write one sentence efficiently  with 75% in preparation for being able to fill in crossword puzzles. Baseline: 10/04/24: Limited legibility formulating sentences.08/07/24: Continue 06/27/24: Name only with 90% legibility; one sentence completed with 50% legibility Eval: writing legibility: 75% for name only. Goal status: Progressing/Ongoing  7. Pt. Will improve left elbow extension to be able to efficiently reach out for items on the table.    Baseline: 10/04/24: Elbow extension: -22(-13) 08/07/25: Elbow extension: -26(-13)    Goal status: Progressing/ongoing    8. Pt. Will improve BUE strength to assist with ADLs, and IADL tasks  Baseline:  10/04/24: UE strength: Right Shoulder flexion: 4/5, abduction: 3+/5, elbow flexion: 4+/5, extension: 4/5, wrist flexion: 3-/5, extension: 3+/5; Left Shoulder flexion: 3-/5, abduction: 4-/5, elbow flexion: 4+/5, extension: 3-/5, wrist flexion: 4+/5, extension: 4/5 08/07/24: UE strength: Right Shoulder flexion: 4-/5, abduction: 3+/5, elbow flexion: 4/5, extension: 4/5, wrist flexion: 3-/5, extension: 3+/5; Left Shoulder flexion: 3-/5, abduction: 3+/5, elbow flexion: 4/5, extension: 3-/5, wrist flexion: 4-/5, extension: 4-/5  Goal status: progressing/Ongoing  9. Pt. will improve bilateral grip strength by 5# to be able to hold items more securely for ADLs/IADLs  Baseline: 10/04/24: Grip strength: Right: 19 lbs; Left: 19 lbs 08/07/24: Grip strength: Right: 15# Left: 18#  Goal status: Progressing/Ongoing  10.  Pt. Will improve bilateral [facility] skills in order to be  able to manipulate small objects  Baseline: 10/04/24: 9 Hole Peg test: Right: 37 sec; Left: 34 sec.08/07/24: 9 Hole Peg test: Right: 31 sec; Left: 44 sec.   Goal status:  Progressing/Ongoing   ASSESSMENT:  CLINICAL IMPRESSION:  Pt. is able to reach further up to her head with her RUE however has difficulty using her right hand to scratch an itch on the back of her head. Pt. reports being unable to knock with her right hand. Pt. required support proximally during the dowel exercises. Pt. required the 2# hand weights to be adjusted to 1# after 5 reps.  Pt. Continues to present with difficulty extending her digits with the right hand, and completing full bilateral 5th digit flexion. Pt. continues to benefit from OT services to work on improving BUE functioning in order to increase engagement of the UEs during ADLs, and IADL tasks.    PERFORMANCE DEFICITS: in functional skills including ADLs, IADLs, coordination, dexterity, ROM, strength, pain, Fine motor control, Gross motor control, and UE functional use, , and psychosocial skills including coping strategies, environmental adaptation, habits, interpersonal interactions, and routines and behaviors.   IMPAIRMENTS: are limiting patient from ADLs, IADLs, and leisure.   CO-MORBIDITIES: may have co-morbidities  that affects occupational performance. Patient will benefit from skilled OT to address above impairments and improve overall function.  MODIFICATION OR ASSISTANCE TO COMPLETE EVALUATION: Min-Moderate modification of tasks or assist with assess necessary to complete an evaluation.  OT OCCUPATIONAL PROFILE AND HISTORY: Detailed assessment: Review of records and additional review of physical, cognitive, psychosocial history related to current functional performance.  CLINICAL DECISION MAKING: Moderate - several treatment options, min-mod task modification necessary  REHAB POTENTIAL: Good  EVALUATION COMPLEXITY: Moderate   PLAN:  OT  FREQUENCY: 2x/week  OT DURATION: 12 weeks  PLANNED INTERVENTIONS: 97168 OT Re-evaluation, 97535 self care/ADL training, 02889 therapeutic exercise, 97530 therapeutic activity, 97112 neuromuscular re-education, 97140 manual therapy, 97018 paraffin, 02989 moist heat, 97010 cryotherapy, 97034 contrast bath, 97760 Orthotic Initial, 97763 Orthotic/Prosthetic subsequent, passive range of motion, balance training, functional mobility training, energy conservation, coping strategies training, patient/family education, and DME and/or AE instructions  RECOMMENDED OTHER SERVICES: PT for balance  CONSULTED AND AGREED WITH PLAN OF CARE: Patient  PLAN FOR NEXT SESSION:  Assess sensation, and hand function skills at the next visit, as well administer the MAM-20 outcome measure; treatment  Richardson Otter, MS, OTR/L  10/10/2024, 11:19 AM           "

## 2024-10-12 ENCOUNTER — Ambulatory Visit: Admitting: Occupational Therapy

## 2024-10-12 ENCOUNTER — Ambulatory Visit

## 2024-10-16 ENCOUNTER — Ambulatory Visit: Admitting: Occupational Therapy

## 2024-10-16 ENCOUNTER — Ambulatory Visit

## 2024-10-18 ENCOUNTER — Ambulatory Visit: Admitting: Occupational Therapy

## 2024-10-18 ENCOUNTER — Ambulatory Visit

## 2024-10-23 ENCOUNTER — Ambulatory Visit

## 2024-10-23 ENCOUNTER — Ambulatory Visit: Admitting: Occupational Therapy

## 2024-10-25 ENCOUNTER — Ambulatory Visit

## 2024-10-25 ENCOUNTER — Ambulatory Visit: Admitting: Occupational Therapy

## 2024-10-30 ENCOUNTER — Ambulatory Visit: Admitting: Family Medicine

## 2024-10-30 ENCOUNTER — Ambulatory Visit: Admitting: Occupational Therapy

## 2024-10-30 ENCOUNTER — Ambulatory Visit: Attending: Family Medicine

## 2024-11-01 ENCOUNTER — Ambulatory Visit: Admitting: Occupational Therapy

## 2024-11-01 ENCOUNTER — Ambulatory Visit

## 2024-11-06 ENCOUNTER — Ambulatory Visit: Admitting: Occupational Therapy

## 2024-11-06 ENCOUNTER — Ambulatory Visit

## 2024-11-08 ENCOUNTER — Ambulatory Visit

## 2024-11-08 ENCOUNTER — Ambulatory Visit: Admitting: Occupational Therapy

## 2024-11-13 ENCOUNTER — Ambulatory Visit: Admitting: Occupational Therapy

## 2024-11-13 ENCOUNTER — Ambulatory Visit

## 2024-11-15 ENCOUNTER — Ambulatory Visit: Admitting: Physical Therapy

## 2024-11-15 ENCOUNTER — Ambulatory Visit

## 2024-11-20 ENCOUNTER — Ambulatory Visit: Admitting: Occupational Therapy

## 2024-11-20 ENCOUNTER — Ambulatory Visit

## 2024-11-22 ENCOUNTER — Ambulatory Visit: Admitting: Occupational Therapy

## 2024-11-22 ENCOUNTER — Ambulatory Visit

## 2024-11-27 ENCOUNTER — Ambulatory Visit: Admitting: Occupational Therapy

## 2024-11-27 ENCOUNTER — Ambulatory Visit

## 2024-11-29 ENCOUNTER — Ambulatory Visit

## 2024-11-29 ENCOUNTER — Ambulatory Visit: Admitting: Occupational Therapy

## 2025-02-27 ENCOUNTER — Encounter: Admitting: Family Medicine

## 2025-05-24 ENCOUNTER — Ambulatory Visit
